# Patient Record
Sex: Male | Born: 1990 | Race: Black or African American | Hispanic: No | Marital: Single | Smoking: Current every day smoker
Health system: Southern US, Community
[De-identification: ages and names within clinical notes are randomized; demographics above are authoritative.]

## PROBLEM LIST (undated history)

## (undated) DIAGNOSIS — I480 Paroxysmal atrial fibrillation: Secondary | ICD-10-CM

## (undated) DIAGNOSIS — G4733 Obstructive sleep apnea (adult) (pediatric): Secondary | ICD-10-CM

## (undated) DIAGNOSIS — F1490 Cocaine use, unspecified, uncomplicated: Secondary | ICD-10-CM

## (undated) DIAGNOSIS — I739 Peripheral vascular disease, unspecified: Secondary | ICD-10-CM

## (undated) DIAGNOSIS — N1832 Chronic kidney disease, stage 3b: Secondary | ICD-10-CM

## (undated) DIAGNOSIS — R600 Localized edema: Secondary | ICD-10-CM

## (undated) DIAGNOSIS — I5032 Chronic diastolic (congestive) heart failure: Secondary | ICD-10-CM

## (undated) DIAGNOSIS — L0390 Cellulitis, unspecified: Secondary | ICD-10-CM

## (undated) DIAGNOSIS — I10 Essential (primary) hypertension: Secondary | ICD-10-CM

## (undated) DIAGNOSIS — E669 Obesity, unspecified: Secondary | ICD-10-CM

## (undated) DIAGNOSIS — I890 Lymphedema, not elsewhere classified: Secondary | ICD-10-CM

## (undated) HISTORY — DX: Lymphedema, not elsewhere classified: I89.0

## (undated) HISTORY — DX: Chronic kidney disease, stage 3b: N18.32

## (undated) HISTORY — DX: Obstructive sleep apnea (adult) (pediatric): G47.33

## (undated) HISTORY — DX: Cellulitis, unspecified: L03.90

## (undated) HISTORY — DX: Chronic diastolic (congestive) heart failure: I50.32

## (undated) HISTORY — DX: Localized edema: R60.0

## (undated) HISTORY — DX: Paroxysmal atrial fibrillation: I48.0

## (undated) HISTORY — DX: Cocaine use, unspecified, uncomplicated: F14.90

---
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
## 2008-10-14 ENCOUNTER — Emergency Department (HOSPITAL_COMMUNITY): Admission: EM | Admit: 2008-10-14 | Discharge: 2008-10-14 | Payer: Self-pay | Admitting: Emergency Medicine

## 2008-10-15 ENCOUNTER — Emergency Department (HOSPITAL_COMMUNITY): Admission: EM | Admit: 2008-10-15 | Discharge: 2008-10-15 | Payer: Self-pay | Admitting: Emergency Medicine

## 2009-02-20 ENCOUNTER — Emergency Department (HOSPITAL_COMMUNITY): Admission: EM | Admit: 2009-02-20 | Discharge: 2009-02-20 | Payer: Self-pay | Admitting: Emergency Medicine

## 2010-05-18 LAB — URINALYSIS, ROUTINE W REFLEX MICROSCOPIC
Glucose, UA: NEGATIVE mg/dL
Protein, ur: NEGATIVE mg/dL
pH: 6 (ref 5.0–8.0)

## 2010-05-18 LAB — URINE MICROSCOPIC-ADD ON

## 2012-07-03 ENCOUNTER — Emergency Department (HOSPITAL_COMMUNITY): Payer: Self-pay

## 2012-07-03 ENCOUNTER — Encounter (HOSPITAL_COMMUNITY): Payer: Self-pay | Admitting: Emergency Medicine

## 2012-07-03 ENCOUNTER — Emergency Department (HOSPITAL_COMMUNITY)
Admission: EM | Admit: 2012-07-03 | Discharge: 2012-07-03 | Disposition: A | Discharge status: Home / Self Care | Payer: No Typology Code available for payment source | Attending: Emergency Medicine | Admitting: Emergency Medicine

## 2012-07-03 DIAGNOSIS — S99929A Unspecified injury of unspecified foot, initial encounter: Secondary | ICD-10-CM | POA: Insufficient documentation

## 2012-07-03 DIAGNOSIS — S6990XA Unspecified injury of unspecified wrist, hand and finger(s), initial encounter: Secondary | ICD-10-CM | POA: Insufficient documentation

## 2012-07-03 DIAGNOSIS — S0990XA Unspecified injury of head, initial encounter: Secondary | ICD-10-CM

## 2012-07-03 DIAGNOSIS — S0591XA Unspecified injury of right eye and orbit, initial encounter: Secondary | ICD-10-CM

## 2012-07-03 DIAGNOSIS — S59909A Unspecified injury of unspecified elbow, initial encounter: Secondary | ICD-10-CM | POA: Insufficient documentation

## 2012-07-03 DIAGNOSIS — Y9389 Activity, other specified: Secondary | ICD-10-CM | POA: Insufficient documentation

## 2012-07-03 DIAGNOSIS — S058X9A Other injuries of unspecified eye and orbit, initial encounter: Secondary | ICD-10-CM | POA: Insufficient documentation

## 2012-07-03 DIAGNOSIS — S8990XA Unspecified injury of unspecified lower leg, initial encounter: Secondary | ICD-10-CM | POA: Insufficient documentation

## 2012-07-03 DIAGNOSIS — S0531XA Ocular laceration without prolapse or loss of intraocular tissue, right eye, initial encounter: Secondary | ICD-10-CM

## 2012-07-03 DIAGNOSIS — S0510XA Contusion of eyeball and orbital tissues, unspecified eye, initial encounter: Secondary | ICD-10-CM | POA: Insufficient documentation

## 2012-07-03 MED ORDER — ACETAMINOPHEN 325 MG PO TABS: 650.0000 mg | ORAL_TABLET | Freq: Once | ORAL | Status: DC

## 2012-07-03 MED ORDER — HYDROCODONE-ACETAMINOPHEN 5-325 MG PO TABS: 1.0000 | ORAL_TABLET | Freq: Four times a day (QID) | ORAL | Status: DC | PRN

## 2012-07-03 MED ORDER — HYDROMORPHONE HCL PF 1 MG/ML IJ SOLN
1.0000 mg | Freq: Once | INTRAMUSCULAR | Status: AC
  Administered 2012-07-03: 1 mg via INTRAMUSCULAR
  Filled 2012-07-03: qty 1

## 2012-07-03 MED ORDER — TETANUS-DIPHTH-ACELL PERTUSSIS 5-2.5-18.5 LF-MCG/0.5 IM SUSP
0.5000 mL | Freq: Once | INTRAMUSCULAR | Status: DC
  Filled 2012-07-03: qty 0.5
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
## 2012-07-03 NOTE — ED Notes (Signed)
Patient again refused Tylenol

## 2012-07-03 NOTE — ED Notes (Signed)
Patient transported to X-ray 
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
## 2012-07-04 NOTE — ED Provider Notes (Signed)
I saw and evaluated the patient, reviewed the resident's note and I agree with the findings and plan.  see my attached note  Vida Roller, MD 07/04/12 [phone]

## 2012-07-12 ENCOUNTER — Encounter (HOSPITAL_COMMUNITY): Payer: Self-pay | Admitting: Nurse Practitioner

## 2012-07-12 ENCOUNTER — Emergency Department (HOSPITAL_COMMUNITY): Payer: Self-pay

## 2012-07-12 ENCOUNTER — Emergency Department (HOSPITAL_COMMUNITY)
Admission: EM | Admit: 2012-07-12 | Discharge: 2012-07-12 | Disposition: A | Discharge status: Home / Self Care | Payer: Self-pay | Attending: Emergency Medicine | Admitting: Emergency Medicine

## 2012-07-12 DIAGNOSIS — F17200 Nicotine dependence, unspecified, uncomplicated: Secondary | ICD-10-CM | POA: Insufficient documentation

## 2012-07-12 DIAGNOSIS — Z79899 Other long term (current) drug therapy: Secondary | ICD-10-CM | POA: Insufficient documentation

## 2012-07-12 DIAGNOSIS — R0602 Shortness of breath: Secondary | ICD-10-CM | POA: Insufficient documentation

## 2012-07-12 DIAGNOSIS — R252 Cramp and spasm: Secondary | ICD-10-CM | POA: Insufficient documentation

## 2012-07-12 LAB — CBC
HCT: 42.8 % (ref 39.0–52.0)
MCV: 83.6 fL (ref 78.0–100.0)
RDW: 13.7 % (ref 11.5–15.5)
WBC: 4.1 10*3/uL (ref 4.0–10.5)

## 2012-07-12 LAB — BASIC METABOLIC PANEL
BUN: 12 mg/dL (ref 6–23)
CO2: 25 mEq/L (ref 19–32)
Chloride: 103 mEq/L (ref 96–112)
Creatinine, Ser: 0.9 mg/dL (ref 0.50–1.35)
GFR calc Af Amer: >90 mL/min (ref >90)
Glucose, Bld: 90 mg/dL (ref 70–99)

## 2012-07-12 MED ORDER — IBUPROFEN 800 MG PO TABS: 800.0000 mg | ORAL_TABLET | Freq: Three times a day (TID) | ORAL | Status: DC

## 2012-07-12 MED ORDER — MORPHINE SULFATE 4 MG/ML IJ SOLN
4.0000 mg | Freq: Once | INTRAMUSCULAR | Status: AC
  Administered 2012-07-12: 4 mg via INTRAVENOUS
  Filled 2012-07-12: qty 1

## 2012-07-12 MED ORDER — METHOCARBAMOL 500 MG PO TABS: 500.0000 mg | ORAL_TABLET | Freq: Two times a day (BID) | ORAL | Status: DC

## 2012-07-12 MED ORDER — HYDROCODONE-ACETAMINOPHEN 5-325 MG PO TABS: 2.0000 | ORAL_TABLET | Freq: Four times a day (QID) | ORAL | Status: DC | PRN
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
## 2012-07-12 NOTE — ED Provider Notes (Signed)
Medical screening examination/treatment/procedure(s) were performed by non-physician practitioner and as supervising physician I was immediately available for consultation/collaboration.  Lunna Vogelgesang M Perian Tedder, MD 07/12/12 1624 

## 2013-08-18 ENCOUNTER — Encounter (HOSPITAL_COMMUNITY): Payer: Self-pay | Admitting: Emergency Medicine

## 2013-08-18 ENCOUNTER — Emergency Department (HOSPITAL_COMMUNITY)
Admission: EM | Admit: 2013-08-18 | Discharge: 2013-08-18 | Disposition: A | Discharge status: Home / Self Care | Payer: Self-pay | Attending: Emergency Medicine | Admitting: Emergency Medicine

## 2013-08-18 DIAGNOSIS — IMO0002 Reserved for concepts with insufficient information to code with codable children: Secondary | ICD-10-CM | POA: Insufficient documentation

## 2013-08-18 DIAGNOSIS — M546 Pain in thoracic spine: Secondary | ICD-10-CM

## 2013-08-18 DIAGNOSIS — Y929 Unspecified place or not applicable: Secondary | ICD-10-CM | POA: Insufficient documentation

## 2013-08-18 DIAGNOSIS — X500XXA Overexertion from strenuous movement or load, initial encounter: Secondary | ICD-10-CM | POA: Insufficient documentation

## 2013-08-18 DIAGNOSIS — Y939 Activity, unspecified: Secondary | ICD-10-CM | POA: Insufficient documentation

## 2013-08-18 DIAGNOSIS — F17200 Nicotine dependence, unspecified, uncomplicated: Secondary | ICD-10-CM | POA: Insufficient documentation

## 2013-08-18 DIAGNOSIS — Y990 Civilian activity done for income or pay: Secondary | ICD-10-CM | POA: Insufficient documentation

## 2013-08-18 MED ORDER — CYCLOBENZAPRINE HCL 10 MG PO TABS: 10.0000 mg | ORAL_TABLET | Freq: Two times a day (BID) | ORAL | Status: DC | PRN

## 2013-08-18 MED ORDER — CYCLOBENZAPRINE HCL 10 MG PO TABS
10.0000 mg | ORAL_TABLET | Freq: Once | ORAL | Status: AC
  Administered 2013-08-18: 10 mg via ORAL
  Filled 2013-08-18: qty 1

## 2013-08-18 NOTE — Discharge Instructions (Signed)
Back Pain, Adult °Low back pain is very common. About 1 in 5 people have back pain. The cause of low back pain is rarely dangerous. The pain often gets better over time. About half of people with a sudden onset of back pain feel better in just 2 weeks. About 8 in 10 people feel better by 6 weeks.  °CAUSES °Some common causes of back pain include: °· Strain of the muscles or ligaments supporting the spine. °· Wear and tear (degeneration) of the spinal discs. °· Arthritis. °· Direct injury to the back. °DIAGNOSIS °Most of the time, the direct cause of low back pain is not known. However, back pain can be treated effectively even when the exact cause of the pain is unknown. Answering your caregiver's questions about your overall health and symptoms is one of the most accurate ways to make sure the cause of your pain is not dangerous. If your caregiver needs more information, he or she may order lab work or imaging tests (X-rays or MRIs). However, even if imaging tests show changes in your back, this usually does not require surgery. °HOME CARE INSTRUCTIONS °For many people, back pain returns. Since low back pain is rarely dangerous, it is often a condition that people can learn to manage on their own.  °· Remain active. It is stressful on the back to sit or stand in one place. Do not sit, drive, or stand in one place for more than 30 minutes at a time. Take short walks on level surfaces as soon as pain allows. Try to increase the length of time you walk each day. °· Do not stay in bed. Resting more than 1 or 2 days can delay your recovery. °· Do not avoid exercise or work. Your body is made to move. It is not dangerous to be active, even though your back may hurt. Your back will likely heal faster if you return to being active before your pain is gone. °· Pay attention to your body when you  bend and lift. Many people have less discomfort when lifting if they bend their knees, keep the load close to their bodies, and  avoid twisting. Often, the most comfortable positions are those that put less stress on your recovering back. °· Find a comfortable position to sleep. Use a firm mattress and lie on your side with your knees slightly bent. If you lie on your back, put a pillow under your knees. °· Only take over-the-counter or prescription medicines as directed by your caregiver. Over-the-counter medicines to reduce pain and inflammation are often the most helpful. Your caregiver may prescribe muscle relaxant drugs. These medicines help dull your pain so you can more quickly return to your normal activities and healthy exercise. °· Put ice on the injured area. °¨ Put ice in a plastic bag. °¨ Place a towel between your skin and the bag. °¨ Leave the ice on for 15-20 minutes, 03-04 times a day for the first 2 to 3 days. After that, ice and heat may be alternated to reduce pain and spasms. °· Ask your caregiver about trying back exercises and gentle massage. This may be of some benefit. °· Avoid feeling anxious or stressed. Stress increases muscle tension and can worsen back pain. It is important to recognize when you are anxious or stressed and learn ways to manage it. Exercise is a great option. °SEEK MEDICAL CARE IF: °· You have pain that is not relieved with rest or medicine. °· You have pain that does not improve in 1 week. °· You have new symptoms. °· You are generally not feeling well. °SEEK   IMMEDIATE MEDICAL CARE IF:   You have pain that radiates from your back into your legs.  You develop new bowel or bladder control problems.  You have unusual weakness or numbness in your arms or legs.  You develop nausea or vomiting.  You develop abdominal pain.  You feel faint. Document Released: 01/28/2005 Document Revised: 07/30/2011 Document Reviewed: 06/18/2010 [facility] Patient Information 2015 [place], [place]. This information is not intended to replace advice given to you by your health care provider. Make sure you  discuss any questions you have with your health care provider.  Back Exercises These exercises may help you when beginning to rehabilitate your injury. Your symptoms may resolve with or without further involvement from your physician, physical therapist or athletic trainer. While completing these exercises, remember:   Restoring tissue flexibility helps normal motion to return to the joints. This allows healthier, less painful movement and activity.  An effective stretch should be held for at least 30 seconds.  A stretch should never be painful. You should only feel a gentle lengthening or release in the stretched tissue. STRETCH - Extension, Prone on Elbows   Lie on your stomach on the floor, a bed will be too soft. Place your palms about shoulder width apart and at the height of your head.  Place your elbows under your shoulders. If this is too painful, stack pillows under your chest.  Allow your body to relax so that your hips drop lower and make contact more completely with the floor.  Hold this position for __________ seconds.  Slowly return to lying flat on the floor. Repeat __________ times. Complete this exercise __________ times per day.  RANGE OF MOTION - Extension, Prone Press Ups   Lie on your stomach on the floor, a bed will be too soft. Place your palms about shoulder width apart and at the height of your head.  Keeping your back as relaxed as possible, slowly straighten your elbows while keeping your hips on the floor. You may adjust the placement of your hands to maximize your comfort. As you gain motion, your hands will come more underneath your shoulders.  Hold this position __________ seconds.  Slowly return to lying flat on the floor. Repeat __________ times. Complete this exercise __________ times per day.  RANGE OF MOTION- Quadruped, Neutral Spine   Assume a hands and knees position on a firm surface. Keep your hands under your shoulders and your knees under  your hips. You may place padding under your knees for comfort.  Drop your head and point your tail bone toward the ground below you. This will round out your low back like an angry cat. Hold this position for __________ seconds.  Slowly lift your head and release your tail bone so that your back sags into a large arch, like an old horse.  Hold this position for __________ seconds.  Repeat this until you feel limber in your low back.  Now, find your "sweet spot." This will be the most comfortable position somewhere between the two previous positions. This is your neutral spine. Once you have found this position, tense your stomach muscles to support your low back.  Hold this position for __________ seconds. Repeat __________ times. Complete this exercise __________ times per day.  STRETCH - Flexion, Single Knee to Chest   Lie on a firm bed or floor with both legs extended in front of you.  Keeping one leg in contact with the floor, bring your opposite knee to your  chest. Hold your leg in place by either grabbing behind your thigh or at your knee. °· Pull until you feel a gentle stretch in your low back. Hold __________ seconds. °· Slowly release your grasp and repeat the exercise with the opposite side. °Repeat __________ times. Complete this exercise __________ times per day.  °STRETCH - Hamstrings, Standing °· Stand or sit and extend your right / left leg, placing your foot on a chair or foot stool °· Keeping a slight arch in your low back and your hips straight forward. °· Lead with your chest and lean forward at the waist until you feel a gentle stretch in the back of your right / left knee or thigh. (When done correctly, this exercise requires leaning only a small distance.) °· Hold this position for __________ seconds. °Repeat __________ times. Complete this stretch __________ times per day. °STRENGTHENING - Deep Abdominals, Pelvic Tilt  °· Lie on a firm bed or floor. Keeping your legs in  front of you, bend your knees so they are both pointed toward the ceiling and your feet are flat on the floor. °· Tense your lower abdominal muscles to press your low back into the floor. This motion will rotate your pelvis so that your tail bone is scooping upwards rather than pointing at your feet or into the floor. °· With a gentle tension and even breathing, hold this position for __________ seconds. °Repeat __________ times. Complete this exercise __________ times per day.  °STRENGTHENING - Abdominals, Crunches  °· Lie on a firm bed or floor. Keeping your legs in front of you, bend your knees so they are both pointed toward the ceiling and your feet are flat on the floor. Cross your arms over your chest. °· Slightly tip your chin down without bending your neck. °· Tense your abdominals and slowly lift your trunk high enough to just clear your shoulder blades. Lifting higher can put excessive stress on the low back and does not further strengthen your abdominal muscles. °· Control your return to the starting position. °Repeat __________ times. Complete this exercise __________ times per day.  °STRENGTHENING - Quadruped, Opposite UE/LE Lift  °· Assume a hands and knees position on a firm surface. Keep your hands under your shoulders and your knees under your hips. You may place padding under your knees for comfort. °· Find your neutral spine and gently tense your abdominal muscles so that you can maintain this position. Your shoulders and hips should form a rectangle that is parallel with the floor and is not twisted. °· Keeping your trunk steady, lift your right hand no higher than your shoulder and then your left leg no higher than your hip. Make sure you are not holding your breath. Hold this position __________ seconds. °· Continuing to keep your abdominal muscles tense and your back steady, slowly return to your starting position. Repeat with the opposite arm and leg. °Repeat __________ times. Complete this  exercise __________ times per day. °Document Released: 02/15/2005 Document Revised: 04/22/2011 Document Reviewed: 05/12/2008 °Exit[employer]® Patient Information ©2015 [employer]. This information is not intended to replace advice given to you by your health care provider. Make sure you discuss any questions you have with your health care provider. ° °

## 2013-08-18 NOTE — ED Provider Notes (Signed)
CSN: [id number]     Arrival date & time 08/18/13  1610 History  This chart was scribed for Junious SilkHannah Hooper Petteway, PA-C working with Nelia Shiobert L Beaton, MD by Evon Slackerrance Branch, ED Scribe. This patient was seen in room TR10C/TR10C and the patient's care was started at 5:11 PM.      Chief Complaint  Patient presents with  . Back Pain   Patient is a [age] y.o. male presenting with back pain. The history is provided by the patient. No language interpreter was used.  Back Pain Associated symptoms: no bladder incontinence, no bowel incontinence and no dysuria    HPI Comments: Jared Mccoy is a 23 y.o. male who presents to the Emergency Department complaining of back pain onset 11 AM today prior to arrival. He states the he may have pulled a muscle while on the ladder today at work reaching above his head. He states the pain is worse with movement. He states that he has tried taking aleve and tylenol with no relief. He denies nausea, vomiting, urine/ bowel incontinence, dysuria, frequency or other related symptoms.  He states that he is currently a 1ppd cigarette smoker   History reviewed. No pertinent past medical history. No past surgical history on file. No family history on file. History  Substance Use Topics  . Smoking status: Current Every Day Smoker -- 1.00 packs/day    Types: Cigarettes  . Smokeless tobacco: Not on file  . Alcohol Use: Yes     Comment: rare    Review of Systems  Gastrointestinal: Negative for nausea, vomiting and bowel incontinence.  Genitourinary: Negative for bladder incontinence, dysuria and frequency.  Musculoskeletal: Positive for back pain.  All other systems reviewed and are negative.   Allergies  Review of patient's allergies indicates no known allergies.  Home Medications   Prior to Admission medications   Medication Sig Start Date End Date Taking? Authorizing Provider  naproxen sodium (ANAPROX) 220 MG tablet Take 660 mg by mouth daily as needed (for pain).    Yes Historical Provider, MD   Triage Vitals: BP 164/89  Pulse 82  Temp(Src) 98.1 F (36.7 C) (Oral)  Resp 18  Wt 340 lb (154.223 kg)  SpO2 97%  Physical Exam  Nursing note and vitals reviewed. Constitutional: He is oriented to person, place, and time. He appears well-developed and well-nourished. No distress.  HENT:  Head: Normocephalic and atraumatic.  Right Ear: External ear normal.  Left Ear: External ear normal.  Nose: Nose normal.  Eyes: Conjunctivae are normal.  Neck: Normal range of motion. No tracheal deviation present.  Cardiovascular: Normal rate, regular rhythm and normal heart sounds.   Pulses:      Posterior tibial pulses are 2+ on the right side, and 2+ on the left side.  Pulmonary/Chest: Effort normal and breath sounds normal. No stridor.  Abdominal: Soft. He exhibits no distension. There is no tenderness.  Musculoskeletal: Normal range of motion.  Tenderness to palpation over left mid back, no bony tenderness, deformity or step off. No erythema or rashes. Strength 5/5 in all extremities.  Neurological: He is alert and oriented to person, place, and time.  Skin: Skin is warm and dry. He is not diaphoretic.  Psychiatric: He has a normal mood and affect. His behavior is normal.    ED Course  Procedures (including critical care time) DIAGNOSTIC STUDIES: Oxygen Saturation is 97% on RA, normal by my interpretation.    COORDINATION OF CARE:    Labs Review Labs Reviewed - No  data to display  Imaging Review No results found.   EKG Interpretation None      MDM   Final diagnoses:  Left-sided thoracic back pain   Patient with back pain.  No neurological deficits and normal neuro exam.  Patient can walk but states is painful.  No loss of bowel or bladder control.  No concern for cauda equina.  No fever, night sweats, weight loss, h/o cancer, IVDU.  RICE protocol and pain medicine indicated and discussed with patient.    I personally performed the  services described in this documentation, which was scribed in my presence. The recorded information has been reviewed and is accurate.      Mora BellmanHannah S Jassmin Kemmerer, PA-C 08/20/13 [phone]

## 2013-08-18 NOTE — ED Notes (Signed)
Pt reports was on ladder reaching around 1030 this am. Sudden pain to middle-upper back.

## 2013-08-21 NOTE — ED Provider Notes (Signed)
Medical screening examination/treatment/procedure(s) were performed by non-physician practitioner and as supervising physician I was immediately available for consultation/collaboration.   Nelia Shiobert L Dyanna Seiter, MD 08/21/13 [id number]

## 2014-05-20 ENCOUNTER — Encounter (HOSPITAL_COMMUNITY): Payer: Self-pay | Admitting: Emergency Medicine

## 2014-05-20 ENCOUNTER — Emergency Department (HOSPITAL_COMMUNITY)
Admission: EM | Admit: 2014-05-20 | Discharge: 2014-05-20 | Disposition: A | Discharge status: Home / Self Care | Payer: Self-pay | Attending: Emergency Medicine | Admitting: Emergency Medicine

## 2014-05-20 DIAGNOSIS — J029 Acute pharyngitis, unspecified: Secondary | ICD-10-CM | POA: Insufficient documentation

## 2014-05-20 DIAGNOSIS — Z720 Tobacco use: Secondary | ICD-10-CM | POA: Insufficient documentation

## 2014-05-20 LAB — RAPID STREP SCREEN (MED CTR MEBANE ONLY): STREPTOCOCCUS, GROUP A SCREEN (DIRECT): NEGATIVE

## 2014-05-20 NOTE — ED Notes (Signed)
C/o sore throat since yesterday. Denies fever, chills, or cough.

## 2014-05-20 NOTE — ED Provider Notes (Signed)
CSN: [id number]     Arrival date & time 05/20/14  1322 History  This chart was scribed for non-physician practitioner, Jared LowerVrinda Pearl Bents, NP working with Jared JesterKathleen McManus, DO by Jared Mccoy, ED scribe. This patient was seen in room TR07C/TR07C and the patient's care was started at 1:35 PM   Chief Complaint  Patient presents with  . Sore Throat   The history is provided by the patient. No language interpreter was used.    HPI Comments: Jared Mccoy is a 24 y.o. male who presents to the Emergency Department complaining of constant, moderate sore throat that started last night. He states pain becomes worse with swallowing. Pt denies sick contact. He also denies fever, rhinorrhea, cough and congestion as associated symptoms.   No past medical history on file. No past surgical history on file. No family history on file. History  Substance Use Topics  . Smoking status: Current Every Day Smoker -- 1.00 packs/day    Types: Cigarettes  . Smokeless tobacco: Not on file  . Alcohol Use: Yes     Comment: rare    Review of Systems  Constitutional: Negative for fever.  HENT: Positive for sore throat. Negative for congestion and rhinorrhea.   Respiratory: Negative for cough.   All other systems reviewed and are negative.   Allergies  Review of patient's allergies indicates no known allergies.  Home Medications   Prior to Admission medications   Medication Sig Start Date End Date Taking? Authorizing Provider  cyclobenzaprine (FLEXERIL) 10 MG tablet Take 1 tablet (10 mg total) by mouth 2 (two) times daily as needed for muscle spasms. 08/18/13   Junious SilkHannah Merrell, PA-C  naproxen sodium (ANAPROX) 220 MG tablet Take 660 mg by mouth daily as needed (for pain).    Historical Provider, MD   BP 147/84 mmHg  Pulse 90  Temp(Src) 97.9 F (36.6 C) (Oral)  Resp 16  SpO2 97% Physical Exam  Constitutional: He appears well-developed and well-nourished. No distress.  HENT:  Head: Normocephalic and  atraumatic.  Right Ear: External ear normal.  Left Ear: External ear normal.  Mouth/Throat: Posterior oropharyngeal erythema present.  Eyes: Conjunctivae and EOM are normal.  Neck: Neck supple. No tracheal deviation present.  Cardiovascular: Normal rate.   Pulmonary/Chest: Effort normal. No respiratory distress.  Skin: Skin is warm and dry.  Psychiatric: He has a normal mood and affect. His behavior is normal.  Nursing note and vitals reviewed.   ED Course  Procedures   DIAGNOSTIC STUDIES: Oxygen Saturation is 97% on RA, normal by my interpretation.    COORDINATION OF CARE: 1:36 PM Discussed treatment plan with pt which includes a strep test. Pt agreed to plan.   Labs Review Labs Reviewed - No data to display  Imaging Review No results found.   EKG Interpretation None      MDM   Final diagnoses:  Pharyngitis    Strep negative. Likely viral in nature. Pt is okay to follow up with pcp  I personally performed the services described in this documentation, which was scribed in my presence. The recorded information has been reviewed and is accurate.    Jared LowerVrinda Darica Goren, NP 05/20/14 1430  Jared JesterKathleen McManus, DO 05/21/14 1610

## 2014-05-20 NOTE — Discharge Instructions (Signed)

## 2014-05-23 LAB — CULTURE, GROUP A STREP

## 2014-08-27 ENCOUNTER — Emergency Department (HOSPITAL_COMMUNITY)
Admission: EM | Admit: 2014-08-27 | Discharge: 2014-08-27 | Disposition: A | Discharge status: Home / Self Care | Payer: Self-pay | Attending: Emergency Medicine | Admitting: Emergency Medicine

## 2014-08-27 ENCOUNTER — Encounter (HOSPITAL_COMMUNITY): Payer: Self-pay | Admitting: *Deleted

## 2014-08-27 DIAGNOSIS — L97929 Non-pressure chronic ulcer of unspecified part of left lower leg with unspecified severity: Secondary | ICD-10-CM | POA: Insufficient documentation

## 2014-08-27 DIAGNOSIS — L97919 Non-pressure chronic ulcer of unspecified part of right lower leg with unspecified severity: Secondary | ICD-10-CM | POA: Insufficient documentation

## 2014-08-27 DIAGNOSIS — M79605 Pain in left leg: Secondary | ICD-10-CM | POA: Insufficient documentation

## 2014-08-27 DIAGNOSIS — I83029 Varicose veins of left lower extremity with ulcer of unspecified site: Secondary | ICD-10-CM | POA: Insufficient documentation

## 2014-08-27 DIAGNOSIS — Z720 Tobacco use: Secondary | ICD-10-CM | POA: Insufficient documentation

## 2014-08-27 DIAGNOSIS — R600 Localized edema: Secondary | ICD-10-CM | POA: Insufficient documentation

## 2014-08-27 DIAGNOSIS — I872 Venous insufficiency (chronic) (peripheral): Secondary | ICD-10-CM

## 2014-08-27 DIAGNOSIS — I83019 Varicose veins of right lower extremity with ulcer of unspecified site: Secondary | ICD-10-CM | POA: Insufficient documentation

## 2014-08-27 LAB — BASIC METABOLIC PANEL
ANION GAP: 7 (ref 5–15)
BUN: 13 mg/dL (ref 6–20)
CALCIUM: 8.8 mg/dL — AB (ref 8.9–10.3)
CHLORIDE: 107 mmol/L (ref 101–111)
CO2: 25 mmol/L (ref 22–32)
Creatinine, Ser: 1.05 mg/dL (ref 0.61–1.24)
GLUCOSE: 131 mg/dL — AB (ref 65–99)
POTASSIUM: 3.5 mmol/L (ref 3.5–5.1)
Sodium: 139 mmol/L (ref 135–145)

## 2014-08-27 LAB — CBC WITH DIFFERENTIAL/PLATELET
BASOS PCT: 0 % (ref 0–1)
Basophils Absolute: 0 10*3/uL (ref 0.0–0.1)
EOS ABS: 0.2 10*3/uL (ref 0.0–0.7)
Eosinophils Relative: 2 % (ref 0–5)
HCT: 38.8 % — ABNORMAL LOW (ref 39.0–52.0)
Hemoglobin: 12.7 g/dL — ABNORMAL LOW (ref 13.0–17.0)
Lymphocytes Relative: 43 % (ref 12–46)
Lymphs Abs: 3.1 10*3/uL (ref 0.7–4.0)
MCH: 27 pg (ref 26.0–34.0)
MCHC: 32.7 g/dL (ref 30.0–36.0)
MCV: 82.6 fL (ref 78.0–100.0)
Monocytes Absolute: 0.5 10*3/uL (ref 0.1–1.0)
Monocytes Relative: 6 % (ref 3–12)
NEUTROS ABS: 3.5 10*3/uL (ref 1.7–7.7)
NEUTROS PCT: 49 % (ref 43–77)
PLATELETS: 182 10*3/uL (ref 150–400)
RBC: 4.7 MIL/uL (ref 4.22–5.81)
RDW: 15.5 % (ref 11.5–15.5)
WBC: 7.2 10*3/uL (ref 4.0–10.5)

## 2014-08-27 MED ORDER — FUROSEMIDE 10 MG/ML IJ SOLN
40.0000 mg | Freq: Once | INTRAMUSCULAR | Status: AC
  Administered 2014-08-27: 40 mg via INTRAMUSCULAR
  Filled 2014-08-27: qty 4

## 2014-08-27 MED ORDER — FUROSEMIDE 20 MG PO TABS: 20.0000 mg | ORAL_TABLET | Freq: Every day | ORAL | Status: DC

## 2014-08-27 NOTE — ED Notes (Signed)
Pt left with all belongings and refused wheelchair. 

## 2014-08-27 NOTE — Discharge Instructions (Signed)
Edema °Edema is an abnormal buildup of fluids in your body tissues. Edema is somewhat dependent on gravity to pull the fluid to the lowest place in your body. That makes the condition more common in the legs and thighs (lower extremities). Painless swelling of the feet and ankles is common and becomes more likely as you get older. It is also common in looser tissues, like around your eyes.  °When the affected area is squeezed, the fluid may move out of that spot and leave a dent for a few moments. This dent is called pitting.  °CAUSES  °There are many possible causes of edema. Eating too much salt and being on your feet or sitting for a long time can cause edema in your legs and ankles. Hot weather may make edema worse. Common medical causes of edema include: °· Heart failure. °· Liver disease. °· Kidney disease. °· Weak blood vessels in your legs. °· Cancer. °· An injury. °· Pregnancy. °· Some medications. °· Obesity.  °SYMPTOMS  °Edema is usually painless. Your skin may look swollen or shiny.  °DIAGNOSIS  °Your health care provider may be able to diagnose edema by asking about your medical history and doing a physical exam. You may need to have tests such as X-rays, an electrocardiogram, or blood tests to check for medical conditions that may cause edema.  °TREATMENT  °Edema treatment depends on the cause. If you have heart, liver, or kidney disease, you need the treatment appropriate for these conditions. General treatment may include: °· Elevation of the affected body part above the level of your heart. °· Compression of the affected body part. Pressure from elastic bandages or support stockings squeezes the tissues and forces fluid back into the blood vessels. This keeps fluid from entering the tissues. °· Restriction of fluid and salt intake. °· Use of a water pill (diuretic). These medications are appropriate only for some types of edema. They pull fluid out of your body and make you urinate more often. This  gets rid of fluid and reduces swelling, but diuretics can have side effects. Only use diuretics as directed by your health care provider. °HOME CARE INSTRUCTIONS  °· Keep the affected body part above the level of your heart when you are lying down.   °· Do not sit still or stand for prolonged periods.   °· Do not put anything directly under your knees when lying down. °· Do not wear constricting clothing or garters on your upper legs.   °· Exercise your legs to work the fluid back into your blood vessels. This may help the swelling go down.   °· Wear elastic bandages or support stockings to reduce ankle swelling as directed by your health care provider.   °· Eat a low-salt diet to reduce fluid if your health care provider recommends it.   °· Only take medicines as directed by your health care provider.  °SEEK MEDICAL CARE IF:  °· Your edema is not responding to treatment. °· You have heart, liver, or kidney disease and notice symptoms of edema. °· You have edema in your legs that does not improve after elevating them.   °· You have sudden and unexplained weight gain. °SEEK IMMEDIATE MEDICAL CARE IF:  °· You develop shortness of breath or chest pain.   °· You cannot breathe when you lie down. °· You develop pain, redness, or warmth in the swollen areas.   °· You have heart, liver, or kidney disease and suddenly get edema. °· You have a fever and your symptoms suddenly get worse. °MAKE SURE YOU:  °·   Understand these instructions.  Will watch your condition.  Will get help right away if you are not doing well or get worse. Document Released: 01/28/2005 Document Revised: 06/14/2013 Document Reviewed: 11/20/2012 [facility] Patient Information 2015 [place], [place]. This information is not intended to replace advice given to you by your health care provider. Make sure you discuss any questions you have with your health care provider.  Compression Stockings Compression stockings are elastic stockings that "compress"  your legs. This helps to increase blood flow, decrease swelling, and reduces the chance of getting blood clots in your lower legs. Compression stockings are used:  After surgery.  If you have a history of poor circulation.  If you are prone to blood clots.  If you have varicose veins.  If you sit or are bedridden for long periods of time. WEARING COMPRESSION STOCKINGS  Your compression stockings should be worn as instructed by your caregiver.  Wearing the correct stocking size is important. Your caregiver can help measure and fit you to the correct size.  When wearing your stockings, do not allow the stockings to bunch up. This is especially important around your toes or behind your knees. Keep the stockings as smooth as possible.  Do not roll the stockings downward and leave them rolled down. This can form a restrictive band around your legs and can decrease blood flow.  The stockings should be removed once a day for 1 hour or as instructed by your caregiver. When the stockings are taken off, inspect your legs and feet. Look for:  Open sores.  Red spots.  Puffy areas (swelling).  Anything that does not seem normal. IMPORTANT INFORMATION ABOUT COMPRESSION STOCKINGS  The compression stockings should be clean, dry, and in good condition before you put them on.  Do not put lotion on your legs or feet. This makes it harder to put the stockings on.  Change your stockings immediately if they become wet or soiled.  Do not wear stockings that are ripped or torn.  You may hand-wash or put your stockings in the washing machine. Use cold or warm water with mild detergent. Do not bleach your stockings. They may be air-dried or dried in the dryer on low heat.  If you have pain or have a feeling of "pins and needles" in your feet or legs, you may be wearing stockings that are too tight. Call your caregiver right away. SEEK IMMEDIATE MEDICAL CARE IF:   You have numbness or tingling in  your lower legs that does not get better quickly after the stockings are removed.  Your toes or feet become cold and blue.  You develop open sores or have red spots on your legs that do not go away. MAKE SURE YOU:   Understand these instructions.  Will watch your condition.  Will get help right away if you are not doing well or get worse. Document Released: 11/25/2008 Document Revised: 04/22/2011 Document Reviewed: 11/25/2008 [facility] Patient Information 2015 [place], [place]. This information is not intended to replace advice given to you by your health care provider. Make sure you discuss any questions you have with your health care provider.  Stasis Dermatitis Stasis dermatitis occurs when veins lose the ability to pump blood back to the heart (poor venous circulation). It causes a reddish-purple to brownish scaly, itchy rash on the legs. The rash comes from pooling of blood (stasis). CAUSES  This occurs because the veins do not work very well anymore or because pressure may be increased in  the veins due to other conditions. With blood pooling, the increased pressure in the tiny blood vessels (capillaries) causes fluid to leak out of the capillaries into the tissue. The extra fluid makes it harder for the blood to feed the cells and get rid of waste products. SYMPTOMS  Stasis dermatitis appears as red, scaly, itchy patches on the legs. A yellowish or light brown discoloration is also present. Due to scratching or other injury, these patches can become an ulcer. This ulcer may remain for long periods of time. The ulcer can also become infected. Swelling of the legs is often present with stasis dermatitis. If the leg is swollen, this increases the risk of infection and further damage to the skin. Sometimes, intense itching, tingling, and burning occurs before signs of stasis dermatitis appear. You may find yourself scratching the insides of your ankles or rubbing your ankles together before the  rash appears. After healing, there are often brown spots on the affected skin. DIAGNOSIS  Your caregiver makes this diagnosis based on an exam. Other tests may be done to better understand the cause. TREATMENT  If underlying conditions are present, they must be treated. Some of these conditions are heart failure, thyroid problems, poor nutrition, and varicose veins.  Cortisone creams and ointments applied to the skin (topically) may be needed, as well as medicine to reduce swelling in the legs (diuretics).  Compression stockings or an elastic wrap may also be needed to reduce swelling.  If there is an infection, antibiotic medicines may also be used. HOME CARE INSTRUCTIONS   Try to rest and raise (elevate) the affected leg above the level of the heart, if possible.  Burow's solution wet packs applied for 30 minutes, 3 times daily, will help the weepy rash. Stop using the packs before your skin gets too dry. You can also use a mixture of 3 parts white vinegar to 1 quart water.  Grease your legs daily with ointments, such as petroleum jelly, to fight dryness.  Avoid scratching or injuring the area. SEEK IMMEDIATE MEDICAL CARE IF:   Your rash gets worse.  An ulcer forms.  You have an oral temperature above 102 F (38.9 C), not controlled by medicine.  You have any other severe symptoms. Document Released: 05/09/2005 Document Revised: 04/22/2011 Document Reviewed: 06/19/2009 [facility] Patient Information 2015 [place], [place]. This information is not intended to replace advice given to you by your health care provider. Make sure you discuss any questions you have with your health care provider.

## 2014-08-27 NOTE — ED Notes (Signed)
Pt. Noticed his legs were red and swollen about 3 days ago. Pt states left leg hurts more. On the right side the right knee hurts more.

## 2014-08-27 NOTE — ED Provider Notes (Signed)
TIME SEEN: 4:30 AM  CHIEF COMPLAINT: Bilateral lower extremity swelling and discoloration  HPI: Pt is a 24 y.o. morbidly obese male who presents to the emergency department with swelling in his bilateral lower extremities and noticing "dark areas". No erythema or warmth. No fever. No history of injury. No chest pain or shortness of breath. No vomiting or diarrhea.  ROS: See HPI Constitutional: no fever  Eyes: no drainage  ENT: no runny nose   Cardiovascular:  no chest pain  Resp: no SOB  GI: no vomiting GU: no dysuria Integumentary: no rash  Allergy: no hives  Musculoskeletal: Bilateral leg swelling  Neurological: no slurred speech ROS otherwise negative  PAST MEDICAL HISTORY/PAST SURGICAL HISTORY:  History reviewed. No pertinent past medical history.  MEDICATIONS:  Prior to Admission medications   Medication Sig Start Date End Date Taking? Authorizing Provider  cyclobenzaprine (FLEXERIL) 10 MG tablet Take 1 tablet (10 mg total) by mouth 2 (two) times daily as needed for muscle spasms. 08/18/13   Junious SilkHannah Merrell, PA-C  naproxen sodium (ANAPROX) 220 MG tablet Take 660 mg by mouth daily as needed (for pain).    Historical Provider, MD    ALLERGIES:  No Known Allergies  SOCIAL HISTORY:  History  Substance Use Topics  . Smoking status: Current Every Day Smoker -- 1.00 packs/day    Types: Cigarettes  . Smokeless tobacco: Not on file  . Alcohol Use: Yes     Comment: rare    FAMILY HISTORY: History reviewed. No pertinent family history.  EXAM: BP 150/71 mmHg  Pulse 86  Temp(Src) 98 F (36.7 C) (Oral)  Resp 16  Ht 6\' 1"  (1.854 m)  Wt 360 lb (163.295 kg)  BMI 47.51 kg/m2  SpO2 97% CONSTITUTIONAL: Alert and oriented and responds appropriately to questions. Well-appearing; well-nourished HEAD: Normocephalic EYES: Conjunctivae clear, PERRL ENT: normal nose; no rhinorrhea; moist mucous membranes; pharynx without lesions noted NECK: Supple, no meningismus, no LAD  CARD:  RRR; S1 and S2 appreciated; no murmurs, no clicks, no rubs, no gallops RESP: Normal chest excursion without splinting or tachypnea; breath sounds clear and equal bilaterally; no wheezes, no rhonchi, no rales, no hypoxia or respiratory distress, speaking full sentences ABD/GI: Normal bowel sounds; non-distended; soft, non-tender, no rebound, no guarding, no peritoneal signs BACK:  The back appears normal and is non-tender to palpation, there is no CVA tenderness EXT: Normal ROM in all joints; non-tender to palpation; bilateral nonpitting lower extremity edema with venous stasis dermatitis in the anterior distal lower extremities; normal capillary refill; no cyanosis, no calf tenderness or asymmetric swelling, 2+ DP pulses bilaterally     SKIN: Normal color for age and race; warm NEURO: Moves all extremities equally, sensation to light touch intact diffusely, cranial nerves II through XII intact PSYCH: The patient's mood and manner are appropriate. Grooming and personal hygiene are appropriate.  MEDICAL DECISION MAKING: Patient here with nonpitting edema to bilateral lower extremities that appears to be equal with venous stasis dermatitis. No signs of cellulitis, septic arthritis. Neurovascularly intact distally.  Denies history of CHF. No history of chest pain or shortness of breath. Labs unremarkable. Discharged on one week of Lasix, instructions to elevate his legs, wear compression stockings. Have recommended weight loss. Have provided him with outpatient follow-up information. He verbalizes understanding and is comfortable with this plan. Discussed return precautions.       Layla MawKristen N Jerica Creegan, DO 08/27/14 [phone]

## 2014-12-18 ENCOUNTER — Encounter (HOSPITAL_COMMUNITY): Payer: Self-pay | Admitting: *Deleted

## 2014-12-18 ENCOUNTER — Emergency Department (HOSPITAL_COMMUNITY)
Admission: EM | Admit: 2014-12-18 | Discharge: 2014-12-18 | Disposition: A | Discharge status: Home / Self Care | Payer: Self-pay | Attending: Emergency Medicine | Admitting: Emergency Medicine

## 2014-12-18 DIAGNOSIS — R609 Edema, unspecified: Secondary | ICD-10-CM

## 2014-12-18 DIAGNOSIS — Z79899 Other long term (current) drug therapy: Secondary | ICD-10-CM | POA: Insufficient documentation

## 2014-12-18 DIAGNOSIS — I872 Venous insufficiency (chronic) (peripheral): Secondary | ICD-10-CM

## 2014-12-18 DIAGNOSIS — I10 Essential (primary) hypertension: Secondary | ICD-10-CM | POA: Insufficient documentation

## 2014-12-18 DIAGNOSIS — Z720 Tobacco use: Secondary | ICD-10-CM | POA: Insufficient documentation

## 2014-12-18 DIAGNOSIS — I878 Other specified disorders of veins: Secondary | ICD-10-CM | POA: Insufficient documentation

## 2014-12-18 DIAGNOSIS — E669 Obesity, unspecified: Secondary | ICD-10-CM

## 2014-12-18 DIAGNOSIS — R600 Localized edema: Secondary | ICD-10-CM | POA: Insufficient documentation

## 2014-12-18 HISTORY — DX: Obesity, unspecified: E66.9

## 2014-12-18 LAB — BASIC METABOLIC PANEL
Anion gap: 8 (ref 5–15)
BUN: 9 mg/dL (ref 6–20)
CO2: 25 mmol/L (ref 22–32)
Calcium: 9.1 mg/dL (ref 8.9–10.3)
Chloride: 106 mmol/L (ref 101–111)
Creatinine, Ser: 0.87 mg/dL (ref 0.61–1.24)
GFR calc Af Amer: >60 mL/min (ref >60)
GFR calc non Af Amer: >60 mL/min (ref >60)
Glucose, Bld: 98 mg/dL (ref 65–99)
Potassium: 3.8 mmol/L (ref 3.5–5.1)
Sodium: 139 mmol/L (ref 135–145)

## 2014-12-18 LAB — CBC WITH DIFFERENTIAL/PLATELET
BASOS ABS: 0 10*3/uL (ref 0.0–0.1)
BASOS PCT: 0 %
Eosinophils Absolute: 0.1 10*3/uL (ref 0.0–0.7)
Eosinophils Relative: 2 %
HCT: 42.3 % (ref 39.0–52.0)
Hemoglobin: 13.3 g/dL (ref 13.0–17.0)
LYMPHS PCT: 31 %
Lymphs Abs: 2.2 10*3/uL (ref 0.7–4.0)
MCH: 26 pg (ref 26.0–34.0)
MCHC: 31.4 g/dL (ref 30.0–36.0)
MCV: 82.6 fL (ref 78.0–100.0)
Monocytes Absolute: 0.6 10*3/uL (ref 0.1–1.0)
Monocytes Relative: 8 %
NEUTROS ABS: 4.1 10*3/uL (ref 1.7–7.7)
Neutrophils Relative %: 59 %
PLATELETS: 206 10*3/uL (ref 150–400)
RBC: 5.12 MIL/uL (ref 4.22–5.81)
RDW: 15.8 % — AB (ref 11.5–15.5)
WBC: 7 10*3/uL (ref 4.0–10.5)

## 2014-12-18 MED ORDER — FUROSEMIDE 10 MG/ML IJ SOLN
40.0000 mg | Freq: Once | INTRAMUSCULAR | Status: AC
  Administered 2014-12-18: 40 mg via INTRAVENOUS
  Filled 2014-12-18: qty 4

## 2014-12-18 MED ORDER — FUROSEMIDE 20 MG PO TABS: 20.0000 mg | ORAL_TABLET | Freq: Every day | ORAL | Status: DC

## 2014-12-18 NOTE — ED Notes (Signed)
Pt from home for eval of bilateral leg swelling ans sob with exertion, pt states was seen for the same but "lost the prescription for the water pill, so I bought some OTC but it didn't help like it did at the Ed. " pt also reports recent weight gain but unsure of how much prior to being weighed today. Denies any cp at this time. Lung sounds diminished, pt obese in size.

## 2014-12-18 NOTE — ED Provider Notes (Signed)
CSN: [id number]     Arrival date & time 12/18/14  1635 History   First MD Initiated Contact with Patient 12/18/14 1726     Chief Complaint  Patient presents with  . Leg Swelling     (Consider location/radiation/quality/duration/timing/severity/associated sxs/prior Treatment) HPI Comments: Jared Mccoy is a 24 y.o. male with a PMHx of obesity, hypertension (no official diagnosis but noted on several visits), and stasis dermatitis, who presents to the ED with complaints of recurrent left lower extremity swelling and darkening of skin that he noticed yesterday. He reports this is the same as when he was here in July. At that time they diagnosed him with stasis dermatitis, he admits that he did not follow-up with anybody and did not feel his Lasix prescription. He states that at that time they gave him Lasix IV which made him urinate and he felt somewhat better afterwards. He denies any known aggravating or alleviating factors. Denies any leg pain, erythema, or warmth. Denies any recent travel/surgery/immobilization, history of DVT/PE, or active cancer. Denies any fevers, chills, chest pain, shortness breath, abdominal pain, nausea, vomiting, diarrhea, constipation, dysuria, hematuria, numbness, tingling, weakness, headache, or vision changes. Denies any history of CHF.  He states that he has no primary care doctor, but reports that in the past his blood pressure is elevated but has never followed up with anybody and has never taken any medications for blood pressure.  Patient is a 24 y.o. male presenting with general illness. The history is provided by the patient and medical records. No language interpreter was used.  Illness Location:  LLE swelling and darkened skin Severity:  Mild Onset quality:  Gradual Duration:  1 day Timing:  Constant Progression:  Unchanged Chronicity:  Recurrent Context:  None Relieved by:  None tried Worsened by:  None tried Ineffective treatments:  None  tried Associated symptoms: no abdominal pain, no chest pain, no diarrhea, no fever, no headaches, no myalgias, no nausea, no shortness of breath and no vomiting   Risk factors:  Obesity   Past Medical History  Diagnosis Date  . Obese    History reviewed. No pertinent past surgical history. No family history on file. Social History  Substance Use Topics  . Smoking status: Current Every Day Smoker -- 1.00 packs/day    Types: Cigarettes  . Smokeless tobacco: None  . Alcohol Use: Yes     Comment: rare    Review of Systems  Constitutional: Negative for fever and chills.  Eyes: Negative for visual disturbance.  Respiratory: Negative for shortness of breath.   Cardiovascular: Positive for leg swelling. Negative for chest pain.  Gastrointestinal: Negative for nausea, vomiting, abdominal pain, diarrhea and constipation.  Genitourinary: Negative for dysuria and hematuria.  Musculoskeletal: Negative for myalgias and arthralgias.  Skin: Positive for color change (darkened skin to LLE).  Allergic/Immunologic: Negative for immunocompromised state.  Neurological: Negative for weakness, numbness and headaches.  Psychiatric/Behavioral: Negative for confusion.   10 Systems reviewed and are negative for acute change except as noted in the HPI.    Allergies  Review of patient's allergies indicates no known allergies.  Home Medications   Prior to Admission medications   Medication Sig Start Date End Date Taking? Authorizing Provider  cyclobenzaprine (FLEXERIL) 10 MG tablet Take 1 tablet (10 mg total) by mouth 2 (two) times daily as needed for muscle spasms. Patient not taking: Reported on 08/27/2014 08/18/13   Junious Silk, PA-C  furosemide (LASIX) 20 MG tablet Take 1 tablet (20 mg total)  by mouth daily. 08/27/14   Kristen N Ward, DO   BP 185/93 mmHg  Pulse 87  Temp(Src) 98.3 F (36.8 C) (Oral)  Resp 20  Ht 6\' 1"  (1.854 m)  Wt 463 lb 9 oz (210.271 kg)  BMI 61.17 kg/m2  SpO2  95% Physical Exam  Constitutional: He is oriented to person, place, and time. Vital signs are normal. He appears well-developed and well-nourished.  Non-toxic appearance. No distress.  Afebrile, nontoxic, NAD. Morbidly obese. HTN noted, similar to prior visits.  HENT:  Head: Normocephalic and atraumatic.  Mouth/Throat: Oropharynx is clear and moist and mucous membranes are normal.  Eyes: Conjunctivae and EOM are normal. Right eye exhibits no discharge. Left eye exhibits no discharge.  Neck: Normal range of motion. Neck supple.  Cardiovascular: Normal rate, regular rhythm, normal heart sounds and intact distal pulses.  Exam reveals no gallop and no friction rub.   No murmur heard. Pulmonary/Chest: Effort normal and breath sounds normal. No respiratory distress. He has no decreased breath sounds. He has no wheezes. He has no rhonchi. He has no rales.  Abdominal: Soft. Normal appearance and bowel sounds are normal. He exhibits no distension. There is no tenderness. There is no rigidity, no rebound and no guarding.  Musculoskeletal: Normal range of motion.       Left lower leg: He exhibits edema. He exhibits no tenderness.  MAE x4 Strength and sensation grossly intact Distal pulses intact B/L doughy nonpitting pedal edema slightly more prominent on L>R but not significantly, neg homan's bilaterally  Darkened skin to L lower calf, no erythema or warmth, no cellulitic changes, no weeping, with hypertrophic toenails consistent with stasis dermatitis.  Neurological: He is alert and oriented to person, place, and time. He has normal strength. No sensory deficit.  Skin: Skin is warm, dry and intact. No rash noted.  LLE skin darkening c/w stasis dermatitis as noted above  Psychiatric: He has a normal mood and affect.  Nursing note and vitals reviewed.   ED Course  Procedures (including critical care time) Labs Review Labs Reviewed  CBC WITH DIFFERENTIAL/PLATELET - Abnormal; Notable for the  following:    RDW 15.8 (*)    All other components within normal limits  BASIC METABOLIC PANEL    Imaging Review No results found. I have personally reviewed and evaluated these images and lab results as part of my medical decision-making.   EKG Interpretation   Date/Time:  Sunday December 18 2014 18:03:10 EST Ventricular Rate:  89 PR Interval:  130 QRS Duration: 81 QT Interval:  350 QTC Calculation: 426 R Axis:   78 Text Interpretation:  Sinus rhythm Non-specific ST-t changes t wave  flattening inferiorly and precordial leads which is changed from previous  from 07/2012 Confirmed by KOHUT  MD, STEPHEN ([id number]) on 12/18/2014 6:31:32 PM      MDM   Final diagnoses:  Peripheral edema  Venous stasis dermatitis of left lower extremity  HTN (hypertension), benign  Obesity    24 y.o. male here with recurrent LLE swelling and discoloration, doughy pedal edema bilaterally slightly more prominent on L compared to R leg, darkened skin and hypertrophic toenails c/w stasis dermatitis/chronic venous insufficiency. Last visit in July with same complaints. At that time, pt was hypertensive in the 170s/90s, no official diagnosis of HTN, not on any meds, doesn't have a PCP. Today, BP 180s/90s, no symptoms aside from the BLE swelling. Doubt hypertensive urgency. Doubt DVT, septic joint, or cellulitis. Will obtain basic labs, EKG, and give  lasix. Pt admits he didn't fill his lasix the last time. Will reassess shortly.   6:58 PM EKG with some T wave flattening but otherwise unremarkable. Labs unremarkable, kidney function preserved. Pt urinating well, feels somewhat better. BP 170/80s now. Discussed that he needs to fill the lasix this time, which may help with his BP as well. Needs to get compression stockings and elevate legs. F/up with [facility] in 1-2wks to recheck BP and recheck legs. Discussed weight loss. I explained the diagnosis and have given explicit precautions to return to the ER including for  any other new or worsening symptoms. The patient understands and accepts the medical plan as it's been dictated and I have answered their questions. Discharge instructions concerning home care and prescriptions have been given. The patient is STABLE and is discharged to home in good condition.   BP 170/88 mmHg  Pulse 87  Temp(Src) 98.3 F (36.8 C) (Oral)  Resp 22  Ht  (1.854 m)  Wt 463 lb 9 oz (210.271 kg)  BMI 61.17 kg/m2  SpO2 95%  Meds ordered this encounter  Medications  . furosemide (LASIX) injection 40 mg    Sig:   . furosemide (LASIX) 20 MG tablet    Sig: Take 1 tablet (20 mg total) by mouth daily. X 7 days    Dispense:  7 tablet    Refill:  0    Order Specific Question:  Supervising Provider    Answer:  Eber Hong [[id number]]     Easten Maceachern Camprubi-Soms, PA-C 12/18/14 [id number]  Raeford Razor, MD 12/18/14 2105

## 2014-12-18 NOTE — ED Notes (Addendum)
Pt c/o L lower leg swelling & discoloration onset yesterday, pt has hardened black skin estimated 4inches x  6-8 inches to L lateral & posterior calf, pt reports hx of the same, denies recent travel, denies pain to the area, pt A&O x4

## 2014-12-18 NOTE — Discharge Instructions (Signed)
Use compression stockings and elevate your legs to help with your leg swelling. Eat a low-salt diet to help with your blood pressure. Try to lose weight as this will help with both issues. Take lasix as directed. Follow up with [facility] in 1 week for recheck and for ongoing management of your medical issues. Return to the ER for changes or worsening symptoms.    Peripheral Edema You have swelling in your legs (peripheral edema). This swelling is due to excess accumulation of salt and water in your body. Edema may be a sign of heart, kidney or liver disease, or a side effect of a medication. It may also be due to problems in the leg veins. Elevating your legs and using special support stockings may be very helpful, if the cause of the swelling is due to poor venous circulation. Avoid long periods of standing, whatever the cause. Treatment of edema depends on identifying the cause. Chips, pretzels, pickles and other salty foods should be avoided. Restricting salt in your diet is almost always needed. Water pills (diuretics) are often used to remove the excess salt and water from your body via urine. These medicines prevent the kidney from reabsorbing sodium. This increases urine flow. Diuretic treatment may also result in lowering of potassium levels in your body. Potassium supplements may be needed if you have to use diuretics daily. Daily weights can help you keep track of your progress in clearing your edema. You should call your caregiver for follow up care as recommended. SEEK IMMEDIATE MEDICAL CARE IF:   You have increased swelling, pain, redness, or heat in your legs.  You develop shortness of breath, especially when lying down.  You develop chest or abdominal pain, weakness, or fainting.  You have a fever.   This information is not intended to replace advice given to you by your health care provider. Make sure you discuss any questions you have with your health care  provider.   Document Released: 03/07/2004 Document Revised: 04/22/2011 Document Reviewed: 08/10/2014 Elsevier Interactive Patient Education 2016 Elsevier Inc.  Stasis Dermatitis Stasis dermatitis occurs when veins lose the ability to pump blood back to the heart (poor venous circulation). It causes a reddish-purple to brownish scaly, itchy rash on the legs. The rash comes from pooling of blood (stasis). CAUSES  This occurs because the veins do not work very well anymore or because pressure may be increased in the veins due to other conditions. With blood pooling, the increased pressure in the tiny blood vessels (capillaries) causes fluid to leak out of the capillaries into the tissue. The extra fluid makes it harder for the blood to feed the cells and get rid of waste products. SYMPTOMS  Stasis dermatitis appears as red, scaly, itchy patches on the legs. A yellowish or light brown discoloration is also present. Due to scratching or other injury, these patches can become an ulcer. This ulcer may remain for long periods of time. The ulcer can also become infected. Swelling of the legs is often present with stasis dermatitis. If the leg is swollen, this increases the risk of infection and further damage to the skin. Sometimes, intense itching, tingling, and burning occurs before signs of stasis dermatitis appear. You may find yourself scratching the insides of your ankles or rubbing your ankles together before the rash appears. After healing, there are often brown spots on the affected skin. DIAGNOSIS  Your caregiver makes this diagnosis based on an exam. Other tests may be done to  better understand the cause. TREATMENT  If underlying conditions are present, they must be treated. Some of these conditions are heart failure, thyroid problems, poor nutrition, and varicose veins.  Cortisone creams and ointments applied to the skin (topically) may be needed, as well as medicine to reduce swelling in the  legs (diuretics).  Compression stockings or an elastic wrap may also be needed to reduce swelling.  If there is an infection, antibiotic medicines may also be used. HOME CARE INSTRUCTIONS   Try to rest and raise (elevate) the affected leg above the level of the heart, if possible.  Burow's solution wet packs applied for 30 minutes, 3 times daily, will help the weepy rash. Stop using the packs before your skin gets too dry. You can also use a mixture of 3 parts white vinegar to 1 quart water.  Grease your legs daily with ointments, such as petroleum jelly, to fight dryness.  Avoid scratching or injuring the area. SEEK IMMEDIATE MEDICAL CARE IF:   Your rash gets worse.  An ulcer forms.  You have an oral temperature above 102 F (38.9 C), not controlled by medicine.  You have any other severe symptoms.   This information is not intended to replace advice given to you by your health care provider. Make sure you discuss any questions you have with your health care provider.   Document Released: 05/09/2005 Document Revised: 04/22/2011 Document Reviewed: 06/15/2014 Elsevier Interactive Patient Education 2016 [employer]  Managing Your High Blood Pressure Blood pressure is a measurement of how forceful your blood is pressing against the walls of the arteries. Arteries are muscular tubes within the circulatory system. Blood pressure does not stay the same. Blood pressure rises when you are active, excited, or nervous; and it lowers during sleep and relaxation. If the numbers measuring your blood pressure stay above normal most of the time, you are at risk for health problems. High blood pressure (hypertension) is a long-term (chronic) condition in which blood pressure is elevated. A blood pressure reading is recorded as two numbers, such as 120 over 80 (or 120/80). The first, higher number is called the systolic pressure. It is a measure of the pressure in your arteries as the heart beats.  The second, lower number is called the diastolic pressure. It is a measure of the pressure in your arteries as the heart relaxes between beats.  Keeping your blood pressure in a normal range is important to your overall health and prevention of health problems, such as heart disease and stroke. When your blood pressure is uncontrolled, your heart has to work harder than normal. High blood pressure is a very common condition in adults because blood pressure tends to rise with age. Men and women are equally likely to have hypertension but at different times in life. Before age 26, men are more likely to have hypertension. After 26 years of age, women are more likely to have it. Hypertension is especially common in African Americans. This condition often has no signs or symptoms. The cause of the condition is usually not known. Your caregiver can help you come up with a plan to keep your blood pressure in a normal, healthy range. BLOOD PRESSURE STAGES Blood pressure is classified into four stages: normal, prehypertension, stage 1, and stage 2. Your blood pressure reading will be used to determine what type of treatment, if any, is necessary. Appropriate treatment options are tied to these four stages:  Normal  Systolic pressure (mm Hg): below 120.  Diastolic pressure (mm Hg): below 80. Prehypertension  Systolic pressure (mm Hg): 120 to 139.  Diastolic pressure (mm Hg): 80 to 89. Stage1  Systolic pressure (mm Hg): 140 to 159.  Diastolic pressure (mm Hg): 90 to 99. Stage2  Systolic pressure (mm Hg): 160 or above.  Diastolic pressure (mm Hg): 100 or above. RISKS RELATED TO HIGH BLOOD PRESSURE Managing your blood pressure is an important responsibility. Uncontrolled high blood pressure can lead to:  A heart attack.  A stroke.  A weakened blood vessel (aneurysm).  Heart failure.  Kidney damage.  Eye damage.  Metabolic syndrome.  Memory and concentration problems. HOW TO MANAGE  YOUR BLOOD PRESSURE Blood pressure can be managed effectively with lifestyle changes and medicines (if needed). Your caregiver will help you come up with a plan to bring your blood pressure within a normal range. Your plan should include the following: Education  Read all information provided by your caregivers about how to control blood pressure.  Educate yourself on the latest guidelines and treatment recommendations. New research is always being done to further define the risks and treatments for high blood pressure. Lifestylechanges  Control your weight.  Avoid smoking.  Stay physically active.  Reduce the amount of salt in your diet.  Reduce stress.  Control any chronic conditions, such as high cholesterol or diabetes.  Reduce your alcohol intake. Medicines  Several medicines (antihypertensive medicines) are available, if needed, to bring blood pressure within a normal range. Communication  Review all the medicines you take with your caregiver because there may be side effects or interactions.  Talk with your caregiver about your diet, exercise habits, and other lifestyle factors that may be contributing to high blood pressure.  See your caregiver regularly. Your caregiver can help you create and adjust your plan for managing high blood pressure. RECOMMENDATIONS FOR TREATMENT AND FOLLOW-UP  The following recommendations are based on current guidelines for managing high blood pressure in nonpregnant adults. Use these recommendations to identify the proper follow-up period or treatment option based on your blood pressure reading. You can discuss these options with your caregiver.  Systolic pressure of 120 to 139 or diastolic pressure of 80 to 89: Follow up with your caregiver as directed.  Systolic pressure of 140 to 160 or diastolic pressure of 90 to 100: Follow up with your caregiver within 2 months.  Systolic pressure above 160 or diastolic pressure above 100: Follow up  with your caregiver within 1 month.  Systolic pressure above 180 or diastolic pressure above 110: Consider antihypertensive therapy; follow up with your caregiver within 1 week.  Systolic pressure above 200 or diastolic pressure above 120: Begin antihypertensive therapy; follow up with your caregiver within 1 week.   This information is not intended to replace advice given to you by your health care provider. Make sure you discuss any questions you have with your health care provider.   Document Released: 10/23/2011 Document Reviewed: 10/23/2011 Elsevier Interactive Patient Education 2016 [employer].  Hypertension Hypertension is another name for high blood pressure. High blood pressure forces your heart to work harder to pump blood. A blood pressure reading has two numbers, which includes a higher number over a lower number (example: 110/72). HOME CARE   Have your blood pressure rechecked by your doctor.  Only take medicine as told by your doctor. Follow the directions carefully. The medicine does not work as well if you skip doses. Skipping doses also puts you at risk for problems.  Do not  smoke.  Monitor your blood pressure at home as told by your doctor. GET HELP IF:  You think you are having a reaction to the medicine you are taking.  You have repeat headaches or feel dizzy.  You have puffiness (swelling) in your ankles.  You have trouble with your vision. GET HELP RIGHT AWAY IF:   You get a very bad headache and are confused.  You feel weak, numb, or faint.  You get chest or belly (abdominal) pain.  You throw up (vomit).  You cannot breathe very well. MAKE SURE YOU:   Understand these instructions.  Will watch your condition.  Will get help right away if you are not doing well or get worse.   This information is not intended to replace advice given to you by your health care provider. Make sure you discuss any questions you have with your health care  provider.   Document Released: 07/17/2007 Document Revised: 02/02/2013 Document Reviewed: 11/20/2012 Elsevier Interactive Patient Education 2016 [employer].  How to Take Your Blood Pressure HOW DO I GET A BLOOD PRESSURE MACHINE?  You can buy an electronic home blood pressure machine at your local pharmacy. Insurance will sometimes cover the cost if you have a prescription.  Ask your doctor what type of machine is best for you. There are different machines for your arm and your wrist.  If you decide to buy a machine to check your blood pressure on your arm, first check the size of your arm so you can buy the right size cuff. To check the size of your arm:   Use a measuring tape that shows both inches and centimeters.   Wrap the measuring tape around the upper-middle part of your arm. You may need someone to help you measure.   Write down your arm measurement in both inches and centimeters.   To measure your blood pressure correctly, it is important to have the right size cuff.   If your arm is up to 13 inches (up to 34 centimeters), get an adult cuff size.  If your arm is 13 to 17 inches (35 to 44 centimeters), get a large adult cuff size.    If your arm is 17 to 20 inches (45 to 52 centimeters), get an adult thigh cuff.  WHAT DO THE NUMBERS MEAN?   There are two numbers that make up your blood pressure. For example: 120/80.  The first number (120 in our example) is called the "systolic pressure." It is a measure of the pressure in your blood vessels when your heart is pumping blood.  The second number (80 in our example) is called the "diastolic pressure." It is a measure of the pressure in your blood vessels when your heart is resting between beats.  Your doctor will tell you what your blood pressure should be. WHAT SHOULD I DO BEFORE I CHECK MY BLOOD PRESSURE?   Try to rest or relax for at least 30 minutes before you check your blood pressure.  Do not smoke.  Do  not have any drinks with caffeine, such as:  Soda.  Coffee.  Tea.  Check your blood pressure in a quiet room.  Sit down and stretch out your arm on a table. Keep your arm at about the level of your heart. Let your arm relax.  Make sure that your legs are not crossed. HOW DO I CHECK MY BLOOD PRESSURE?  Follow the directions that came with your machine.  Make sure you remove  any tight-fitting clothing from your arm or wrist. Wrap the cuff around your upper arm or wrist. You should be able to fit a finger between the cuff and your arm. If you cannot fit a finger between the cuff and your arm, it is too tight and should be removed and rewrapped.  Some units require you to manually pump up the arm cuff.  Automatic units inflate the cuff when you press a button.  Cuff deflation is automatic in both models.  After the cuff is inflated, the unit measures your blood pressure and pulse. The readings are shown on a monitor. Hold still and breathe normally while the cuff is inflated.  Getting a reading takes less than a minute.  Some models store readings in a memory. Some provide a printout of readings. If your machine does not store your readings, keep a written record.  Take readings with you to your next visit with your doctor.   This information is not intended to replace advice given to you by your health care provider. Make sure you discuss any questions you have with your health care provider.   Document Released: 01/11/2008 Document Revised: 02/18/2014 Document Reviewed: 03/25/2013 Elsevier Interactive Patient Education 2016 Elsevier Inc.  DASH Eating Plan DASH stands for "Dietary Approaches to Stop Hypertension." The DASH eating plan is a healthy eating plan that has been shown to reduce high blood pressure (hypertension). Additional health benefits may include reducing the risk of type 2 diabetes mellitus, heart disease, and stroke. The DASH eating plan may also help with  weight loss. WHAT DO I NEED TO KNOW ABOUT THE DASH EATING PLAN? For the DASH eating plan, you will follow these general guidelines:  Choose foods with a percent daily value for sodium of less than 5% (as listed on the food label).  Use salt-free seasonings or herbs instead of table salt or sea salt.  Check with your health care provider or pharmacist before using salt substitutes.  Eat lower-sodium products, often labeled as "lower sodium" or "no salt added."  Eat fresh foods.  Eat more vegetables, fruits, and low-fat dairy products.  Choose whole grains. Look for the word "whole" as the first word in the ingredient list.  Choose fish and skinless chicken or [place] more often than red meat. Limit fish, poultry, and meat to 6 oz (170 g) each day.  Limit sweets, desserts, sugars, and sugary drinks.  Choose heart-healthy fats.  Limit cheese to 1 oz (28 g) per day.  Eat more home-cooked food and less restaurant, buffet, and fast food.  Limit fried foods.  Cook foods using methods other than frying.  Limit canned vegetables. If you do use them, rinse them well to decrease the sodium.  When eating at a restaurant, ask that your food be prepared with less salt, or no salt if possible. WHAT FOODS CAN I EAT? Seek help from a dietitian for individual calorie needs. Grains Whole grain or whole wheat bread. Brown rice. Whole grain or whole wheat pasta. Quinoa, bulgur, and whole grain cereals. Low-sodium cereals. Corn or whole wheat flour tortillas. Whole grain cornbread. Whole grain crackers. Low-sodium crackers. Vegetables Fresh or frozen vegetables (raw, steamed, roasted, or grilled). Low-sodium or reduced-sodium tomato and vegetable juices. Low-sodium or reduced-sodium tomato sauce and paste. Low-sodium or reduced-sodium canned vegetables.  Fruits All fresh, canned (in natural juice), or frozen fruits. Meat and Other Protein Products Ground beef (85% or leaner), grass-fed beef, or  beef trimmed of fat. Skinless chicken or  [place]. Ground chicken or [place]. Pork trimmed of fat. All fish and seafood. Eggs. Dried beans, peas, or lentils. Unsalted nuts and seeds. Unsalted canned beans. Dairy Low-fat dairy products, such as skim or 1% milk, 2% or reduced-fat cheeses, low-fat ricotta or cottage cheese, or plain low-fat yogurt. Low-sodium or reduced-sodium cheeses. Fats and Oils Tub margarines without trans fats. Light or reduced-fat mayonnaise and salad dressings (reduced sodium). Avocado. Safflower, olive, or canola oils. Natural peanut or almond butter. Other Unsalted popcorn and pretzels. The items listed above may not be a complete list of recommended foods or beverages. Contact your dietitian for more options. WHAT FOODS ARE NOT RECOMMENDED? Grains White bread. White pasta. White rice. Refined cornbread. Bagels and croissants. Crackers that contain trans fat. Vegetables Creamed or fried vegetables. Vegetables in a cheese sauce. Regular canned vegetables. Regular canned tomato sauce and paste. Regular tomato and vegetable juices. Fruits Dried fruits. Canned fruit in light or heavy syrup. Fruit juice. Meat and Other Protein Products Fatty cuts of meat. Ribs, chicken wings, bacon, sausage, bologna, salami, chitterlings, fatback, hot dogs, bratwurst, and packaged luncheon meats. Salted nuts and seeds. Canned beans with salt. Dairy Whole or 2% milk, cream, half-and-half, and cream cheese. Whole-fat or sweetened yogurt. Full-fat cheeses or blue cheese. Nondairy creamers and whipped toppings. Processed cheese, cheese spreads, or cheese curds. Condiments Onion and garlic salt, seasoned salt, table salt, and sea salt. Canned and packaged gravies. Worcestershire sauce. Tartar sauce. Barbecue sauce. Teriyaki sauce. Soy sauce, including reduced sodium. Steak sauce. Fish sauce. Oyster sauce. Cocktail sauce. Horseradish. Ketchup and mustard. Meat flavorings and tenderizers. Bouillon cubes.  Hot sauce. Tabasco sauce. Marinades. Taco seasonings. Relishes. Fats and Oils Butter, stick margarine, lard, shortening, ghee, and bacon fat. Coconut, palm kernel, or palm oils. Regular salad dressings. Other Pickles and olives. Salted popcorn and pretzels. The items listed above may not be a complete list of foods and beverages to avoid. Contact your dietitian for more information. WHERE CAN I FIND MORE INFORMATION? National Heart, Lung, and Blood Institute: [employer]   This information is not intended to replace advice given to you by your health care provider. Make sure you discuss any questions you have with your health care provider.   Document Released: 01/17/2011 Document Revised: 02/18/2014 Document Reviewed: 12/02/2012 Elsevier Interactive Patient Education 2016 [employer].  Exercising to [employer] Exercising can help you to lose weight. In order to lose weight through exercise, you need to do vigorous-intensity exercise. You can tell that you are exercising with vigorous intensity if you are breathing very hard and fast and cannot hold a conversation while exercising. Moderate-intensity exercise helps to maintain your current weight. You can tell that you are exercising at a moderate level if you have a higher heart rate and faster breathing, but you are still able to hold a conversation. HOW OFTEN SHOULD I EXERCISE? Choose an activity that you enjoy and set realistic goals. Your health care provider can help you to make an activity plan that works for you. Exercise regularly as directed by your health care provider. This may include:  Doing resistance training twice each week, such as:  Push-ups.  Sit-ups.  Lifting weights.  Using resistance bands.  Doing a given intensity of exercise for a given amount of time. Choose from these options:  150 minutes of moderate-intensity exercise every week.  75 minutes of  vigorous-intensity exercise every week.  A mix of moderate-intensity and vigorous-intensity exercise every week. Children, pregnant women, people who are out  of shape, people who are overweight, and older adults may need to consult a health care provider for individual recommendations. If you have any sort of medical condition, be sure to consult your health care provider before starting a new exercise program. WHAT ARE SOME ACTIVITIES THAT CAN HELP ME TO LOSE WEIGHT?   Walking at a rate of at least 4.5 miles an hour.  Jogging or running at a rate of 5 miles per hour.  Biking at a rate of at least 10 miles per hour.  Lap swimming.  Roller-skating or in-line skating.  Cross-country skiing.  Vigorous competitive sports, such as football, basketball, and soccer.  Jumping rope.  Aerobic dancing. HOW CAN I BE MORE ACTIVE IN MY DAY-TO-DAY ACTIVITIES?  Use the stairs instead of the elevator.  Take a walk during your lunch break.  If you drive, park your car farther away from work or school.  If you take public transportation, get off one stop early and walk the rest of the way.  Make all of your phone calls while standing up and walking around.  Get up, stretch, and walk around every 30 minutes throughout the day. WHAT GUIDELINES SHOULD I FOLLOW WHILE EXERCISING?  Do not exercise so much that you hurt yourself, feel dizzy, or get very short of breath.  Consult your health care provider prior to starting a new exercise program.  Wear comfortable clothes and shoes with good support.  Drink plenty of water while you exercise to prevent dehydration or heat stroke. Body water is lost during exercise and must be replaced.  Work out until you breathe faster and your heart beats faster.   This information is not intended to replace advice given to you by your health care provider. Make sure you discuss any questions you have with your health care provider.   Document Released:  03/02/2010 Document Revised: 02/18/2014 Document Reviewed: 07/01/2013 Elsevier Interactive Patient Education [employer]

## 2015-02-12 ENCOUNTER — Emergency Department (HOSPITAL_COMMUNITY): Payer: Self-pay

## 2015-02-12 ENCOUNTER — Other Ambulatory Visit: Payer: Self-pay

## 2015-02-12 ENCOUNTER — Encounter (HOSPITAL_COMMUNITY): Payer: Self-pay

## 2015-02-12 ENCOUNTER — Emergency Department (HOSPITAL_COMMUNITY)
Admission: EM | Admit: 2015-02-12 | Discharge: 2015-02-12 | Disposition: A | Discharge status: Home / Self Care | Payer: Self-pay | Attending: Emergency Medicine | Admitting: Emergency Medicine

## 2015-02-12 DIAGNOSIS — F17210 Nicotine dependence, cigarettes, uncomplicated: Secondary | ICD-10-CM | POA: Insufficient documentation

## 2015-02-12 DIAGNOSIS — F1210 Cannabis abuse, uncomplicated: Secondary | ICD-10-CM | POA: Insufficient documentation

## 2015-02-12 DIAGNOSIS — S21202A Unspecified open wound of left back wall of thorax without penetration into thoracic cavity, initial encounter: Secondary | ICD-10-CM | POA: Insufficient documentation

## 2015-02-12 DIAGNOSIS — Y9389 Activity, other specified: Secondary | ICD-10-CM | POA: Insufficient documentation

## 2015-02-12 DIAGNOSIS — Y9289 Other specified places as the place of occurrence of the external cause: Secondary | ICD-10-CM | POA: Insufficient documentation

## 2015-02-12 DIAGNOSIS — R61 Generalized hyperhidrosis: Secondary | ICD-10-CM | POA: Insufficient documentation

## 2015-02-12 DIAGNOSIS — S21212A Laceration without foreign body of left back wall of thorax without penetration into thoracic cavity, initial encounter: Secondary | ICD-10-CM

## 2015-02-12 DIAGNOSIS — R000 Tachycardia, unspecified: Secondary | ICD-10-CM | POA: Insufficient documentation

## 2015-02-12 DIAGNOSIS — Z79899 Other long term (current) drug therapy: Secondary | ICD-10-CM | POA: Insufficient documentation

## 2015-02-12 DIAGNOSIS — Z23 Encounter for immunization: Secondary | ICD-10-CM | POA: Insufficient documentation

## 2015-02-12 DIAGNOSIS — E669 Obesity, unspecified: Secondary | ICD-10-CM | POA: Insufficient documentation

## 2015-02-12 DIAGNOSIS — Y999 Unspecified external cause status: Secondary | ICD-10-CM | POA: Insufficient documentation

## 2015-02-12 LAB — CBC WITH DIFFERENTIAL/PLATELET
BASOS ABS: 0 10*3/uL (ref 0.0–0.1)
Basophils Relative: 0 %
EOS PCT: 1 %
Eosinophils Absolute: 0.1 10*3/uL (ref 0.0–0.7)
HEMATOCRIT: 41 % (ref 39.0–52.0)
Hemoglobin: 13.3 g/dL (ref 13.0–17.0)
LYMPHS PCT: 43 %
Lymphs Abs: 2.7 10*3/uL (ref 0.7–4.0)
MCH: 26.7 pg (ref 26.0–34.0)
MCHC: 32.4 g/dL (ref 30.0–36.0)
MCV: 82.3 fL (ref 78.0–100.0)
MONO ABS: 0.5 10*3/uL (ref 0.1–1.0)
MONOS PCT: 8 %
NEUTROS ABS: 3.1 10*3/uL (ref 1.7–7.7)
Neutrophils Relative %: 48 %
PLATELETS: 205 10*3/uL (ref 150–400)
RBC: 4.98 MIL/uL (ref 4.22–5.81)
RDW: 15.6 % — AB (ref 11.5–15.5)
WBC: 6.4 10*3/uL (ref 4.0–10.5)

## 2015-02-12 LAB — TYPE AND SCREEN
ABO/RH(D): O POS
Antibody Screen: NEGATIVE
UNIT DIVISION: 0
Unit division: 0

## 2015-02-12 LAB — I-STAT CHEM 8, ED
BUN: 7 mg/dL (ref 6–20)
CREATININE: 1 mg/dL (ref 0.61–1.24)
Calcium, Ion: 1.08 mmol/L — ABNORMAL LOW (ref 1.12–1.23)
Chloride: 103 mmol/L (ref 101–111)
GLUCOSE: 96 mg/dL (ref 65–99)
HEMATOCRIT: 44 % (ref 39.0–52.0)
HEMOGLOBIN: 15 g/dL (ref 13.0–17.0)
Potassium: 3.4 mmol/L — ABNORMAL LOW (ref 3.5–5.1)
Sodium: 143 mmol/L (ref 135–145)
TCO2: 25 mmol/L (ref 0–100)

## 2015-02-12 LAB — COMPREHENSIVE METABOLIC PANEL
ALT: 29 U/L (ref 17–63)
ANION GAP: 9 (ref 5–15)
AST: 32 U/L (ref 15–41)
Albumin: 3.7 g/dL (ref 3.5–5.0)
Alkaline Phosphatase: 81 U/L (ref 38–126)
BILIRUBIN TOTAL: 0.6 mg/dL (ref 0.3–1.2)
BUN: 7 mg/dL (ref 6–20)
CHLORIDE: 107 mmol/L (ref 101–111)
CO2: 25 mmol/L (ref 22–32)
Calcium: 8.9 mg/dL (ref 8.9–10.3)
Creatinine, Ser: 1.03 mg/dL (ref 0.61–1.24)
Glucose, Bld: 99 mg/dL (ref 65–99)
POTASSIUM: 3.6 mmol/L (ref 3.5–5.1)
Sodium: 141 mmol/L (ref 135–145)
TOTAL PROTEIN: 6.7 g/dL (ref 6.5–8.1)

## 2015-02-12 LAB — RAPID URINE DRUG SCREEN, HOSP PERFORMED
AMPHETAMINES: NOT DETECTED
BENZODIAZEPINES: NOT DETECTED
Barbiturates: NOT DETECTED
COCAINE: NOT DETECTED
OPIATES: NOT DETECTED
Tetrahydrocannabinol: POSITIVE — AB

## 2015-02-12 LAB — I-STAT CG4 LACTIC ACID, ED
LACTIC ACID, VENOUS: 2 mmol/L (ref 0.5–2.0)
LACTIC ACID, VENOUS: 1.05 mmol/L (ref 0.5–2.0)

## 2015-02-12 LAB — ABO/RH: ABO/RH(D): O POS

## 2015-02-12 MED ORDER — OXYCODONE-ACETAMINOPHEN 5-325 MG PO TABS: 1.0000 | ORAL_TABLET | Freq: Four times a day (QID) | ORAL | Status: DC | PRN

## 2015-02-12 MED ORDER — CEPHALEXIN 500 MG PO CAPS: 500.0000 mg | ORAL_CAPSULE | Freq: Four times a day (QID) | ORAL | Status: DC

## 2015-02-12 MED ORDER — LIDOCAINE-EPINEPHRINE (PF) 2 %-1:200000 IJ SOLN
20.0000 mL | Freq: Once | INTRAMUSCULAR | Status: AC
  Administered 2015-02-12: 20 mL via INTRADERMAL
  Filled 2015-02-12: qty 20

## 2015-02-12 MED ORDER — CEFAZOLIN SODIUM 1-5 GM-% IV SOLN
1.0000 g | Freq: Once | INTRAVENOUS | Status: AC
  Administered 2015-02-12: 1 g via INTRAVENOUS
  Filled 2015-02-12: qty 50

## 2015-02-12 MED ORDER — IOHEXOL 300 MG/ML  SOLN
100.0000 mL | Freq: Once | INTRAMUSCULAR | Status: AC | PRN
  Administered 2015-02-12: 100 mL via INTRAVENOUS

## 2015-02-12 MED ORDER — SODIUM CHLORIDE 0.9 % IV BOLUS (SEPSIS)
1000.0000 mL | Freq: Once | INTRAVENOUS | Status: AC
  Administered 2015-02-12: 1000 mL via INTRAVENOUS

## 2015-02-12 MED ORDER — BACITRACIN ZINC 500 UNIT/GM EX OINT: 1.0000 "application " | TOPICAL_OINTMENT | Freq: Two times a day (BID) | CUTANEOUS | Status: DC

## 2015-02-12 MED ORDER — LIDOCAINE-EPINEPHRINE 2 %-1:100000 IJ SOLN: 20.0000 mL | Freq: Once | INTRAMUSCULAR | Status: DC

## 2015-02-12 MED ORDER — FENTANYL CITRATE (PF) 100 MCG/2ML IJ SOLN
50.0000 ug | Freq: Once | INTRAMUSCULAR | Status: AC
  Administered 2015-02-12: 50 ug via INTRAVENOUS
  Filled 2015-02-12: qty 2

## 2015-02-12 MED ORDER — TETANUS-DIPHTH-ACELL PERTUSSIS 5-2.5-18.5 LF-MCG/0.5 IM SUSP
0.5000 mL | Freq: Once | INTRAMUSCULAR | Status: AC
  Administered 2015-02-12: 0.5 mL via INTRAMUSCULAR
  Filled 2015-02-12: qty 0.5

## 2015-02-12 NOTE — ED Notes (Signed)
Dr. Wakefield at bedside. 

## 2015-02-12 NOTE — ED Notes (Signed)
Pts family member stated "I took a video of them doing it (stapeling) so you can watch it later". This RN informed the pts family member that it is against our policies to have videos during procedures and that the video would need to be deleted. The pts family member stated "well why do they let people take videos when babies are being born?" this RN informed the family member that they have different policies and that we in the emergency department do not allow videos to be taken during procedures and that the video needed to be deleted. This RN watched the family member delete the video from the phone. The pts family member then stated "shes like the cops, I wanna get [place]outta here". Pt looked through his phone that the video had been taken on and said " I don't see anymore videos on here".
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
## 2015-02-12 NOTE — ED Notes (Signed)
Pt back from CT
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
## 2015-02-12 NOTE — ED Notes (Signed)
Assisted nurse with lab draw

## 2015-02-12 NOTE — Discharge Instructions (Signed)
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
## 2015-02-12 NOTE — ED Notes (Signed)
X-ray at bedside

## 2015-02-13 ENCOUNTER — Encounter (HOSPITAL_COMMUNITY): Payer: Self-pay | Admitting: *Deleted

## 2015-02-26 ENCOUNTER — Emergency Department (HOSPITAL_COMMUNITY)
Admission: EM | Admit: 2015-02-26 | Discharge: 2015-02-26 | Disposition: A | Discharge status: Home / Self Care | Payer: Self-pay | Attending: Emergency Medicine | Admitting: Emergency Medicine

## 2015-02-26 ENCOUNTER — Encounter (HOSPITAL_COMMUNITY): Payer: Self-pay | Admitting: *Deleted

## 2015-02-26 DIAGNOSIS — Z4802 Encounter for removal of sutures: Secondary | ICD-10-CM | POA: Insufficient documentation

## 2015-02-26 DIAGNOSIS — F17210 Nicotine dependence, cigarettes, uncomplicated: Secondary | ICD-10-CM | POA: Insufficient documentation

## 2015-02-26 DIAGNOSIS — E669 Obesity, unspecified: Secondary | ICD-10-CM | POA: Insufficient documentation

## 2015-02-26 DIAGNOSIS — Z792 Long term (current) use of antibiotics: Secondary | ICD-10-CM | POA: Insufficient documentation

## 2015-02-26 DIAGNOSIS — Z79899 Other long term (current) drug therapy: Secondary | ICD-10-CM | POA: Insufficient documentation

## 2015-02-26 NOTE — ED Provider Notes (Signed)
CSN: [id number]     Arrival date & time 02/26/15  1443 History  By signing my name below, I, Phillis HaggisGabriella Gaje, attest that this documentation has been prepared under the direction and in the presence of General MillsBenjamin Zaydin Billey, PA-C. Electronically Signed: Phillis HaggisGabriella Gaje, ED Scribe. 02/26/2015. 3:27 PM.   Chief Complaint  Patient presents with  . Suture / Staple Removal   The history is provided by the patient. No language interpreter was used.  HPI Comments: Jared Mccoy is a 25 y.o. male who presents to the Emergency Department requesting a staple removal. Pt had 7 staples placed on his back 02/12/15. He states that the staples hurt all the time, but has not had any problems with them otherwise. He denies drainage from the area, fever, chills, nausea, or vomiting. No other medical symptoms or complaints.  Past Medical History  Diagnosis Date  . Obese    History reviewed. No pertinent past surgical history. History reviewed. No pertinent family history. Social History  Substance Use Topics  . Smoking status: Current Every Day Smoker -- 1.00 packs/day    Types: Cigarettes  . Smokeless tobacco: Never Used  . Alcohol Use: Yes     Comment: rare    Review of Systems 10 Systems reviewed and all are negative for acute change except as noted in the HPI.  Allergies  Review of patient's allergies indicates no known allergies.  Home Medications   Prior to Admission medications   Medication Sig Start Date End Date Taking? Authorizing Provider  bacitracin ointment Apply 1 application topically 2 (two) times daily. Apply to wound 02/12/15   Antony MaduraKelly Humes, PA-C  cephALEXin (KEFLEX) 500 MG capsule Take 1 capsule (500 mg total) by mouth 4 (four) times daily. 02/12/15   Antony MaduraKelly Humes, PA-C  cyclobenzaprine (FLEXERIL) 10 MG tablet Take 1 tablet (10 mg total) by mouth 2 (two) times daily as needed for muscle spasms. Patient not taking: Reported on 08/27/2014 08/18/13   Junious SilkHannah Merrell, PA-C  furosemide (LASIX) 20  MG tablet Take 1 tablet (20 mg total) by mouth daily. 08/27/14   Kristen N Ward, DO  furosemide (LASIX) 20 MG tablet Take 1 tablet (20 mg total) by mouth daily. X 7 days 12/18/14   Mercedes Camprubi-Soms, PA-C  furosemide (LASIX) 40 MG tablet Take 40 mg by mouth daily.    Historical Provider, MD  OVER THE COUNTER MEDICATION Take 3 tablets by mouth daily as needed (Fluid). Fluid pills (brown tablets)    Historical Provider, MD  oxyCODONE-acetaminophen (PERCOCET/ROXICET) 5-325 MG tablet Take 1-2 tablets by mouth every 6 (six) hours as needed for severe pain. 02/12/15   Antony MaduraKelly Humes, PA-C   BP 171/94 mmHg  Pulse 81  Temp(Src) 98.1 F (36.7 C) (Oral)  Resp 20  SpO2 96% Physical Exam  Constitutional: He is oriented to person, place, and time. He appears well-developed and well-nourished.  HENT:  Head: Normocephalic and atraumatic.  Eyes: EOM are normal.  Neck: Normal range of motion. Neck supple.  Cardiovascular: Normal rate and regular rhythm.   Pulmonary/Chest: Effort normal and breath sounds normal.  Musculoskeletal: Normal range of motion.  Wound to left thoracic back is healing well with no evidence of infection  Neurological: He is alert and oriented to person, place, and time.  Skin: Skin is warm and dry.  Psychiatric: He has a normal mood and affect. His behavior is normal.  Nursing note and vitals reviewed.   ED Course  Procedures (including critical care time) DIAGNOSTIC STUDIES: Oxygen  Saturation is 96% on RA, normal by my interpretation.    COORDINATION OF CARE: 3:25 PM-Discussed treatment plan which includes staple removal with pt at bedside and pt agreed to plan.   STAPLE REMOVAL Performed by: Joycie Peek, PA-C Authorized by: Bethann Berkshire, MD Consent: Verbal consent obtained. Consent given by: patient Required items: required blood products, implants, devices, and special equipment available  Time out: Immediately prior to procedure a "time out" was called to  verify the correct patient, procedure, equipment, support staff and site/side marked as required. Location: back Wound Appearance: mild crusting with no signs of infection Staples Removed: 7 Post-removal: sterile dressing applied Patient tolerance: Patient tolerated the procedure well with no immediate complications.   Labs Review Labs Reviewed - No data to display  Imaging Review No results found. I have personally reviewed and evaluated these images and lab results as part of my medical decision-making.   EKG Interpretation None     Filed Vitals:   02/26/15 1504  BP: 171/94  Pulse: 81  Temp: 98.1 F (36.7 C)  Resp: 20   Meds given in ED:  Medications - No data to display  New Prescriptions   No medications on file    MDM  Staple removal   Pt to ER for staple/suture removal and wound check as above. Procedure tolerated well. Vitals normal, no signs of infection. Scar minimization & return precautions given at dc.   Final diagnoses:  Encounter for staple removal    I personally performed the services described in this documentation, which was scribed in my presence. The recorded information has been reviewed and is accurate.    Joycie Peek, PA-C 02/26/15 1532  Bethann Berkshire, MD 02/28/15 [phone]

## 2015-02-26 NOTE — ED Notes (Signed)
Declined W/C at D/C and was escorted to lobby by RN. 

## 2015-02-26 NOTE — ED Notes (Signed)
Pt presents today for staple removal . Staples placed on 02-12-15 on back .

## 2015-02-26 NOTE — Discharge Instructions (Signed)
Follow-up with your doctor as needed for reevaluation. Return to ED for any new or worsening symptoms.  Incision Care  An incision (cut) is when a surgeon cuts into your body. After surgery, the cut needs to be well cared for to keep it from getting infected.  HOW TO CARE FOR YOUR CUT  Take medicines only as told by your doctor.  There are many different ways to close and cover a cut, including stitches, skin glue, and adhesive strips. Follow your doctor's instructions on:  Care of the cut.  Bandage (dressing) changes and removal.  Cut closure removal.  Do not take baths, swim, or use a hot tub until your doctor says it is okay. You may shower as told by your doctor.  Return to your normal diet and activities as allowed by your doctor.  Use medicine that helps lessen itching on your cut as told by your doctor. Do not pick or scratch at your cut.  Drink enough fluids to keep your pee (urine) clear or pale yellow. GET HELP IF:  You have redness, puffiness (swelling), or pain at the site of your cut.  You have fluid, blood, or pus coming from your cut.  Your muscles ache.  You have chills or you feel sick.  You have a bad smell coming from the cut or bandage.  Your cut opens up after stitches, staples, or adhesive strips have been removed.  You keep feeling sick to your stomach (nauseous) or keep throwing up (vomiting).  You have a fever.  You are dizzy. GET HELP RIGHT AWAY IF:  You have a rash.  You pass out (faint).  You have trouble breathing. MAKE SURE YOU:   Understand these instructions.  Will watch your condition.  Will get help right away if you are not doing well or get worse.   This information is not intended to replace advice given to you by your health care provider. Make sure you discuss any questions you have with your health care provider.   Document Released: 04/22/2011 Document Revised: 02/18/2014 Document Reviewed: 03/24/2013 Elsevier  Interactive Patient Education [employer]2016 [employer]

## 2016-01-20 ENCOUNTER — Emergency Department (HOSPITAL_COMMUNITY): Payer: Self-pay

## 2016-01-20 ENCOUNTER — Emergency Department (HOSPITAL_COMMUNITY)
Admission: EM | Admit: 2016-01-20 | Discharge: 2016-01-20 | Disposition: A | Discharge status: Home / Self Care | Payer: Self-pay | Attending: Emergency Medicine | Admitting: Emergency Medicine

## 2016-01-20 ENCOUNTER — Encounter (HOSPITAL_COMMUNITY): Payer: Self-pay

## 2016-01-20 DIAGNOSIS — R609 Edema, unspecified: Secondary | ICD-10-CM

## 2016-01-20 DIAGNOSIS — R0689 Other abnormalities of breathing: Secondary | ICD-10-CM | POA: Insufficient documentation

## 2016-01-20 DIAGNOSIS — R600 Localized edema: Secondary | ICD-10-CM | POA: Insufficient documentation

## 2016-01-20 DIAGNOSIS — R079 Chest pain, unspecified: Secondary | ICD-10-CM | POA: Insufficient documentation

## 2016-01-20 DIAGNOSIS — F17210 Nicotine dependence, cigarettes, uncomplicated: Secondary | ICD-10-CM | POA: Insufficient documentation

## 2016-01-20 DIAGNOSIS — I10 Essential (primary) hypertension: Secondary | ICD-10-CM | POA: Insufficient documentation

## 2016-01-20 HISTORY — DX: Essential (primary) hypertension: I10

## 2016-01-20 LAB — BASIC METABOLIC PANEL
Anion gap: 7 (ref 5–15)
BUN: 8 mg/dL (ref 6–20)
CALCIUM: 8.9 mg/dL (ref 8.9–10.3)
CHLORIDE: 106 mmol/L (ref 101–111)
CO2: 27 mmol/L (ref 22–32)
Creatinine, Ser: 0.83 mg/dL (ref 0.61–1.24)
GFR calc Af Amer: >60 mL/min (ref >60)
Glucose, Bld: 91 mg/dL (ref 65–99)
Potassium: 4.5 mmol/L (ref 3.5–5.1)
Sodium: 140 mmol/L (ref 135–145)

## 2016-01-20 LAB — CBC
HEMATOCRIT: 40.2 % (ref 39.0–52.0)
HEMOGLOBIN: 12.9 g/dL — AB (ref 13.0–17.0)
MCH: 26.2 pg (ref 26.0–34.0)
MCHC: 32.1 g/dL (ref 30.0–36.0)
MCV: 81.7 fL (ref 78.0–100.0)
Platelets: 205 10*3/uL (ref 150–400)
RBC: 4.92 MIL/uL (ref 4.22–5.81)
RDW: 16.6 % — ABNORMAL HIGH (ref 11.5–15.5)
WBC: 6 10*3/uL (ref 4.0–10.5)

## 2016-01-20 LAB — I-STAT TROPONIN, ED: TROPONIN I, POC: 0 ng/mL (ref 0.00–0.08)

## 2016-01-20 LAB — BRAIN NATRIURETIC PEPTIDE: B NATRIURETIC PEPTIDE 5: 13.3 pg/mL (ref 0.0–100.0)

## 2016-01-20 MED ORDER — HYDROCHLOROTHIAZIDE 12.5 MG PO TABS: 12.5000 mg | ORAL_TABLET | Freq: Every day | ORAL | 0 refills | Status: DC

## 2016-01-20 NOTE — Discharge Instructions (Signed)
Work-up today looked good.  No acute findings. Take the prescribed medication as directed.  This will help with your blood pressure as well as your peripheral edema. Follow-up with [facility]--- call to make appt. Return to the ED for new or worsening symptoms.

## 2016-01-20 NOTE — ED Provider Notes (Signed)
[facility] DEPT Provider Note   CSN: [id number] Arrival date & time: 01/20/16  1050     History   Chief Complaint Chief Complaint  Patient presents with  . Chest Pain  . Leg Swelling    HPI Jared Mccoy is a 25 y.o. male.  The history is provided by the patient and the spouse.  Chest Pain      25 year old male with history of hypertension, presenting to the ED for chest pain. Patient was released from prison 3 months ago after a long period of incarceration for unknown reasons. Patient states while incarcerated he was on several medications including Cozaar and Lasix. States he ran out these medications about 3 weeks ago. Reports this morning he had a brief episode of left-sided chest pain which she described as sharp. There is no associated shortness of breath, diaphoresis, nausea, or vomiting. States pain resolved spontaneously after a few minutes without intervention. States currently he feels fine. He does report some was seems to be chronic lower extremity edema. States it is typical for this to be asymmetric and one leg is larger than the other, currently left leg seems worse. He has not had any calf pain, redness, prolonged travel, or prolonged immobilization. He does not have any history of DVT or PE. He is a daily smoker. Father just had an MI last year at age 49. Patient is not currently established with a cardiologist.  He does not take any type of anticoagulation.  Past Medical History:  Diagnosis Date  . Hypertension   . Obese     There are no active problems to display for this patient.   History reviewed. No pertinent surgical history.  OB History    Gravida Para Term Preterm AB Living   0 0 0 0 0     SAB TAB Ectopic Multiple Live Births   0 0 0           Home Medications    Prior to Admission medications   Medication Sig Start Date End Date Taking? Authorizing Provider  bacitracin ointment Apply 1 application topically 2 (two) times daily. Apply  to wound 02/12/15   Antony Madura, PA-C  cephALEXin (KEFLEX) 500 MG capsule Take 1 capsule (500 mg total) by mouth 4 (four) times daily. 02/12/15   Antony Madura, PA-C  cyclobenzaprine (FLEXERIL) 10 MG tablet Take 1 tablet (10 mg total) by mouth 2 (two) times daily as needed for muscle spasms. Patient not taking: Reported on 08/27/2014 08/18/13   Junious Silk, PA-C  furosemide (LASIX) 20 MG tablet Take 1 tablet (20 mg total) by mouth daily. 08/27/14   Kristen N Ward, DO  furosemide (LASIX) 20 MG tablet Take 1 tablet (20 mg total) by mouth daily. X 7 days 12/18/14   Mercedes Camprubi-Soms, PA-C  furosemide (LASIX) 40 MG tablet Take 40 mg by mouth daily.    Historical Provider, MD  OVER THE COUNTER MEDICATION Take 3 tablets by mouth daily as needed (Fluid). Fluid pills (brown tablets)    Historical Provider, MD  oxyCODONE-acetaminophen (PERCOCET/ROXICET) 5-325 MG tablet Take 1-2 tablets by mouth every 6 (six) hours as needed for severe pain. 02/12/15   Antony Madura, PA-C    Family History No family history on file.  Social History Social History  Substance Use Topics  . Smoking status: Current Every Day Smoker    Packs/day: 1.00    Types: Cigarettes  . Smokeless tobacco: Never Used  . Alcohol use Yes  Comment: rare     Allergies   Patient has no known allergies.   Review of Systems Review of Systems  Cardiovascular: Positive for chest pain and leg swelling (edema).  All other systems reviewed and are negative.    Physical Exam Updated Vital Signs BP 171/94 (BP Location: Left Arm)   Pulse 68   Temp 98 F (36.7 C) (Oral)   Resp 16   Ht 6' (1.829 m)   Wt (!) 199.6 kg   SpO2 97%   BMI 59.67 kg/m   Physical Exam  Constitutional: He is oriented to person, place, and time. He appears well-developed and well-nourished.  Morbidly obese  HENT:  Head: Normocephalic and atraumatic.  Mouth/Throat: Oropharynx is clear and moist.  Eyes: Conjunctivae and EOM are normal. Pupils are equal,  round, and reactive to light.  Neck: Normal range of motion.  Cardiovascular: Normal rate, regular rhythm and normal heart sounds.   Pulmonary/Chest: Effort normal and breath sounds normal. He has no wheezes. He has no rhonchi.  Abdominal: Soft. Bowel sounds are normal.  Musculoskeletal: Normal range of motion.  Bilateral 2+ peripheral edema noted, left leg appears to have some lymphedema as well   Neurological: He is alert and oriented to person, place, and time.  Skin: Skin is warm and dry.  Psychiatric: He has a normal mood and affect.  Nursing note and vitals reviewed.    ED Treatments / Results  Labs (all labs ordered are listed, but only abnormal results are displayed) Labs Reviewed  CBC - Abnormal; Notable for the following:       Result Value   Hemoglobin 12.9 (*)    RDW 16.6 (*)    All other components within normal limits  BASIC METABOLIC PANEL  BRAIN NATRIURETIC PEPTIDE  I-STAT TROPOININ, ED    EKG  EKG Interpretation  Date/Time:  Saturday January 20 2016 11:12:00 EST Ventricular Rate:  65 PR Interval:  142 QRS Duration: 82 QT Interval:  408 QTC Calculation: 424 R Axis:   74 Text Interpretation:  Normal sinus rhythm Nonspecific ST and T wave abnormality Abnormal ECG Since last tracing, twave flattening is improved/resolved Otherwise no significant change Confirmed by Erma HeritageISAACS MD, Sheria LangAMERON [phone]([id number]) on 01/20/2016 1:52:53 PM       Radiology Dg Chest 2 View  Result Date: 01/20/2016 CLINICAL DATA:  BILATERAL lower extremity swelling for 1 year, centrally located chest discomfort this morning which has resolved, history hypertension, smoking EXAM: CHEST  2 VIEW COMPARISON:  07/12/2012 FINDINGS: Upper normal heart size. Mediastinal contours and pulmonary vascularity normal. Lungs clear. No pleural effusion or pneumothorax. Bones unremarkable. IMPRESSION: No acute abnormalities. Electronically Signed   By: Ulyses SouthwardMark  Boles M.D.   On: 01/20/2016 12:40     Procedures Procedures (including critical care time)  Medications Ordered in ED Medications - No data to display   Initial Impression / Assessment and Plan / ED Course  I have reviewed the triage vital signs and the nursing notes.  Pertinent labs & imaging results that were available during my care of the patient were reviewed by me and considered in my medical decision making (see chart for details).  Clinical Course    25 year old male here with episode of chest pain this morning and lower extremity edema. Chest pain resolved spontaneously, no chest pain or shortness of breath currently. EKG is nonischemic. Patient does have peripheral edema on exam, 2+ bilaterally. Left leg does appear to have some lymphedema. There is no weeping or drainage. Screening lab  work is reassuring, troponin is negative. BNP within normal limits. Chest x-ray is clear. Patient does have hypertension, this is mild in the ED today. There are no signs of end organ damage. Low suspicion for ACS.  Low risk heart score of 3 (RF-- smoking, family hx, HTN and non-specific ST-T changes on EKG).  Patient is PERC negative.  Will start on HCTZ for blood pressure control and to help with peripheral edema. Patient was given follow-up with wellness clinic as he does not currently have a PCP.  Discussed plan with patient and wife, they acknowledged understanding and agreed with plan of care.  Return precautions given for new or worsening symptoms.  Final Clinical Impressions(s) / ED Diagnoses   Final diagnoses:  Chest pain, unspecified type  Peripheral edema  Essential hypertension    New Prescriptions New Prescriptions   HYDROCHLOROTHIAZIDE (HYDRODIURIL) 12.5 MG TABLET    Take 1 tablet (12.5 mg total) by mouth daily.     Garlon HatchetLisa M Sanders, PA-C 01/20/16 1511    Shaune Pollackameron Isaacs, MD 01/21/16 [phone]

## 2016-01-20 NOTE — ED Notes (Signed)
Spoke with lab they will add BNP to blood specimens they have

## 2016-01-20 NOTE — ED Triage Notes (Signed)
Patient complains of bilateral lower leg swelling for 1 year. Concerned because he had some chest discomfort this am that had resolved pta. Alert and oriented, NAD. Has not been taking his BP meds as prescribed

## 2016-02-03 ENCOUNTER — Encounter (HOSPITAL_COMMUNITY): Payer: Self-pay | Admitting: Emergency Medicine

## 2016-02-03 ENCOUNTER — Emergency Department (HOSPITAL_COMMUNITY)
Admission: EM | Admit: 2016-02-03 | Discharge: 2016-02-03 | Disposition: A | Discharge status: Home / Self Care | Payer: Self-pay | Attending: Emergency Medicine | Admitting: Emergency Medicine

## 2016-02-03 DIAGNOSIS — I10 Essential (primary) hypertension: Secondary | ICD-10-CM | POA: Insufficient documentation

## 2016-02-03 DIAGNOSIS — J029 Acute pharyngitis, unspecified: Secondary | ICD-10-CM | POA: Insufficient documentation

## 2016-02-03 DIAGNOSIS — F17210 Nicotine dependence, cigarettes, uncomplicated: Secondary | ICD-10-CM | POA: Insufficient documentation

## 2016-02-03 LAB — RAPID STREP SCREEN (MED CTR MEBANE ONLY): Streptococcus, Group A Screen (Direct): NEGATIVE

## 2016-02-03 MED ORDER — PENICILLIN G BENZATHINE 1200000 UNIT/2ML IM SUSP
1.2000 10*6.[IU] | Freq: Once | INTRAMUSCULAR | Status: AC
  Administered 2016-02-03: 1.2 10*6.[IU] via INTRAMUSCULAR
  Filled 2016-02-03: qty 2

## 2016-02-03 NOTE — ED Triage Notes (Signed)
Pt states "my throat is swollen and its painful to swallow". Pt is able to swallow but its very painful.

## 2016-02-03 NOTE — ED Provider Notes (Signed)
[facility] Provider Note   CSN: [id number] Arrival date & time: 02/03/16  1719   By signing my name below, I, Nelwyn SalisburyJoshua Fowler, attest that this documentation has been prepared under the direction and in the presence of non-physician practitioner, Roxy Horsemanobert Oliverio Cho, PA-C. Electronically Signed: Nelwyn SalisburyJoshua Fowler, Scribe. 02/03/2016. 6:48 PM.   History   Chief Complaint Chief Complaint  Patient presents with  . Sore Throat   The history is provided by the patient. No language interpreter was used.    Jared Mccoy is an 25 y.o. male with pmhx of HTN who presents to the Emergency Department with a chief complaint of sudden-onset constant cough beginning 3 days ago. No modifying factors indicated. Pt has tried OTC cough drops, throat spray, and robitussin with no relief. He denies any fever or cough.    Past Medical History:  Diagnosis Date  . Hypertension   . Obese     There are no active problems to display for this patient.   History reviewed. No pertinent surgical history.  OB History    Gravida Para Term Preterm AB Living   0 0 0 0 0     SAB TAB Ectopic Multiple Live Births   0 0 0           Home Medications    Prior to Admission medications   Medication Sig Start Date End Date Taking? Authorizing Provider  bacitracin ointment Apply 1 application topically 2 (two) times daily. Apply to wound 02/12/15   Antony MaduraKelly Humes, PA-C  cephALEXin (KEFLEX) 500 MG capsule Take 1 capsule (500 mg total) by mouth 4 (four) times daily. 02/12/15   Antony MaduraKelly Humes, PA-C  cyclobenzaprine (FLEXERIL) 10 MG tablet Take 1 tablet (10 mg total) by mouth 2 (two) times daily as needed for muscle spasms. Patient not taking: Reported on 08/27/2014 08/18/13   Junious SilkHannah Merrell, PA-C  furosemide (LASIX) 20 MG tablet Take 1 tablet (20 mg total) by mouth daily. 08/27/14   Kristen N Ward, DO  furosemide (LASIX) 20 MG tablet Take 1 tablet (20 mg total) by mouth daily. X 7 days 12/18/14   Mercedes Camprubi-Soms, PA-C    furosemide (LASIX) 40 MG tablet Take 40 mg by mouth daily.    Historical Provider, MD  hydrochlorothiazide (HYDRODIURIL) 12.5 MG tablet Take 1 tablet (12.5 mg total) by mouth daily. 01/20/16   Garlon HatchetLisa M Sanders, PA-C  OVER THE COUNTER MEDICATION Take 3 tablets by mouth daily as needed (Fluid). Fluid pills (brown tablets)    Historical Provider, MD  oxyCODONE-acetaminophen (PERCOCET/ROXICET) 5-325 MG tablet Take 1-2 tablets by mouth every 6 (six) hours as needed for severe pain. 02/12/15   Antony MaduraKelly Humes, PA-C    Family History No family history on file.  Social History Social History  Substance Use Topics  . Smoking status: Current Every Day Smoker    Packs/day: 1.00    Types: Cigarettes  . Smokeless tobacco: Never Used  . Alcohol use Yes     Comment: rare     Allergies   Patient has no known allergies.   Review of Systems Review of Systems  Constitutional: Negative for fever.  HENT: Positive for sore throat.   Respiratory: Negative for cough.      Physical Exam Updated Vital Signs BP 146/74   Pulse 98   Temp 99.1 F (37.3 C) (Oral)   Resp 16   SpO2 98%   Physical Exam Physical Exam  Constitutional: Pt  appears well-developed and well-nourished. No distress.  HENT:  Head: Normocephalic and atraumatic.  Right Ear: Tympanic membrane, external ear and ear canal normal.  Left Ear: Tympanic membrane, external ear and ear canal normal.  Nose: Nose normal. No mucosal edema or rhinorrhea.  Mouth/Throat: Uvula is midline and mucous membranes are normal. Mucous membranes are not dry. No trismus in the jaw. No uvula swelling. Oropharyngeal exudate, posterior oropharyngeal edema and posterior oropharyngeal erythema present. No tonsillar/peritonsillar abscesses.   Eyes: Conjunctivae are normal.  Neck: Normal range of motion, full passive range of motion without pain and phonation normal. No tracheal tenderness, no spinous process tenderness and no muscular tenderness present. No  rigidity. No erythema and normal range of motion present. No Brudzinski's sign and no Kernig's sign noted.  Normal phonation No stridor Handling secretions without difficulty Pulmonary/Chest: Effort normal and breath sounds normal. No stridor. No respiratory distress. No decreased breath sounds. No wheezes.  Musculoskeletal: Normal range of motion.  Neurological: Pt is alert.  Alert and oriented Moves all extremities without ataxia  Skin: Skin is warm and dry. Pt is not diaphoretic.  Psychiatric: Normal mood and affect.  Nursing note and vitals reviewed.  ED Treatments / Results  DIAGNOSTIC STUDIES:  Oxygen Saturation is 98% on RA, normal by my interpretation.    COORDINATION OF CARE:  6:50 PM Discussed treatment plan with pt at bedside which includes penicillin IM and pt agreed to plan.  Labs (all labs ordered are listed, but only abnormal results are displayed) Labs Reviewed  RAPID STREP SCREEN (NOT [facility])  CULTURE, GROUP A STREP [facility])    EKG  EKG Interpretation None       Radiology No results found.  Procedures Procedures (including critical care time)  Medications Ordered in ED Medications - No data to display   Initial Impression / Assessment and Plan / ED Course  I have reviewed the triage vital signs and the nursing notes.  Pertinent labs & imaging results that were available during my care of the patient were reviewed by me and considered in my medical decision making (see chart for details).  Clinical Course     No evidence of abscess or deep space infection.  Well-appearing.  Final Clinical Impressions(s) / ED Diagnoses   Final diagnoses:  None    New Prescriptions New Prescriptions   No medications on file   I personally performed the services described in this documentation, which was scribed in my presence. The recorded information has been reviewed and is accurate.       Roxy HorsemanRobert Marceil Welp, PA-C 02/03/16 1913    Nira ConnPedro Eduardo  Cardama, MD 02/04/16 [id number]

## 2016-02-03 NOTE — ED Notes (Signed)
Declined W/C at D/C and was escorted to lobby by RN. 

## 2016-02-05 LAB — CULTURE, GROUP A STREP (THRC)

## 2016-11-09 ENCOUNTER — Inpatient Hospital Stay (HOSPITAL_COMMUNITY)
Admission: EM | Admit: 2016-11-09 | Discharge: 2016-11-12 | DRG: 463 | Disposition: A | Discharge status: Home / Self Care | Payer: No Typology Code available for payment source | Attending: General Surgery | Admitting: General Surgery

## 2016-11-09 ENCOUNTER — Emergency Department (HOSPITAL_COMMUNITY): Payer: No Typology Code available for payment source

## 2016-11-09 ENCOUNTER — Encounter (HOSPITAL_COMMUNITY): Payer: Self-pay | Admitting: Emergency Medicine

## 2016-11-09 DIAGNOSIS — F17210 Nicotine dependence, cigarettes, uncomplicated: Secondary | ICD-10-CM | POA: Diagnosis present

## 2016-11-09 DIAGNOSIS — Z79891 Long term (current) use of opiate analgesic: Secondary | ICD-10-CM

## 2016-11-09 DIAGNOSIS — S02609A Fracture of mandible, unspecified, initial encounter for closed fracture: Secondary | ICD-10-CM

## 2016-11-09 DIAGNOSIS — Y92410 Unspecified street and highway as the place of occurrence of the external cause: Secondary | ICD-10-CM

## 2016-11-09 DIAGNOSIS — S01311A Laceration without foreign body of right ear, initial encounter: Secondary | ICD-10-CM

## 2016-11-09 DIAGNOSIS — I10 Essential (primary) hypertension: Secondary | ICD-10-CM | POA: Diagnosis present

## 2016-11-09 DIAGNOSIS — T07XXXA Unspecified multiple injuries, initial encounter: Secondary | ICD-10-CM | POA: Diagnosis present

## 2016-11-09 DIAGNOSIS — S62102B Fracture of unspecified carpal bone, left wrist, initial encounter for open fracture: Secondary | ICD-10-CM

## 2016-11-09 DIAGNOSIS — S52552B Other extraarticular fracture of lower end of left radius, initial encounter for open fracture type I or II: Principal | ICD-10-CM | POA: Diagnosis present

## 2016-11-09 DIAGNOSIS — S01312A Laceration without foreign body of left ear, initial encounter: Secondary | ICD-10-CM | POA: Diagnosis present

## 2016-11-09 DIAGNOSIS — Z09 Encounter for follow-up examination after completed treatment for conditions other than malignant neoplasm: Secondary | ICD-10-CM

## 2016-11-09 DIAGNOSIS — S81812A Laceration without foreign body, left lower leg, initial encounter: Secondary | ICD-10-CM | POA: Diagnosis present

## 2016-11-09 DIAGNOSIS — E876 Hypokalemia: Secondary | ICD-10-CM

## 2016-11-09 DIAGNOSIS — J9601 Acute respiratory failure with hypoxia: Secondary | ICD-10-CM | POA: Diagnosis not present

## 2016-11-09 DIAGNOSIS — G8918 Other acute postprocedural pain: Secondary | ICD-10-CM

## 2016-11-09 DIAGNOSIS — Z6841 Body Mass Index (BMI) 40.0 and over, adult: Secondary | ICD-10-CM

## 2016-11-09 DIAGNOSIS — S0266XA Fracture of symphysis of mandible, initial encounter for closed fracture: Secondary | ICD-10-CM | POA: Diagnosis present

## 2016-11-09 MED ORDER — TETANUS-DIPHTH-ACELL PERTUSSIS 5-2.5-18.5 LF-MCG/0.5 IM SUSP
0.5000 mL | Freq: Once | INTRAMUSCULAR | Status: AC
  Administered 2016-11-10: 0.5 mL via INTRAMUSCULAR
  Filled 2016-11-09: qty 0.5

## 2016-11-09 MED ORDER — SODIUM CHLORIDE 0.9 % IV BOLUS (SEPSIS)
1000.0000 mL | Freq: Once | INTRAVENOUS | Status: AC
  Administered 2016-11-10: 1000 mL via INTRAVENOUS

## 2016-11-09 MED ORDER — IOPAMIDOL (ISOVUE-300) INJECTION 61%
INTRAVENOUS | Status: AC
  Administered 2016-11-10: 100 mL
  Filled 2016-11-09: qty 100

## 2016-11-09 MED ORDER — SODIUM CHLORIDE 0.9 % IV SOLN
INTRAVENOUS | Status: DC
  Administered 2016-11-10 – 2016-11-12 (×2): via INTRAVENOUS

## 2016-11-09 MED ORDER — HYDROMORPHONE HCL 1 MG/ML IJ SOLN
1.0000 mg | Freq: Once | INTRAMUSCULAR | Status: AC
  Administered 2016-11-09: 1 mg via INTRAVENOUS
  Filled 2016-11-09: qty 1

## 2016-11-09 MED ORDER — ONDANSETRON HCL 4 MG/2ML IJ SOLN
4.0000 mg | Freq: Once | INTRAMUSCULAR | Status: AC
  Administered 2016-11-09: 4 mg via INTRAVENOUS
  Filled 2016-11-09: qty 2
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
## 2016-11-10 ENCOUNTER — Other Ambulatory Visit (HOSPITAL_COMMUNITY): Payer: Self-pay

## 2016-11-10 ENCOUNTER — Inpatient Hospital Stay (HOSPITAL_COMMUNITY): Payer: No Typology Code available for payment source | Admitting: Anesthesiology

## 2016-11-10 ENCOUNTER — Emergency Department (HOSPITAL_COMMUNITY): Payer: No Typology Code available for payment source

## 2016-11-10 ENCOUNTER — Encounter (HOSPITAL_COMMUNITY): Payer: Self-pay | Admitting: Anesthesiology

## 2016-11-10 ENCOUNTER — Encounter (HOSPITAL_COMMUNITY): Admission: EM | Disposition: A | Discharge status: Home / Self Care | Payer: Self-pay | Source: Home / Self Care

## 2016-11-10 ENCOUNTER — Inpatient Hospital Stay (HOSPITAL_COMMUNITY): Payer: No Typology Code available for payment source

## 2016-11-10 DIAGNOSIS — E876 Hypokalemia: Secondary | ICD-10-CM | POA: Diagnosis present

## 2016-11-10 DIAGNOSIS — S01311A Laceration without foreign body of right ear, initial encounter: Secondary | ICD-10-CM | POA: Diagnosis present

## 2016-11-10 DIAGNOSIS — F17210 Nicotine dependence, cigarettes, uncomplicated: Secondary | ICD-10-CM | POA: Diagnosis present

## 2016-11-10 DIAGNOSIS — S01312A Laceration without foreign body of left ear, initial encounter: Secondary | ICD-10-CM | POA: Diagnosis present

## 2016-11-10 DIAGNOSIS — I10 Essential (primary) hypertension: Secondary | ICD-10-CM | POA: Diagnosis present

## 2016-11-10 DIAGNOSIS — S0266XA Fracture of symphysis of mandible, initial encounter for closed fracture: Secondary | ICD-10-CM | POA: Diagnosis present

## 2016-11-10 DIAGNOSIS — S02609A Fracture of mandible, unspecified, initial encounter for closed fracture: Secondary | ICD-10-CM | POA: Diagnosis present

## 2016-11-10 DIAGNOSIS — Z79891 Long term (current) use of opiate analgesic: Secondary | ICD-10-CM | POA: Diagnosis not present

## 2016-11-10 DIAGNOSIS — S81812A Laceration without foreign body, left lower leg, initial encounter: Secondary | ICD-10-CM | POA: Diagnosis present

## 2016-11-10 DIAGNOSIS — Z6841 Body Mass Index (BMI) 40.0 and over, adult: Secondary | ICD-10-CM | POA: Diagnosis not present

## 2016-11-10 DIAGNOSIS — T07XXXA Unspecified multiple injuries, initial encounter: Secondary | ICD-10-CM | POA: Diagnosis present

## 2016-11-10 DIAGNOSIS — J9601 Acute respiratory failure with hypoxia: Secondary | ICD-10-CM | POA: Diagnosis not present

## 2016-11-10 DIAGNOSIS — S52552B Other extraarticular fracture of lower end of left radius, initial encounter for open fracture type I or II: Secondary | ICD-10-CM | POA: Diagnosis present

## 2016-11-10 DIAGNOSIS — Y92410 Unspecified street and highway as the place of occurrence of the external cause: Secondary | ICD-10-CM | POA: Diagnosis not present

## 2016-11-10 HISTORY — PX: ORIF MANDIBULAR FRACTURE: SHX2127

## 2016-11-10 HISTORY — PX: OPEN REDUCTION INTERNAL FIXATION (ORIF) DISTAL RADIAL FRACTURE: SHX5989

## 2016-11-10 LAB — MRSA PCR SCREENING: MRSA BY PCR: NEGATIVE

## 2016-11-10 LAB — I-STAT CHEM 8, ED
BUN: 7 mg/dL (ref 6–20)
CALCIUM ION: 0.99 mmol/L — AB (ref 1.15–1.40)
CHLORIDE: 99 mmol/L — AB (ref 101–111)
Creatinine, Ser: 1 mg/dL (ref 0.61–1.24)
Glucose, Bld: 124 mg/dL — ABNORMAL HIGH (ref 65–99)
HEMATOCRIT: 41 % (ref 39.0–52.0)
Hemoglobin: 13.9 g/dL (ref 13.0–17.0)
POTASSIUM: 2.6 mmol/L — AB (ref 3.5–5.1)
SODIUM: 142 mmol/L (ref 135–145)
TCO2: 29 mmol/L (ref 22–32)

## 2016-11-10 LAB — CBC
HCT: 38.7 % — ABNORMAL LOW (ref 39.0–52.0)
HEMATOCRIT: 40.3 % (ref 39.0–52.0)
HEMOGLOBIN: 12.8 g/dL — AB (ref 13.0–17.0)
HEMOGLOBIN: 12.3 g/dL — AB (ref 13.0–17.0)
MCH: 26.5 pg (ref 26.0–34.0)
MCH: 26.6 pg (ref 26.0–34.0)
MCHC: 31.8 g/dL (ref 30.0–36.0)
MCHC: 31.8 g/dL (ref 30.0–36.0)
MCV: 83.4 fL (ref 78.0–100.0)
MCV: 83.6 fL (ref 78.0–100.0)
PLATELETS: 204 10*3/uL (ref 150–400)
Platelets: 202 10*3/uL (ref 150–400)
RBC: 4.83 MIL/uL (ref 4.22–5.81)
RBC: 4.63 MIL/uL (ref 4.22–5.81)
RDW: 16.6 % — AB (ref 11.5–15.5)
RDW: 16.9 % — AB (ref 11.5–15.5)
WBC: 10.2 10*3/uL (ref 4.0–10.5)
WBC: 11.4 10*3/uL — ABNORMAL HIGH (ref 4.0–10.5)

## 2016-11-10 LAB — COMPREHENSIVE METABOLIC PANEL
ALK PHOS: 65 U/L (ref 38–126)
ALT: 38 U/L (ref 17–63)
ANION GAP: 11 (ref 5–15)
AST: 54 U/L — ABNORMAL HIGH (ref 15–41)
Albumin: 3.7 g/dL (ref 3.5–5.0)
BUN: 6 mg/dL (ref 6–20)
CALCIUM: 7.8 mg/dL — AB (ref 8.9–10.3)
CO2: 24 mmol/L (ref 22–32)
Chloride: 98 mmol/L — ABNORMAL LOW (ref 101–111)
Creatinine, Ser: 1.09 mg/dL (ref 0.61–1.24)
GFR calc Af Amer: >60 mL/min (ref >60)
GFR calc non Af Amer: >60 mL/min (ref >60)
GLUCOSE: 128 mg/dL — AB (ref 65–99)
Potassium: 2.5 mmol/L — CL (ref 3.5–5.1)
SODIUM: 133 mmol/L — AB (ref 135–145)
TOTAL PROTEIN: 6.7 g/dL (ref 6.5–8.1)
Total Bilirubin: 0.6 mg/dL (ref 0.3–1.2)

## 2016-11-10 LAB — URINALYSIS, ROUTINE W REFLEX MICROSCOPIC
Bacteria, UA: NONE SEEN
Bilirubin Urine: NEGATIVE
GLUCOSE, UA: NEGATIVE mg/dL
Hgb urine dipstick: NEGATIVE
KETONES UR: NEGATIVE mg/dL
Leukocytes, UA: NEGATIVE
Nitrite: NEGATIVE
PROTEIN: 30 mg/dL — AB
Specific Gravity, Urine: 1.012 (ref 1.005–1.030)
pH: 6 (ref 5.0–8.0)

## 2016-11-10 LAB — BASIC METABOLIC PANEL
Anion gap: 9 (ref 5–15)
BUN: 6 mg/dL (ref 6–20)
CALCIUM: 7.6 mg/dL — AB (ref 8.9–10.3)
CHLORIDE: 102 mmol/L (ref 101–111)
CO2: 27 mmol/L (ref 22–32)
CREATININE: 1.04 mg/dL (ref 0.61–1.24)
GFR calc Af Amer: >60 mL/min (ref >60)
Glucose, Bld: 126 mg/dL — ABNORMAL HIGH (ref 65–99)
Potassium: 4.6 mmol/L (ref 3.5–5.1)
SODIUM: 138 mmol/L (ref 135–145)

## 2016-11-10 LAB — I-STAT CG4 LACTIC ACID, ED: LACTIC ACID, VENOUS: 1.53 mmol/L (ref 0.5–1.9)

## 2016-11-10 LAB — ETHANOL: Alcohol, Ethyl (B): <10 mg/dL (ref <10)

## 2016-11-10 LAB — PROTIME-INR
INR: 0.98
PROTHROMBIN TIME: 12.9 s (ref 11.4–15.2)

## 2016-11-10 LAB — TROPONIN I

## 2016-11-10 SURGERY — OPEN REDUCTION INTERNAL FIXATION (ORIF) DISTAL RADIUS FRACTURE
Anesthesia: General | Laterality: Right

## 2016-11-10 SURGERY — OPEN REDUCTION INTERNAL FIXATION (ORIF) MANDIBULAR FRACTURE
Anesthesia: General

## 2016-11-10 MED ORDER — LIDOCAINE HCL (PF) 1 % IJ SOLN
INTRAMUSCULAR | Status: DC | PRN
Start: 2016-11-10 — End: 2016-11-10
  Administered 2016-11-10: 8 mL

## 2016-11-10 MED ORDER — LIDOCAINE HCL (CARDIAC) 20 MG/ML IV SOLN
INTRAVENOUS | Status: DC | PRN
  Administered 2016-11-10: 60 mg via INTRAVENOUS

## 2016-11-10 MED ORDER — PROMETHAZINE HCL 25 MG/ML IJ SOLN: 6.2500 mg | INTRAMUSCULAR | Status: DC | PRN

## 2016-11-10 MED ORDER — ROCURONIUM BROMIDE 100 MG/10ML IV SOLN
INTRAVENOUS | Status: DC | PRN
  Administered 2016-11-10: 40 mg via INTRAVENOUS
  Administered 2016-11-10: 20 mg via INTRAVENOUS

## 2016-11-10 MED ORDER — CLINDAMYCIN PHOSPHATE 300 MG/50ML IV SOLN
300.0000 mg | Freq: Three times a day (TID) | INTRAVENOUS | Status: DC
  Administered 2016-11-10 – 2016-11-12 (×6): 300 mg via INTRAVENOUS
  Filled 2016-11-10 (×7): qty 50

## 2016-11-10 MED ORDER — FENTANYL CITRATE (PF) 100 MCG/2ML IJ SOLN
INTRAMUSCULAR | Status: DC | PRN
  Administered 2016-11-10 (×3): 50 ug via INTRAVENOUS
  Administered 2016-11-10: 100 ug via INTRAVENOUS

## 2016-11-10 MED ORDER — MORPHINE SULFATE (PF) 4 MG/ML IV SOLN
1.0000 mg | INTRAVENOUS | Status: DC | PRN
  Filled 2016-11-10: qty 1

## 2016-11-10 MED ORDER — OXYMETAZOLINE HCL 0.05 % NA SOLN
NASAL | Status: AC
  Filled 2016-11-10: qty 15

## 2016-11-10 MED ORDER — PROPOFOL 10 MG/ML IV BOLUS
INTRAVENOUS | Status: AC
  Filled 2016-11-10: qty 20

## 2016-11-10 MED ORDER — ALBUTEROL SULFATE HFA 108 (90 BASE) MCG/ACT IN AERS
INHALATION_SPRAY | RESPIRATORY_TRACT | Status: DC | PRN
  Administered 2016-11-10: 4 via RESPIRATORY_TRACT

## 2016-11-10 MED ORDER — PROPOFOL 10 MG/ML IV BOLUS
INTRAVENOUS | Status: DC | PRN
Start: 2016-11-10 — End: 2016-11-10
  Administered 2016-11-10: 200 mg via INTRAVENOUS
  Administered 2016-11-10: 20 mg via INTRAVENOUS

## 2016-11-10 MED ORDER — 0.9 % SODIUM CHLORIDE (POUR BTL) OPTIME
TOPICAL | Status: DC | PRN
  Administered 2016-11-10: 1000 mL

## 2016-11-10 MED ORDER — ORAL CARE MOUTH RINSE
15.0000 mL | Freq: Two times a day (BID) | OROMUCOSAL | Status: DC
  Administered 2016-11-10 (×2): 15 mL via OROMUCOSAL

## 2016-11-10 MED ORDER — LIDOCAINE-EPINEPHRINE 1 %-1:100000 IJ SOLN
INTRAMUSCULAR | Status: AC
  Filled 2016-11-10: qty 1

## 2016-11-10 MED ORDER — ALBUTEROL SULFATE (2.5 MG/3ML) 0.083% IN NEBU: 2.5000 mg | INHALATION_SOLUTION | Freq: Four times a day (QID) | RESPIRATORY_TRACT | Status: DC | PRN

## 2016-11-10 MED ORDER — ONDANSETRON HCL 4 MG/2ML IJ SOLN
INTRAMUSCULAR | Status: DC | PRN
  Administered 2016-11-10: 4 mg via INTRAVENOUS

## 2016-11-10 MED ORDER — MIDAZOLAM HCL 5 MG/5ML IJ SOLN
INTRAMUSCULAR | Status: DC | PRN
  Administered 2016-11-10 (×2): 1 mg via INTRAVENOUS

## 2016-11-10 MED ORDER — CEFAZOLIN SODIUM-DEXTROSE 2-3 GM-% IV SOLR
INTRAVENOUS | Status: DC | PRN
  Administered 2016-11-10: 2 g via INTRAVENOUS

## 2016-11-10 MED ORDER — FUROSEMIDE 10 MG/ML IJ SOLN
INTRAMUSCULAR | Status: AC
  Filled 2016-11-10: qty 4

## 2016-11-10 MED ORDER — SUGAMMADEX SODIUM 500 MG/5ML IV SOLN
INTRAVENOUS | Status: DC | PRN
  Administered 2016-11-10: 400 mg via INTRAVENOUS

## 2016-11-10 MED ORDER — HYDROMORPHONE HCL 1 MG/ML IJ SOLN: 1.0000 mg | INTRAMUSCULAR | Status: DC | PRN

## 2016-11-10 MED ORDER — HYDROMORPHONE HCL 1 MG/ML IJ SOLN
1.0000 mg | Freq: Once | INTRAMUSCULAR | Status: AC
  Administered 2016-11-10: 1 mg via INTRAVENOUS
  Filled 2016-11-10: qty 1

## 2016-11-10 MED ORDER — LACTATED RINGERS IV SOLN
INTRAVENOUS | Status: DC | PRN
  Administered 2016-11-10 (×3): via INTRAVENOUS

## 2016-11-10 MED ORDER — ONDANSETRON HCL 4 MG/2ML IJ SOLN
INTRAMUSCULAR | Status: AC
  Filled 2016-11-10: qty 2

## 2016-11-10 MED ORDER — SUGAMMADEX SODIUM 500 MG/5ML IV SOLN
INTRAVENOUS | Status: AC
  Filled 2016-11-10: qty 5

## 2016-11-10 MED ORDER — FENTANYL CITRATE (PF) 250 MCG/5ML IJ SOLN
INTRAMUSCULAR | Status: AC
  Filled 2016-11-10: qty 5

## 2016-11-10 MED ORDER — MIDAZOLAM HCL 2 MG/2ML IJ SOLN
INTRAMUSCULAR | Status: AC
  Filled 2016-11-10: qty 2

## 2016-11-10 MED ORDER — HYDRALAZINE HCL 20 MG/ML IJ SOLN
10.0000 mg | Freq: Four times a day (QID) | INTRAMUSCULAR | Status: DC | PRN
  Administered 2016-11-10 – 2016-11-11 (×2): 10 mg via INTRAVENOUS
  Filled 2016-11-10 (×2): qty 1

## 2016-11-10 MED ORDER — MORPHINE SULFATE (PF) 4 MG/ML IV SOLN
2.0000 mg | INTRAVENOUS | Status: DC | PRN
  Administered 2016-11-10 – 2016-11-11 (×3): 2 mg via INTRAVENOUS
  Filled 2016-11-10 (×2): qty 1

## 2016-11-10 MED ORDER — DEXMEDETOMIDINE HCL 200 MCG/2ML IV SOLN
INTRAVENOUS | Status: DC | PRN
  Administered 2016-11-10: 20 ug via INTRAVENOUS

## 2016-11-10 MED ORDER — ALBUTEROL SULFATE (2.5 MG/3ML) 0.083% IN NEBU
INHALATION_SOLUTION | RESPIRATORY_TRACT | Status: AC
  Administered 2016-11-10: 2.5 mg
  Filled 2016-11-10: qty 6

## 2016-11-10 MED ORDER — SUCCINYLCHOLINE CHLORIDE 20 MG/ML IJ SOLN
INTRAMUSCULAR | Status: DC | PRN
  Administered 2016-11-10: 200 mg via INTRAVENOUS

## 2016-11-10 MED ORDER — FENTANYL CITRATE (PF) 100 MCG/2ML IJ SOLN: 25.0000 ug | INTRAMUSCULAR | Status: DC | PRN

## 2016-11-10 MED ORDER — DEXAMETHASONE SODIUM PHOSPHATE 10 MG/ML IJ SOLN
INTRAMUSCULAR | Status: AC
  Filled 2016-11-10: qty 1

## 2016-11-10 MED ORDER — ENOXAPARIN SODIUM 40 MG/0.4ML ~~LOC~~ SOLN
40.0000 mg | SUBCUTANEOUS | Status: DC
  Administered 2016-11-10 – 2016-11-11 (×2): 40 mg via SUBCUTANEOUS
  Filled 2016-11-10 (×2): qty 0.4

## 2016-11-10 MED ORDER — CHLORHEXIDINE GLUCONATE 0.12 % MT SOLN
15.0000 mL | Freq: Two times a day (BID) | OROMUCOSAL | Status: DC
  Administered 2016-11-10 (×2): 15 mL via OROMUCOSAL
  Filled 2016-11-10: qty 15

## 2016-11-10 MED ORDER — POTASSIUM CHLORIDE IN NACL 20-0.9 MEQ/L-% IV SOLN
INTRAVENOUS | Status: DC
  Administered 2016-11-10 – 2016-11-11 (×3): via INTRAVENOUS
  Filled 2016-11-10 (×3): qty 1000

## 2016-11-10 MED ORDER — PHENYLEPHRINE HCL 10 MG/ML IJ SOLN
INTRAMUSCULAR | Status: DC | PRN
  Administered 2016-11-10: 40 ug via INTRAVENOUS
  Administered 2016-11-10: 60 ug via INTRAVENOUS

## 2016-11-10 MED ORDER — MORPHINE SULFATE (PF) 4 MG/ML IV SOLN
4.0000 mg | INTRAVENOUS | Status: DC | PRN
  Administered 2016-11-11 (×2): 4 mg via INTRAVENOUS
  Filled 2016-11-10 (×3): qty 1

## 2016-11-10 MED ORDER — POTASSIUM CHLORIDE 10 MEQ/100ML IV SOLN
10.0000 meq | INTRAVENOUS | Status: AC
  Administered 2016-11-10: 10 meq via INTRAVENOUS
  Filled 2016-11-10 (×2): qty 100

## 2016-11-10 MED ORDER — OXYMETAZOLINE HCL 0.05 % NA SOLN
NASAL | Status: DC | PRN
  Administered 2016-11-10: 2 via NASAL

## 2016-11-10 MED ORDER — CEFAZOLIN SODIUM 1 G IJ SOLR
INTRAMUSCULAR | Status: AC
  Filled 2016-11-10: qty 20

## 2016-11-10 MED ORDER — ONDANSETRON HCL 4 MG/2ML IJ SOLN: 4.0000 mg | Freq: Four times a day (QID) | INTRAMUSCULAR | Status: DC | PRN

## 2016-11-10 MED ORDER — DEXAMETHASONE SODIUM PHOSPHATE 10 MG/ML IJ SOLN
INTRAMUSCULAR | Status: DC | PRN
  Administered 2016-11-10: 10 mg via INTRAVENOUS

## 2016-11-10 MED ORDER — ONDANSETRON 4 MG PO TBDP: 4.0000 mg | ORAL_TABLET | Freq: Four times a day (QID) | ORAL | Status: DC | PRN

## 2016-11-10 MED ORDER — CHLORHEXIDINE GLUCONATE 0.12 % MT SOLN
OROMUCOSAL | Status: AC
  Filled 2016-11-10: qty 15

## 2016-11-10 MED ORDER — FUROSEMIDE 10 MG/ML IJ SOLN
INTRAMUSCULAR | Status: DC | PRN
  Administered 2016-11-10: 10 mg via INTRAMUSCULAR

## 2016-11-10 MED ORDER — DEXTROSE 5 % IV SOLN
INTRAVENOUS | Status: DC | PRN
  Administered 2016-11-10: 25 ug/min via INTRAVENOUS

## 2016-11-10 MED ORDER — CEFAZOLIN SODIUM-DEXTROSE 1-4 GM/50ML-% IV SOLN
1.0000 g | Freq: Once | INTRAVENOUS | Status: AC
  Administered 2016-11-10: 1 g via INTRAVENOUS
  Filled 2016-11-10: qty 50

## 2016-11-10 SURGICAL SUPPLY — 71 items
BANDAGE COBAN STERILE 2 (GAUZE/BANDAGES/DRESSINGS) IMPLANT
BIT DRILL 2.2 SS TIBIAL (BIT) ×2 IMPLANT
BLADE SURG 15 STRL LF DISP TIS (BLADE) ×2 IMPLANT
BLADE SURG 15 STRL SS (BLADE) ×4
BNDG CMPR 9X4 STRL LF SNTH (GAUZE/BANDAGES/DRESSINGS) ×2
BNDG COHESIVE 4X5 TAN STRL (GAUZE/BANDAGES/DRESSINGS) ×4 IMPLANT
BNDG ESMARK 4X9 LF (GAUZE/BANDAGES/DRESSINGS) ×4 IMPLANT
BNDG GAUZE ELAST 4 BULKY (GAUZE/BANDAGES/DRESSINGS) ×8 IMPLANT
BRUSH SCRUB EZ PLAIN DRY (MISCELLANEOUS) ×3 IMPLANT
CANISTER SUCT 3000ML PPV (MISCELLANEOUS) ×4 IMPLANT
CHLORAPREP W/TINT 26ML (MISCELLANEOUS) ×4 IMPLANT
CORDS BIPOLAR (ELECTRODE) ×4 IMPLANT
COVER BACK TABLE 60X90IN (DRAPES) ×4 IMPLANT
COVER SURGICAL LIGHT HANDLE (MISCELLANEOUS) ×4 IMPLANT
CUFF TOURNIQUET SINGLE 24IN (TOURNIQUET CUFF) ×2 IMPLANT
DRAPE C-ARM 42X72 X-RAY (DRAPES) ×4 IMPLANT
DRAPE SURG 17X23 STRL (DRAPES) ×4 IMPLANT
DRSG ADAPTIC 3X8 NADH LF (GAUZE/BANDAGES/DRESSINGS) ×4 IMPLANT
DRSG EMULSION OIL 3X3 NADH (GAUZE/BANDAGES/DRESSINGS) IMPLANT
GAUZE SPONGE 4X4 12PLY STRL (GAUZE/BANDAGES/DRESSINGS) ×8 IMPLANT
GAUZE SPONGE 4X4 12PLY STRL LF (GAUZE/BANDAGES/DRESSINGS) ×2 IMPLANT
GLOVE BIO SURGEON STRL SZ7.5 (GLOVE) ×4 IMPLANT
GLOVE BIOGEL PI IND STRL 7.0 (GLOVE) ×2 IMPLANT
GLOVE BIOGEL PI IND STRL 8 (GLOVE) ×2 IMPLANT
GLOVE BIOGEL PI INDICATOR 7.0 (GLOVE) ×4
GLOVE BIOGEL PI INDICATOR 8 (GLOVE) ×2
GLOVE SURG SS PI 7.0 STRL IVOR (GLOVE) ×6 IMPLANT
GOWN STRL REUS W/ TWL XL LVL3 (GOWN DISPOSABLE) ×4 IMPLANT
GOWN STRL REUS W/TWL XL LVL3 (GOWN DISPOSABLE) ×12
K-WIRE 1.6 (WIRE) ×4
K-WIRE FX5X1.6XNS BN SS (WIRE) ×2
KIT BASIN OR (CUSTOM PROCEDURE TRAY) ×4 IMPLANT
KWIRE FX5X1.6XNS BN SS (WIRE) ×1 IMPLANT
NDL HYPO 25X1 1.5 SAFETY (NEEDLE) IMPLANT
NEEDLE HYPO 25X1 1.5 SAFETY (NEEDLE) ×4 IMPLANT
NS IRRIG 1000ML POUR BTL (IV SOLUTION) ×4 IMPLANT
PACK ORTHO EXTREMITY (CUSTOM PROCEDURE TRAY) ×4 IMPLANT
PAD CAST 4YDX4 CTTN HI CHSV (CAST SUPPLIES) ×2 IMPLANT
PADDING CAST ABS 4INX4YD NS (CAST SUPPLIES)
PADDING CAST ABS COTTON 4X4 ST (CAST SUPPLIES) IMPLANT
PADDING CAST COTTON 4X4 STRL (CAST SUPPLIES) ×4
PEG LOCKING SMOOTH 2.2X22 (Screw) ×3 IMPLANT
PEG LOCKING SMOOTH 2.2X24 (Peg) ×4 IMPLANT
PEG LOCKING SMOOTH 2.2X26 (Peg) ×6 IMPLANT
PENCIL BUTTON HOLSTER BLD 10FT (ELECTRODE) ×3 IMPLANT
PLATE MNDBLE STRAIGHT 4 H LONG (Plate) ×3 IMPLANT
PLATE STD DVR LEFT (Plate) ×4 IMPLANT
PLATE STD DVR LT 24X55 (Plate) ×1 IMPLANT
RUBBERBAND STERILE (MISCELLANEOUS) IMPLANT
SCREW BONE CROSS PIN 2.0X10MM (Screw) ×8 IMPLANT
SCREW LOCK 16X2.7X 3 LD TPR (Screw) IMPLANT
SCREW LOCK 18X2.7X 3 LD TPR (Screw) IMPLANT
SCREW LOCK 20X2.7X 3 LD TPR (Screw) IMPLANT
SCREW LOCK 26X2.7X 3 LD TPR (Screw) ×1 IMPLANT
SCREW LOCKING 2.7X16 (Screw) ×12 IMPLANT
SCREW LOCKING 2.7X18 (Screw) ×4 IMPLANT
SCREW LOCKING 2.7X20MM (Screw) ×4 IMPLANT
SCREW LOCKING 2.7X26MM (Screw) ×4 IMPLANT
SCREW UPPER FACE 2.0X8MM (Screw) ×12 IMPLANT
SPLINT FIBERGLASS 3X35 (CAST SUPPLIES) ×2 IMPLANT
SUT MNCRL AB 4-0 PS2 18 (SUTURE) ×2 IMPLANT
SUT PROLENE 4 0 PS 2 18 (SUTURE) ×2 IMPLANT
SUT PROLENE 5 0 PS 2 (SUTURE) ×4 IMPLANT
SUT VIC AB 2-0 CT3 27 (SUTURE) ×4 IMPLANT
SUT VIC AB 4-0 PS2 18 (SUTURE) ×3 IMPLANT
SUT VICRYL 4-0 PS2 18IN ABS (SUTURE) ×3 IMPLANT
SYR 10ML LL (SYRINGE) IMPLANT
TOWEL OR 17X24 6PK STRL BLUE (TOWEL DISPOSABLE) ×4 IMPLANT
TUBE CONNECTING 12'X1/4 (SUCTIONS) ×1
TUBE CONNECTING 12X1/4 (SUCTIONS) ×3 IMPLANT
UNDERPAD 30X30 (UNDERPADS AND DIAPERS) ×4 IMPLANT

## 2016-11-10 NOTE — Progress Notes (Signed)
Asked by nurse to eval bleeding coming from ear that was reported from PACU RN. After removing all guaze area appears hemostatic at this time. Con't ot reinforce wound.

## 2016-11-10 NOTE — Progress Notes (Signed)
Pt resting comfortably on nasal cannula with no respiratory distress noted. BIPAP not needed at this time. RT will monitor as needed.
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
## 2016-11-10 NOTE — Anesthesia Procedure Notes (Signed)
Procedure Name: Intubation Date/Time: 11/10/2016 3:45 AM Performed by: Edmonia Caprio Pre-anesthesia Checklist: Patient identified, Emergency Drugs available, Suction available, Patient being monitored and Timeout performed Patient Re-evaluated:Patient Re-evaluated prior to induction Oxygen Delivery Method: Circle system utilized Preoxygenation: Pre-oxygenation with 100% oxygen Induction Type: IV induction and Rapid sequence Laryngoscope Size: Glidescope and 3 Grade View: Grade I Tube type: Oral Nasal Tubes: Nasal Rae, Nasal prep performed and Right Tube size: 7.5 mm Number of attempts: 2 Airway Equipment and Method: Video-laryngoscopy Placement Confirmation: ETT inserted through vocal cords under direct vision,  positive ETCO2 and breath sounds checked- equal and bilateral Tube secured with: Tape Dental Injury: Teeth and Oropharynx as per pre-operative assessment and Bloody posterior oropharynx  Comments: DL x1 with Mil2 only epiglottis seen.  Immediately followed by DL with glidescope, ETT passed easily through cords. Old  blood in posterior pharynx on DL.
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
## 2016-11-10 NOTE — Progress Notes (Signed)
Pt weaned off Bipap to nasal cannula 4 L with humidity, tolerating well at this time.  Pt and family instructed on the use of incentive spirometer.  Pt able to reach 625 mL using incentive.

## 2016-11-10 NOTE — ED Notes (Signed)
Report given to [facility], CRNA
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
## 2016-11-10 NOTE — ED Notes (Signed)
Pt enroute to OR no addl blood draw.
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
## 2016-11-10 NOTE — Transfer of Care (Signed)
Immediate Anesthesia Transfer of Care Note  Patient: Jared Mccoy  Procedure(s) Performed: Procedure(s): OPEN REDUCTION INTERNAL FIXATION (ORIF) DISTAL RADIAL FRACTURE (Left) OPEN REDUCTION INTERNAL FIXATION (ORIF) MANDIBULAR FRACTURE, Ear Laceration Repair (Right)  Patient Location: PACU  Anesthesia Type:General  Level of Consciousness: awake and oriented  Airway & Oxygen Therapy: Patient Spontanous Breathing and Patient connected to face mask  Post-op Assessment: Report given to RN and Post -op Vital signs reviewed and stable  Post vital signs: Reviewed and stable  Last Vitals:  Vitals:   11/10/16 0700 11/10/16 0702  BP:  (!) 148/79  Pulse:  (!) 116  Resp:  (!) 31  Temp: 36.7 C   SpO2:  (!) 82%    Last Pain:  Vitals:   11/09/16 2315  TempSrc:   PainSc: 10-Worst pain ever         Complications: No apparent anesthesia complications
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
## 2016-11-11 ENCOUNTER — Encounter (HOSPITAL_COMMUNITY): Payer: Self-pay | Admitting: Orthopedic Surgery

## 2016-11-11 LAB — HIV ANTIBODY (ROUTINE TESTING W REFLEX): HIV Screen 4th Generation wRfx: NONREACTIVE

## 2016-11-11 LAB — SAMPLE TO BLOOD BANK

## 2016-11-11 MED ORDER — ORAL CARE MOUTH RINSE
15.0000 mL | Freq: Two times a day (BID) | OROMUCOSAL | Status: DC
  Administered 2016-11-11 (×2): 15 mL via OROMUCOSAL

## 2016-11-11 MED ORDER — HYDROCODONE-ACETAMINOPHEN 7.5-325 MG/15ML PO SOLN
15.0000 mL | ORAL | Status: DC | PRN
  Administered 2016-11-11 – 2016-11-12 (×3): 15 mL via ORAL
  Filled 2016-11-11 (×3): qty 15

## 2016-11-11 NOTE — Anesthesia Postprocedure Evaluation (Signed)
Anesthesia Post Note  Patient: DASAN HARDMAN  Procedure(s) Performed: OPEN REDUCTION INTERNAL FIXATION (ORIF) DISTAL RADIAL FRACTURE (Left ) OPEN REDUCTION INTERNAL FIXATION (ORIF) MANDIBULAR FRACTURE, Ear Laceration Repair (Right )     Patient location during evaluation: PACU Anesthesia Type: General Level of consciousness: awake and alert Pain management: pain level controlled Vital Signs Assessment: post-procedure vital signs reviewed and stable Respiratory status: spontaneous breathing, nonlabored ventilation and respiratory function stable Cardiovascular status: blood pressure returned to baseline and stable Postop Assessment: no apparent nausea or vomiting Anesthetic complications: no    Last Vitals:  Vitals:   11/11/16 0600 11/11/16 0700  BP: (!) 164/74 (!) 142/91  Pulse: 98 93  Resp: (!) 21 18  Temp:    SpO2: 95% 95%    Last Pain:  Vitals:   11/11/16 0400  TempSrc: Axillary  PainSc: Asleep                 Cecile Hearing

## 2016-11-11 NOTE — Progress Notes (Signed)
Subjective: No issues overnight.  Objective: Vital signs in last 24 hours: Temp:  [98.9 F (37.2 C)-100.9 F (38.3 C)] 99 F (37.2 C) (10/01 0800) Pulse Rate:  [86-113] 98 (10/01 0900) Resp:  [13-25] 13 (10/01 0900) BP: (142-183)/(67-102) 149/81 (10/01 0900) SpO2:  [92 %-99 %] 96 % (10/01 0900) FiO2 (%):  [50 %] 50 % (09/30 1100)  Right ear lacerations - repair intact. C/d/i. MMF screws and wires in place. Good occlusion.   Recent Labs  11/09/16 2328 11/10/16 0006 11/10/16 1047  WBC 10.2  --  11.4*  HGB 12.8* 13.9 12.3*  HCT 40.3 41.0 38.7*  PLT 202  --  204    Recent Labs  11/09/16 2328 11/10/16 0006 11/10/16 1047  NA 133* 142 138  K 2.5* 2.6* 4.6  CL 98* 99* 102  CO2 24  --  27  GLUCOSE 128* 124* 126*  BUN CREATININE 1.09 1.00 1.04  CALCIUM 7.8*  --  7.6*    Medications:  I have reviewed the patient's current medications. Scheduled: . enoxaparin (LOVENOX) injection  40 mg Subcutaneous Q24H  . mouth rinse  15 mL Mouth Rinse BID   Continuous: . sodium chloride 50 mL/hr at 11/11/16 0952  . clindamycin (CLEOCIN) IV Stopped (11/11/16 0606)   RUE:[id number], HYDROcodone-acetaminophen, HYDROmorphone (DILAUDID) injection, morphine injection, morphine injection, morphine injection, ondansetron **OR** ondansetron (ZOFRAN) IV  Assessment/Plan: POD #1 s/p ORIF and MMF of mandible fractures and repair of right ear lacerations.  - Peridex 15 ml swish and spit for 6 more days  - clindamycin  QID for 6 more days.  - Pt may follow up in my office in 1-2 weeks. - Will remove MMF screws and wires in 6 weeks.  - Liquid diet for 6 weeks.   LOS: 1 day   Shaquill Iseman W Lonn Im 11/11/2016, 10:15 AM
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
## 2016-11-12 MED ORDER — CLINDAMYCIN HCL 300 MG PO CAPS: 300.0000 mg | ORAL_CAPSULE | Freq: Three times a day (TID) | ORAL | 0 refills | Status: AC

## 2016-11-12 MED ORDER — HYDROCODONE-ACETAMINOPHEN 7.5-325 MG/15ML PO SOLN: 15.0000 mL | ORAL | 0 refills | Status: DC | PRN

## 2016-11-12 MED ORDER — CHLORHEXIDINE GLUCONATE 0.12 % MT SOLN: 15.0000 mL | Freq: Two times a day (BID) | OROMUCOSAL | 2 refills | Status: DC
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
## 2016-11-13 ENCOUNTER — Emergency Department (HOSPITAL_COMMUNITY)
Admission: EM | Admit: 2016-11-13 | Discharge: 2016-11-14 | Disposition: A | Discharge status: Home / Self Care | Payer: No Typology Code available for payment source | Attending: Emergency Medicine | Admitting: Emergency Medicine

## 2016-11-13 ENCOUNTER — Encounter (HOSPITAL_COMMUNITY): Payer: Self-pay | Admitting: *Deleted

## 2016-11-13 ENCOUNTER — Emergency Department (HOSPITAL_COMMUNITY): Payer: No Typology Code available for payment source

## 2016-11-13 DIAGNOSIS — Z79899 Other long term (current) drug therapy: Secondary | ICD-10-CM | POA: Insufficient documentation

## 2016-11-13 DIAGNOSIS — M79605 Pain in left leg: Secondary | ICD-10-CM | POA: Diagnosis present

## 2016-11-13 DIAGNOSIS — I10 Essential (primary) hypertension: Secondary | ICD-10-CM | POA: Insufficient documentation

## 2016-11-13 DIAGNOSIS — F17210 Nicotine dependence, cigarettes, uncomplicated: Secondary | ICD-10-CM | POA: Insufficient documentation

## 2016-11-13 DIAGNOSIS — R9389 Abnormal findings on diagnostic imaging of other specified body structures: Secondary | ICD-10-CM | POA: Insufficient documentation

## 2016-11-13 MED ORDER — HYDROCODONE-ACETAMINOPHEN 7.5-325 MG/15ML PO SOLN
15.0000 mL | Freq: Once | ORAL | Status: AC
  Administered 2016-11-13: 15 mL via ORAL
  Filled 2016-11-13: qty 15

## 2016-11-13 MED ORDER — KETOROLAC TROMETHAMINE 30 MG/ML IJ SOLN
60.0000 mg | Freq: Once | INTRAMUSCULAR | Status: AC
  Administered 2016-11-13: 60 mg via INTRAMUSCULAR
  Filled 2016-11-13: qty 2
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
## 2016-11-13 NOTE — ED Notes (Signed)
Hydrocolloid dressings applied to L calf wound and L posterior thigh wound.
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
## 2016-11-14 MED ORDER — AZITHROMYCIN 250 MG PO TABS: 250.0000 mg | ORAL_TABLET | Freq: Every day | ORAL | 0 refills | Status: DC

## 2016-11-14 MED ORDER — AZITHROMYCIN 200 MG/5ML PO SUSR: 250.0000 mg | Freq: Every day | ORAL | 0 refills | Status: AC

## 2016-11-14 MED ORDER — AZITHROMYCIN 250 MG PO TABS: 500.0000 mg | ORAL_TABLET | Freq: Once | ORAL | Status: DC

## 2016-11-14 MED ORDER — CLINDAMYCIN PALMITATE HCL 75 MG/5ML PO SOLR: 300.0000 mg | Freq: Three times a day (TID) | ORAL | 0 refills | Status: AC

## 2016-11-14 MED ORDER — AZITHROMYCIN 200 MG/5ML PO SUSR
500.0000 mg | Freq: Once | ORAL | Status: AC
  Administered 2016-11-14: 500 mg via ORAL
  Filled 2016-11-14: qty 12.5

## 2016-11-14 NOTE — Discharge Instructions (Signed)
Please follow up with your doctor for evaluation. Please consider taking ibuprofen in addition to your pain medication.  You may take up to  of ibuprofen every 8 hours.    Today you received medications that may make you sleepy or impair your ability to make decisions.  For the next 24 hours please do not drive, operate heavy machinery, care for a small child with out another adult present, or perform any activities that may cause harm to you or someone else if you were to fall asleep or be impaired.   You are being prescribed a medication which may make you sleepy. Please follow up of listed precautions for at least 24 hours after taking one dose.  Per your request you have been given clindamycin in liquid form.  Please do not take the pills in addition to the liquid.

## 2016-11-14 NOTE — ED Provider Notes (Signed)
ED ECG REPORT   Date: 11/14/2016 1308  Rate: 89  Rhythm: normal sinus rhythm  QRS Axis: normal  Intervals: normal  ST/T Wave abnormalities: nonspecific ST changes  Conduction Disutrbances:none    I have personally reviewed the EKG tracing and agree with the computerized printout as noted.    Zadie Rhine, MD 11/14/16 [phone]
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
## 2016-11-17 ENCOUNTER — Emergency Department (HOSPITAL_COMMUNITY)
Admission: EM | Admit: 2016-11-17 | Discharge: 2016-11-17 | Disposition: A | Discharge status: Home / Self Care | Payer: No Typology Code available for payment source | Attending: Emergency Medicine | Admitting: Emergency Medicine

## 2016-11-17 ENCOUNTER — Emergency Department (HOSPITAL_COMMUNITY): Payer: No Typology Code available for payment source

## 2016-11-17 ENCOUNTER — Encounter (HOSPITAL_COMMUNITY): Payer: Self-pay | Admitting: *Deleted

## 2016-11-17 DIAGNOSIS — I10 Essential (primary) hypertension: Secondary | ICD-10-CM | POA: Diagnosis not present

## 2016-11-17 DIAGNOSIS — Y92410 Unspecified street and highway as the place of occurrence of the external cause: Secondary | ICD-10-CM | POA: Insufficient documentation

## 2016-11-17 DIAGNOSIS — Y939 Activity, unspecified: Secondary | ICD-10-CM | POA: Diagnosis not present

## 2016-11-17 DIAGNOSIS — S82832A Other fracture of upper and lower end of left fibula, initial encounter for closed fracture: Secondary | ICD-10-CM | POA: Diagnosis not present

## 2016-11-17 DIAGNOSIS — F17210 Nicotine dependence, cigarettes, uncomplicated: Secondary | ICD-10-CM | POA: Insufficient documentation

## 2016-11-17 DIAGNOSIS — Y999 Unspecified external cause status: Secondary | ICD-10-CM | POA: Diagnosis not present

## 2016-11-17 DIAGNOSIS — M79662 Pain in left lower leg: Secondary | ICD-10-CM | POA: Diagnosis present

## 2016-11-17 DIAGNOSIS — Z79899 Other long term (current) drug therapy: Secondary | ICD-10-CM | POA: Insufficient documentation

## 2016-11-17 MED ORDER — HYDROCODONE-ACETAMINOPHEN 7.5-325 MG/15ML PO SOLN
15.0000 mL | Freq: Once | ORAL | Status: AC
  Administered 2016-11-17: 15 mL via ORAL
  Filled 2016-11-17: qty 15

## 2016-11-17 NOTE — ED Notes (Signed)
Ortho tech returned page.  States she is only International aid/development worker between [employer] and [facility] this morning and will be on campus as soon as possible.  30-45 minutes.
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
## 2016-11-17 NOTE — ED Notes (Signed)
Page returned from ortho tech

## 2016-11-17 NOTE — Progress Notes (Signed)
Orthopedic Tech Progress Note Patient Details:  Jared Mccoy March 08, 1990 [id number]  Ortho Devices Type of Ortho Device: Knee Immobilizer Ortho Device/Splint Interventions: Application   Saul Fordyce 11/17/2016, 11:12 AM

## 2016-11-17 NOTE — ED Notes (Signed)
Ortho tech returned page.  Tech en route to apply immobilizer.

## 2016-11-17 NOTE — ED Notes (Signed)
Ortho tech paged for knee immobilizer 

## 2016-11-17 NOTE — ED Notes (Signed)
Ortho tech repaged for knee immobilizer
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
## 2016-11-27 ENCOUNTER — Other Ambulatory Visit: Payer: Self-pay | Admitting: Otolaryngology

## 2016-12-02 ENCOUNTER — Ambulatory Visit: Payer: Self-pay | Attending: Orthopedic Surgery | Admitting: Occupational Therapy

## 2016-12-02 DIAGNOSIS — M6281 Muscle weakness (generalized): Secondary | ICD-10-CM | POA: Insufficient documentation

## 2016-12-02 DIAGNOSIS — M25532 Pain in left wrist: Secondary | ICD-10-CM | POA: Insufficient documentation

## 2016-12-02 DIAGNOSIS — M25632 Stiffness of left wrist, not elsewhere classified: Secondary | ICD-10-CM | POA: Insufficient documentation

## 2016-12-04 ENCOUNTER — Ambulatory Visit: Payer: Self-pay | Admitting: Occupational Therapy

## 2016-12-04 DIAGNOSIS — M6281 Muscle weakness (generalized): Secondary | ICD-10-CM

## 2016-12-04 DIAGNOSIS — M25532 Pain in left wrist: Secondary | ICD-10-CM

## 2016-12-04 DIAGNOSIS — M25632 Stiffness of left wrist, not elsewhere classified: Secondary | ICD-10-CM
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
## 2016-12-04 NOTE — Patient Instructions (Signed)
Flexor Tendon Gliding (Active Hook Fist)   With fingers and knuckles straight, bend middle and tip joints. Do not bend large knuckles. Repeat _10-15___ times. Do _4-6___ sessions per day.  MP Flexion (Active)   With back of hand on table, bend large knuckles as far as they will go, keeping small joints straight. Repeat _10-15___ times. Do __4-6__ sessions per day. Activity: Reach into a narrow container.*      Finger Flexion / Extension   With palm up, bend fingers of left hand toward palm, making a  fist. Straighten fingers, opening fist. Repeat sequence _10-15___ times per session. Do _4-6__ sessions per day. Hand Variation: Palm down   Copyright  VHI. All rights reserved.  AROM: Wrist Extension   .  With _left___ palm down, bend wrist up. Repeat __15__ times per set.  Do __4-6__ sessions per day.         AROM: Forearm Pronation / Supination   With ___left_ arm in handshake position, slowly rotate palm down until stretch is felt. Relax. Then rotate palm up until stretch is felt. Repeat _15___ times per set. Do _4-6___ sessions per day.  Copyright  VHI. All rights reserved.    Wash arm daily with soap and water, dry completely before putting splint back on Call your doctor if any signs or symptoms of infection

## 2016-12-05 ENCOUNTER — Ambulatory Visit: Payer: Self-pay | Admitting: Occupational Therapy

## 2016-12-05 DIAGNOSIS — M25632 Stiffness of left wrist, not elsewhere classified: Secondary | ICD-10-CM

## 2016-12-05 DIAGNOSIS — M6281 Muscle weakness (generalized): Secondary | ICD-10-CM

## 2016-12-05 DIAGNOSIS — M25532 Pain in left wrist: Secondary | ICD-10-CM
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
## 2016-12-05 NOTE — Patient Instructions (Addendum)
Flexor Tendon Gliding (Active Hook Fist)   With fingers and knuckles straight, bend middle and tip joints. Do not bend large knuckles. Repeat _10-15___ times. Do _4-6___ sessions per day.  MP Flexion (Active)   With back of hand on table, bend large knuckles as far as they will go, keeping small joints straight. Repeat _10-15___ times. Do __4-6__ sessions per day. Activity: Reach into a narrow container.*      Finger Flexion / Extension   With palm up, bend fingers of left hand toward palm, making a  fist. Straighten fingers, opening fist. Repeat sequence _10-15___ times per session. Do _4-6__ sessions per day. Hand Variation: Palm down   Copyright  VHI. All rights reserved.  AROM: Wrist Extension   .  With _left___ palm down, bend wrist up. Repeat __15__ times per set.  Do __4-6__ sessions per day.         AROM: Forearm Pronation / Supination   With ___left_ arm in handshake position, slowly rotate palm down until stretch is felt. Relax. Then rotate palm up until stretch is felt. Repeat _15___ times per set. Do _4-6___ sessions per day.      Wash arm daily with soap and water, dry completely before putting splint back on Call your doctor if any signs or symptoms of infection      **After performing each exercise without help 10-15 times.  Then repeat each exercise using 2 fingers of left hand to gently guide right wrist/finger movement 10 times.  Do not push hard with your left hand.

## 2016-12-10 ENCOUNTER — Encounter: Payer: Self-pay | Admitting: Occupational Therapy

## 2016-12-11 ENCOUNTER — Ambulatory Visit: Payer: Self-pay | Admitting: Occupational Therapy

## 2016-12-16 ENCOUNTER — Encounter (HOSPITAL_BASED_OUTPATIENT_CLINIC_OR_DEPARTMENT_OTHER): Payer: Self-pay | Admitting: *Deleted

## 2016-12-18 ENCOUNTER — Ambulatory Visit: Payer: Self-pay | Attending: Orthopedic Surgery | Admitting: Occupational Therapy

## 2016-12-18 ENCOUNTER — Encounter: Payer: Self-pay | Admitting: Occupational Therapy

## 2016-12-18 DIAGNOSIS — M25632 Stiffness of left wrist, not elsewhere classified: Secondary | ICD-10-CM | POA: Insufficient documentation

## 2016-12-18 DIAGNOSIS — M25532 Pain in left wrist: Secondary | ICD-10-CM | POA: Insufficient documentation

## 2016-12-18 DIAGNOSIS — M6281 Muscle weakness (generalized): Secondary | ICD-10-CM | POA: Insufficient documentation
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
## 2016-12-18 NOTE — Patient Instructions (Signed)
PROM: Wrist Flexion / Extension ° ° °Grasp  hand and slowly bend wrist until stretch is felt. Relax. Then stretch as far as possible in opposite direction. Be sure to keep elbow bent.  Hold __10__ sec. each way °Repeat _5___ times per set.    Do _4-6___ sessions per day. ° °Pronation (Passive) ° ° °Keep elbow bent at right angle and held firmly to side. Use other hand to turn forearm until palm faces downward. Hold _10___ seconds. °Repeat __5__ times. Do _4-6___ sessions per day. ° °Supination (Passive) ° ° °Keep elbow bent at right angle and held firmly at side. Use other hand to turn forearm until palm faces upward. °Hold __10__ seconds. °Repeat __5__ times. Do _4-6___ sessions per day. ° °Copyright © [employer]. All rights reserved.   °

## 2016-12-23 ENCOUNTER — Encounter: Payer: Self-pay | Admitting: Occupational Therapy

## 2016-12-25 ENCOUNTER — Encounter: Payer: Self-pay | Admitting: Occupational Therapy

## 2016-12-26 ENCOUNTER — Encounter (HOSPITAL_COMMUNITY): Payer: Self-pay | Admitting: *Deleted

## 2016-12-26 ENCOUNTER — Other Ambulatory Visit: Payer: Self-pay

## 2016-12-26 ENCOUNTER — Other Ambulatory Visit: Payer: Self-pay | Admitting: Otolaryngology

## 2016-12-27 ENCOUNTER — Ambulatory Visit (HOSPITAL_COMMUNITY)
Admission: RE | Admit: 2016-12-27 | Discharge: 2016-12-27 | Disposition: A | Discharge status: Home / Self Care | Payer: Self-pay | Source: Ambulatory Visit | Attending: Otolaryngology | Admitting: Otolaryngology

## 2016-12-27 ENCOUNTER — Ambulatory Visit (HOSPITAL_COMMUNITY): Payer: Self-pay | Admitting: Certified Registered"

## 2016-12-27 ENCOUNTER — Encounter (HOSPITAL_COMMUNITY)
Admission: RE | Disposition: A | Discharge status: Home / Self Care | Payer: Self-pay | Source: Ambulatory Visit | Attending: Otolaryngology

## 2016-12-27 ENCOUNTER — Encounter (HOSPITAL_COMMUNITY): Payer: Self-pay | Admitting: *Deleted

## 2016-12-27 ENCOUNTER — Ambulatory Visit: Payer: Self-pay | Admitting: Occupational Therapy

## 2016-12-27 DIAGNOSIS — F17210 Nicotine dependence, cigarettes, uncomplicated: Secondary | ICD-10-CM | POA: Insufficient documentation

## 2016-12-27 DIAGNOSIS — I739 Peripheral vascular disease, unspecified: Secondary | ICD-10-CM | POA: Insufficient documentation

## 2016-12-27 DIAGNOSIS — I10 Essential (primary) hypertension: Secondary | ICD-10-CM | POA: Insufficient documentation

## 2016-12-27 DIAGNOSIS — Z6841 Body Mass Index (BMI) 40.0 and over, adult: Secondary | ICD-10-CM | POA: Insufficient documentation

## 2016-12-27 DIAGNOSIS — E669 Obesity, unspecified: Secondary | ICD-10-CM | POA: Insufficient documentation

## 2016-12-27 DIAGNOSIS — Z4589 Encounter for adjustment and management of other implanted devices: Secondary | ICD-10-CM | POA: Insufficient documentation

## 2016-12-27 DIAGNOSIS — Z79899 Other long term (current) drug therapy: Secondary | ICD-10-CM | POA: Insufficient documentation

## 2016-12-27 HISTORY — PX: MANDIBULAR HARDWARE REMOVAL: SHX5205

## 2016-12-27 HISTORY — DX: Peripheral vascular disease, unspecified: I73.9

## 2016-12-27 LAB — BASIC METABOLIC PANEL
Anion gap: 7 (ref 5–15)
BUN: 6 mg/dL (ref 6–20)
CALCIUM: 8.8 mg/dL — AB (ref 8.9–10.3)
CO2: 26 mmol/L (ref 22–32)
CREATININE: 0.87 mg/dL (ref 0.61–1.24)
Chloride: 106 mmol/L (ref 101–111)
GFR calc Af Amer: >60 mL/min (ref >60)
GLUCOSE: 88 mg/dL (ref 65–99)
POTASSIUM: 3.5 mmol/L (ref 3.5–5.1)
SODIUM: 139 mmol/L (ref 135–145)

## 2016-12-27 LAB — CBC
HEMATOCRIT: 39.5 % (ref 39.0–52.0)
Hemoglobin: 12.5 g/dL — ABNORMAL LOW (ref 13.0–17.0)
MCH: 25.6 pg — ABNORMAL LOW (ref 26.0–34.0)
MCHC: 31.6 g/dL (ref 30.0–36.0)
MCV: 80.8 fL (ref 78.0–100.0)
PLATELETS: 247 10*3/uL (ref 150–400)
RBC: 4.89 MIL/uL (ref 4.22–5.81)
RDW: 16.1 % — AB (ref 11.5–15.5)
WBC: 5.1 10*3/uL (ref 4.0–10.5)

## 2016-12-27 SURGERY — REMOVAL, HARDWARE, MANDIBLE
Anesthesia: Monitor Anesthesia Care

## 2016-12-27 MED ORDER — ONDANSETRON HCL 4 MG/2ML IJ SOLN
INTRAMUSCULAR | Status: AC
  Filled 2016-12-27: qty 2

## 2016-12-27 MED ORDER — FENTANYL CITRATE (PF) 100 MCG/2ML IJ SOLN: 25.0000 ug | INTRAMUSCULAR | Status: DC | PRN

## 2016-12-27 MED ORDER — FENTANYL CITRATE (PF) 250 MCG/5ML IJ SOLN
INTRAMUSCULAR | Status: AC
Start: 2016-12-27 — End: 2016-12-27
  Filled 2016-12-27: qty 5

## 2016-12-27 MED ORDER — LIDOCAINE 2% (20 MG/ML) 5 ML SYRINGE
INTRAMUSCULAR | Status: AC
  Filled 2016-12-27: qty 5

## 2016-12-27 MED ORDER — DEXAMETHASONE SODIUM PHOSPHATE 10 MG/ML IJ SOLN
INTRAMUSCULAR | Status: AC
  Filled 2016-12-27: qty 1

## 2016-12-27 MED ORDER — SUCCINYLCHOLINE CHLORIDE 200 MG/10ML IV SOSY
PREFILLED_SYRINGE | INTRAVENOUS | Status: AC
  Filled 2016-12-27: qty 10

## 2016-12-27 MED ORDER — DEXMEDETOMIDINE HCL 200 MCG/2ML IV SOLN
INTRAVENOUS | Status: DC | PRN
  Administered 2016-12-27 (×2): 4 ug via INTRAVENOUS
  Administered 2016-12-27: 8 ug via INTRAVENOUS

## 2016-12-27 MED ORDER — 0.9 % SODIUM CHLORIDE (POUR BTL) OPTIME
TOPICAL | Status: DC | PRN
  Administered 2016-12-27: 1000 mL

## 2016-12-27 MED ORDER — PROPOFOL 10 MG/ML IV BOLUS
INTRAVENOUS | Status: AC
  Filled 2016-12-27: qty 20

## 2016-12-27 MED ORDER — MIDAZOLAM HCL 5 MG/5ML IJ SOLN
INTRAMUSCULAR | Status: DC | PRN
  Administered 2016-12-27: 2 mg via INTRAVENOUS

## 2016-12-27 MED ORDER — MIDAZOLAM HCL 2 MG/2ML IJ SOLN
INTRAMUSCULAR | Status: AC
  Filled 2016-12-27: qty 2

## 2016-12-27 MED ORDER — FENTANYL CITRATE (PF) 100 MCG/2ML IJ SOLN
INTRAMUSCULAR | Status: DC | PRN
  Administered 2016-12-27 (×2): 50 ug via INTRAVENOUS

## 2016-12-27 MED ORDER — LACTATED RINGERS IV SOLN
INTRAVENOUS | Status: DC
Start: 2016-12-27 — End: 2016-12-27
  Administered 2016-12-27: 10:00:00 via INTRAVENOUS

## 2016-12-27 MED ORDER — LIDOCAINE-EPINEPHRINE 1 %-1:100000 IJ SOLN
INTRAMUSCULAR | Status: AC
  Filled 2016-12-27: qty 1

## 2016-12-27 MED ORDER — LIDOCAINE-EPINEPHRINE 1 %-1:100000 IJ SOLN
INTRAMUSCULAR | Status: DC | PRN
  Administered 2016-12-27: 5 mL

## 2016-12-27 SURGICAL SUPPLY — 29 items
BLADE 10 SAFETY STRL DISP (BLADE) ×1 IMPLANT
BLADE 15 SAFETY STRL DISP (BLADE) ×3 IMPLANT
BLADE SURG 15 STRL LF DISP TIS (BLADE) IMPLANT
BLADE SURG 15 STRL SS (BLADE)
CANISTER SUCT 3000ML PPV (MISCELLANEOUS) ×3 IMPLANT
COVER SURGICAL LIGHT HANDLE (MISCELLANEOUS) ×3 IMPLANT
DECANTER SPIKE VIAL GLASS SM (MISCELLANEOUS) ×3 IMPLANT
DRAPE HALF SHEET 40X57 (DRAPES) ×3 IMPLANT
GAUZE SPONGE 4X4 16PLY XRAY LF (GAUZE/BANDAGES/DRESSINGS) IMPLANT
GLOVE ECLIPSE 7.5 STRL STRAW (GLOVE) ×3 IMPLANT
GOWN STRL REUS W/ TWL LRG LVL3 (GOWN DISPOSABLE) ×1 IMPLANT
GOWN STRL REUS W/TWL LRG LVL3 (GOWN DISPOSABLE) ×3
KIT BASIN OR (CUSTOM PROCEDURE TRAY) ×3 IMPLANT
KIT ROOM TURNOVER OR (KITS) ×3 IMPLANT
NDL HYPO 25GX1X1/2 BEV (NEEDLE) IMPLANT
NEEDLE HYPO 25GX1X1/2 BEV (NEEDLE) IMPLANT
NEEDLE PRECISIONGLIDE 27X1.5 (NEEDLE) ×3 IMPLANT
NS IRRIG 1000ML POUR BTL (IV SOLUTION) ×3 IMPLANT
PAD ARMBOARD 7.5X6 YLW CONV (MISCELLANEOUS) ×6 IMPLANT
SUCTION FRAZIER HANDLE 10FR (MISCELLANEOUS)
SUCTION TUBE FRAZIER 10FR DISP (MISCELLANEOUS) IMPLANT
SUT CHROMIC 3 0 PS 2 (SUTURE) IMPLANT
SUT CHROMIC 4 0 PS 2 18 (SUTURE) IMPLANT
SYR CONTROL 10ML LL (SYRINGE) ×3 IMPLANT
TRAY ENT MC OR (CUSTOM PROCEDURE TRAY) ×3 IMPLANT
TUBE CONNECTING 12'X1/4 (SUCTIONS) ×1
TUBE CONNECTING 12X1/4 (SUCTIONS) ×2 IMPLANT
WATER STERILE IRR 1000ML POUR (IV SOLUTION) ×3 IMPLANT
YANKAUER SUCT BULB TIP NO VENT (SUCTIONS) ×3 IMPLANT
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
## 2016-12-27 NOTE — Anesthesia Preprocedure Evaluation (Signed)
Anesthesia Evaluation  Patient identified by MRN, date of birth, ID band  Airway Mallampati: II  TM Distance: >3 FB     Dental   Pulmonary Current Smoker,    breath sounds clear to auscultation       Cardiovascular hypertension, + Peripheral Vascular Disease   Rhythm:Regular Rate:Normal     Neuro/Psych    GI/Hepatic negative GI ROS, Neg liver ROS,   Endo/Other  negative endocrine ROS  Renal/GU negative Renal ROS     Musculoskeletal   Abdominal   Peds  Hematology   Anesthesia Other Findings   Reproductive/Obstetrics                             Anesthesia Physical Anesthesia Plan  ASA: III  Anesthesia Plan: MAC   Post-op Pain Management:    Induction: Intravenous  PONV Risk Score and Plan: 1 and Treatment may vary due to age or medical condition, Ondansetron, Dexamethasone and Propofol infusion  Airway Management Planned:   Additional Equipment:   Intra-op Plan:   Post-operative Plan:   Informed Consent: I have reviewed the patients History and Physical, chart, labs and discussed the procedure including the risks, benefits and alternatives for the proposed anesthesia with the patient or authorized representative who has indicated his/her understanding and acceptance.   Dental advisory given  Plan Discussed with: CRNA and Anesthesiologist  Anesthesia Plan Comments:         Anesthesia Quick Evaluation

## 2016-12-27 NOTE — Transfer of Care (Signed)
Immediate Anesthesia Transfer of Care Note  Patient: Jared Mccoy  Procedure(s) Performed: MANDIBULAR HARDWARE REMOVAL (N/A )  Patient Location: PACU  Anesthesia Type:MAC  Level of Consciousness: awake, alert  and patient cooperative  Airway & Oxygen Therapy: Patient Spontanous Breathing  Post-op Assessment: Report given to RN and Post -op Vital signs reviewed and stable  Post vital signs: Reviewed and stable  Last Vitals:  Vitals:   12/27/16 0933 12/27/16 1230  BP: (!) 175/93   Pulse: 74 61  Resp: 18 (!) 21  Temp: 37.3 C   SpO2: 95% 100%    Last Pain:  Vitals:   12/27/16 0937  TempSrc:   PainSc: 6       Patients Stated Pain Goal: 6 (33/61/22 4497)  Complications: No apparent anesthesia complications

## 2016-12-27 NOTE — Discharge Instructions (Addendum)
The patient may resume all his previous activities. He may advance his diet to regular. He may follow up as needed.

## 2016-12-27 NOTE — Anesthesia Procedure Notes (Signed)
Procedure Name: MAC Date/Time: 12/27/2016 12:06 PM Performed by: Lance Coon, CRNA Pre-anesthesia Checklist: Patient identified, Emergency Drugs available, Suction available, Patient being monitored and Timeout performed Patient Re-evaluated:Patient Re-evaluated prior to induction Oxygen Delivery Method: Nasal cannula

## 2016-12-27 NOTE — Anesthesia Postprocedure Evaluation (Signed)
Anesthesia Post Note  Patient: Jared Mccoy  Procedure(s) Performed: MANDIBULAR HARDWARE REMOVAL (N/A )     Patient location during evaluation: PACU Anesthesia Type: MAC Level of consciousness: awake Pain management: pain level controlled Vital Signs Assessment: post-procedure vital signs reviewed and stable Respiratory status: spontaneous breathing Cardiovascular status: stable Anesthetic complications: no    Last Vitals:  Vitals:   12/27/16 1247 12/27/16 1250  BP: (!) 149/89 (!) 153/100  Pulse: 61 79  Resp: 13 19  Temp: 36.7 C 36.7 C  SpO2: 98% 99%    Last Pain:  Vitals:   12/27/16 1247  TempSrc:   PainSc: 0-No pain                 Toia Micale

## 2016-12-30 ENCOUNTER — Encounter (HOSPITAL_COMMUNITY): Payer: Self-pay | Admitting: Otolaryngology

## 2016-12-30 ENCOUNTER — Encounter: Payer: Self-pay | Admitting: Occupational Therapy

## 2016-12-31 ENCOUNTER — Encounter: Payer: Self-pay | Admitting: Occupational Therapy

## 2017-01-06 ENCOUNTER — Encounter: Payer: Self-pay | Admitting: Occupational Therapy

## 2017-01-09 ENCOUNTER — Encounter: Payer: Self-pay | Admitting: Occupational Therapy

## 2017-01-10 ENCOUNTER — Ambulatory Visit: Payer: Self-pay | Admitting: Occupational Therapy

## 2017-01-13 ENCOUNTER — Encounter: Payer: Self-pay | Admitting: Occupational Therapy

## 2017-01-16 ENCOUNTER — Encounter: Payer: Self-pay | Admitting: Occupational Therapy

## 2017-01-17 ENCOUNTER — Ambulatory Visit: Payer: Self-pay | Attending: Orthopedic Surgery | Admitting: Occupational Therapy

## 2017-01-20 ENCOUNTER — Encounter: Payer: Self-pay | Admitting: Occupational Therapy

## 2017-01-23 ENCOUNTER — Encounter: Payer: Self-pay | Admitting: Occupational Therapy

## 2017-01-24 ENCOUNTER — Ambulatory Visit: Payer: Self-pay | Admitting: Occupational Therapy

## 2017-01-27 ENCOUNTER — Encounter: Payer: Self-pay | Admitting: Occupational Therapy

## 2017-01-30 ENCOUNTER — Encounter: Payer: Self-pay | Admitting: Occupational Therapy

## 2017-03-06 ENCOUNTER — Other Ambulatory Visit: Payer: Self-pay

## 2017-03-06 ENCOUNTER — Encounter (HOSPITAL_COMMUNITY): Payer: Self-pay | Admitting: Emergency Medicine

## 2017-03-06 ENCOUNTER — Emergency Department (HOSPITAL_COMMUNITY)
Admission: EM | Admit: 2017-03-06 | Discharge: 2017-03-06 | Disposition: A | Discharge status: Home / Self Care | Payer: Self-pay | Attending: Emergency Medicine | Admitting: Emergency Medicine

## 2017-03-06 DIAGNOSIS — Z202 Contact with and (suspected) exposure to infections with a predominantly sexual mode of transmission: Secondary | ICD-10-CM | POA: Insufficient documentation

## 2017-03-06 DIAGNOSIS — Z5321 Procedure and treatment not carried out due to patient leaving prior to being seen by health care provider: Secondary | ICD-10-CM | POA: Insufficient documentation

## 2017-03-06 LAB — URINALYSIS, ROUTINE W REFLEX MICROSCOPIC
BILIRUBIN URINE: NEGATIVE
Glucose, UA: NEGATIVE mg/dL
Hgb urine dipstick: NEGATIVE
Ketones, ur: NEGATIVE mg/dL
LEUKOCYTES UA: NEGATIVE
Nitrite: NEGATIVE
PH: 6 (ref 5.0–8.0)
Protein, ur: NEGATIVE mg/dL
SPECIFIC GRAVITY, URINE: 1.023 (ref 1.005–1.030)

## 2017-03-06 NOTE — ED Notes (Signed)
Unable to locate x 3

## 2017-03-06 NOTE — ED Triage Notes (Signed)
Pt requesting STD screen. Denies symptoms.

## 2019-08-12 ENCOUNTER — Emergency Department: Payer: Self-pay

## 2019-08-12 ENCOUNTER — Other Ambulatory Visit: Payer: Self-pay

## 2019-08-12 ENCOUNTER — Emergency Department
Admission: EM | Admit: 2019-08-12 | Discharge: 2019-08-13 | Disposition: A | Discharge status: Home / Self Care | Payer: Self-pay | Attending: Emergency Medicine | Admitting: Emergency Medicine

## 2019-08-12 DIAGNOSIS — Y929 Unspecified place or not applicable: Secondary | ICD-10-CM | POA: Insufficient documentation

## 2019-08-12 DIAGNOSIS — I159 Secondary hypertension, unspecified: Secondary | ICD-10-CM | POA: Insufficient documentation

## 2019-08-12 DIAGNOSIS — W260XXA Contact with knife, initial encounter: Secondary | ICD-10-CM | POA: Insufficient documentation

## 2019-08-12 DIAGNOSIS — S61432A Puncture wound without foreign body of left hand, initial encounter: Secondary | ICD-10-CM | POA: Insufficient documentation

## 2019-08-12 DIAGNOSIS — F17210 Nicotine dependence, cigarettes, uncomplicated: Secondary | ICD-10-CM | POA: Insufficient documentation

## 2019-08-12 DIAGNOSIS — Y9389 Activity, other specified: Secondary | ICD-10-CM | POA: Insufficient documentation

## 2019-08-12 DIAGNOSIS — Y999 Unspecified external cause status: Secondary | ICD-10-CM | POA: Insufficient documentation

## 2019-08-12 DIAGNOSIS — Z23 Encounter for immunization: Secondary | ICD-10-CM | POA: Insufficient documentation

## 2019-08-12 DIAGNOSIS — S6990XA Unspecified injury of unspecified wrist, hand and finger(s), initial encounter: Secondary | ICD-10-CM

## 2019-08-12 LAB — BASIC METABOLIC PANEL
Anion gap: 11 (ref 5–15)
BUN: 14 mg/dL (ref 6–20)
CO2: 27 mmol/L (ref 22–32)
Calcium: 8.4 mg/dL — ABNORMAL LOW (ref 8.9–10.3)
Chloride: 103 mmol/L (ref 98–111)
Creatinine, Ser: 1.2 mg/dL (ref 0.61–1.24)
GFR calc Af Amer: >60 mL/min (ref >60)
GFR calc non Af Amer: >60 mL/min (ref >60)
Glucose, Bld: 96 mg/dL (ref 70–99)
Potassium: 3 mmol/L — ABNORMAL LOW (ref 3.5–5.1)
Sodium: 141 mmol/L (ref 135–145)

## 2019-08-12 LAB — CBC WITH DIFFERENTIAL/PLATELET
Abs Immature Granulocytes: 0.02 10*3/uL (ref 0.00–0.07)
Basophils Absolute: 0 10*3/uL (ref 0.0–0.1)
Basophils Relative: 0 %
Eosinophils Absolute: 0.1 10*3/uL (ref 0.0–0.5)
Eosinophils Relative: 2 %
HCT: 38.8 % — ABNORMAL LOW (ref 39.0–52.0)
Hemoglobin: 12.5 g/dL — ABNORMAL LOW (ref 13.0–17.0)
Immature Granulocytes: 0 %
Lymphocytes Relative: 33 %
Lymphs Abs: 2.8 10*3/uL (ref 0.7–4.0)
MCH: 25.6 pg — ABNORMAL LOW (ref 26.0–34.0)
MCHC: 32.2 g/dL (ref 30.0–36.0)
MCV: 79.5 fL — ABNORMAL LOW (ref 80.0–100.0)
Monocytes Absolute: 0.6 10*3/uL (ref 0.1–1.0)
Monocytes Relative: 8 %
Neutro Abs: 4.8 10*3/uL (ref 1.7–7.7)
Neutrophils Relative %: 57 %
Platelets: 228 10*3/uL (ref 150–400)
RBC: 4.88 MIL/uL (ref 4.22–5.81)
RDW: 16.4 % — ABNORMAL HIGH (ref 11.5–15.5)
WBC: 8.4 10*3/uL (ref 4.0–10.5)
nRBC: 0 % (ref 0.0–0.2)

## 2019-08-12 LAB — TROPONIN I (HIGH SENSITIVITY): Troponin I (High Sensitivity): 16 ng/L (ref <18)

## 2019-08-12 MED ORDER — HYDROCHLOROTHIAZIDE 25 MG PO TABS
25.0000 mg | ORAL_TABLET | Freq: Once | ORAL | Status: AC
  Administered 2019-08-12: 25 mg via ORAL
  Filled 2019-08-12: qty 1

## 2019-08-12 MED ORDER — IBUPROFEN 800 MG PO TABS
800.0000 mg | ORAL_TABLET | Freq: Once | ORAL | Status: AC
  Administered 2019-08-12: 800 mg via ORAL
  Filled 2019-08-12: qty 1

## 2019-08-12 MED ORDER — TETANUS-DIPHTH-ACELL PERTUSSIS 5-2.5-18.5 LF-MCG/0.5 IM SUSP
0.5000 mL | Freq: Once | INTRAMUSCULAR | Status: AC
  Administered 2019-08-12: 0.5 mL via INTRAMUSCULAR
  Filled 2019-08-12: qty 0.5

## 2019-08-12 MED ORDER — CEFAZOLIN SODIUM-DEXTROSE 2-4 GM/100ML-% IV SOLN
2.0000 g | Freq: Once | INTRAVENOUS | Status: AC
  Administered 2019-08-12: 2 g via INTRAVENOUS
  Filled 2019-08-12: qty 100
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
## 2019-08-12 NOTE — Discharge Instructions (Addendum)
Call Dr. Bryson Ha office in the morning tomorrow let them know you were in the emergency room.  They will see you in the office probably by Tuesday and discuss whether or not we should repair this nerve.  Please return for any signs of infection increased pain redness or swelling.  Take the Keflex antibiotic 1 pill 4 times a day.  Be sure to follow-up with your primary care doctor.  If you do not have one you can follow-up with the [place] clinic or the [facility] or the [facility] clinic or [employer] care.  You can also try to see the residents clinic at [facility] where the doctors in training are supervised by the fully trained doctors.  The charge less for the visits.  Also [facility] has a charity care program where state residents can go to [facility] and get care.  I would contact 1 of these as soon as possible to get your blood pressure under better control.  If you have not been able to see a doctor by Monday or Tuesday please return here for recheck of your blood pressure.
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
## 2019-08-13 LAB — BRAIN NATRIURETIC PEPTIDE: B Natriuretic Peptide: 36.7 pg/mL (ref 0.0–100.0)

## 2019-08-13 LAB — TROPONIN I (HIGH SENSITIVITY): Troponin I (High Sensitivity): 15 ng/L (ref <18)

## 2019-08-13 MED ORDER — CEPHALEXIN 500 MG PO CAPS: 500.0000 mg | ORAL_CAPSULE | Freq: Four times a day (QID) | ORAL | 0 refills | Status: AC

## 2019-08-13 MED ORDER — HYDROCHLOROTHIAZIDE 25 MG PO TABS: 25.0000 mg | ORAL_TABLET | Freq: Every day | ORAL | 3 refills | Status: DC
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
## 2019-09-19 ENCOUNTER — Emergency Department
Admission: EM | Admit: 2019-09-19 | Discharge: 2019-09-19 | Disposition: A | Discharge status: Home / Self Care | Payer: Self-pay | Attending: Emergency Medicine | Admitting: Emergency Medicine

## 2019-09-19 ENCOUNTER — Other Ambulatory Visit: Payer: Self-pay

## 2019-09-19 DIAGNOSIS — S81801A Unspecified open wound, right lower leg, initial encounter: Secondary | ICD-10-CM | POA: Insufficient documentation

## 2019-09-19 DIAGNOSIS — Z5321 Procedure and treatment not carried out due to patient leaving prior to being seen by health care provider: Secondary | ICD-10-CM | POA: Insufficient documentation

## 2019-09-19 DIAGNOSIS — Y939 Activity, unspecified: Secondary | ICD-10-CM | POA: Insufficient documentation

## 2019-09-19 DIAGNOSIS — Y999 Unspecified external cause status: Secondary | ICD-10-CM | POA: Insufficient documentation

## 2019-09-19 DIAGNOSIS — S81802A Unspecified open wound, left lower leg, initial encounter: Secondary | ICD-10-CM | POA: Insufficient documentation

## 2019-09-19 DIAGNOSIS — Y929 Unspecified place or not applicable: Secondary | ICD-10-CM | POA: Insufficient documentation

## 2019-09-19 NOTE — ED Notes (Signed)
No answer when called x 2

## 2019-09-19 NOTE — ED Notes (Signed)
Pt does not answer when called 
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
## 2019-11-16 ENCOUNTER — Ambulatory Visit: Payer: Self-pay

## 2020-01-30 ENCOUNTER — Encounter: Payer: Self-pay | Admitting: Intensive Care

## 2020-01-30 ENCOUNTER — Emergency Department
Admission: EM | Admit: 2020-01-30 | Discharge: 2020-01-30 | Disposition: A | Discharge status: Home / Self Care | Payer: Self-pay | Attending: Emergency Medicine | Admitting: Emergency Medicine

## 2020-01-30 ENCOUNTER — Emergency Department: Payer: Self-pay

## 2020-01-30 ENCOUNTER — Other Ambulatory Visit: Payer: Self-pay

## 2020-01-30 DIAGNOSIS — Z79899 Other long term (current) drug therapy: Secondary | ICD-10-CM | POA: Insufficient documentation

## 2020-01-30 DIAGNOSIS — I739 Peripheral vascular disease, unspecified: Secondary | ICD-10-CM | POA: Insufficient documentation

## 2020-01-30 DIAGNOSIS — F17210 Nicotine dependence, cigarettes, uncomplicated: Secondary | ICD-10-CM | POA: Insufficient documentation

## 2020-01-30 DIAGNOSIS — R609 Edema, unspecified: Secondary | ICD-10-CM | POA: Insufficient documentation

## 2020-01-30 DIAGNOSIS — I10 Essential (primary) hypertension: Secondary | ICD-10-CM | POA: Insufficient documentation

## 2020-01-30 LAB — COMPREHENSIVE METABOLIC PANEL
ALT: 21 U/L (ref 0–44)
AST: 21 U/L (ref 15–41)
Albumin: 3.7 g/dL (ref 3.5–5.0)
Alkaline Phosphatase: 73 U/L (ref 38–126)
Anion gap: 8 (ref 5–15)
BUN: 12 mg/dL (ref 6–20)
CO2: 29 mmol/L (ref 22–32)
Calcium: 8.5 mg/dL — ABNORMAL LOW (ref 8.9–10.3)
Chloride: 101 mmol/L (ref 98–111)
Creatinine, Ser: 0.99 mg/dL (ref 0.61–1.24)
GFR, Estimated: >60 mL/min (ref >60)
Glucose, Bld: 94 mg/dL (ref 70–99)
Potassium: 3.2 mmol/L — ABNORMAL LOW (ref 3.5–5.1)
Sodium: 138 mmol/L (ref 135–145)
Total Bilirubin: 0.5 mg/dL (ref 0.3–1.2)
Total Protein: 7.3 g/dL (ref 6.5–8.1)

## 2020-01-30 LAB — DIFFERENTIAL
Abs Immature Granulocytes: 0.02 10*3/uL (ref 0.00–0.07)
Basophils Absolute: 0 10*3/uL (ref 0.0–0.1)
Basophils Relative: 0 %
Eosinophils Absolute: 0.2 10*3/uL (ref 0.0–0.5)
Eosinophils Relative: 3 %
Immature Granulocytes: 0 %
Lymphocytes Relative: 36 %
Lymphs Abs: 2.5 10*3/uL (ref 0.7–4.0)
Monocytes Absolute: 0.6 10*3/uL (ref 0.1–1.0)
Monocytes Relative: 8 %
Neutro Abs: 3.6 10*3/uL (ref 1.7–7.7)
Neutrophils Relative %: 53 %

## 2020-01-30 LAB — CBC
HCT: 40.5 % (ref 39.0–52.0)
Hemoglobin: 12.7 g/dL — ABNORMAL LOW (ref 13.0–17.0)
MCH: 25.3 pg — ABNORMAL LOW (ref 26.0–34.0)
MCHC: 31.4 g/dL (ref 30.0–36.0)
MCV: 80.7 fL (ref 80.0–100.0)
Platelets: 218 10*3/uL (ref 150–400)
RBC: 5.02 MIL/uL (ref 4.22–5.81)
RDW: 16.8 % — ABNORMAL HIGH (ref 11.5–15.5)
WBC: 6.8 10*3/uL (ref 4.0–10.5)
nRBC: 0 % (ref 0.0–0.2)

## 2020-01-30 LAB — APTT: aPTT: 30 seconds (ref 24–36)

## 2020-01-30 LAB — PROTIME-INR
INR: 1 (ref 0.8–1.2)
Prothrombin Time: 12.4 seconds (ref 11.4–15.2)

## 2020-01-30 MED ORDER — AMLODIPINE BESYLATE 5 MG PO TABS
10.0000 mg | ORAL_TABLET | Freq: Once | ORAL | Status: AC
  Administered 2020-01-30: 17:00:00 10 mg via ORAL
  Filled 2020-01-30: qty 2

## 2020-01-30 MED ORDER — AMLODIPINE BESYLATE 10 MG PO TABS: 10.0000 mg | ORAL_TABLET | Freq: Every day | ORAL | 3 refills | Status: DC

## 2020-01-30 MED ORDER — FUROSEMIDE 20 MG PO TABS: 20.0000 mg | ORAL_TABLET | Freq: Every day | ORAL | 3 refills | Status: DC

## 2020-01-30 MED ORDER — FUROSEMIDE 40 MG PO TABS
40.0000 mg | ORAL_TABLET | Freq: Once | ORAL | Status: AC
  Administered 2020-01-30: 17:00:00 40 mg via ORAL
  Filled 2020-01-30: qty 1
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
## 2020-01-30 NOTE — ED Triage Notes (Addendum)
Patient c/o bilateral leg and feet swelling. Also reports he has some open wounds on back of legs. Reports he comes to ER for lasix X3 years. Does not have PCP. HX HTN but only takes medication if he gets prescribed while at ER. C/o headaches intermittently and blurry vision when first waking up that subsides after a few minutes.

## 2020-01-30 NOTE — ED Notes (Addendum)
Pt to ED c/o bilateral leg pain and swelling that became worse about 1 week ago. +1 pitting edema noted both legs up to knees. Pt has been taking furosemide for past 3 years but frequently runs out of prescription, and states usually goes to EDs and obtains prescriptions for furosemide when his legs swell again. Pt has not taken Lasix for about past 1 month. No SOB noted. 98% SPO2 on RA.

## 2020-02-01 ENCOUNTER — Telehealth: Payer: Self-pay | Admitting: Family

## 2020-02-01 NOTE — Telephone Encounter (Signed)
Unable to reach patient at this time in attempt to schedule a new patient CHF Clinic appointment after receiving a referral from ED.   Jared Mccoy, NT

## 2020-02-09 NOTE — Progress Notes (Signed)
Patient ID: Jared Mccoy, adult    DOB: 1990/12/13, 29 y.o.   MRN: [id number]  HPI  Mr Kropf is a 29 y/o male with a history of HTN, obesity, PVD, current tobacco use and possible HF.  No echo to review.   Was in the ED 01/30/20 due to leg swelling and HTN. Diuretic was started and he was released.   He presents today for his initial visit with a chief complaint of minimal fatigue with moderate exertion. He's unclear of duration of symptoms. He has associated cough, shortness of breath, pedal edema, leg pain and open wounds on the backs of his legs. He denies any difficulty sleeping, dizziness, abdominal distention, palpitations, chest pain or head congestion.   Does not have scales at home.  Currently has no insurance and says that he goes to the ED to get his medications refilled and tends to only go when his legs hurt. Says that it's difficult to elevate his legs.   Past Medical History:  Diagnosis Date   Hypertension    not on medication   Obese    Peripheral vascular disease (HCC)    edema in legs    Past Surgical History:  Procedure Laterality Date   MANDIBULAR HARDWARE REMOVAL N/A 12/27/2016   Procedure: MANDIBULAR HARDWARE REMOVAL;  Surgeon: Newman Pies, MD;  Location: [facility] OR;  Service: ENT;  Laterality: N/A;   OPEN REDUCTION INTERNAL FIXATION (ORIF) DISTAL RADIAL FRACTURE Left 11/10/2016   Procedure: OPEN REDUCTION INTERNAL FIXATION (ORIF) DISTAL RADIAL FRACTURE;  Surgeon: Mack Hook, MD;  Location: [facility] OR;  Service: Orthopedics;  Laterality: Left;   ORIF MANDIBULAR FRACTURE Right 11/10/2016   Procedure: OPEN REDUCTION INTERNAL FIXATION (ORIF) MANDIBULAR FRACTURE, Ear Laceration Repair;  Surgeon: Newman Pies, MD;  Location: [facility] OR;  Service: ENT;  Laterality: Right;   History reviewed. No pertinent family history. Social History   Tobacco Use   Smoking status: Current Every Day Smoker    Packs/day: 0.50    Years: 20.00    Pack years: 10.00    Types: Cigarettes    Smokeless tobacco: Never Used  Substance Use Topics   Alcohol use: Yes    Comment: 12/26/16- 1 fifth a week   No Known Allergies Prior to Admission medications   Medication Sig Start Date End Date Taking? Authorizing Provider  amLODipine (NORVASC) 10 MG tablet Take 1 tablet (10 mg total) by mouth daily. 01/30/20 05/29/20 Yes Minna Antis, MD  ibuprofen (ADVIL,MOTRIN) 200 MG tablet Take 400 mg every 8 (eight) hours as needed by mouth for moderate pain (mouth/jaw pain).    Yes [provider]  furosemide (LASIX) 20 MG tablet Take 1 tablet (20 mg total) by mouth daily. 01/30/20  Yes Delma Freeze, FNP    Review of Systems  Constitutional: Positive for fatigue. Negative for appetite change.  HENT: Negative for congestion, postnasal drip and sore throat.   Eyes: Negative.   Respiratory: Positive for cough and shortness of breath. Negative for chest tightness.   Cardiovascular: Positive for leg swelling. Negative for chest pain and palpitations.  Gastrointestinal: Negative for abdominal distention and abdominal pain.  Endocrine: Negative.   Genitourinary: Negative.   Musculoskeletal: Positive for arthralgias (legs hurt). Negative for back pain and neck pain.  Skin: Positive for wound (open wounds back of both legs).  Allergic/Immunologic: Negative.   Neurological: Negative for dizziness and light-headedness.  Hematological: Negative for adenopathy. Does not bruise/bleed easily.  Psychiatric/Behavioral: Negative for dysphoric mood and sleep disturbance (sleeping  on 1 pillow). The patient is not nervous/anxious.    Vitals:   02/10/20 0921  BP: (!) 185/98  Pulse: 80  Resp: 20  SpO2: 95%  Weight: (!) 501 lb 2 oz (227.3 kg)  Height: 6' (1.829 m)   Wt Readings from Last 3 Encounters:  02/10/20 (!) 501 lb 2 oz (227.3 kg)  01/30/20 (!) 500 lb (226.8 kg)  09/19/19 (!) 522 lb (236.8 kg)   Lab Results  Component Value Date   CREATININE 0.99 01/30/2020   CREATININE  1.20 08/12/2019   CREATININE 0.87 12/27/2016   Physical Exam Vitals and nursing note reviewed.  Constitutional:      Appearance: Normal appearance.  HENT:     Head: Normocephalic and atraumatic.  Cardiovascular:     Rate and Rhythm: Normal rate and regular rhythm.  Pulmonary:     Effort: No respiratory distress.     Breath sounds: Wheezing (LUL/ LLL) and rhonchi (RLL/ cleared with cough) present. No rales.     Comments: Diminished due to chest wall size Abdominal:     General: There is no distension.     Palpations: Abdomen is soft.  Musculoskeletal:     Cervical back: Normal range of motion and neck supple.     Right lower leg: Edema (lymphedema/ woody skin) present.     Left lower leg: Edema (lymphedema/ woody skin) present.  Skin:    General: Skin is warm and dry.  Neurological:     General: No focal deficit present.     Mental Status: He is alert and oriented to person, place, and time.  Psychiatric:        Mood and Affect: Mood normal.        Behavior: Behavior normal.    Assessment & Plan:  1: Heart failure with unknown ejection fraction- - NYHA class II - euvolemic but difficult to accurately assess due to obesity - does not have scales so a set that goes up to 550 pounds was given to him today; instructed him to weigh daily, write the weight down and call for an overnight weight gain of > 2 pounds or a weekly weight gain of >5 pounds - not adding salt; written dietary information was given to him regarding a 2000mg  sodium diet - will schedule echo to evaluate EF & this was scheduled for 03/01/20 - increase furosemide to 40mg  daily along with adding potassium 03/03/20 daily - BNP 08/12/19 was 36.7 - would benefit from sleep study to rule out sleep apnea  2: HTN- - BP elevated - add losartan 25mg  daily - has not had a PCP and goes to the ED for medications due to not having insurance; emphasized that he needed to get established with [facility] for long-term care  so he would have better continuity of care - [facility] information given to him - BMP 01/30/20 reviewed and showed sodium 138, potassium 3.2, creatinine 0.99 and GFR >60 - repeat BMP today; may need to also repeat at next visit  3: Tobacco- - smokes 7-8 cigarettes daily - denies daily alcohol; only occasional use - complete cessation discussed  4: Lymphedema- - stage II/III - does not elevate his legs much due to the pain - unable to wear compression socks due to size of legs/ nonhealing wounds - unable to get much exercise due to leg pain - may need to go to the wound clinic vs lymphedema clinic due to wounds - consider lymphapress compression boots if able to  be fitted   Need to give cone assistance paperwork to patient since he's uninsured. Will have ready for him at his next visit.   Return in 3 weeks or sooner for any questions/problems before then.

## 2020-02-10 ENCOUNTER — Ambulatory Visit: Payer: Self-pay | Attending: Family | Admitting: Family

## 2020-02-10 ENCOUNTER — Other Ambulatory Visit
Admission: RE | Admit: 2020-02-10 | Discharge: 2020-02-10 | Disposition: A | Discharge status: Home / Self Care | Payer: Self-pay | Source: Ambulatory Visit | Attending: Family | Admitting: Family

## 2020-02-10 ENCOUNTER — Encounter: Payer: Self-pay | Admitting: Family

## 2020-02-10 ENCOUNTER — Other Ambulatory Visit: Payer: Self-pay

## 2020-02-10 ENCOUNTER — Telehealth: Payer: Self-pay | Admitting: Family

## 2020-02-10 VITALS — BP 185/98 | HR 80 | Resp 20 | Ht 72.0 in | Wt >= 6400 oz

## 2020-02-10 DIAGNOSIS — Z79899 Other long term (current) drug therapy: Secondary | ICD-10-CM | POA: Insufficient documentation

## 2020-02-10 DIAGNOSIS — E669 Obesity, unspecified: Secondary | ICD-10-CM | POA: Insufficient documentation

## 2020-02-10 DIAGNOSIS — Z720 Tobacco use: Secondary | ICD-10-CM

## 2020-02-10 DIAGNOSIS — Z7289 Other problems related to lifestyle: Secondary | ICD-10-CM | POA: Insufficient documentation

## 2020-02-10 DIAGNOSIS — F17210 Nicotine dependence, cigarettes, uncomplicated: Secondary | ICD-10-CM | POA: Insufficient documentation

## 2020-02-10 DIAGNOSIS — I890 Lymphedema, not elsewhere classified: Secondary | ICD-10-CM | POA: Insufficient documentation

## 2020-02-10 DIAGNOSIS — I10 Essential (primary) hypertension: Secondary | ICD-10-CM

## 2020-02-10 DIAGNOSIS — I110 Hypertensive heart disease with heart failure: Secondary | ICD-10-CM | POA: Insufficient documentation

## 2020-02-10 DIAGNOSIS — Z6841 Body Mass Index (BMI) 40.0 and over, adult: Secondary | ICD-10-CM | POA: Insufficient documentation

## 2020-02-10 DIAGNOSIS — I509 Heart failure, unspecified: Secondary | ICD-10-CM | POA: Insufficient documentation

## 2020-02-10 DIAGNOSIS — I739 Peripheral vascular disease, unspecified: Secondary | ICD-10-CM | POA: Insufficient documentation

## 2020-02-10 LAB — BASIC METABOLIC PANEL
Anion gap: 8 (ref 5–15)
BUN: 13 mg/dL (ref 6–20)
CO2: 31 mmol/L (ref 22–32)
Calcium: 8.5 mg/dL — ABNORMAL LOW (ref 8.9–10.3)
Chloride: 102 mmol/L (ref 98–111)
Creatinine, Ser: 0.87 mg/dL (ref 0.61–1.24)
GFR, Estimated: >60 mL/min (ref >60)
Glucose, Bld: 100 mg/dL — ABNORMAL HIGH (ref 70–99)
Potassium: 3.1 mmol/L — ABNORMAL LOW (ref 3.5–5.1)
Sodium: 141 mmol/L (ref 135–145)

## 2020-02-10 MED ORDER — POTASSIUM CHLORIDE CRYS ER 20 MEQ PO TBCR: 20.0000 meq | EXTENDED_RELEASE_TABLET | Freq: Every day | ORAL | 3 refills | Status: DC

## 2020-02-10 MED ORDER — FUROSEMIDE 40 MG PO TABS: 40.0000 mg | ORAL_TABLET | Freq: Every day | ORAL | 3 refills | Status: DC

## 2020-02-10 MED ORDER — LOSARTAN POTASSIUM 25 MG PO TABS: 25.0000 mg | ORAL_TABLET | Freq: Every day | ORAL | 3 refills | Status: DC

## 2020-02-10 MED ORDER — POTASSIUM CHLORIDE CRYS ER 20 MEQ PO TBCR: 40.0000 meq | EXTENDED_RELEASE_TABLET | Freq: Every day | ORAL | 3 refills | Status: DC

## 2020-02-10 NOTE — Patient Instructions (Addendum)
Begin weighing daily and call for an overnight weight gain of > 2 pounds or a weekly weight gain of >5 pounds.   Increase lasix to 40mg  daily Start potassium daily (1 tablet) Being losartan 25mg  daily   [facility]   [phone]  [address] [place] [address] [place]  Hours: Tuesday: 4:15 pm - 7:30 pm  Wednesday: 9 am - 1 pm  Thursday: 1 pm - 7:30 pm  Friday - Monday: CLOSED

## 2020-02-10 NOTE — Telephone Encounter (Signed)
Spoke with patient regarding BMP results obtained earlier today. Renal function looks good but potassium remains low at 3.1.  Earlier today I had started losartan and potassium ( daily) and patient says that he hasn't picked up the medications yet.  Explained that I would send in a new prescription for his potassium because he will need to take 2 tablets daily instead of 1. Will plan on rechecking labs at his next visit  Patient verbalized understanding

## 2020-02-29 NOTE — Progress Notes (Deleted)
Patient ID: Jared Mccoy, adult    DOB: 24-Mar-1990, 30 y.o.   MRN: [id number]  HPI  Jared Mccoy is a 30 y/o male with a history of HTN, obesity, PVD, current tobacco use and possible HF.  No echo to review.   Was in the ED 01/30/20 due to leg swelling and HTN. Diuretic was started and he was released.   He presents today for a follow-up visit with a chief complaint of   Past Medical History:  Diagnosis Date  . Hypertension    not on medication  . Obese   . Peripheral vascular disease (HCC)    edema in legs    Past Surgical History:  Procedure Laterality Date  . MANDIBULAR HARDWARE REMOVAL N/A 12/27/2016   Procedure: MANDIBULAR HARDWARE REMOVAL;  Surgeon: Newman Pies, MD;  Location: [facility] OR;  Service: ENT;  Laterality: N/A;  . OPEN REDUCTION INTERNAL FIXATION (ORIF) DISTAL RADIAL FRACTURE Left 11/10/2016   Procedure: OPEN REDUCTION INTERNAL FIXATION (ORIF) DISTAL RADIAL FRACTURE;  Surgeon: Mack Hook, MD;  Location: [facility] OR;  Service: Orthopedics;  Laterality: Left;  . ORIF MANDIBULAR FRACTURE Right 11/10/2016   Procedure: OPEN REDUCTION INTERNAL FIXATION (ORIF) MANDIBULAR FRACTURE, Ear Laceration Repair;  Surgeon: Newman Pies, MD;  Location: [facility] OR;  Service: ENT;  Laterality: Right;   No family history on file. Social History   Tobacco Use  . Smoking status: Current Every Day Smoker    Packs/day: 0.50    Years: 20.00    Pack years: 10.00    Types: Cigarettes  . Smokeless tobacco: Never Used  Substance Use Topics  . Alcohol use: Yes    Comment: 12/26/16- 1 fifth a week   No Known Allergies   Review of Systems  Constitutional: Positive for fatigue. Negative for appetite change.  HENT: Negative for congestion, postnasal drip and sore throat.   Eyes: Negative.   Respiratory: Positive for cough and shortness of breath. Negative for chest tightness.   Cardiovascular: Positive for leg swelling. Negative for chest pain and palpitations.  Gastrointestinal: Negative for abdominal  distention and abdominal pain.  Endocrine: Negative.   Genitourinary: Negative.   Musculoskeletal: Positive for arthralgias (legs hurt). Negative for back pain and neck pain.  Skin: Positive for wound (open wounds back of both legs).  Allergic/Immunologic: Negative.   Neurological: Negative for dizziness and light-headedness.  Hematological: Negative for adenopathy. Does not bruise/bleed easily.  Psychiatric/Behavioral: Negative for dysphoric mood and sleep disturbance (sleeping on 1 pillow). The patient is not nervous/anxious.    Physical Exam Vitals and nursing note reviewed.  Constitutional:      Appearance: Normal appearance.  HENT:     Head: Normocephalic and atraumatic.  Cardiovascular:     Rate and Rhythm: Normal rate and regular rhythm.  Pulmonary:     Effort: No respiratory distress.     Breath sounds: Wheezing (LUL/ LLL) and rhonchi (RLL/ cleared with cough) present. No rales.     Comments: Diminished due to chest wall size Abdominal:     General: There is no distension.     Palpations: Abdomen is soft.  Musculoskeletal:     Cervical back: Normal range of motion and neck supple.     Right lower leg: Edema (lymphedema/ woody skin) present.     Left lower leg: Edema (lymphedema/ woody skin) present.  Skin:    General: Skin is warm and dry.  Neurological:     General: No focal deficit present.     Mental Status: He  is alert and oriented to person, place, and time.  Psychiatric:        Mood and Affect: Mood normal.        Behavior: Behavior normal.    Assessment & Plan:  1: Heart failure with unknown ejection fraction- - NYHA class II - euvolemic but difficult to accurately assess due to obesity - weighing daily; reminded to call for an overnight weight gain of > 2 pounds or a weekly weight gain of >5 pounds - weight 501.2 from lat visit here 3 weeks ago - not adding salt - echo completed earlier today - furosemide increased and potassium added at last visit -  check BMP today - BNP 08/12/19 was 36.7 - would benefit from sleep study to rule out sleep apnea  2: HTN- - BP  - losartan added at last visit - [facility] information given to him - BMP 02/10/20 reviewed and showed sodium 141, potassium 3.1, creatinine 0.87 and GFR >60  3: Tobacco- - smokes 7-8 cigarettes daily - denies daily alcohol; only occasional use - complete cessation discussed  4: Lymphedema- - stage II/III - does not elevate his legs much due to the pain - unable to wear compression socks due to size of legs/ nonhealing wounds - unable to get much exercise due to leg pain - may need to go to the wound clinic vs lymphedema clinic due to wounds - consider lymphapress compression boots if able to be fitted

## 2020-03-01 ENCOUNTER — Telehealth: Payer: Self-pay | Admitting: Family

## 2020-03-01 ENCOUNTER — Ambulatory Visit: Payer: Self-pay | Admitting: Family

## 2020-03-01 ENCOUNTER — Ambulatory Visit: Admission: RE | Admit: 2020-03-01 | Payer: Self-pay | Source: Ambulatory Visit

## 2020-03-01 NOTE — Telephone Encounter (Signed)
Patient did not show for his [facility] appointment on 03/01/20. Will attempt to reschedule.

## 2020-03-20 ENCOUNTER — Telehealth: Payer: Self-pay | Admitting: Family

## 2020-03-20 NOTE — Telephone Encounter (Signed)
Unable to reach patient in attempt to reschedule a [facility] follow up as well as an echo that he missed.   Jaleeyah Munce, NT

## 2020-08-02 ENCOUNTER — Emergency Department: Payer: Self-pay

## 2020-08-02 ENCOUNTER — Other Ambulatory Visit: Payer: Self-pay

## 2020-08-02 ENCOUNTER — Encounter: Payer: Self-pay | Admitting: Emergency Medicine

## 2020-08-02 ENCOUNTER — Emergency Department
Admission: EM | Admit: 2020-08-02 | Discharge: 2020-08-02 | Disposition: A | Discharge status: Home / Self Care | Payer: Self-pay | Attending: Emergency Medicine | Admitting: Emergency Medicine

## 2020-08-02 DIAGNOSIS — I10 Essential (primary) hypertension: Secondary | ICD-10-CM | POA: Insufficient documentation

## 2020-08-02 DIAGNOSIS — E876 Hypokalemia: Secondary | ICD-10-CM | POA: Insufficient documentation

## 2020-08-02 DIAGNOSIS — F17210 Nicotine dependence, cigarettes, uncomplicated: Secondary | ICD-10-CM | POA: Insufficient documentation

## 2020-08-02 DIAGNOSIS — M7989 Other specified soft tissue disorders: Secondary | ICD-10-CM | POA: Insufficient documentation

## 2020-08-02 LAB — BRAIN NATRIURETIC PEPTIDE: B Natriuretic Peptide: 17.4 pg/mL (ref 0.0–100.0)

## 2020-08-02 LAB — CBC WITH DIFFERENTIAL/PLATELET
Abs Immature Granulocytes: 0.02 10*3/uL (ref 0.00–0.07)
Basophils Absolute: 0 10*3/uL (ref 0.0–0.1)
Basophils Relative: 0 %
Eosinophils Absolute: 0.2 10*3/uL (ref 0.0–0.5)
Eosinophils Relative: 3 %
HCT: 36.2 % — ABNORMAL LOW (ref 39.0–52.0)
Hemoglobin: 11.3 g/dL — ABNORMAL LOW (ref 13.0–17.0)
Immature Granulocytes: 0 %
Lymphocytes Relative: 37 %
Lymphs Abs: 2.6 10*3/uL (ref 0.7–4.0)
MCH: 24.8 pg — ABNORMAL LOW (ref 26.0–34.0)
MCHC: 31.2 g/dL (ref 30.0–36.0)
MCV: 79.4 fL — ABNORMAL LOW (ref 80.0–100.0)
Monocytes Absolute: 0.6 10*3/uL (ref 0.1–1.0)
Monocytes Relative: 9 %
Neutro Abs: 3.7 10*3/uL (ref 1.7–7.7)
Neutrophils Relative %: 51 %
Platelets: 228 10*3/uL (ref 150–400)
RBC: 4.56 MIL/uL (ref 4.22–5.81)
RDW: 17.3 % — ABNORMAL HIGH (ref 11.5–15.5)
WBC: 7.1 10*3/uL (ref 4.0–10.5)
nRBC: 0 % (ref 0.0–0.2)

## 2020-08-02 LAB — COMPREHENSIVE METABOLIC PANEL
ALT: 20 U/L (ref 0–44)
AST: 23 U/L (ref 15–41)
Albumin: 3.6 g/dL (ref 3.5–5.0)
Alkaline Phosphatase: 69 U/L (ref 38–126)
Anion gap: 10 (ref 5–15)
BUN: 12 mg/dL (ref 6–20)
CO2: 29 mmol/L (ref 22–32)
Calcium: 8.4 mg/dL — ABNORMAL LOW (ref 8.9–10.3)
Chloride: 102 mmol/L (ref 98–111)
Creatinine, Ser: 1.04 mg/dL (ref 0.61–1.24)
GFR, Estimated: >60 mL/min (ref >60)
Glucose, Bld: 88 mg/dL (ref 70–99)
Potassium: 2.9 mmol/L — ABNORMAL LOW (ref 3.5–5.1)
Sodium: 141 mmol/L (ref 135–145)
Total Bilirubin: 0.6 mg/dL (ref 0.3–1.2)
Total Protein: 7.2 g/dL (ref 6.5–8.1)

## 2020-08-02 MED ORDER — POTASSIUM CHLORIDE CRYS ER 20 MEQ PO TBCR: 40.0000 meq | EXTENDED_RELEASE_TABLET | Freq: Every day | ORAL | 0 refills | Status: DC

## 2020-08-02 MED ORDER — POTASSIUM CHLORIDE CRYS ER 20 MEQ PO TBCR
40.0000 meq | EXTENDED_RELEASE_TABLET | Freq: Once | ORAL | Status: AC
  Administered 2020-08-02: 21:00:00 40 meq via ORAL
  Filled 2020-08-02: qty 2

## 2020-08-02 NOTE — ED Provider Notes (Signed)
[facility]-EMERGENCY DEPARTMENT  ____________________________________________  Time seen: Approximately 8:29 PM  I have reviewed the triage vital signs and the nursing notes.   HISTORY  Chief Complaint Leg Pain   Historian Patient     HPI Jared Mccoy is a 30 y.o. adult with a history of hypertension, CHF obesity, peripheral insufficiency, peripheral edema and venous stasis dermatitis, presents to the emergency department with worsening swelling of the bilateral lower extremities.  Patient states that he has had weeping of serosanguineous exudate.  He denies fever and chills.  He reports that he has dull calf pain bilaterally that seems worse than his baseline.  He denies chest pain, chest tightness or shortness of breath.  He has been able to ambulate but reports that he has not been as active as usual.  No prior history of DVT or PE.   Past Medical History:  Diagnosis Date   Hypertension    not on medication   Obese    Peripheral vascular disease (HCC)    edema in legs      Immunizations up to date:  Yes.     Past Medical History:  Diagnosis Date   Hypertension    not on medication   Obese    Peripheral vascular disease (HCC)    edema in legs     Patient Active Problem List   Diagnosis Date Noted   Multiple trauma 11/10/2016    Past Surgical History:  Procedure Laterality Date   MANDIBULAR HARDWARE REMOVAL N/A 12/27/2016   Procedure: MANDIBULAR HARDWARE REMOVAL;  Surgeon: Newman Pies, MD;  Location: [facility] OR;  Service: ENT;  Laterality: N/A;   OPEN REDUCTION INTERNAL FIXATION (ORIF) DISTAL RADIAL FRACTURE Left 11/10/2016   Procedure: OPEN REDUCTION INTERNAL FIXATION (ORIF) DISTAL RADIAL FRACTURE;  Surgeon: Mack Hook, MD;  Location: [facility] OR;  Service: Orthopedics;  Laterality: Left;   ORIF MANDIBULAR FRACTURE Right 11/10/2016   Procedure: OPEN REDUCTION INTERNAL FIXATION (ORIF) MANDIBULAR FRACTURE, Ear Laceration Repair;  Surgeon: Newman Pies, MD;  Location: [facility] OR;   Service: ENT;  Laterality: Right;    Prior to Admission medications   Medication Sig Start Date End Date Taking? Authorizing Provider  potassium chloride SA (KLOR-CON) 20 MEQ tablet Take 2 tablets (40 mEq total) by mouth daily for 5 days. 08/02/20 08/07/20 Yes Pia Mau M, PA-C  amLODipine (NORVASC) 10 MG tablet Take 1 tablet (10 mg total) by mouth daily. 01/30/20 05/29/20  Minna Antis, MD  furosemide (LASIX) 40 MG tablet Take 1 tablet (40 mg total) by mouth daily. 02/10/20 06/09/20  Delma Freeze, FNP  ibuprofen (ADVIL,MOTRIN) 200 MG tablet Take 400 mg every 8 (eight) hours as needed by mouth for moderate pain (mouth/jaw pain).     [provider]  losartan (COZAAR) 25 MG tablet Take 1 tablet (25 mg total) by mouth daily. 02/10/20 05/10/20  Delma Freeze, FNP    Allergies Patient has no known allergies.  History reviewed. No pertinent family history.  Social History Social History   Tobacco Use   Smoking status: Every Day    Packs/day: 0.50    Years: 20.00    Pack years: 10.00    Types: Cigarettes   Smokeless tobacco: Never  Vaping Use   Vaping Use: Never used  Substance Use Topics   Alcohol use: Yes    Comment: 12/26/16- 1 fifth a week   Drug use: No     Review of Systems  Constitutional: No fever/chills Eyes:  No discharge ENT: No upper  respiratory complaints. Respiratory: no cough. No SOB/ use of accessory muscles to breath Gastrointestinal:   No nausea, no vomiting.  No diarrhea.  No constipation. Musculoskeletal: Negative for musculoskeletal pain. Skin: Patient has lower extremity swelling.  Patient has venous stasis dermatitis.    ____________________________________________   PHYSICAL EXAM:  VITAL SIGNS: ED Triage Vitals  Enc Vitals Group     BP 08/02/20 1913 (!) 185/112     Pulse Rate 08/02/20 1913 90     Resp 08/02/20 1913 20     Temp 08/02/20 1913 98.5 F (36.9 C)     Temp Source 08/02/20 1913 Oral     SpO2 08/02/20 1913 98 %      Weight 08/02/20 1912 (!) 501 lb (227.3 kg)     Height 08/02/20 1912 6' (1.829 m)     Head Circumference --      Peak Flow --      Pain Score 08/02/20 1918 8     Pain Loc --      Pain Edu? --      Excl. in GC? --      Constitutional: Alert and oriented. Well appearing and in no acute distress. Eyes: Conjunctivae are normal. PERRL. EOMI. Head: Atraumatic. ENT:      Nose: No congestion/rhinnorhea.      Mouth/Throat: Mucous membranes are moist.  Neck: No stridor.  No cervical spine tenderness to palpation. Cardiovascular: Normal rate, regular rhythm. Normal S1 and S2.  Good peripheral circulation. Respiratory: Normal respiratory effort without tachypnea or retractions. Lungs CTAB. Good air entry to the bases with no decreased or absent breath sounds Gastrointestinal: Bowel sounds x 4 quadrants. Soft and nontender to palpation. No guarding or rigidity. No distention. Musculoskeletal: Full range of motion to all extremities. No obvious deformities noted Neurologic:  Normal for age. No gross focal neurologic deficits are appreciated.  Skin: Patient has 3+ pitting edema bilaterally.  Patient has venous stasis dermatitis of the bilateral lower extremities. Psychiatric: Mood and affect are normal for age. Speech and behavior are normal.   ____________________________________________   LABS (all labs ordered are listed, but only abnormal results are displayed)  Labs Reviewed  CBC WITH DIFFERENTIAL/PLATELET - Abnormal; Notable for the following components:      Result Value   Hemoglobin 11.3 (*)    HCT 36.2 (*)    MCV 79.4 (*)    MCH 24.8 (*)    RDW 17.3 (*)    All other components within normal limits  COMPREHENSIVE METABOLIC PANEL - Abnormal; Notable for the following components:   Potassium 2.9 (*)    Calcium 8.4 (*)    All other components within normal limits  BRAIN NATRIURETIC PEPTIDE    ____________________________________________  EKG   ____________________________________________  RADIOLOGY   Geraldo Pitter, personally viewed and evaluated these images (plain radiographs) as part of my medical decision making, as well as reviewing the written report by the radiologist.    US Venous Img Lower Bilateral (DVT)  Result Date: 08/02/2020 CLINICAL DATA:  Bilateral leg pain EXAM: BILATERAL LOWER EXTREMITY VENOUS DOPPLER ULTRASOUND TECHNIQUE: Gray-scale sonography with compression, as well as color and duplex ultrasound, were performed to evaluate the deep venous system(s) from the level of the common femoral vein through the popliteal and proximal calf veins. COMPARISON:  None. FINDINGS: VENOUS Normal compressibility of the common femoral, superficial femoral, and popliteal veins, as well as the visualized calf veins. Visualized portions of profunda femoral vein and great saphenous vein unremarkable. No  filling defects to suggest DVT on grayscale or color Doppler imaging. Doppler waveforms show normal direction of venous flow, normal respiratory plasticity and response to augmentation. OTHER None. Limitations: none IMPRESSION: Negative. Electronically Signed   By: Helyn Numbers MD   On: 08/02/2020 21:28    ____________________________________________    PROCEDURES  Procedure(s) performed:     Procedures     Medications  potassium chloride SA (KLOR-CON) CR tablet 40 mEq (40 mEq Oral Given 08/02/20 2117)     ____________________________________________   INITIAL IMPRESSION / ASSESSMENT AND PLAN / ED COURSE  Pertinent labs & imaging results that were available during my care of the patient were reviewed by me and considered in my medical decision making (see chart for details).      Assessment and Plan:  Hypokalemia Leg swelling  30 year old male presents to the emergency department with bilateral leg swelling that has occurred chronically but seems  worse over the past 2 to 3 days.  Patient was hypertensive at triage vital signs were otherwise reassuring.  On exam, patient had peripheral edema and signs of peripheral insufficiency.  There is no overlying erythema.  Patient did have fissures in the skin along the lower extremities with production of serosanguineous exudate and signs of venous stasis dermatitis.  CBC was reassuring.  CMP indicated mild hypokalemia.  40 mg of K-Dur was given in the emergency department.  Venous ultrasound bilaterally showed no evidence of DVT.  Patient was given a referral to wound care and compression Ace wraps were applied.  He was discharged with a 5-day course of K-Dur for hypokalemia.      ____________________________________________  FINAL CLINICAL IMPRESSION(S) / ED DIAGNOSES  Final diagnoses:  Leg swelling  Hypokalemia      NEW MEDICATIONS STARTED DURING THIS VISIT:  ED Discharge Orders          Ordered    potassium chloride SA (KLOR-CON) 20 MEQ tablet  Daily        08/02/20 2226                This chart was dictated using voice recognition software/Dragon. Despite best efforts to proofread, errors can occur which can change the meaning. Any change was purely unintentional.     Orvil Feil, PA-C 08/02/20 2331    Willy Eddy, MD 08/05/20 [phone]

## 2020-08-02 NOTE — ED Triage Notes (Signed)
Pt to ED from home c/o bilateral leg pain x3 years, states wounds on posterior legs bothering patient more tonight, states comes in "all the time".  No notable drainage from wounds at this time, pt ambulatory with steady gait, in NAD at this time.

## 2020-08-02 NOTE — Discharge Instructions (Addendum)
You can wear Ace wrap for compression. Please take potassium once daily for the next 5 days. Please make appointment with wound care.

## 2020-10-09 ENCOUNTER — Emergency Department (HOSPITAL_COMMUNITY)
Admission: EM | Admit: 2020-10-09 | Discharge: 2020-10-09 | Disposition: A | Discharge status: Home / Self Care | Payer: BC Managed Care – PPO | Attending: Emergency Medicine | Admitting: Emergency Medicine

## 2020-10-09 ENCOUNTER — Other Ambulatory Visit: Payer: Self-pay

## 2020-10-09 ENCOUNTER — Other Ambulatory Visit (HOSPITAL_COMMUNITY): Payer: Self-pay

## 2020-10-09 ENCOUNTER — Emergency Department (HOSPITAL_BASED_OUTPATIENT_CLINIC_OR_DEPARTMENT_OTHER): Payer: BC Managed Care – PPO

## 2020-10-09 ENCOUNTER — Encounter (HOSPITAL_COMMUNITY): Payer: Self-pay | Admitting: Emergency Medicine

## 2020-10-09 DIAGNOSIS — I10 Essential (primary) hypertension: Secondary | ICD-10-CM | POA: Diagnosis not present

## 2020-10-09 DIAGNOSIS — M7989 Other specified soft tissue disorders: Secondary | ICD-10-CM | POA: Diagnosis not present

## 2020-10-09 DIAGNOSIS — U071 COVID-19: Secondary | ICD-10-CM | POA: Diagnosis not present

## 2020-10-09 DIAGNOSIS — L03116 Cellulitis of left lower limb: Secondary | ICD-10-CM | POA: Diagnosis not present

## 2020-10-09 DIAGNOSIS — Z79899 Other long term (current) drug therapy: Secondary | ICD-10-CM | POA: Diagnosis not present

## 2020-10-09 DIAGNOSIS — R600 Localized edema: Secondary | ICD-10-CM | POA: Insufficient documentation

## 2020-10-09 DIAGNOSIS — F17210 Nicotine dependence, cigarettes, uncomplicated: Secondary | ICD-10-CM | POA: Insufficient documentation

## 2020-10-09 DIAGNOSIS — E876 Hypokalemia: Secondary | ICD-10-CM | POA: Diagnosis not present

## 2020-10-09 DIAGNOSIS — R519 Headache, unspecified: Secondary | ICD-10-CM | POA: Diagnosis present

## 2020-10-09 LAB — BASIC METABOLIC PANEL
Anion gap: 7 (ref 5–15)
BUN: 13 mg/dL (ref 6–20)
CO2: 28 mmol/L (ref 22–32)
Calcium: 8.6 mg/dL — ABNORMAL LOW (ref 8.9–10.3)
Chloride: 100 mmol/L (ref 98–111)
Creatinine, Ser: 1.28 mg/dL — ABNORMAL HIGH (ref 0.61–1.24)
GFR, Estimated: >60 mL/min (ref >60)
Glucose, Bld: 106 mg/dL — ABNORMAL HIGH (ref 70–99)
Potassium: 2.9 mmol/L — ABNORMAL LOW (ref 3.5–5.1)
Sodium: 135 mmol/L (ref 135–145)

## 2020-10-09 LAB — CBC
HCT: 36.4 % — ABNORMAL LOW (ref 39.0–52.0)
Hemoglobin: 11.5 g/dL — ABNORMAL LOW (ref 13.0–17.0)
MCH: 25.2 pg — ABNORMAL LOW (ref 26.0–34.0)
MCHC: 31.6 g/dL (ref 30.0–36.0)
MCV: 79.6 fL — ABNORMAL LOW (ref 80.0–100.0)
Platelets: 143 10*3/uL — ABNORMAL LOW (ref 150–400)
RBC: 4.57 MIL/uL (ref 4.22–5.81)
RDW: 17.1 % — ABNORMAL HIGH (ref 11.5–15.5)
WBC: 10.7 10*3/uL — ABNORMAL HIGH (ref 4.0–10.5)
nRBC: 0 % (ref 0.0–0.2)

## 2020-10-09 MED ORDER — ONDANSETRON 4 MG PO TBDP
4.0000 mg | ORAL_TABLET | Freq: Three times a day (TID) | ORAL | 0 refills | Status: DC | PRN
  Filled 2020-10-09: qty 20, 7d supply, fill #0

## 2020-10-09 MED ORDER — NIRMATRELVIR/RITONAVIR (PAXLOVID)TABLET
3.0000 | ORAL_TABLET | Freq: Two times a day (BID) | ORAL | 0 refills | Status: AC
  Filled 2020-10-09: qty 30, 5d supply, fill #0

## 2020-10-09 MED ORDER — CEPHALEXIN 500 MG PO CAPS
500.0000 mg | ORAL_CAPSULE | Freq: Four times a day (QID) | ORAL | 0 refills | Status: DC
  Filled 2020-10-09: qty 28, 7d supply, fill #0

## 2020-10-09 MED ORDER — POTASSIUM CHLORIDE CRYS ER 20 MEQ PO TBCR
40.0000 meq | EXTENDED_RELEASE_TABLET | Freq: Once | ORAL | Status: AC
  Administered 2020-10-09: 40 meq via ORAL
  Filled 2020-10-09: qty 2

## 2020-10-09 MED ORDER — LOSARTAN POTASSIUM 25 MG PO TABS
25.0000 mg | ORAL_TABLET | Freq: Every day | ORAL | Status: DC
  Administered 2020-10-09: 25 mg via ORAL
  Filled 2020-10-09: qty 1

## 2020-10-09 MED ORDER — AMLODIPINE BESYLATE 5 MG PO TABS
10.0000 mg | ORAL_TABLET | Freq: Every day | ORAL | Status: DC
  Administered 2020-10-09: 10 mg via ORAL
  Filled 2020-10-09: qty 2

## 2020-10-09 NOTE — Discharge Instructions (Addendum)
Take the medications as prescribed for your COVID infection as well as the cellulitis of your leg.  The Zofran is something to take for nausea.  Follow-up with your doctor to have your leg rechecked.  Make sure to take your blood pressure medications regularly

## 2020-10-09 NOTE — ED Triage Notes (Signed)
Pt BIBA coming from home with c/o covid symptoms. Pt reports a headache, cough, SOB with exertion and diarrhea. Pt took home test yesterday and it was positive. Hx of HTN and has not taken BP meds in the last two days.    CBG 103 HR-90 BP-170/100

## 2020-10-09 NOTE — Progress Notes (Signed)
Left lower extremity venous duplex has been completed. Preliminary results can be found in CV Proc through chart review.  Results were given to Dr. Lynelle Doctor.  10/09/20 11:59 AM Olen Cordial RVT

## 2020-10-09 NOTE — ED Provider Notes (Signed)
[facility]-EMERGENCY DEPT Provider Note   CSN: [id number] Arrival date & time: 10/09/20  0710     History Chief Complaint  Patient presents with   Covid Positive    ELL TISO is a 30 y.o. adult.  HPI  Patient presents to the ED for evaluation of covid symptoms.  Patient states he has had symptoms for the last 4 days.  He has had headache cough some diarrhea in shortness of breath with activity.  He took a home COVID test yesterday and it was positive.  Patient came to the ED today because he is feeling very poorly.  He is tired.  He supposed to go back to work [employer].  He denies any fevers.  No vomiting.  Patient also has a history of hypertension and has not taken any of his medications in the last couple days.  He has also not tried any over-the-counter medications for his covid.  He has also noticed left leg pain and discomfort.  He attributed to the covid.  Patient does have history of chronic peripheral edema and it is usually worse on the left side but he has noticed some increasing redness and tenderness in that left leg.  Past Medical History:  Diagnosis Date   Hypertension    not on medication   Obese    Peripheral vascular disease (HCC)    edema in legs     Patient Active Problem List   Diagnosis Date Noted   Multiple trauma 11/10/2016    Past Surgical History:  Procedure Laterality Date   MANDIBULAR HARDWARE REMOVAL N/A 12/27/2016   Procedure: MANDIBULAR HARDWARE REMOVAL;  Surgeon: Newman Pies, MD;  Location: [facility] OR;  Service: ENT;  Laterality: N/A;   OPEN REDUCTION INTERNAL FIXATION (ORIF) DISTAL RADIAL FRACTURE Left 11/10/2016   Procedure: OPEN REDUCTION INTERNAL FIXATION (ORIF) DISTAL RADIAL FRACTURE;  Surgeon: Mack Hook, MD;  Location: [facility] OR;  Service: Orthopedics;  Laterality: Left;   ORIF MANDIBULAR FRACTURE Right 11/10/2016   Procedure: OPEN REDUCTION INTERNAL FIXATION (ORIF) MANDIBULAR FRACTURE, Ear Laceration Repair;   Surgeon: Newman Pies, MD;  Location: [facility] OR;  Service: ENT;  Laterality: Right;     OB History     Gravida  0   Para  0   Term  0   Preterm  0   AB  0   Living         SAB  0   IAB  0   Ectopic  0   Multiple      Live Births              No family history on file.  Social History   Tobacco Use   Smoking status: Every Day    Packs/day: 0.50    Years: 20.00    Pack years: 10.00    Types: Cigarettes   Smokeless tobacco: Never  Vaping Use   Vaping Use: Never used  Substance Use Topics   Alcohol use: Yes    Comment: 12/26/16- 1 fifth a week   Drug use: No    Home Medications Prior to Admission medications   Medication Sig Start Date End Date Taking? Authorizing Provider  cephALEXin (KEFLEX) 500 MG capsule Take 1 capsule by mouth 4  times daily. 10/09/20  Yes Linwood Dibbles, MD  ibuprofen (ADVIL,MOTRIN) 200 MG tablet Take 400 mg every 8 (eight) hours as needed by mouth for moderate pain (mouth/jaw pain).    Yes [provider]  nirmatrelvir/ritonavir EUA (  PAXLOVID) 20 x 150 MG & 10 x 100MG  TABS Take 3 tablets by mouth 2 times daily for 5 days. 10/09/20 10/14/20 Yes Linwood DibblesKnapp, Eyoel Throgmorton, MD  ondansetron (ZOFRAN ODT) 4 MG disintegrating tablet Allow 1 tablet to dissolve by mouth every 8  hours as needed for nausea or vomiting. 10/09/20  Yes Linwood DibblesKnapp, Dejanique Ruehl, MD  amLODipine (NORVASC) 10 MG tablet Take 1 tablet (10 mg total) by mouth daily. 01/30/20 05/29/20  Minna AntisPaduchowski, Kevin, MD  furosemide (LASIX) 40 MG tablet Take 1 tablet (40 mg total) by mouth daily. 02/10/20 06/09/20  Delma FreezeHackney, Tina A, FNP  losartan (COZAAR) 25 MG tablet Take 1 tablet (25 mg total) by mouth daily. 02/10/20 05/10/20  Delma FreezeHackney, Tina A, FNP  potassium chloride SA (KLOR-CON) 20 MEQ tablet Take 2 tablets (40 mEq total) by mouth daily for 5 days. 08/02/20 08/07/20  Orvil FeilWoods, Jaclyn M, PA-C    Allergies    Patient has no known allergies.  Review of Systems   Review of Systems  All other systems reviewed and are  negative.  Physical Exam Updated Vital Signs BP (!) 184/102   Pulse 74   Temp 98.8 F (37.1 C) (Oral)   Resp 14   Ht 1.829 m (6')   Wt (!) 227.3 kg   SpO2 99%   BMI 67.96 kg/m   Physical Exam Vitals and nursing note reviewed.  Constitutional:      General: He is not in acute distress.    Appearance: He is well-developed. He is obese.  HENT:     Head: Normocephalic and atraumatic.     Right Ear: External ear normal.     Left Ear: External ear normal.  Eyes:     General: No scleral icterus.       Right eye: No discharge.        Left eye: No discharge.     Conjunctiva/sclera: Conjunctivae normal.  Neck:     Trachea: No tracheal deviation.  Cardiovascular:     Rate and Rhythm: Normal rate and regular rhythm.  Pulmonary:     Effort: Pulmonary effort is normal. No respiratory distress.     Breath sounds: Normal breath sounds. No stridor. No wheezing or rales.  Abdominal:     General: Bowel sounds are normal. There is no distension.     Palpations: Abdomen is soft.     Tenderness: There is no abdominal tenderness. There is no guarding or rebound.  Musculoskeletal:        General: Swelling and tenderness present. No deformity.     Cervical back: Neck supple.     Left lower leg: Edema present.     Comments: Erythema left lower extremity below the knee,  small superficial wound posterior calf  Skin:    General: Skin is warm and dry.     Findings: No rash.  Neurological:     General: No focal deficit present.     Mental Status: He is alert.     Cranial Nerves: No cranial nerve deficit (no facial droop, extraocular movements intact, no slurred speech).     Sensory: No sensory deficit.     Motor: No abnormal muscle tone or seizure activity.     Coordination: Coordination normal.  Psychiatric:        Mood and Affect: Mood normal.    ED Results / Procedures / Treatments   Labs (all labs ordered are listed, but only abnormal results are displayed) Labs Reviewed  CBC -  Abnormal; Notable for the following  components:      Result Value   WBC 10.7 (*)    Hemoglobin 11.5 (*)    HCT 36.4 (*)    MCV 79.6 (*)    MCH 25.2 (*)    RDW 17.1 (*)    Platelets 143 (*)    All other components within normal limits  BASIC METABOLIC PANEL - Abnormal; Notable for the following components:   Potassium 2.9 (*)    Glucose, Bld 106 (*)    Creatinine, Ser 1.28 (*)    Calcium 8.6 (*)    All other components within normal limits    EKG None  Radiology VAS [place] LOWER EXTREMITY VENOUS (DVT) (ONLY [facility] & [facility])  Result Date: 10/09/2020  Lower Venous DVT Study Patient Name:  AMRIT CRESS  Date of Exam:   10/09/2020 Medical Rec #: [id number]        Accession #:    [id number] Date of Birth: September 06, 1990         Patient Gender: M Patient Age:   57 years Exam Location:  [facility] Procedure:      VAS [place] LOWER EXTREMITY VENOUS (DVT) Referring Phys: Cletis Athens Nylan Nakatani --------------------------------------------------------------------------------  Indications: Swelling.  Risk Factors: COVID 19 positive. Limitations: Body habitus, poor ultrasound/tissue interface and Patient pain tolerance. Comparison Study: No prior studies. Performing Technologist: Chanda Busing RVT  Examination Guidelines: A complete evaluation includes B-mode imaging, spectral Doppler, color Doppler, and power Doppler as needed of all accessible portions of each vessel. Bilateral testing is considered an integral part of a complete examination. Limited examinations for reoccurring indications may be performed as noted. The reflux portion of the exam is performed with the patient in reverse Trendelenburg.  +-----+---------------+---------+-----------+----------+--------------+ RIGHTCompressibilityPhasicitySpontaneityPropertiesThrombus Aging +-----+---------------+---------+-----------+----------+--------------+ CFV  Full           Yes      Yes                                  +-----+---------------+---------+-----------+----------+--------------+   +---------+---------------+---------+-----------+----------+-------------------+ LEFT     CompressibilityPhasicitySpontaneityPropertiesThrombus Aging      +---------+---------------+---------+-----------+----------+-------------------+ CFV      Full           Yes      Yes                                      +---------+---------------+---------+-----------+----------+-------------------+ SFJ      Full                                                             +---------+---------------+---------+-----------+----------+-------------------+ FV Prox  Full                                                             +---------+---------------+---------+-----------+----------+-------------------+ FV Mid   Full                                                             +---------+---------------+---------+-----------+----------+-------------------+  FV Distal               Yes      Yes                                      +---------+---------------+---------+-----------+----------+-------------------+ PFV      Full                                                             +---------+---------------+---------+-----------+----------+-------------------+ POP      Full           Yes      Yes                                      +---------+---------------+---------+-----------+----------+-------------------+ PTV                                                   Not well visualized +---------+---------------+---------+-----------+----------+-------------------+ PERO                                                  Not well visualized +---------+---------------+---------+-----------+----------+-------------------+     Summary: RIGHT: - No evidence of common femoral vein obstruction.  LEFT: - There is no evidence of deep vein thrombosis in the lower extremity. However, portions of this  examination were limited- see technologist comments above.  - No cystic structure found in the popliteal fossa.  *See table(s) above for measurements and observations. Electronically signed by Lemar Livings MD on 10/09/2020 at 5:32:00 PM.    Final     Procedures Procedures   Medications Ordered in ED Medications  potassium chloride SA (KLOR-CON) CR tablet 40 mEq (40 mEq Oral Given 10/09/20 1011)    ED Course  I have reviewed the triage vital signs and the nursing notes.  Pertinent labs & imaging results that were available during my care of the patient were reviewed by me and considered in my medical decision making (see chart for details).  Clinical Course as of 10/10/20 1440  Mon Oct 09, 2020  0913 Doppler study negative for DVT [JK]  0913 Vital signs reviewed.  Erroneous entry for blood pressure 201/175 [JK]    Clinical Course User Index [JK] Linwood Dibbles, MD   MDM Rules/Calculators/A&P                          Labs notable for hypokalemia.  Pt given oral replacement.   Tolerating po.    DVT study negative.  Exam suggestive of cellulitis.  Non toxic.  Will start on oral abx.  Increased risk covid complications.  Will rx paxlovid.  No indication for admission.  Stable for discharge.  Final Clinical Impression(s) / ED Diagnoses Final diagnoses:  Cellulitis of left lower extremity  COVID-19 virus infection  Hypokalemia    Rx / DC  Orders ED Discharge Orders          Ordered    cephALEXin (KEFLEX) 500 MG capsule  4 times daily        10/09/20 1011    ondansetron (ZOFRAN ODT) 4 MG disintegrating tablet  Every 8 hours PRN        10/09/20 1011    nirmatrelvir/ritonavir EUA (PAXLOVID) 20 x 150 MG & 10 x 100MG  TABS  2 times daily       Note to Pharmacy: gfr is 60   10/09/20 1011             10/11/20, MD 10/10/20 1440

## 2020-10-20 ENCOUNTER — Emergency Department (HOSPITAL_COMMUNITY): Payer: BC Managed Care – PPO

## 2020-10-20 ENCOUNTER — Emergency Department (HOSPITAL_COMMUNITY)
Admission: EM | Admit: 2020-10-20 | Discharge: 2020-10-20 | Disposition: A | Discharge status: Home / Self Care | Payer: BC Managed Care – PPO | Attending: Emergency Medicine | Admitting: Emergency Medicine

## 2020-10-20 ENCOUNTER — Encounter (HOSPITAL_COMMUNITY): Payer: Self-pay

## 2020-10-20 ENCOUNTER — Other Ambulatory Visit: Payer: Self-pay

## 2020-10-20 DIAGNOSIS — I10 Essential (primary) hypertension: Secondary | ICD-10-CM | POA: Insufficient documentation

## 2020-10-20 DIAGNOSIS — R600 Localized edema: Secondary | ICD-10-CM | POA: Diagnosis not present

## 2020-10-20 DIAGNOSIS — Z79899 Other long term (current) drug therapy: Secondary | ICD-10-CM | POA: Insufficient documentation

## 2020-10-20 DIAGNOSIS — M7989 Other specified soft tissue disorders: Secondary | ICD-10-CM | POA: Diagnosis present

## 2020-10-20 DIAGNOSIS — F17210 Nicotine dependence, cigarettes, uncomplicated: Secondary | ICD-10-CM | POA: Diagnosis not present

## 2020-10-20 DIAGNOSIS — L03116 Cellulitis of left lower limb: Secondary | ICD-10-CM

## 2020-10-20 LAB — CBC
HCT: 37.5 % — ABNORMAL LOW (ref 39.0–52.0)
Hemoglobin: 11.4 g/dL — ABNORMAL LOW (ref 13.0–17.0)
MCH: 24.8 pg — ABNORMAL LOW (ref 26.0–34.0)
MCHC: 30.4 g/dL (ref 30.0–36.0)
MCV: 81.7 fL (ref 80.0–100.0)
Platelets: 335 10*3/uL (ref 150–400)
RBC: 4.59 MIL/uL (ref 4.22–5.81)
RDW: 16.7 % — ABNORMAL HIGH (ref 11.5–15.5)
WBC: 9.7 10*3/uL (ref 4.0–10.5)
nRBC: 0 % (ref 0.0–0.2)

## 2020-10-20 LAB — BASIC METABOLIC PANEL
Anion gap: 9 (ref 5–15)
BUN: 13 mg/dL (ref 6–20)
CO2: 31 mmol/L (ref 22–32)
Calcium: 8.6 mg/dL — ABNORMAL LOW (ref 8.9–10.3)
Chloride: 100 mmol/L (ref 98–111)
Creatinine, Ser: 1.22 mg/dL (ref 0.61–1.24)
GFR, Estimated: >60 mL/min (ref >60)
Glucose, Bld: 111 mg/dL — ABNORMAL HIGH (ref 70–99)
Potassium: 3.1 mmol/L — ABNORMAL LOW (ref 3.5–5.1)
Sodium: 140 mmol/L (ref 135–145)

## 2020-10-20 LAB — BRAIN NATRIURETIC PEPTIDE: B Natriuretic Peptide: 11.9 pg/mL (ref 0.0–100.0)

## 2020-10-20 MED ORDER — FUROSEMIDE 10 MG/ML IJ SOLN
40.0000 mg | Freq: Once | INTRAMUSCULAR | Status: AC
  Administered 2020-10-20: 40 mg via INTRAVENOUS
  Filled 2020-10-20: qty 4

## 2020-10-20 MED ORDER — AMLODIPINE BESYLATE 5 MG PO TABS
10.0000 mg | ORAL_TABLET | Freq: Once | ORAL | Status: AC
  Administered 2020-10-20: 10 mg via ORAL
  Filled 2020-10-20: qty 2

## 2020-10-20 MED ORDER — CEPHALEXIN 500 MG PO CAPS: 500.0000 mg | ORAL_CAPSULE | Freq: Four times a day (QID) | ORAL | 0 refills | Status: AC

## 2020-10-20 MED ORDER — AMLODIPINE BESYLATE 10 MG PO TABS: 10.0000 mg | ORAL_TABLET | Freq: Every day | ORAL | 3 refills | Status: DC

## 2020-10-20 MED ORDER — IBUPROFEN 800 MG PO TABS: 800.0000 mg | ORAL_TABLET | Freq: Three times a day (TID) | ORAL | 0 refills | Status: DC

## 2020-10-20 MED ORDER — FUROSEMIDE 40 MG PO TABS: 40.0000 mg | ORAL_TABLET | Freq: Every day | ORAL | 3 refills | Status: DC

## 2020-10-20 NOTE — ED Notes (Signed)
This nurse entered the pt's room to start an IV and obtain blood work as well as attach the pt to the cardiac monitor. The pt appeared to be sleeping. Pt was arousable to voice however would fall back asleep when asked his name and date of birth. This nurse stated to the pt that she would need to obtain VS and blood work and to please help her sit himself up in the stretcher. The pt hen aggressively stated to the nurse, "I'm not retarded." This nurse stated to the pt that she did not say that nor did she imply it, the pt then stated, "you're talking to loudly." This nurse went to interject and and the pt "shushed" her. This nurse then exited the room. Dr. Wallace Cullens, ED provider notified and aware.

## 2020-10-20 NOTE — ED Notes (Signed)
Provider back at the bedside to re-assess.

## 2020-10-20 NOTE — ED Notes (Signed)
Provider declines VBG at this time.

## 2020-10-20 NOTE — Discharge Instructions (Addendum)
You have a soft tissue skin infection of the left leg called Cellulitis. This can be painful and worsen your baseline swelling. Please take all of the antibiotic Keflex prescribed and return to ED in the next 3 days if pain/swelling worsens and does not begin to resolve.   Take your lasix as prescribed by pcp to help prevent leg swelling and prevent infections from developing in legs due to swelling and skin breaks.

## 2020-10-20 NOTE — ED Notes (Addendum)
This nurse entered the pt's room and stated to the pt that she would need to start an IV, obtain blood work, and give him his medications. The pt stated to the nurse, while using his personal cell phone, "you still got 'a' attitude." This nurse stated to the pt that she does not have an attitude and asked if he had any questions about the treatment plan. The pt, while still using his personal cell phone stated, "quit talking to me man just do your job." This nurse then stated to the pt that she would be starting an IV, obtaining blood work, giving him PO Norvasc for his blood pressure, and IV lasix to help with fluid retention., as well as attaching him to the cardiac monitor to monitor his VS. This nurse then asked the pt if he had any questions about the treatment plan. The pt then stated to this nurse, "you still got an attitude man stop talking to me and do your job." This nurse then started a 20g peripheral IV and obtained blood work. The pt then stated during his personal phone call, "they ain't telling me nothin' man." This nurse then asked the pt if he had any questions about the treatment plan herself, and the provider had explained to him. This nurse then re-explained to the pt the treatment plan and asked if he had any questions. The pt, continuing to use his personal cellular device, stated, "No." Blood work sent to lab. Pt given urinal and educated on call light usage. ED provider Dr. Wallace Cullens notified and aware of above.

## 2020-10-20 NOTE — ED Notes (Signed)
Xray at the bedside.

## 2020-10-20 NOTE — ED Provider Notes (Signed)
[facility]EMERGENCY DEPT Provider Note   CSN: [id number] Arrival date & time: 10/20/20  5625     History Chief Complaint  Patient presents with   Leg Swelling    Jared Mccoy is a 30 y.o. adult.  30 yo male with PVD, obesity, and HTN presenting for leg swelling. Patient admits to bilateral lower leg swelling that is worsening x 3-4 weeks. States he normal takes lasix but ran out of his medications. Admits to sob. Denies fevers, chills, or coughing.   The history is provided by the patient. No language interpreter was used.  Leg Pain Location:  Leg Time since incident:  4 weeks Injury: no   Leg location:  L leg and R leg Pain details:    Quality:  Aching   Radiates to:  Does not radiate   Progression:  Worsening Associated symptoms: no back pain and no fever       Past Medical History:  Diagnosis Date   Hypertension    not on medication   Obese    Peripheral vascular disease (HCC)    edema in legs     Patient Active Problem List   Diagnosis Date Noted   Multiple trauma 11/10/2016    Past Surgical History:  Procedure Laterality Date   MANDIBULAR HARDWARE REMOVAL N/A 12/27/2016   Procedure: MANDIBULAR HARDWARE REMOVAL;  Surgeon: Newman Pies, MD;  Location: [facility] OR;  Service: ENT;  Laterality: N/A;   OPEN REDUCTION INTERNAL FIXATION (ORIF) DISTAL RADIAL FRACTURE Left 11/10/2016   Procedure: OPEN REDUCTION INTERNAL FIXATION (ORIF) DISTAL RADIAL FRACTURE;  Surgeon: Mack Hook, MD;  Location: [facility] OR;  Service: Orthopedics;  Laterality: Left;   ORIF MANDIBULAR FRACTURE Right 11/10/2016   Procedure: OPEN REDUCTION INTERNAL FIXATION (ORIF) MANDIBULAR FRACTURE, Ear Laceration Repair;  Surgeon: Newman Pies, MD;  Location: [facility] OR;  Service: ENT;  Laterality: Right;     OB History     Gravida  0   Para  0   Term  0   Preterm  0   AB  0   Living         SAB  0   IAB  0   Ectopic  0   Multiple      Live Births              No  family history on file.  Social History   Tobacco Use   Smoking status: Every Day    Packs/day: 0.50    Years: 20.00    Pack years: 10.00    Types: Cigarettes   Smokeless tobacco: Never  Vaping Use   Vaping Use: Never used  Substance Use Topics   Alcohol use: Yes    Comment: 12/26/16- 1 fifth a week   Drug use: No    Home Medications Prior to Admission medications   Medication Sig Start Date End Date Taking? Authorizing Provider  amLODipine (NORVASC) 10 MG tablet Take 1 tablet (10 mg total) by mouth daily. 01/30/20 05/29/20  Minna Antis, MD  cephALEXin (KEFLEX) 500 MG capsule Take 1 capsule by mouth 4  times daily. 10/09/20   Linwood Dibbles, MD  furosemide (LASIX) 40 MG tablet Take 1 tablet (40 mg total) by mouth daily. 02/10/20 06/09/20  Delma Freeze, FNP  ibuprofen (ADVIL,MOTRIN) 200 MG tablet Take 400 mg every 8 (eight) hours as needed by mouth for moderate pain (mouth/jaw pain).     [provider]  losartan (COZAAR) 25 MG tablet Take 1  tablet (25 mg total) by mouth daily. 02/10/20 05/10/20  Delma Freeze, FNP  ondansetron (ZOFRAN ODT) 4 MG disintegrating tablet Allow 1 tablet to dissolve by mouth every 8  hours as needed for nausea or vomiting. 10/09/20   Linwood Dibbles, MD  potassium chloride SA (KLOR-CON) 20 MEQ tablet Take 2 tablets (40 mEq total) by mouth daily for 5 days. 08/02/20 08/07/20  Orvil Feil, PA-C    Allergies    Patient has no known allergies.  Review of Systems   Review of Systems  Constitutional:  Negative for chills and fever.  HENT:  Negative for ear pain and sore throat.   Eyes:  Negative for pain and visual disturbance.  Respiratory:  Positive for shortness of breath. Negative for cough.   Cardiovascular:  Positive for leg swelling. Negative for chest pain and palpitations.  Gastrointestinal:  Negative for abdominal pain and vomiting.  Genitourinary:  Negative for dysuria and hematuria.  Musculoskeletal:  Negative for arthralgias and  back pain.  Skin:  Negative for color change and rash.  Neurological:  Negative for seizures and syncope.  All other systems reviewed and are negative.  Physical Exam Updated Vital Signs BP (!) 209/114 (BP Location: Right Arm)   Pulse 79   Temp 98.4 F (36.9 C) (Oral)   Resp (!) 21   Ht 6' (1.829 m)   Wt (!) 226.8 kg   SpO2 92%   BMI 67.81 kg/m   Physical Exam Vitals and nursing note reviewed.  Constitutional:      Appearance: He is morbidly obese.  HENT:     Head: Normocephalic and atraumatic.  Eyes:     Conjunctiva/sclera: Conjunctivae normal.  Cardiovascular:     Rate and Rhythm: Normal rate and regular rhythm.     Heart sounds: No murmur heard. Pulmonary:     Effort: Pulmonary effort is normal. No respiratory distress.     Breath sounds: Normal breath sounds.  Abdominal:     Palpations: Abdomen is soft.     Tenderness: There is no abdominal tenderness.  Musculoskeletal:     Cervical back: Neck supple.     Right lower leg: 4+ Pitting Edema present.     Left lower leg: 4+ Pitting Edema present.  Skin:    General: Skin is warm and dry.  Neurological:     Mental Status: He is alert.    ED Results / Procedures / Treatments   Labs (all labs ordered are listed, but only abnormal results are displayed) Labs Reviewed - No data to display  EKG None  Radiology No results found.  Procedures Procedures   Medications Ordered in ED Medications - No data to display  ED Course  I have reviewed the triage vital signs and the nursing notes.  Pertinent labs & imaging results that were available during my care of the patient were reviewed by me and considered in my medical decision making (see chart for details).    MDM Rules/Calculators/A&P                          7:24 AM 30 yo male with PVD, obesity, and HTN presenting for leg swelling. Pt is Aox3,no acute distress, afebrile, with stable vitals. No hypoxia. Physical exam demonstrates clear breath sounds and  bilateral lower extremity pitting edema +4. Pt off home lasix for several weeks due to running out of prescription. IV lasix 40 mg given.   Stable bnp and cxr. No  pleural effusions.  Pt also hypertensive but has been out of home Norvasc. Home dose given in ED and sent to pharmacy.  Patient in no distress and overall condition improved here in the ED. New prescription for lasix sent. Pt recommended for compression stockings and follow up with vascular. Detailed discussions were had with the patient regarding current findings, and need for close f/u with PCP or on call doctor. The patient has been instructed to return immediately if the symptoms worsen in any way for re-evaluation. Patient verbalized understanding and is in agreement with current care plan. All questions answered prior to discharge.        Final Clinical Impression(s) / ED Diagnoses Final diagnoses:  Leg swelling    Rx / DC Orders ED Discharge Orders     None        Franne Forts, DO 10/20/20 [phone]

## 2020-10-20 NOTE — ED Triage Notes (Signed)
Pt reports bilateral leg swelling x 4 days. Has not been taking prescribed antihypertensives or diuretics.

## 2020-11-29 ENCOUNTER — Ambulatory Visit: Payer: Self-pay | Admitting: Nurse Practitioner

## 2021-01-21 ENCOUNTER — Other Ambulatory Visit: Payer: Self-pay | Admitting: Family

## 2021-01-22 ENCOUNTER — Other Ambulatory Visit: Payer: Self-pay

## 2021-01-22 ENCOUNTER — Emergency Department (HOSPITAL_COMMUNITY): Payer: Self-pay

## 2021-01-22 ENCOUNTER — Encounter (HOSPITAL_COMMUNITY): Payer: Self-pay

## 2021-01-22 ENCOUNTER — Emergency Department (HOSPITAL_COMMUNITY)
Admission: EM | Admit: 2021-01-22 | Discharge: 2021-01-22 | Disposition: A | Discharge status: Home / Self Care | Payer: Self-pay | Attending: Emergency Medicine | Admitting: Emergency Medicine

## 2021-01-22 DIAGNOSIS — E876 Hypokalemia: Secondary | ICD-10-CM | POA: Insufficient documentation

## 2021-01-22 DIAGNOSIS — F17210 Nicotine dependence, cigarettes, uncomplicated: Secondary | ICD-10-CM | POA: Insufficient documentation

## 2021-01-22 DIAGNOSIS — G4489 Other headache syndrome: Secondary | ICD-10-CM

## 2021-01-22 DIAGNOSIS — Z79899 Other long term (current) drug therapy: Secondary | ICD-10-CM | POA: Insufficient documentation

## 2021-01-22 DIAGNOSIS — I10 Essential (primary) hypertension: Secondary | ICD-10-CM | POA: Insufficient documentation

## 2021-01-22 LAB — I-STAT CHEM 8, ED
BUN: 11 mg/dL (ref 6–20)
Calcium, Ion: 1.14 mmol/L — ABNORMAL LOW (ref 1.15–1.40)
Chloride: 97 mmol/L — ABNORMAL LOW (ref 98–111)
Creatinine, Ser: 1.1 mg/dL (ref 0.61–1.24)
Glucose, Bld: 140 mg/dL — ABNORMAL HIGH (ref 70–99)
HCT: 39 % (ref 39.0–52.0)
Hemoglobin: 13.3 g/dL (ref 13.0–17.0)
Potassium: 2.5 mmol/L — CL (ref 3.5–5.1)
Sodium: 138 mmol/L (ref 135–145)
TCO2: 30 mmol/L (ref 22–32)

## 2021-01-22 MED ORDER — AMLODIPINE BESYLATE 5 MG PO TABS
10.0000 mg | ORAL_TABLET | Freq: Once | ORAL | Status: AC
  Administered 2021-01-22: 10 mg via ORAL
  Filled 2021-01-22: qty 2

## 2021-01-22 MED ORDER — LOSARTAN POTASSIUM 25 MG PO TABS: 25.0000 mg | ORAL_TABLET | Freq: Every day | ORAL | 0 refills | Status: DC

## 2021-01-22 MED ORDER — KETOROLAC TROMETHAMINE 30 MG/ML IJ SOLN
60.0000 mg | Freq: Once | INTRAMUSCULAR | Status: AC
  Administered 2021-01-22: 60 mg via INTRAMUSCULAR
  Filled 2021-01-22: qty 2

## 2021-01-22 MED ORDER — POTASSIUM CHLORIDE 10 MEQ/100ML IV SOLN
10.0000 meq | INTRAVENOUS | Status: AC
Start: 2021-01-22 — End: 2021-01-22
  Administered 2021-01-22 (×2): 10 meq via INTRAVENOUS
  Filled 2021-01-22 (×2): qty 100

## 2021-01-22 MED ORDER — POTASSIUM CHLORIDE CRYS ER 20 MEQ PO TBCR
40.0000 meq | EXTENDED_RELEASE_TABLET | Freq: Once | ORAL | Status: AC
  Administered 2021-01-22: 40 meq via ORAL
  Filled 2021-01-22: qty 2

## 2021-01-22 MED ORDER — AMLODIPINE BESYLATE 5 MG PO TABS: 5.0000 mg | ORAL_TABLET | Freq: Every day | ORAL | 0 refills | Status: DC

## 2021-01-22 MED ORDER — HYDROCODONE-ACETAMINOPHEN 5-325 MG PO TABS
1.0000 | ORAL_TABLET | Freq: Once | ORAL | Status: AC
  Administered 2021-01-22: 1 via ORAL
  Filled 2021-01-22: qty 1

## 2021-01-22 MED ORDER — POTASSIUM CHLORIDE CRYS ER 20 MEQ PO TBCR: 20.0000 meq | EXTENDED_RELEASE_TABLET | Freq: Every day | ORAL | 0 refills | Status: DC

## 2021-01-22 NOTE — ED Triage Notes (Signed)
Pt reports with HTN and headache since yesterday. Pt states that he has been out of his BP medications x 2 weeks.

## 2021-01-22 NOTE — ED Provider Notes (Signed)
[facility] DEPT Provider Note   CSN: [id number] Arrival date & time: 01/22/21  [id number]     History Chief Complaint  Patient presents with   Hypertension   Headache    Jared Mccoy is a 30 y.o. adult.  The history is provided by the patient.  Hypertension This is a chronic problem. The current episode started more than 2 days ago. The problem occurs daily. Associated symptoms include headaches and shortness of breath. Pertinent negatives include no chest pain. Nothing aggravates the symptoms. Nothing relieves the symptoms.  Headache Associated symptoms: cough   Associated symptoms: no fever, no vomiting and no weakness   Patient history of hypertension, obesity presents with high blood pressure and headache.  Patient reports he has been out of his blood pressure medicines for over a week.  He reports when he checks his blood pressure at home has been significantly elevated.  He called EMS yesterday to have it checked but did not end up going to the hospital.  Tonight he woke up with a headache and continued high blood pressure.  No fevers or vomiting.  No focal weakness.  No visual changes tonight.    Past Medical History:  Diagnosis Date   Hypertension    not on medication   Obese    Peripheral vascular disease ([place])    edema in legs     Patient Active Problem List   Diagnosis Date Noted   Multiple trauma 11/10/2016    Past Surgical History:  Procedure Laterality Date   MANDIBULAR HARDWARE REMOVAL N/A 12/27/2016   Procedure: MANDIBULAR HARDWARE REMOVAL;  Surgeon: Leta Baptist, MD;  Location: [facility];  Service: ENT;  Laterality: N/A;   OPEN REDUCTION INTERNAL FIXATION (ORIF) DISTAL RADIAL FRACTURE Left 11/10/2016   Procedure: OPEN REDUCTION INTERNAL FIXATION (ORIF) DISTAL RADIAL FRACTURE;  Surgeon: Milly Jakob, MD;  Location: [facility];  Service: Orthopedics;  Laterality: Left;   ORIF MANDIBULAR FRACTURE Right 11/10/2016   Procedure: OPEN  REDUCTION INTERNAL FIXATION (ORIF) MANDIBULAR FRACTURE, Ear Laceration Repair;  Surgeon: Leta Baptist, MD;  Location: [facility];  Service: ENT;  Laterality: Right;     OB History     Gravida  0   Para  0   Term  0   Preterm  0   AB  0   Living         SAB  0   IAB  0   Ectopic  0   Multiple      Live Births              History reviewed. No pertinent family history.  Social History   Tobacco Use   Smoking status: Every Day    Packs/day: 0.50    Years: 20.00    Pack years: 10.00    Types: Cigarettes   Smokeless tobacco: Never  Vaping Use   Vaping Use: Never used  Substance Use Topics   Alcohol use: Yes    Comment: 12/26/16- 1 fifth a week   Drug use: No    Home Medications Prior to Admission medications   Medication Sig Start Date End Date Taking? Authorizing Provider  amLODipine (NORVASC) 5 MG tablet Take 1 tablet (5 mg total) by mouth daily. 01/22/21  Yes Ripley Fraise, MD  losartan (COZAAR) 25 MG tablet Take 1 tablet (25 mg total) by mouth daily. 01/22/21  Yes Ripley Fraise, MD  potassium chloride SA (KLOR-CON M) 20 MEQ tablet Take 1 tablet (20 mEq total)  by mouth daily. 01/22/21  Yes Ripley Fraise, MD  furosemide (LASIX) 40 MG tablet Take 1 tablet (40 mg total) by mouth daily. [id number]  Campbell Stall P, DO  ibuprofen (ADVIL) 800 MG tablet Take 1 tablet (800 mg total) by mouth 3 (three) times daily. [id number]   Lianne Cure, DO    Allergies    Patient has no known allergies.  Review of Systems   Review of Systems  Constitutional:  Negative for fever.  Eyes:  Negative for visual disturbance.  Respiratory:  Positive for cough and shortness of breath.   Cardiovascular:  Negative for chest pain.  Gastrointestinal:  Negative for vomiting.  Neurological:  Positive for headaches. Negative for weakness.  All other systems reviewed and are negative.  Physical Exam Updated Vital Signs BP 129/88   Pulse 86   Temp (!) 97.4 F (36.3 C) (Oral)    Resp 17   Ht 1.829 m (6')   Wt (!) 181.4 kg   SpO2 98%   BMI 54.25 kg/m   Physical Exam CONSTITUTIONAL: Well developed/well nourished HEAD: Normocephalic/atraumatic EYES: EOMI/PERRL, no nystagmus, no ptosis,  ENMT: Mucous membranes moist NECK: supple no meningeal signs SPINE/BACK:entire spine nontender CV: S1/S2 noted, no murmurs/rubs/gallops noted LUNGS: Scattered wheeze bilaterally, no acute distress ABDOMEN: soft, nontender, obese GU:no cva tenderness NEURO:Awake/alert, face symmetric, no arm or leg drift is noted Equal 5/5 strength with shoulder abduction, elbow flex/extension Equal 5/5 strength with hip flexion,knee flex/extension, foot dorsi/plantar flexion Cranial nerves 3/4/5/6/08/19/08/11/12 tested and intact Gait normal without ataxia EXTREMITIES: pulses normal, full ROM, chronic 4+ pitting edema to bilateral lower extremities, no significant erythema noted SKIN: warm, color normal PSYCH: no abnormalities of mood noted, alert and oriented to situation   ED Results / Procedures / Treatments   Labs (all labs ordered are listed, but only abnormal results are displayed) Labs Reviewed  I-STAT CHEM 8, ED - Abnormal; Notable for the following components:      Result Value   Potassium 2.5 (*)    Chloride 97 (*)    Glucose, Bld 140 (*)    Calcium, Ion 1.14 (*)    All other components within normal limits    EKG EKG Interpretation  Date/Time:  Monday January 22 2021 06:04:11 EST Ventricular Rate:  85 PR Interval:  142 QRS Duration: 101 QT Interval:  494 QTC Calculation: 588 R Axis:   75 Text Interpretation: Sinus rhythm Consider right atrial enlargement Borderline repolarization abnormality Prolonged QT interval Confirmed by Ripley Fraise [phone]) on 01/22/2021 6:13:59 AM  Radiology DG Chest 2 View  Result Date: 01/22/2021 CLINICAL DATA:  30 year old male with shortness of breath, headache and hypertension. Ran out of blood pressure medication 2 weeks ago.  EXAM: CHEST - 2 VIEW COMPARISON:  Chest radiographs 10/20/2020 and earlier. FINDINGS: PA and lateral views today. Lung volumes and mediastinal contours within normal limits. Visualized tracheal air column is within normal limits. Both lungs appear clear. No pneumothorax or pleural effusion. No osseous abnormality identified. Negative visible bowel gas. IMPRESSION: Negative.  No acute cardiopulmonary abnormality. Electronically Signed   By: Genevie Ann M.D.   On: 01/22/2021 06:29    Procedures Procedures   Medications Ordered in ED Medications  potassium chloride 10 mEq in 100 mL IVPB (has no administration in time range)  amLODipine (NORVASC) tablet 10 mg (10 mg Oral Given 01/22/21 0642)  ketorolac (TORADOL) 30 MG/ML injection 60 mg (60 mg Intramuscular Given 01/22/21 0637)  HYDROcodone-acetaminophen (NORCO/VICODIN) 5-325 MG per tablet 1  tablet (1 tablet Oral Given 01/22/21 0644)  potassium chloride SA (KLOR-CON M) CR tablet 40 mEq (40 mEq Oral Given 01/22/21 0645)    ED Course  I have reviewed the triage vital signs and the nursing notes.  Pertinent labs & imaging results that were available during my care of the patient were reviewed by me and considered in my medical decision making (see chart for details).    MDM Rules/Calculators/A&P                           Patient presents with elevated blood pressure likely due to running out of his medicines as well as a headache.  He reports the headache is been gradual, no focal weakness and denies previous history of stroke.  Suspect this is been triggered by his hypertension.  We will treat his headache, check electrolyte panel and reassess Due to his shortness of breath, will add on chest x-ray and EKG 6:57 AM Patient was found to be hypokalemic.  This is likely the cause of his changes in his EKG.  I personally reviewed his x-ray and it is negative.  Patient is now resting comfortably his blood pressure is improved BP 129/88   Pulse 86    Temp (!) 97.4 F (36.3 C) (Oral)   Resp 17   Ht 1.829 m (6')   Wt (!) 181.4 kg   SpO2 98%   BMI 54.25 kg/m  Patient reports headache is resolving.  I have low suspicion for acute stroke or other acute neurologic emergency at this time  Plan will be to give patient potassium here in the emerged department and then discharged home.  I will restart his home blood pressure medicines except for Lasix. He has been given referrals to her primary care physician.  Final Clinical Impression(s) / ED Diagnoses Final diagnoses:  Primary hypertension  Other headache syndrome  Hypokalemia    Rx / DC Orders ED Discharge Orders          Ordered    amLODipine (NORVASC) 5 MG tablet  Daily        01/22/21 0655    losartan (COZAAR) 25 MG tablet  Daily        01/22/21 0655    potassium chloride SA (KLOR-CON M) 20 MEQ tablet  Daily        01/22/21 0657             Zadie Rhine, MD 01/22/21 [phone]

## 2021-01-22 NOTE — Progress Notes (Signed)
.  Transition of Care [facility]) - Emergency Department Mini Assessment   Patient Details  Name: NAPHTALI ZYWICKI MRN: [id number] Date of Birth: May 19, 1990  Transition of Care [facility]) CM/SW Contact:    Larrie Kass, LCSW Phone Number: 01/22/2021, 9:21 AM   Clinical Narrative: CSW attached pt's follow up appointment his AVS , [facility] at 1:30pm.   ED Mini Assessment: What brought you to the Emergency Department? : hypertension  Barriers to Discharge: No Barriers Identified        Interventions which prevented an admission or readmission: Medication Review, Follow-up medical appointment    Patient Contact and Communications        ,                 Admission diagnosis:  migraine, high blood pressure Patient Active Problem List   Diagnosis Date Noted   Multiple trauma 11/10/2016   PCP:  Pcp, No Pharmacy:   [id number] - [place], [place] - [address] AT [facility] OF [address] [place] [address] Phone: [phone] Fax: [phone]  [facility] [address] ([address]),  - [address] [place]) [place] [address] Phone: [phone] Fax: [phone]  [facility] [address] [place] [address] Phone: [phone] Fax: [phone]  [id number] - [address] [place]), [place] - [address] [place]) [place] [address] Phone: [phone] Fax: [phone]

## 2021-02-06 ENCOUNTER — Encounter (HOSPITAL_COMMUNITY): Payer: Self-pay | Admitting: Emergency Medicine

## 2021-02-06 ENCOUNTER — Emergency Department (HOSPITAL_COMMUNITY)
Admission: EM | Admit: 2021-02-06 | Discharge: 2021-02-06 | Disposition: A | Discharge status: Home / Self Care | Payer: Self-pay | Attending: Emergency Medicine | Admitting: Emergency Medicine

## 2021-02-06 DIAGNOSIS — I10 Essential (primary) hypertension: Secondary | ICD-10-CM | POA: Insufficient documentation

## 2021-02-06 DIAGNOSIS — Z79899 Other long term (current) drug therapy: Secondary | ICD-10-CM | POA: Insufficient documentation

## 2021-02-06 DIAGNOSIS — F17210 Nicotine dependence, cigarettes, uncomplicated: Secondary | ICD-10-CM | POA: Insufficient documentation

## 2021-02-06 DIAGNOSIS — Z202 Contact with and (suspected) exposure to infections with a predominantly sexual mode of transmission: Secondary | ICD-10-CM | POA: Insufficient documentation

## 2021-02-06 LAB — URINALYSIS, COMPLETE (UACMP) WITH MICROSCOPIC
Bacteria, UA: NONE SEEN
Bilirubin Urine: NEGATIVE
Glucose, UA: NEGATIVE mg/dL
Hgb urine dipstick: NEGATIVE
Ketones, ur: NEGATIVE mg/dL
Nitrite: NEGATIVE
Protein, ur: 100 mg/dL — AB
Specific Gravity, Urine: 1.023 (ref 1.005–1.030)
pH: 6 (ref 5.0–8.0)

## 2021-02-06 LAB — CBC WITH DIFFERENTIAL/PLATELET
Abs Immature Granulocytes: 0.02 10*3/uL (ref 0.00–0.07)
Basophils Absolute: 0 10*3/uL (ref 0.0–0.1)
Basophils Relative: 0 %
Eosinophils Absolute: 0.2 10*3/uL (ref 0.0–0.5)
Eosinophils Relative: 3 %
HCT: 39.4 % (ref 39.0–52.0)
Hemoglobin: 12.2 g/dL — ABNORMAL LOW (ref 13.0–17.0)
Immature Granulocytes: 0 %
Lymphocytes Relative: 33 %
Lymphs Abs: 2.3 10*3/uL (ref 0.7–4.0)
MCH: 24.8 pg — ABNORMAL LOW (ref 26.0–34.0)
MCHC: 31 g/dL (ref 30.0–36.0)
MCV: 80.2 fL (ref 80.0–100.0)
Monocytes Absolute: 0.4 10*3/uL (ref 0.1–1.0)
Monocytes Relative: 6 %
Neutro Abs: 3.9 10*3/uL (ref 1.7–7.7)
Neutrophils Relative %: 58 %
Platelets: 244 10*3/uL (ref 150–400)
RBC: 4.91 MIL/uL (ref 4.22–5.81)
RDW: 17.5 % — ABNORMAL HIGH (ref 11.5–15.5)
WBC: 6.9 10*3/uL (ref 4.0–10.5)
nRBC: 0 % (ref 0.0–0.2)

## 2021-02-06 LAB — COMPREHENSIVE METABOLIC PANEL
ALT: 21 U/L (ref 0–44)
AST: 25 U/L (ref 15–41)
Albumin: 3.9 g/dL (ref 3.5–5.0)
Alkaline Phosphatase: 66 U/L (ref 38–126)
Anion gap: 5 (ref 5–15)
BUN: 15 mg/dL (ref 6–20)
CO2: 29 mmol/L (ref 22–32)
Calcium: 8.8 mg/dL — ABNORMAL LOW (ref 8.9–10.3)
Chloride: 104 mmol/L (ref 98–111)
Creatinine, Ser: 1.12 mg/dL (ref 0.61–1.24)
GFR, Estimated: >60 mL/min (ref >60)
Glucose, Bld: 122 mg/dL — ABNORMAL HIGH (ref 70–99)
Potassium: 3 mmol/L — ABNORMAL LOW (ref 3.5–5.1)
Sodium: 138 mmol/L (ref 135–145)
Total Bilirubin: 0.5 mg/dL (ref 0.3–1.2)
Total Protein: 7.5 g/dL (ref 6.5–8.1)

## 2021-02-06 LAB — RAPID HIV SCREEN (HIV 1/2 AB+AG)
HIV 1/2 Antibodies: NONREACTIVE
HIV-1 P24 Antigen - HIV24: NONREACTIVE

## 2021-02-06 MED ORDER — POTASSIUM CHLORIDE CRYS ER 20 MEQ PO TBCR
20.0000 meq | EXTENDED_RELEASE_TABLET | Freq: Once | ORAL | Status: AC
  Administered 2021-02-06: 20:00:00 20 meq via ORAL
  Filled 2021-02-06: qty 1

## 2021-02-06 MED ORDER — MAGNESIUM OXIDE -MG SUPPLEMENT 400 (240 MG) MG PO TABS
800.0000 mg | ORAL_TABLET | Freq: Once | ORAL | Status: AC
Start: 2021-02-06 — End: 2021-02-06
  Administered 2021-02-06: 20:00:00 800 mg via ORAL
  Filled 2021-02-06: qty 2

## 2021-02-06 MED ORDER — LIDOCAINE HCL (PF) 1 % IJ SOLN
2.1000 mL | Freq: Once | INTRAMUSCULAR | Status: AC
  Administered 2021-02-06: 20:00:00 2.1 mL
  Filled 2021-02-06: qty 30

## 2021-02-06 MED ORDER — METRONIDAZOLE 500 MG PO TABS: 500.0000 mg | ORAL_TABLET | Freq: Two times a day (BID) | ORAL | 0 refills | Status: DC

## 2021-02-06 MED ORDER — DOXYCYCLINE HYCLATE 100 MG PO TABS: 100.0000 mg | ORAL_TABLET | Freq: Two times a day (BID) | ORAL | 0 refills | Status: DC

## 2021-02-06 MED ORDER — AMLODIPINE BESYLATE 5 MG PO TABS
5.0000 mg | ORAL_TABLET | Freq: Once | ORAL | Status: AC
  Administered 2021-02-06: 18:00:00 5 mg via ORAL
  Filled 2021-02-06: qty 1

## 2021-02-06 MED ORDER — POTASSIUM CHLORIDE CRYS ER 20 MEQ PO TBCR: 20.0000 meq | EXTENDED_RELEASE_TABLET | Freq: Every day | ORAL | 0 refills | Status: DC

## 2021-02-06 MED ORDER — CEFTRIAXONE SODIUM 1 G IJ SOLR
1.0000 g | Freq: Once | INTRAMUSCULAR | Status: AC
  Administered 2021-02-06: 20:00:00 1 g via INTRAMUSCULAR
  Filled 2021-02-06: qty 10

## 2021-02-06 MED ORDER — POTASSIUM CHLORIDE CRYS ER 20 MEQ PO TBCR
20.0000 meq | EXTENDED_RELEASE_TABLET | Freq: Once | ORAL | Status: AC
  Administered 2021-02-06: 18:00:00 20 meq via ORAL
  Filled 2021-02-06: qty 1

## 2021-02-06 MED ORDER — LOSARTAN POTASSIUM 25 MG PO TABS
25.0000 mg | ORAL_TABLET | Freq: Once | ORAL | Status: AC
  Administered 2021-02-06: 18:00:00 25 mg via ORAL

## 2021-02-06 MED ORDER — FUROSEMIDE 40 MG PO TABS: 40.0000 mg | ORAL_TABLET | Freq: Every day | ORAL | 3 refills | Status: DC

## 2021-02-06 MED ORDER — FUROSEMIDE 40 MG PO TABS
40.0000 mg | ORAL_TABLET | Freq: Once | ORAL | Status: AC
  Administered 2021-02-06: 18:00:00 40 mg via ORAL
  Filled 2021-02-06: qty 1

## 2021-02-06 NOTE — ED Triage Notes (Addendum)
Patient here from home reporting exposure to Trich. Requesting check. States that he does not take BP meds although prescribed.

## 2021-02-06 NOTE — ED Provider Notes (Signed)
[facility] DEPT Provider Note   CSN: [id number] Arrival date & time: 02/06/21  1643     History Chief Complaint  Patient presents with   Exposure to STD    HAWKEN BARTHOLF is a 30 y.o. adult.   Exposure to STD Pertinent negatives include no chest pain, no abdominal pain, no headaches and no shortness of breath. Patient presents for exposure to STD.  He had a previous sexual partner informed him that she had been diagnosed with trichomonas.  He was advised to get tested/treated.  He reports that the most recent sexual encounter with this person was 2 weeks ago.  He denies any recent discharge, skin lesions, itchiness, or dysuria.  He has a history of hypertension.  He is on amlodipine, Cozaar, and Lasix.  He reports that he has not been taking these medications.  He has been unable to refill his Lasix prescription.  He denies any current shortness of breath, chest discomfort, headache, neurologic symptoms, or abdominal pain.     Past Medical History:  Diagnosis Date   Hypertension    not on medication   Obese    Peripheral vascular disease ([place])    edema in legs     Patient Active Problem List   Diagnosis Date Noted   Multiple trauma 11/10/2016    Past Surgical History:  Procedure Laterality Date   MANDIBULAR HARDWARE REMOVAL N/A 12/27/2016   Procedure: MANDIBULAR HARDWARE REMOVAL;  Surgeon: Leta Baptist, MD;  Location: [facility];  Service: ENT;  Laterality: N/A;   OPEN REDUCTION INTERNAL FIXATION (ORIF) DISTAL RADIAL FRACTURE Left 11/10/2016   Procedure: OPEN REDUCTION INTERNAL FIXATION (ORIF) DISTAL RADIAL FRACTURE;  Surgeon: Milly Jakob, MD;  Location: [facility];  Service: Orthopedics;  Laterality: Left;   ORIF MANDIBULAR FRACTURE Right 11/10/2016   Procedure: OPEN REDUCTION INTERNAL FIXATION (ORIF) MANDIBULAR FRACTURE, Ear Laceration Repair;  Surgeon: Leta Baptist, MD;  Location: [facility];  Service: ENT;  Laterality: Right;     OB History     Gravida   0   Para  0   Term  0   Preterm  0   AB  0   Living         SAB  0   IAB  0   Ectopic  0   Multiple      Live Births              History reviewed. No pertinent family history.  Social History   Tobacco Use   Smoking status: Every Day    Packs/day: 0.50    Years: 20.00    Pack years: 10.00    Types: Cigarettes   Smokeless tobacco: Never  Vaping Use   Vaping Use: Never used  Substance Use Topics   Alcohol use: Yes    Comment: 12/26/16- 1 fifth a week   Drug use: No    Home Medications Prior to Admission medications   Medication Sig Start Date End Date Taking? Authorizing Provider  metroNIDAZOLE (FLAGYL) 500 MG tablet Take 1 tablet (500 mg total) by mouth 2 (two) times daily. 02/06/21  Yes Godfrey Pick, MD  amLODipine (NORVASC) 5 MG tablet Take 1 tablet (5 mg total) by mouth daily. 01/22/21   Ripley Fraise, MD  doxycycline (VIBRA-TABS) 100 MG tablet Take 1 tablet (100 mg total) by mouth 2 (two) times daily. 02/06/21   Godfrey Pick, MD  furosemide (LASIX) 40 MG tablet Take 1 tablet (40 mg total) by mouth daily.  02/06/21 06/06/21  Gloris Manchester, MD  ibuprofen (ADVIL) 800 MG tablet Take 1 tablet (800 mg total) by mouth 3 (three) times daily. 10/20/20   Edwin Dada P, DO  losartan (COZAAR) 25 MG tablet Take 1 tablet (25 mg total) by mouth daily. 01/22/21   Zadie Rhine, MD  potassium chloride SA (KLOR-CON M) 20 MEQ tablet Take 1 tablet (20 mEq total) by mouth daily. 02/06/21   Gloris Manchester, MD    Allergies    Patient has no known allergies.  Review of Systems   Review of Systems  Constitutional:  Negative for activity change, appetite change, chills, fatigue and fever.  HENT:  Negative for ear pain and sore throat.   Eyes:  Negative for pain and visual disturbance.  Respiratory:  Negative for cough, chest tightness and shortness of breath.   Cardiovascular:  Negative for chest pain and palpitations.  Gastrointestinal:  Negative for abdominal pain,  diarrhea, nausea and vomiting.  Genitourinary:  Negative for dysuria, flank pain, hematuria, penile discharge, penile pain, penile swelling, scrotal swelling and testicular pain.  Musculoskeletal:  Negative for arthralgias, back pain, myalgias and neck pain.  Skin:  Negative for color change and rash.  Neurological:  Negative for dizziness, seizures, syncope, weakness, light-headedness, numbness and headaches.  Hematological:  Does not bruise/bleed easily.  Psychiatric/Behavioral:  Negative for confusion and decreased concentration.   All other systems reviewed and are negative.  Physical Exam Updated Vital Signs BP (!) 201/132    Pulse 83    Temp 99.1 F (37.3 C) (Oral)    Resp 18    SpO2 97%   Physical Exam Vitals and nursing note reviewed.  Constitutional:      General: He is not in acute distress.    Appearance: Normal appearance. He is well-developed. He is not ill-appearing, toxic-appearing or diaphoretic.  HENT:     Head: Normocephalic and atraumatic.     Right Ear: External ear normal.     Left Ear: External ear normal.  Eyes:     Conjunctiva/sclera: Conjunctivae normal.  Cardiovascular:     Rate and Rhythm: Normal rate and regular rhythm.     Heart sounds: No murmur heard. Pulmonary:     Effort: Pulmonary effort is normal. No respiratory distress.  Abdominal:     Palpations: Abdomen is soft.     Tenderness: There is no abdominal tenderness.  Musculoskeletal:        General: No swelling. Normal range of motion.     Cervical back: Normal range of motion and neck supple.  Skin:    General: Skin is warm and dry.     Capillary Refill: Capillary refill takes less than 2 seconds.  Neurological:     General: No focal deficit present.     Mental Status: He is alert and oriented to person, place, and time.     Cranial Nerves: No cranial nerve deficit.     Sensory: No sensory deficit.     Motor: No weakness.     Coordination: Coordination normal.  Psychiatric:        Mood  and Affect: Mood normal.        Behavior: Behavior normal.        Thought Content: Thought content normal.        Judgment: Judgment normal.    ED Results / Procedures / Treatments   Labs (all labs ordered are listed, but only abnormal results are displayed) Labs Reviewed  URINALYSIS, COMPLETE (UACMP) WITH MICROSCOPIC - Abnormal;  Notable for the following components:      Result Value   APPearance HAZY (*)    Protein, ur 100 (*)    Leukocytes,Ua MODERATE (*)    All other components within normal limits  CBC WITH DIFFERENTIAL/PLATELET - Abnormal; Notable for the following components:   Hemoglobin 12.2 (*)    MCH 24.8 (*)    RDW 17.5 (*)    All other components within normal limits  COMPREHENSIVE METABOLIC PANEL - Abnormal; Notable for the following components:   Potassium 3.0 (*)    Glucose, Bld 122 (*)    Calcium 8.8 (*)    All other components within normal limits  WET PREP, GENITAL  RAPID HIV SCREEN (HIV 1/2 AB+AG)  RPR  GC/CHLAMYDIA PROBE AMP ([place]) NOT AT [facility]    EKG None  Radiology No results found.  Procedures Procedures   Medications Ordered in ED Medications  losartan (COZAAR) tablet 25 mg (25 mg Oral Given 02/06/21 1748)  amLODipine (NORVASC) tablet 5 mg (5 mg Oral Given 02/06/21 1738)  potassium chloride SA (KLOR-CON M) CR tablet 20 mEq (20 mEq Oral Given 02/06/21 1751)  furosemide (LASIX) tablet 40 mg (40 mg Oral Given 02/06/21 1750)  potassium chloride SA (KLOR-CON M) CR tablet 20 mEq (20 mEq Oral Given 02/06/21 1936)  magnesium oxide (MAG-OX) tablet 800 mg (800 mg Oral Given 02/06/21 1936)  cefTRIAXone (ROCEPHIN) injection 1 g (1 g Intramuscular Given 02/06/21 2011)  lidocaine (PF) (XYLOCAINE) 1 % injection 2.1 mL (2.1 mLs Other Given 02/06/21 2011)    ED Course  I have reviewed the triage vital signs and the nursing notes.  Pertinent labs & imaging results that were available during my care of the patient were reviewed by me and considered  in my medical decision making (see chart for details).    MDM Rules/Calculators/A&P                         Patient presents for recent exposure to STI.  He denies any recent symptoms.  On arrival in the ED, patient's blood pressure is markedly elevated.  He has a history of hypertension and has not been taking his prescribed medications.  Home doses of amlodipine and Cozaar were provided.  Additionally, patient requested dose of his home Lasix which was given, in addition to potassium supplementation.  Labs were obtained which did show hypokalemia.  Additional potassium was given.  He requested complete STI work-up, including RPR and HIV testing.  Urinalysis did show pyuria without bacteria, suggestive of STI.  He was advised that further results of STI testing will not be available for 1 to 2 days.  He was advised to follow-up with his primary care doctor, not only for results of STI testing, but also for management of his chronic illnesses.  Patient will require repeat lab work, given his Lasix use.  Patient will benefit from adherence to his prescribed blood pressure medications and would benefit from PCP follow-up to ensure that he is on the right medications at the right doses.  Work-up shows no evidence of endorgan damage.  Patient did request empiric treatment for STIs.  This was provided.  Patient was discharged in stable condition.  Final Clinical Impression(s) / ED Diagnoses Final diagnoses:  STD exposure    Rx / DC Orders ED Discharge Orders          Ordered    doxycycline (VIBRA-TABS) 100 MG tablet  2 times daily,  Status:  Discontinued        02/06/21 1954    doxycycline (VIBRA-TABS) 100 MG tablet  2 times daily        02/06/21 1955    metroNIDAZOLE (FLAGYL) 500 MG tablet  2 times daily        02/06/21 1955    furosemide (LASIX) 40 MG tablet  Daily       Note to Pharmacy: D/C lasix 20mg  tablet   02/06/21 1955    potassium chloride SA (KLOR-CON M) 20 MEQ tablet  Daily         02/06/21 1955             Godfrey Pick, MD 02/07/21 0140

## 2021-02-06 NOTE — Discharge Instructions (Signed)
Take your blood pressure medications as prescribed.  There was a new prescription for your Lasix that was sent to your pharmacy.  Be aware that this can cause drops in your potassium and magnesium.  Today in the emergency department, your potassium was low and we replaced it.  This is something you should follow-up with a primary care doctor so he/she can recheck your labs.  A prescription sent to your pharmacy for treatment of sexually-transmitted infections as well.  1 of these medications (Flagyl) has an interaction with alcohol that would make you feel terrible.  Do not drink alcohol while on this medication.  Follow-up with your primary care doctor regarding results of STD testing.  These should be available within the next 1 to 2 days.  If test results are negative, you may be able to discontinue the antibiotics.  Avoid sexual contact until you are fully treated.

## 2021-02-07 LAB — RPR: RPR Ser Ql: NONREACTIVE

## 2021-02-14 ENCOUNTER — Ambulatory Visit (INDEPENDENT_AMBULATORY_CARE_PROVIDER_SITE_OTHER): Payer: Self-pay | Admitting: Primary Care

## 2021-02-14 ENCOUNTER — Encounter (INDEPENDENT_AMBULATORY_CARE_PROVIDER_SITE_OTHER): Payer: Self-pay | Admitting: Primary Care

## 2021-02-14 ENCOUNTER — Other Ambulatory Visit: Payer: Self-pay

## 2021-02-14 VITALS — BP 159/100 | HR 82 | Temp 97.5°F | Ht 72.0 in | Wt >= 6400 oz

## 2021-02-14 DIAGNOSIS — I10 Essential (primary) hypertension: Secondary | ICD-10-CM

## 2021-02-14 DIAGNOSIS — F17210 Nicotine dependence, cigarettes, uncomplicated: Secondary | ICD-10-CM

## 2021-02-14 DIAGNOSIS — Z09 Encounter for follow-up examination after completed treatment for conditions other than malignant neoplasm: Secondary | ICD-10-CM

## 2021-02-14 DIAGNOSIS — F17200 Nicotine dependence, unspecified, uncomplicated: Secondary | ICD-10-CM

## 2021-02-14 DIAGNOSIS — E662 Morbid (severe) obesity with alveolar hypoventilation: Secondary | ICD-10-CM

## 2021-02-14 DIAGNOSIS — Z6841 Body Mass Index (BMI) 40.0 and over, adult: Secondary | ICD-10-CM

## 2021-02-14 DIAGNOSIS — Z131 Encounter for screening for diabetes mellitus: Secondary | ICD-10-CM

## 2021-02-14 LAB — POCT GLYCOSYLATED HEMOGLOBIN (HGB A1C): Hemoglobin A1C: 5.5 % (ref 4.0–5.6)

## 2021-02-14 MED ORDER — LOSARTAN POTASSIUM 50 MG PO TABS: 50.0000 mg | ORAL_TABLET | Freq: Every day | ORAL | 1 refills | Status: DC

## 2021-02-14 MED ORDER — CHLORTHALIDONE 50 MG PO TABS: 50.0000 mg | ORAL_TABLET | Freq: Every day | ORAL | 1 refills | Status: DC

## 2021-02-14 MED ORDER — AMLODIPINE BESYLATE 10 MG PO TABS: 10.0000 mg | ORAL_TABLET | Freq: Every day | ORAL | 1 refills | Status: DC

## 2021-02-14 NOTE — Progress Notes (Signed)
[facility]   Subjective:   [name] is a 31 y.o. adult presents for hospital follow up and establish care. Patient presented to the ED with headaches and shortness of breath, on 02/06/21, treated and discharged on the same day.Denies chest pain. He does have SOB, and edema lower extremity  Past Medical History:  Diagnosis Date   Hypertension    not on medication   Obese    Peripheral vascular disease (HCC)    edema in legs      No Known Allergies    Current Outpatient Medications on File Prior to Visit  Medication Sig Dispense Refill   doxycycline (VIBRA-TABS) 100 MG tablet Take 1 tablet (100 mg total) by mouth 2 (two) times daily. 14 tablet 0   metroNIDAZOLE (FLAGYL) 500 MG tablet Take 1 tablet (500 mg total) by mouth 2 (two) times daily. 14 tablet 0   amLODipine (NORVASC) 5 MG tablet Take 1 tablet (5 mg total) by mouth daily. (Patient not taking: Reported on 02/14/2021) 14 tablet 0   furosemide (LASIX) 40 MG tablet Take 1 tablet (40 mg total) by mouth daily. (Patient not taking: Reported on 02/14/2021) 30 tablet 3   losartan (COZAAR) 25 MG tablet Take 1 tablet (25 mg total) by mouth daily. (Patient not taking: Reported on 02/14/2021) 14 tablet 0   No current facility-administered medications on file prior to visit.     Review of System: Comprehensive ROS pertinent positive and negative noted in HPI  Objective:  BP (!) 159/100 (BP Location: Right Arm, Patient Position: Sitting, Cuff Size: Large)    Pulse 82    Temp (!) 97.5 F (36.4 C) (Temporal)    Ht 6' (1.829 m)    Wt (!) 491 lb 9.6 oz (223 kg)    SpO2 95%    BMI 66.67 kg/m   Filed Weights   02/14/21 1422  Weight: (!) 491 lb 9.6 oz (223 kg)    Physical Exam: General Appearance: Well nourished, severe morbid obesity in no apparent distress. Eyes: PERRLA, EOMs, conjunctiva no swelling or erythema Sinuses: No Frontal/maxillary tenderness ENT/Mouth: Ext aud canals clear, TMs without erythema,  bulging. Neck: thick Supple, thyroid normal.  Respiratory: Respiratory effort normal, BS equal bilaterally without rales, rhonchi, wheezing or stridor.  Cardio: RRR with no MRGs. Brisk peripheral pulses without edema.3+ edema bilateral legs Abdomen: Soft, + BS.  Non tender, no guarding, rebound, hernias, masses. Lymphatics: Non tender without lymphadenopathy.  Musculoskeletal: Full ROM, 5/5 strength, normal gait.  Skin: Warm, dry without rashes, lesions, ecchymosis.  Neuro: Cranial nerves intact. Normal muscle tone, no cerebellar symptoms. Sensation intact.  Psych: Awake and oriented X 3, normal affect, Insight and Judgment appropriate.    Assessment:  Jared Mccoy was seen today for hospitalization follow-up.  Diagnoses and all orders for this visit:  Screening for diabetes mellitus -     HgB A1c 5.6  Hospital discharge follow-up Retrieved from hospital discharge [facility] on 02/14/2021; PCP  appointment on January 4th 1:30pm for the management of hypertension and establishment of care  Essential hypertension Blood pressure is unacceptable discussed morbid obesity comorbidities obese hypoventilation syndrome syndrome, increased wrist for heart attack and stroke with the factors of African-American male severe morbid obesity and high blood pressure . Obesity hypoventilation syndrome (HCC) Shortness of breath is hypoventilation syndrome due to severe morbid obesity BMI 66.67.  Discussed weight management, proper eating and snacking habits and will follow-up   Tobacco dependence - I  have recommended complete cessation of tobacco use. I have discussed various options available for assistance with tobacco cessation including over the counter methods (Nicotine gum, patch and lozenges). We also discussed prescription options (Chantix, Nicotine Inhaler / Nasal Spray). The patient is not interested in pursuing any prescription tobacco cessation options at this  time. - Patient declines at this time.    This note has been created with Education officer, environmental. Any transcriptional errors are unintentional.   Grayce Sessions, NP 02/14/2021, 2:47 PM

## 2021-02-14 NOTE — Patient Instructions (Addendum)
Preventing Type 2 Diabetes Mellitus °Type 2 diabetes, also called type 2 diabetes mellitus, is a long-term (chronic) disease that affects sugar (glucose) levels in your blood. Normally, a hormone called insulin allows glucose to enter cells in your body. The cells use glucose for energy. With type 2 diabetes, you will have one or both of these problems: °Your pancreas does not make enough insulin. °Cells in your body do not respond properly to insulin that your body makes (insulin resistance). °Insulin resistance or lack of insulin causes extra glucose to build up in the blood instead of going into cells. As a result, high blood glucose (hyperglycemia) develops. That can cause many complications. Being overweight or obese and having an inactive (sedentary) lifestyle can increase your risk for diabetes. Type 2 diabetes can be delayed or prevented by making certain nutrition and lifestyle changes. °How can this condition affect me? °If you do not take steps to prevent diabetes, your blood glucose levels may keep increasing over time. Too much glucose in your blood for a long time can damage your blood vessels, heart, kidneys, nerves, and eyes. °Type 2 diabetes can lead to chronic health problems and complications, such as: °Heart disease. °Stroke. °Blindness. °Kidney disease. °Depression. °Poor circulation in your feet and legs. In severe cases, a foot or leg may need to be surgically removed (amputated). °What can increase my risk? °You may be more likely to develop type 2 diabetes if you: °Have type 2 diabetes in your family. °Are overweight or obese. °Have a sedentary lifestyle. °Have insulin resistance or a history of prediabetes. °Have a history of pregnancy-related (gestational) diabetes or polycystic ovary syndrome (PCOS). °What actions can I take to prevent this? °It can be difficult to recognize signs of type 2 diabetes. Taking action to prevent the disease before you develop symptoms is the best way to avoid  possible damage to your body. Making certain nutrition and lifestyle changes may prevent or delay the disease and related health problems. °Nutrition ° °Eat healthy meals and snacks regularly. Do not skip meals. Fruit or a handful of nuts is a healthy snack between meals. °Drink water throughout the day. Avoid drinks that contain added sugar, such as soda or sweetened tea. Drink enough fluid to keep your urine pale yellow. °Follow instructions from your health care provider about eating or drinking restrictions. °Limit the amount of food you eat by: °Managing how much you eat at a time (portion size). °Checking food labels for the serving sizes of food. °Using a kitchen scale to weigh amounts of food. °Sauté or steam food instead of frying it. Cook with water or broth instead of oils or butter. °Limit saturated fat and salt (sodium) in your diet. Have no more than 1 tsp (2,400 mg) of sodium a day. If you have heart disease or high blood pressure, use less than ½?¾ tsp (1,500 mg) of sodium a day. °Lifestyle ° °Lose weight if needed and as told. Your health care provider can determine how much weight loss is best for you and can help you lose weight safely. °If you are overweight or obese, you may be told to lose at least 5?7% of your body weight. °Manage blood pressure, cholesterol, and stress. Your health care provider will help determine the best treatment for you. °Do not use any products that contain nicotine or tobacco. These products include cigarettes, chewing tobacco, and vaping devices, such as e-cigarettes. If you need help quitting, ask your health care provider. °Activity ° °Do physical   activity that makes your heart beat faster and makes you sweat (moderate intensity). Do this for at least 30 minutes on at least 5 days of the week, or as much as told by your health care provider. Ask your health care provider what activities are safe for you. A mix of activities may be best, such as walking, swimming,  cycling, and strength training. Try to add physical activity into your day. For example: Park your car farther away than usual so that you walk more. Take a walk during your lunch break. Use stairs instead of elevators or escalators. Walk or bike to work instead of driving. Alcohol use If you drink alcohol: Limit how much you have to: 0?1 drink a day for women who are not pregnant. 0?2 drinks a day for men. Know how much alcohol is in your drink. In the [place] one drink equals one 12 oz bottle of beer (355 mL), one 5 oz glass of wine (148 mL), or one 1 oz glass of hard liquor (44 mL). General information Talk with your health care provider about your risk factors and how you can reduce your risk for diabetes. Have your blood glucose tested regularly, as told by your health care provider. Get screening tests as told by your health care provider. You may have these regularly, especially if you have certain risk factors for type 2 diabetes. Make an appointment with a registered dietitian. This diet and nutrition specialist can help you make a healthy eating plan and help you understand portion sizes and food labels. Where to find support Ask your health care provider to recommend a registered dietitian, a certified diabetes care and education specialist, or a weight loss program. Look for local or online weight loss groups. Join a gym, fitness club, or outdoor activity group, such as a walking club. Where to find more information For help and guidance and to learn more about diabetes and diabetes prevention, visit: American Diabetes Association (ADA): www.diabetes.[employer] of Diabetes and Digestive and Kidney Diseases: [employer] To learn more about healthy eating, visit: U.S. Department of Agriculture Scientist, research (physical sciences)): [employer].no Office of Disease Prevention and Health Promotion (ODPHP): [place].[employer] Summary You can delay or prevent type 2 diabetes by eating healthy  foods, losing weight if needed, and increasing your physical activity. Talk with your health care provider about your risk factors for type 2 diabetes and how you can reduce your risk. It can be difficult to recognize the signs of type 2 diabetes. The best way to avoid possible damage to your body is to take action to prevent the disease before you develop symptoms. Get screening tests as told by your health care provider. This information is not intended to replace advice given to you by your health care provider. Make sure you discuss any questions you have with your health care provider. Document Revised: 04/24/2020 Document Reviewed: 04/24/2020 Elsevier Patient Education  [place]. Hypertension, Adult High blood pressure (hypertension) is when the force of blood pumping through the arteries is too strong. The arteries are the blood vessels that carry blood from the heart throughout the body. Hypertension forces the heart to work harder to pump blood and may cause arteries to become narrow or stiff. Untreated or uncontrolled hypertension can cause a heart attack, heart failure, a stroke, kidney disease, and other problems. A blood pressure reading consists of a higher number over a lower number. Ideally, your blood pressure should be below 120/80. The first ("top") number is called the systolic  pressure. It is a measure of the pressure in your arteries as your heart beats. The second ("bottom") number is called the diastolic pressure. It is a measure of the pressure in your arteries as the heart relaxes. What are the causes? The exact cause of this condition is not known. There are some conditions that result in or are related to high blood pressure. What increases the risk? Some risk factors for high blood pressure are under your control. The following factors may make you more likely to develop this condition: Smoking. Having type 2 diabetes mellitus, high cholesterol, or both. Not  getting enough exercise or physical activity. Being overweight. Having too much fat, sugar, calories, or salt (sodium) in your diet. Drinking too much alcohol. Some risk factors for high blood pressure may be difficult or impossible to change. Some of these factors include: Having chronic kidney disease. Having a family history of high blood pressure. Age. Risk increases with age. Race. You may be at higher risk if you are African American. Gender. Men are at higher risk than women before age 29. After age 79, women are at higher risk than men. Having obstructive sleep apnea. Stress. What are the signs or symptoms? High blood pressure may not cause symptoms. Very high blood pressure (hypertensive crisis) may cause: Headache. Anxiety. Shortness of breath. Nosebleed. Nausea and vomiting. Vision changes. Severe chest pain. Seizures. How is this diagnosed? This condition is diagnosed by measuring your blood pressure while you are seated, with your arm resting on a flat surface, your legs uncrossed, and your feet flat on the floor. The cuff of the blood pressure monitor will be placed directly against the skin of your upper arm at the level of your heart. It should be measured at least twice using the same arm. Certain conditions can cause a difference in blood pressure between your right and left arms. Certain factors can cause blood pressure readings to be lower or higher than normal for a short period of time: When your blood pressure is higher when you are in a health care provider's office than when you are at home, this is called white coat hypertension. Most people with this condition do not need medicines. When your blood pressure is higher at home than when you are in a health care provider's office, this is called masked hypertension. Most people with this condition may need medicines to control blood pressure. If you have a high blood pressure reading during one visit or you have  normal blood pressure with other risk factors, you may be asked to: Return on a different day to have your blood pressure checked again. Monitor your blood pressure at home for 1 week or longer. If you are diagnosed with hypertension, you may have other blood or imaging tests to help your health care provider understand your overall risk for other conditions. How is this treated? This condition is treated by making healthy lifestyle changes, such as eating healthy foods, exercising more, and reducing your alcohol intake. Your health care provider may prescribe medicine if lifestyle changes are not enough to get your blood pressure under control, and if: Your systolic blood pressure is above 130. Your diastolic blood pressure is above 80. Your personal target blood pressure may vary depending on your medical conditions, your age, and other factors. Follow these instructions at home: Eating and drinking  Eat a diet that is high in fiber and potassium, and low in sodium, added sugar, and fat. An example eating plan  is called the DASH (Dietary Approaches to Stop Hypertension) diet. To eat this way: Eat plenty of fresh fruits and vegetables. Try to fill one half of your plate at each meal with fruits and vegetables. Eat whole grains, such as whole-wheat pasta, brown rice, or whole-grain bread. Fill about one fourth of your plate with whole grains. Eat or drink low-fat dairy products, such as skim milk or low-fat yogurt. Avoid fatty cuts of meat, processed or cured meats, and poultry with skin. Fill about one fourth of your plate with lean proteins, such as fish, chicken without skin, beans, eggs, or tofu. Avoid pre-made and processed foods. These tend to be higher in sodium, added sugar, and fat. Reduce your daily sodium intake. Most people with hypertension should eat less than 1,500 mg of sodium a day. Do not drink alcohol if: Your health care provider tells you not to drink. You are pregnant, may  be pregnant, or are planning to become pregnant. If you drink alcohol: Limit how much you use to: 0-1 drink a day for women. 0-2 drinks a day for men. Be aware of how much alcohol is in your drink. In the [place] one drink equals one 12 oz bottle of beer (355 mL), one 5 oz glass of wine (148 mL), or one 1 oz glass of hard liquor (44 mL). Lifestyle  Work with your health care provider to maintain a healthy body weight or to lose weight. Ask what an ideal weight is for you. Get at least 30 minutes of exercise most days of the week. Activities may include walking, swimming, or biking. Include exercise to strengthen your muscles (resistance exercise), such as Pilates or lifting weights, as part of your weekly exercise routine. Try to do these types of exercises for 30 minutes at least 3 days a week. Do not use any products that contain nicotine or tobacco, such as cigarettes, e-cigarettes, and chewing tobacco. If you need help quitting, ask your health care provider. Monitor your blood pressure at home as told by your health care provider. Keep all follow-up visits as told by your health care provider. This is important. Medicines Take over-the-counter and prescription medicines only as told by your health care provider. Follow directions carefully. Blood pressure medicines must be taken as prescribed. Do not skip doses of blood pressure medicine. Doing this puts you at risk for problems and can make the medicine less effective. Ask your health care provider about side effects or reactions to medicines that you should watch for. Contact a health care provider if you: Think you are having a reaction to a medicine you are taking. Have headaches that keep coming back (recurring). Feel dizzy. Have swelling in your ankles. Have trouble with your vision. Get help right away if you: Develop a severe headache or confusion. Have unusual weakness or numbness. Feel faint. Have severe pain in your chest  or abdomen. Vomit repeatedly. Have trouble breathing. Summary Hypertension is when the force of blood pumping through your arteries is too strong. If this condition is not controlled, it may put you at risk for serious complications. Your personal target blood pressure may vary depending on your medical conditions, your age, and other factors. For most people, a normal blood pressure is less than 120/80. Hypertension is treated with lifestyle changes, medicines, or a combination of both. Lifestyle changes include losing weight, eating a healthy, low-sodium diet, exercising more, and limiting alcohol. This information is not intended to replace advice given to you by your  health care provider. Make sure you discuss any questions you have with your health care provider. Document Revised: 10/08/2017 Document Reviewed: 10/08/2017 Elsevier Patient Education  [place].

## 2021-03-14 ENCOUNTER — Ambulatory Visit (INDEPENDENT_AMBULATORY_CARE_PROVIDER_SITE_OTHER): Payer: Self-pay | Admitting: Primary Care

## 2021-07-22 ENCOUNTER — Emergency Department (HOSPITAL_COMMUNITY): Payer: Self-pay

## 2021-07-22 ENCOUNTER — Encounter (HOSPITAL_COMMUNITY): Payer: Self-pay

## 2021-07-22 ENCOUNTER — Emergency Department (HOSPITAL_COMMUNITY)
Admission: EM | Admit: 2021-07-22 | Discharge: 2021-07-22 | Disposition: A | Discharge status: Home / Self Care | Payer: Self-pay | Attending: Emergency Medicine | Admitting: Emergency Medicine

## 2021-07-22 ENCOUNTER — Other Ambulatory Visit: Payer: Self-pay

## 2021-07-22 DIAGNOSIS — E876 Hypokalemia: Secondary | ICD-10-CM | POA: Insufficient documentation

## 2021-07-22 DIAGNOSIS — R609 Edema, unspecified: Secondary | ICD-10-CM

## 2021-07-22 DIAGNOSIS — Z79899 Other long term (current) drug therapy: Secondary | ICD-10-CM | POA: Insufficient documentation

## 2021-07-22 DIAGNOSIS — R600 Localized edema: Secondary | ICD-10-CM | POA: Insufficient documentation

## 2021-07-22 DIAGNOSIS — I10 Essential (primary) hypertension: Secondary | ICD-10-CM | POA: Insufficient documentation

## 2021-07-22 DIAGNOSIS — R2243 Localized swelling, mass and lump, lower limb, bilateral: Secondary | ICD-10-CM | POA: Insufficient documentation

## 2021-07-22 DIAGNOSIS — Z760 Encounter for issue of repeat prescription: Secondary | ICD-10-CM | POA: Insufficient documentation

## 2021-07-22 LAB — CBC WITH DIFFERENTIAL/PLATELET
Abs Immature Granulocytes: 0.02 10*3/uL (ref 0.00–0.07)
Basophils Absolute: 0 10*3/uL (ref 0.0–0.1)
Basophils Relative: 0 %
Eosinophils Absolute: 0.3 10*3/uL (ref 0.0–0.5)
Eosinophils Relative: 4 %
HCT: 37.5 % — ABNORMAL LOW (ref 39.0–52.0)
Hemoglobin: 11.9 g/dL — ABNORMAL LOW (ref 13.0–17.0)
Immature Granulocytes: 0 %
Lymphocytes Relative: 33 %
Lymphs Abs: 2.5 10*3/uL (ref 0.7–4.0)
MCH: 26 pg (ref 26.0–34.0)
MCHC: 31.7 g/dL (ref 30.0–36.0)
MCV: 81.9 fL (ref 80.0–100.0)
Monocytes Absolute: 0.6 10*3/uL (ref 0.1–1.0)
Monocytes Relative: 8 %
Neutro Abs: 4.1 10*3/uL (ref 1.7–7.7)
Neutrophils Relative %: 55 %
Platelets: 256 10*3/uL (ref 150–400)
RBC: 4.58 MIL/uL (ref 4.22–5.81)
RDW: 17 % — ABNORMAL HIGH (ref 11.5–15.5)
WBC: 7.6 10*3/uL (ref 4.0–10.5)
nRBC: 0 % (ref 0.0–0.2)

## 2021-07-22 LAB — COMPREHENSIVE METABOLIC PANEL
ALT: 18 U/L (ref 0–44)
AST: 26 U/L (ref 15–41)
Albumin: 3.6 g/dL (ref 3.5–5.0)
Alkaline Phosphatase: 69 U/L (ref 38–126)
Anion gap: 8 (ref 5–15)
BUN: 16 mg/dL (ref 6–20)
CO2: 31 mmol/L (ref 22–32)
Calcium: 8.7 mg/dL — ABNORMAL LOW (ref 8.9–10.3)
Chloride: 102 mmol/L (ref 98–111)
Creatinine, Ser: 1.22 mg/dL (ref 0.61–1.24)
GFR, Estimated: >60 mL/min (ref >60)
Glucose, Bld: 120 mg/dL — ABNORMAL HIGH (ref 70–99)
Potassium: 3 mmol/L — ABNORMAL LOW (ref 3.5–5.1)
Sodium: 141 mmol/L (ref 135–145)
Total Bilirubin: 0.4 mg/dL (ref 0.3–1.2)
Total Protein: 7.3 g/dL (ref 6.5–8.1)

## 2021-07-22 MED ORDER — LOSARTAN POTASSIUM 50 MG PO TABS: 50.0000 mg | ORAL_TABLET | Freq: Every day | ORAL | 1 refills | Status: DC

## 2021-07-22 MED ORDER — POTASSIUM CHLORIDE CRYS ER 20 MEQ PO TBCR: 80.0000 meq | EXTENDED_RELEASE_TABLET | Freq: Two times a day (BID) | ORAL | Status: DC

## 2021-07-22 MED ORDER — CHLORTHALIDONE 50 MG PO TABS
50.0000 mg | ORAL_TABLET | Freq: Every day | ORAL | Status: DC
  Filled 2021-07-22: qty 1

## 2021-07-22 MED ORDER — AMLODIPINE BESYLATE 10 MG PO TABS: 10.0000 mg | ORAL_TABLET | Freq: Every day | ORAL | 1 refills | Status: DC

## 2021-07-22 MED ORDER — LOSARTAN POTASSIUM 25 MG PO TABS: 50.0000 mg | ORAL_TABLET | Freq: Every day | ORAL | Status: DC

## 2021-07-22 MED ORDER — CHLORTHALIDONE 50 MG PO TABS
50.0000 mg | ORAL_TABLET | Freq: Every day | ORAL | 1 refills | Status: DC
Start: 2021-07-22 — End: 2021-09-16

## 2021-07-22 MED ORDER — FUROSEMIDE 40 MG PO TABS
80.0000 mg | ORAL_TABLET | Freq: Every day | ORAL | Status: DC
  Administered 2021-07-22: 80 mg via ORAL
  Filled 2021-07-22: qty 2

## 2021-07-22 MED ORDER — POTASSIUM CHLORIDE ER 20 MEQ PO TBCR: 40.0000 meq | EXTENDED_RELEASE_TABLET | Freq: Every day | ORAL | 3 refills | Status: DC

## 2021-07-22 MED ORDER — FUROSEMIDE 40 MG PO TABS
80.0000 mg | ORAL_TABLET | Freq: Every day | ORAL | 3 refills | Status: DC
Start: 2021-07-22 — End: 2021-09-16

## 2021-07-22 MED ORDER — AMLODIPINE BESYLATE 5 MG PO TABS: 10.0000 mg | ORAL_TABLET | Freq: Every day | ORAL | Status: DC

## 2021-07-22 NOTE — ED Triage Notes (Signed)
Ambulatory to ED with c/o BLE swelling x 4 days. States this has been a recurring problem for over 4 years. States he takes water pills but it isnt helping.

## 2021-07-22 NOTE — ED Provider Notes (Signed)
[facility] Provider Note   CSN: [id number] Arrival date & time: 07/22/21  0488     History  Chief Complaint  Patient presents with   Leg Swelling    [facility] is a 31 y.o. adult.  31 yo M who presents to the ED today with BLE edema. States he has been out of his medications for at least the last week.  He usually gets them filled in the ER but did have 1 visit at [facility] back in January.  Patient states the last time an ultrasound was last year but this is not in the system unsure if he ever had an echocardiogram.  Patient states he has no fevers no symptom shortness of breath just lower extremity swelling does not get any better.  States he had to take Lasix elevated doses in the past for this.  No cough.        Home Medications Prior to Admission medications   Medication Sig Start Date End Date Taking? Authorizing Provider  amLODipine (NORVASC) 10 MG tablet Take 1 tablet (10 mg total) by mouth daily. 07/22/21   Chivon Lepage, Barbara Cower, MD  chlorthalidone (HYGROTON) 50 MG tablet Take 1 tablet (50 mg total) by mouth daily. 07/22/21   Shatoria Stooksbury, Barbara Cower, MD  furosemide (LASIX) 40 MG tablet Take 2 tablets (80 mg total) by mouth daily. 07/22/21 11/19/21  Aireanna Luellen, Barbara Cower, MD  losartan (COZAAR) 50 MG tablet Take 1 tablet (50 mg total) by mouth daily. 07/22/21   Traci Gafford, Barbara Cower, MD  Potassium Chloride ER 20 MEQ TBCR Take 40 mEq by mouth daily. 07/22/21   Tanayah Squitieri, Barbara Cower, MD      Allergies    Patient has no known allergies.    Review of Systems   Review of Systems  Physical Exam Updated Vital Signs BP (!) 186/93   Pulse 80   Temp 98.1 F (36.7 C) (Oral)   Resp 16   Ht 6' (1.829 m)   Wt (!) 224.5 kg   SpO2 94%   BMI 67.13 kg/m  Physical Exam Vitals and nursing note reviewed.  Constitutional:      Appearance: He is well-developed.  HENT:     Head: Normocephalic and atraumatic.     Mouth/Throat:     Mouth: Mucous membranes are moist.      Pharynx: Oropharynx is clear.  Eyes:     Pupils: Pupils are equal, round, and reactive to light.  Cardiovascular:     Rate and Rhythm: Normal rate.  Pulmonary:     Effort: Pulmonary effort is normal. No respiratory distress.  Abdominal:     General: Abdomen is flat. There is no distension.  Musculoskeletal:        General: Swelling (BLE to knees) and tenderness present. Normal range of motion.     Cervical back: Normal range of motion.  Skin:    General: Skin is warm and dry.     Coloration: Skin is not jaundiced or pale.  Neurological:     General: No focal deficit present.     Mental Status: He is alert.     ED Results / Procedures / Treatments   Labs (all labs ordered are listed, but only abnormal results are displayed) Labs Reviewed  CBC WITH DIFFERENTIAL/PLATELET - Abnormal; Notable for the following components:      Result Value   Hemoglobin 11.9 (*)    HCT 37.5 (*)    RDW 17.0 (*)    All other components  within normal limits  COMPREHENSIVE METABOLIC PANEL - Abnormal; Notable for the following components:   Potassium 3.0 (*)    Glucose, Bld 120 (*)    Calcium 8.7 (*)    All other components within normal limits    EKG None  Radiology DG Chest Portable 1 View  Result Date: 07/22/2021 CLINICAL DATA:  Bilateral lower extremity swelling for 4 days. EXAM: PORTABLE CHEST 1 VIEW COMPARISON:  01/22/2021 FINDINGS: Stable cardiomediastinal contours. There is no pleural effusion or edema. No airspace opacities identified. The visualized osseous structures appear intact. IMPRESSION: No active disease. Electronically Signed   By: Signa Kell M.D.   On: 07/22/2021 05:21    Procedures Procedures    Medications Ordered in ED Medications  amLODipine (NORVASC) tablet 10 mg (has no administration in time range)  chlorthalidone (HYGROTON) tablet 50 mg (has no administration in time range)  losartan (COZAAR) tablet 50 mg (has no administration in time range)   furosemide (LASIX) tablet 80 mg (has no administration in time range)  potassium chloride SA (KLOR-CON M) CR tablet 80 mEq (has no administration in time range)    ED Course/ Medical Decision Making/ A&P                           Medical Decision Making Amount and/or Complexity of Data Reviewed Labs: ordered. Radiology: ordered.  Risk Prescription drug management.   Suspect peripheral edema for likely unofficial CHF and noncompliance. Low suspicion for bilateral cellulits or DVT's.  Labs check to make sure no evidence of renal failure and these are reassuring.  Show hypokalemia.  His blood pressure medications and Lasix were given here in the ER.  We will have him take 80 mg Lasix daily for the next week and then reduce that to 40 mg daily until he follows up with his outpatient physicians.   Final Clinical Impression(s) / ED Diagnoses Final diagnoses:  Medication refill  Hypertension, unspecified type  Peripheral edema  Hypokalemia    Rx / DC Orders ED Discharge Orders          Ordered    amLODipine (NORVASC) 10 MG tablet  Daily        07/22/21 0557    chlorthalidone (HYGROTON) 50 MG tablet  Daily        07/22/21 0557    furosemide (LASIX) 40 MG tablet  Daily       Note to Pharmacy: D/C lasix 20mg  tablet   07/22/21 0557    losartan (COZAAR) 50 MG tablet  Daily        07/22/21 0557    Potassium Chloride ER 20 MEQ TBCR  Daily        07/22/21 0557              Hiroshi Krummel, 09/21/21, MD 07/22/21 0600

## 2021-07-22 NOTE — Discharge Instructions (Addendum)
Please take the 2 Lasix (80mg ) daily for the next 7 days. Then reduce to 1 Lasix (40mg ) daily until you see your primary doctor.

## 2021-09-16 ENCOUNTER — Emergency Department (HOSPITAL_COMMUNITY)
Admission: EM | Admit: 2021-09-16 | Discharge: 2021-09-16 | Disposition: A | Discharge status: Home / Self Care | Payer: Medicaid Other | Attending: Emergency Medicine | Admitting: Emergency Medicine

## 2021-09-16 ENCOUNTER — Emergency Department (HOSPITAL_COMMUNITY): Payer: Medicaid Other

## 2021-09-16 ENCOUNTER — Other Ambulatory Visit: Payer: Self-pay

## 2021-09-16 DIAGNOSIS — S81011A Laceration without foreign body, right knee, initial encounter: Secondary | ICD-10-CM | POA: Insufficient documentation

## 2021-09-16 DIAGNOSIS — Y9302 Activity, running: Secondary | ICD-10-CM | POA: Insufficient documentation

## 2021-09-16 DIAGNOSIS — I10 Essential (primary) hypertension: Secondary | ICD-10-CM | POA: Diagnosis not present

## 2021-09-16 DIAGNOSIS — W260XXA Contact with knife, initial encounter: Secondary | ICD-10-CM | POA: Insufficient documentation

## 2021-09-16 DIAGNOSIS — I160 Hypertensive urgency: Secondary | ICD-10-CM

## 2021-09-16 DIAGNOSIS — S8991XA Unspecified injury of right lower leg, initial encounter: Secondary | ICD-10-CM | POA: Diagnosis present

## 2021-09-16 MED ORDER — AMLODIPINE BESYLATE 10 MG PO TABS: 10.0000 mg | ORAL_TABLET | Freq: Every day | ORAL | 1 refills | Status: DC

## 2021-09-16 MED ORDER — BACITRACIN ZINC 500 UNIT/GM EX OINT
TOPICAL_OINTMENT | CUTANEOUS | Status: AC
  Administered 2021-09-16: 1
  Filled 2021-09-16: qty 0.9

## 2021-09-16 MED ORDER — FUROSEMIDE 40 MG PO TABS: 80.0000 mg | ORAL_TABLET | Freq: Every day | ORAL | 1 refills | Status: DC

## 2021-09-16 MED ORDER — CLONIDINE HCL 0.1 MG PO TABS
0.2000 mg | ORAL_TABLET | Freq: Once | ORAL | Status: AC
  Administered 2021-09-16: 0.2 mg via ORAL
  Filled 2021-09-16: qty 2

## 2021-09-16 MED ORDER — LIDOCAINE HCL (PF) 1 % IJ SOLN
5.0000 mL | Freq: Once | INTRAMUSCULAR | Status: AC
  Administered 2021-09-16: 5 mL via INTRADERMAL
  Filled 2021-09-16: qty 30

## 2021-09-16 MED ORDER — LOSARTAN POTASSIUM 50 MG PO TABS
50.0000 mg | ORAL_TABLET | Freq: Every day | ORAL | 1 refills | Status: DC
Start: 2021-09-16 — End: 2021-11-07

## 2021-09-16 MED ORDER — POTASSIUM CHLORIDE ER 20 MEQ PO TBCR: 40.0000 meq | EXTENDED_RELEASE_TABLET | Freq: Every day | ORAL | 1 refills | Status: DC

## 2021-09-16 MED ORDER — CHLORTHALIDONE 50 MG PO TABS: 50.0000 mg | ORAL_TABLET | Freq: Every day | ORAL | 1 refills | Status: DC

## 2021-09-16 NOTE — ED Triage Notes (Signed)
Pt via POV c/o laceration to right knee. He states that he fell and landed on a knife but is not exactly sure how it happened. He denies additional injury. Pt is very sleepy/lethargic during triage, denies ETOH/drug use. ~3in open laceration noted to right anterior knee, initially wrapped in a washcloth from home. Cloth removed for assessment and minimal active bleeding noted. Gauze applied. Pt noted to be hypertensive at 244/132. Pt reports hx HTN with med compliance, last dose yesterday. Bilateral 2+ pitting edema noted to lower legs.

## 2021-09-16 NOTE — ED Notes (Signed)
Informed Dr. Judd Lien of pt vitals and presentation.

## 2021-09-16 NOTE — ED Provider Notes (Addendum)
[facility]-EMERGENCY DEPT Provider Note   CSN: [id number] Arrival date & time: 09/16/21  0142     History  Chief Complaint  Patient presents with   Laceration    [facility] is a 31 y.o. adult.  Patient is a 31 year old male with past medical history of hypertension and morbid obesity patient presenting with a right knee laceration.  He states that he was running with a knife when he fell and landed on the left, causing a laceration just below his left knee.  Bleeding controlled with direct pressure.  Patient also found to be significantly hypertensive upon arrival, but denies chest pain, headache, or other symptoms related to this.  He tells me he missed his antihypertensive medication for the past 2 days.  He also reports being out of several of his medications.  The history is provided by the patient.  Laceration Location: right knee. Length:  3.5 cm Depth:  Through dermis Quality: avulsion   Bleeding: controlled   Laceration mechanism:  Knife      Home Medications Prior to Admission medications   Medication Sig Start Date End Date Taking? Authorizing Provider  amLODipine (NORVASC) 10 MG tablet Take 1 tablet (10 mg total) by mouth daily. 07/22/21   Mesner, Barbara Cower, MD  chlorthalidone (HYGROTON) 50 MG tablet Take 1 tablet (50 mg total) by mouth daily. 07/22/21   Mesner, Barbara Cower, MD  furosemide (LASIX) 40 MG tablet Take 2 tablets (80 mg total) by mouth daily. 07/22/21 11/19/21  Mesner, Barbara Cower, MD  losartan (COZAAR) 50 MG tablet Take 1 tablet (50 mg total) by mouth daily. 07/22/21   Mesner, Barbara Cower, MD  Potassium Chloride ER 20 MEQ TBCR Take 40 mEq by mouth daily. 07/22/21   Mesner, Barbara Cower, MD      Allergies    Patient has no known allergies.    Review of Systems   Review of Systems  All other systems reviewed and are negative.   Physical Exam Updated Vital Signs BP (!) 208/115   Pulse 87   Temp 98.5 F (36.9 C) (Oral)   Resp 19   Ht 6' (1.829 m)    Wt (!) 204.1 kg   SpO2 96%   BMI 61.03 kg/m  Physical Exam Vitals and nursing note reviewed.  Constitutional:      General: He is not in acute distress.    Appearance: Normal appearance. He is not ill-appearing.  HENT:     Head: Normocephalic and atraumatic.  Pulmonary:     Effort: Pulmonary effort is normal.  Musculoskeletal:     Comments: Just below the right knee, there is a 3.5 centimeter laceration that extends through the dermis.  This does not involve the knee joint.  Skin:    General: Skin is warm and dry.  Neurological:     Mental Status: He is alert and oriented to person, place, and time.     ED Results / Procedures / Treatments   Labs (all labs ordered are listed, but only abnormal results are displayed) Labs Reviewed - No data to display  EKG None  Radiology No results found.  Procedures Procedures    Medications Ordered in ED Medications  cloNIDine (CATAPRES) tablet 0.2 mg (has no administration in time range)  lidocaine (PF) (XYLOCAINE) 1 % injection 5 mL (5 mLs Intradermal Given 09/16/21 0217)    ED Course/ Medical Decision Making/ A&P  Patient presenting with a laceration to the right knee.  This was repaired as below.  Patient  is hypertensive upon presentation, but is having no other symptoms.  Patient given a dose of clonidine here in the ER and will be discharged with refills of his antihypertensives.  Patient to perform local wound care and stitches are to be removed in the next 10 days.  LACERATION REPAIR Performed by: Geoffery Lyons Authorized by: Geoffery Lyons Consent: Verbal consent obtained. Risks and benefits: risks, benefits and alternatives were discussed Consent given by: patient Patient identity confirmed: provided demographic data Prepped and Draped in normal sterile fashion Wound explored  Laceration Location: Right knee  Laceration Length: 3.5 cm  No Foreign Bodies seen or palpated  Anesthesia: local infiltration  Local  anesthetic: lidocaine 1% without epinephrine  Anesthetic total: 5 ml  Irrigation method: syringe Amount of cleaning: standard  Skin closure: 3-0 Prolene  Number of sutures: 8  Technique: Simple interrupted  Patient tolerance: Patient tolerated the procedure well with no immediate complications.   Final Clinical Impression(s) / ED Diagnoses Final diagnoses:  None    Rx / DC Orders ED Discharge Orders     None         Geoffery Lyons, MD 09/16/21 Marcello Fennel    Geoffery Lyons, MD 09/16/21 [phone]

## 2021-09-16 NOTE — Discharge Instructions (Signed)
Local wound care with bacitracin and dressing changes twice daily.  Sutures are to be removed in the next 10 to 14 days.  Please follow-up with your primary doctor for this.  Return to the emergency department if you develop redness around the wound, pus draining from the wound, red streaks up or down the leg, or for other new and concerning symptoms.  Refills of your blood pressure medications have been provided for you.  Please follow-up with your primary doctor later this week for a recheck of your blood pressure.

## 2021-10-03 ENCOUNTER — Encounter (INDEPENDENT_AMBULATORY_CARE_PROVIDER_SITE_OTHER): Payer: Self-pay | Admitting: Primary Care

## 2021-10-03 ENCOUNTER — Ambulatory Visit (INDEPENDENT_AMBULATORY_CARE_PROVIDER_SITE_OTHER): Payer: Medicaid Other | Admitting: Primary Care

## 2021-10-03 VITALS — BP 158/106 | HR 75 | Temp 98.0°F | Ht 72.0 in | Wt >= 6400 oz

## 2021-10-03 DIAGNOSIS — R7303 Prediabetes: Secondary | ICD-10-CM

## 2021-10-03 DIAGNOSIS — F17210 Nicotine dependence, cigarettes, uncomplicated: Secondary | ICD-10-CM | POA: Diagnosis not present

## 2021-10-03 DIAGNOSIS — I10 Essential (primary) hypertension: Secondary | ICD-10-CM

## 2021-10-03 DIAGNOSIS — E662 Morbid (severe) obesity with alveolar hypoventilation: Secondary | ICD-10-CM

## 2021-10-03 DIAGNOSIS — Z131 Encounter for screening for diabetes mellitus: Secondary | ICD-10-CM

## 2021-10-03 DIAGNOSIS — F17200 Nicotine dependence, unspecified, uncomplicated: Secondary | ICD-10-CM

## 2021-10-03 LAB — POCT GLYCOSYLATED HEMOGLOBIN (HGB A1C): Hemoglobin A1C: 5.7 % — AB (ref 4.0–5.6)

## 2021-10-03 NOTE — Progress Notes (Signed)
[facility]   [facility] is a 31 y.o. adult presents for hypertension evaluation, Denies  headaches, chest pain or lower extremity edema, sudden onset, vision changes, unilateral weakness, dizziness, paresthesias .  Patient denies adherence with medications. Pt did not pick up any of the medications prescribed. States he was in the hospital with his spouse and was unsure if he could still get them   Past Medical History:  Diagnosis Date   Hypertension    not on medication   Obese    Peripheral vascular disease (HCC)    edema in legs    Past Surgical History:  Procedure Laterality Date   MANDIBULAR HARDWARE REMOVAL N/A 12/27/2016   Procedure: MANDIBULAR HARDWARE REMOVAL;  Surgeon: Newman Pies, MD;  Location: [facility] OR;  Service: ENT;  Laterality: N/A;   OPEN REDUCTION INTERNAL FIXATION (ORIF) DISTAL RADIAL FRACTURE Left 11/10/2016   Procedure: OPEN REDUCTION INTERNAL FIXATION (ORIF) DISTAL RADIAL FRACTURE;  Surgeon: Mack Hook, MD;  Location: [facility] OR;  Service: Orthopedics;  Laterality: Left;   ORIF MANDIBULAR FRACTURE Right 11/10/2016   Procedure: OPEN REDUCTION INTERNAL FIXATION (ORIF) MANDIBULAR FRACTURE, Ear Laceration Repair;  Surgeon: Newman Pies, MD;  Location: [facility] OR;  Service: ENT;  Laterality: Right;   No Known Allergies Current Outpatient Medications on File Prior to Visit  Medication Sig Dispense Refill   amLODipine (NORVASC) 10 MG tablet Take 1 tablet (10 mg total) by mouth daily. (Patient not taking: Reported on 10/03/2021) 30 tablet 1   chlorthalidone (HYGROTON) 50 MG tablet Take 1 tablet (50 mg total) by mouth daily. (Patient not taking: Reported on 10/03/2021) 30 tablet 1   furosemide (LASIX) 40 MG tablet Take 2 tablets (80 mg total) by mouth daily. (Patient not taking: Reported on 10/03/2021) 60 tablet 1   losartan (COZAAR) 50 MG tablet Take 1 tablet (50 mg total) by mouth daily. (Patient not taking: Reported on 10/03/2021) 30 tablet 1   Potassium Chloride ER  20 MEQ TBCR Take 40 mEq by mouth daily. (Patient not taking: Reported on 10/03/2021) 60 tablet 1   No current facility-administered medications on file prior to visit.   Social History   Socioeconomic History   Marital status: Single    Spouse name: Not on file   Number of children: Not on file   Years of education: Not on file   Highest education level: Not on file  Occupational History   Not on file  Tobacco Use   Smoking status: Every Day    Packs/day: 0.50    Years: 20.00    Total pack years: 10.00    Types: Cigarettes   Smokeless tobacco: Never  Vaping Use   Vaping Use: Never used  Substance and Sexual Activity   Alcohol use: Yes    Comment: 12/26/16- 1 fifth a week   Drug use: No   Sexual activity: Yes  Other Topics Concern   Not on file  Social History Narrative   ** Merged History Encounter **       Social Determinants of Health   Financial Resource Strain: Not on file  Food Insecurity: Not on file  Transportation Needs: Not on file  Physical Activity: Not on file  Stress: Not on file  Social Connections: Not on file  Intimate Partner Violence: Not on file   No family history on file.   OBJECTIVE:  Vitals:   10/03/21 1125  BP: (!) 159/111  Pulse: 75  Temp: 98 F (36.7 C)  TempSrc: Oral  SpO2: 95%  Weight: (!) 507 lb 3.2 oz (230.1 kg)  Height: 6' (1.829 m)    Physical Exam Vitals reviewed.  Constitutional:      Comments: Severe morbid obesity  HENT:     Head: Normocephalic.     Right Ear: Tympanic membrane and external ear normal.     Left Ear: Tympanic membrane and external ear normal.     Nose: Nose normal.  Eyes:     Extraocular Movements: Extraocular movements intact.  Cardiovascular:     Rate and Rhythm: Normal rate and regular rhythm.  Pulmonary:     Effort: Pulmonary effort is normal.     Breath sounds: Normal breath sounds.  Abdominal:     General: Bowel sounds are normal. There is distension.     Palpations: Abdomen is  soft.  Musculoskeletal:        General: Normal range of motion.     Cervical back: Normal range of motion and neck supple.  Skin:    General: Skin is warm and dry.  Neurological:     Mental Status: He is alert and oriented to person, place, and time.  Psychiatric:        Mood and Affect: Mood normal.        Behavior: Behavior normal.    ROS Comprehensive ROS Pertinent positive and negative noted in HPI   Last 3 Office BP readings: BP Readings from Last 3 Encounters:  10/03/21 (!) 159/111  09/16/21 (!) 201/109  07/22/21 (!) 186/93    BMET    Component Value Date/Time   NA 141 07/22/2021 0428   K 3.0 (L) 07/22/2021 0428   CL 102 07/22/2021 0428   CO2 31 07/22/2021 0428   GLUCOSE 120 (H) 07/22/2021 0428   BUN 16 07/22/2021 0428   CREATININE 1.22 07/22/2021 0428   CALCIUM 8.7 (L) 07/22/2021 0428   GFRNONAA >60 07/22/2021 0428   GFRAA >60 08/12/2019 2123    Renal function: CrCl cannot be calculated (Patient's most recent lab result is older than the maximum 21 days allowed.).  Clinical ASCVD The ASCVD Risk score (Arnett DK, et al., 2019) failed to calculate for the following reasons:   The 2019 ASCVD risk score is only valid for ages 7 to 75  ASCVD risk factors include- [place]   ASSESSMENT & PLAN:  Olivier was seen today for hypertension and edema.  Diagnoses and all orders for this visit:  Screening for diabetes mellitus -     HgB A1c 5.7  Essential hypertension -Counseled on lifestyle modifications for blood pressure control including reduced dietary sodium, increased exercise, weight reduction and adequate sleep. Also, educated patient about the risk for cardiovascular events, stroke and heart attack .severe morbid obesity which can lead to hypoventilation which she does have intermittent shortness of breath with exertion, African-American male, high blood pressure and uses tobacco very high risk for stroke heart attack or both Also counseled patient about the  importance of medication adherence. If you participate in smoking, it is important to stop using tobacco as this will increase the risks associated with uncontrolled blood pressure.   Tobacco dependence - I have recommended complete cessation of tobacco use. I have discussed various options available for assistance with tobacco cessation including over the counter methods (Nicotine gum, patch and lozenges). We also discussed prescription options (Chantix, Nicotine Inhaler / Nasal Spray). The patient is not interested in pursuing any prescription tobacco cessation options at this time. - Patient declines at this time.  -  Less than 5 minutes spent on counseling.   Prediabetes New diagnosis per ADA guidelines 5.7-6.4 is prediabetes encourage monitoring his carbohydrates such as grits, rice, potatoes, macaroni and cheese, sweets, sodas and recommend losing weight   This note has been created with Education officer, environmental. Any transcriptional errors are unintentional.   Grayce Sessions, NP 10/03/2021, 11:37 AM

## 2021-10-03 NOTE — Progress Notes (Signed)
Last weight documented is incorrect was not weighed. Was only asked what his weight was and that was the number he provided

## 2021-10-03 NOTE — Patient Instructions (Addendum)
Hypertension, Adult Hypertension is another name for high blood pressure. High blood pressure forces your heart to work harder to pump blood. This can cause problems over time. There are two numbers in a blood pressure reading. There is a top number (systolic) over a bottom number (diastolic). It is best to have a blood pressure that is below 120/80. What are the causes? The cause of this condition is not known. Some other conditions can lead to high blood pressure. What increases the risk? Some lifestyle factors can make you more likely to develop high blood pressure: Smoking. Not getting enough exercise or physical activity. Being overweight. Having too much fat, sugar, calories, or salt (sodium) in your diet. Drinking too much alcohol. Other risk factors include: Having any of these conditions: Heart disease. Diabetes. High cholesterol. Kidney disease. Obstructive sleep apnea. Having a family history of high blood pressure and high cholesterol. Age. The risk increases with age. Stress. What are the signs or symptoms? High blood pressure may not cause symptoms. Very high blood pressure (hypertensive crisis) may cause: Headache. Fast or uneven heartbeats (palpitations). Shortness of breath. Nosebleed. Vomiting or feeling like you may vomit (nauseous). Changes in how you see. Very bad chest pain. Feeling dizzy. Seizures. How is this treated? This condition is treated by making healthy lifestyle changes, such as: Eating healthy foods. Exercising more. Drinking less alcohol. Your doctor may prescribe medicine if lifestyle changes do not help enough and if: Your top number is above 130. Your bottom number is above 80. Your personal target blood pressure may vary. Follow these instructions at home: Eating and drinking  If told, follow the DASH eating plan. To follow this plan: Fill one half of your plate at each meal with fruits and vegetables. Fill one fourth of your plate  at each meal with whole grains. Whole grains include whole-wheat pasta, brown rice, and whole-grain bread. Eat or drink low-fat dairy products, such as skim milk or low-fat yogurt. Fill one fourth of your plate at each meal with low-fat (lean) proteins. Low-fat proteins include fish, chicken without skin, eggs, beans, and tofu. Avoid fatty meat, cured and processed meat, or chicken with skin. Avoid pre-made or processed food. Limit the amount of salt in your diet to less than 1,500 mg each day. Do not drink alcohol if: Your doctor tells you not to drink. You are pregnant, may be pregnant, or are planning to become pregnant. If you drink alcohol: Limit how much you have to: 0-1 drink a day for women. 0-2 drinks a day for men. Know how much alcohol is in your drink. In the [place] one drink equals one 12 oz bottle of beer (355 mL), one 5 oz glass of wine (148 mL), or one 1 oz glass of hard liquor (44 mL). Lifestyle  Work with your doctor to stay at a healthy weight or to lose weight. Ask your doctor what the best weight is for you. Get at least 30 minutes of exercise that causes your heart to beat faster (aerobic exercise) most days of the week. This may include walking, swimming, or biking. Get at least 30 minutes of exercise that strengthens your muscles (resistance exercise) at least 3 days a week. This may include lifting weights or doing Pilates. Do not smoke or use any products that contain nicotine or tobacco. If you need help quitting, ask your doctor. Check your blood pressure at home as told by your doctor. Keep all follow-up visits. Medicines Take over-the-counter and prescription medicines  only as told by your doctor. Follow directions carefully. Do not skip doses of blood pressure medicine. The medicine does not work as well if you skip doses. Skipping doses also puts you at risk for problems. Ask your doctor about side effects or reactions to medicines that you should watch  for. Contact a doctor if: You think you are having a reaction to the medicine you are taking. You have headaches that keep coming back. You feel dizzy. You have swelling in your ankles. You have trouble with your vision. Get help right away if: You get a very bad headache. You start to feel mixed up (confused). You feel weak or numb. You feel faint. You have very bad pain in your: Chest. Belly (abdomen). You vomit more than once. You have trouble breathing. These symptoms may be an emergency. Get help right away. Call 911. Do not wait to see if the symptoms will go away. Do not drive yourself to the hospital. Summary Hypertension is another name for high blood pressure. High blood pressure forces your heart to work harder to pump blood. For most people, a normal blood pressure is less than 120/80. Making healthy choices can help lower blood pressure. If your blood pressure does not get lower with healthy choices, you may need to take medicine. This information is not intended to replace advice given to you by your health care provider. Make sure you discuss any questions you have with your health care provider. Document Revised: 11/16/2020 Document Reviewed: 11/16/2020 Elsevier Patient Education  [place]. Prediabetes Eating Plan Prediabetes is a condition that causes blood sugar (glucose) levels to be higher than normal. This increases the risk for developing type 2 diabetes (type 2 diabetes mellitus). Working with a health care provider or nutrition specialist (dietitian) to make diet and lifestyle changes can help prevent the onset of diabetes. These changes may help you: Control your blood glucose levels. Improve your cholesterol levels. Manage your blood pressure. What are tips for following this plan? Reading food labels Read food labels to check the amount of fat, salt (sodium), and sugar in prepackaged foods. Avoid foods that have: Saturated fats. Trans  fats. Added sugars. Avoid foods that have more than 300 milligrams (mg) of sodium per serving. Limit your sodium intake to less than 2,300 mg each day. Shopping Avoid buying pre-made and processed foods. Avoid buying drinks with added sugar. Cooking Cook with olive oil. Do not use butter, lard, or ghee. Bake, broil, grill, steam, or boil foods. Avoid frying. Meal planning  Work with your dietitian to create an eating plan that is right for you. This may include tracking how many calories you take in each day. Use a food diary, notebook, or mobile application to track what you eat at each meal. Consider following a Mediterranean diet. This includes: Eating several servings of fresh fruits and vegetables each day. Eating fish at least twice a week. Eating one serving each day of whole grains, beans, nuts, and seeds. Using olive oil instead of other fats. Limiting alcohol. Limiting red meat. Using nonfat or low-fat dairy products. Consider following a plant-based diet. This includes dietary choices that focus on eating mostly vegetables and fruit, grains, beans, nuts, and seeds. If you have high blood pressure, you may need to limit your sodium intake or follow a diet such as the DASH (Dietary Approaches to Stop Hypertension) eating plan. The DASH diet aims to lower high blood pressure. Lifestyle Set weight loss goals with help from  your health care team. It is recommended that most people with prediabetes lose 7% of their body weight. Exercise for at least 30 minutes 5 or more days a week. Attend a support group or seek support from a mental health counselor. Take over-the-counter and prescription medicines only as told by your health care provider. What foods are recommended? Fruits Berries. Bananas. Apples. Oranges. Grapes. Papaya. Mango. Pomegranate. Kiwi. Grapefruit. Cherries. Vegetables Lettuce. Spinach. Peas. Beets. Cauliflower. Cabbage. Broccoli. Carrots. Tomatoes. Squash.  Eggplant. Herbs. Peppers. Onions. Cucumbers. Brussels sprouts. Grains Whole grains, such as whole-wheat or whole-grain breads, crackers, cereals, and pasta. Unsweetened oatmeal. Bulgur. Barley. Quinoa. Brown rice. Corn or whole-wheat flour tortillas or taco shells. Meats and other proteins Seafood. Poultry without skin. Lean cuts of pork and beef. Tofu. Eggs. Nuts. Beans. Dairy Low-fat or fat-free dairy products, such as yogurt, cottage cheese, and cheese. Beverages Water. Tea. Coffee. Sugar-free or diet soda. Seltzer water. Low-fat or nonfat milk. Milk alternatives, such as soy or almond milk. Fats and oils Olive oil. Canola oil. Sunflower oil. Grapeseed oil. Avocado. Walnuts. Sweets and desserts Sugar-free or low-fat pudding. Sugar-free or low-fat ice cream and other frozen treats. Seasonings and condiments Herbs. Sodium-free spices. Mustard. Relish. Low-salt, low-sugar ketchup. Low-salt, low-sugar barbecue sauce. Low-fat or fat-free mayonnaise. The items listed above may not be a complete list of recommended foods and beverages. Contact a dietitian for more information. What foods are not recommended? Fruits Fruits canned with syrup. Vegetables Canned vegetables. Frozen vegetables with butter or cream sauce. Grains Refined white flour and flour products, such as bread, pasta, snack foods, and cereals. Meats and other proteins Fatty cuts of meat. Poultry with skin. Breaded or fried meat. Processed meats. Dairy Full-fat yogurt, cheese, or milk. Beverages Sweetened drinks, such as iced tea and soda. Fats and oils Butter. Lard. Ghee. Sweets and desserts Baked goods, such as cake, cupcakes, pastries, cookies, and cheesecake. Seasonings and condiments Spice mixes with added salt. Ketchup. Barbecue sauce. Mayonnaise. The items listed above may not be a complete list of foods and beverages that are not recommended. Contact a dietitian for more information. Where to find more  information American Diabetes Association: www.diabetes.org Summary You may need to make diet and lifestyle changes to help prevent the onset of diabetes. These changes can help you control blood sugar, improve cholesterol levels, and manage blood pressure. Set weight loss goals with help from your health care team. It is recommended that most people with prediabetes lose 7% of their body weight. Consider following a Mediterranean diet. This includes eating plenty of fresh fruits and vegetables, whole grains, beans, nuts, seeds, fish, and low-fat dairy, and using olive oil instead of other fats. This information is not intended to replace advice given to you by your health care provider. Make sure you discuss any questions you have with your health care provider. Document Revised: 04/29/2019 Document Reviewed: 04/29/2019 Elsevier Patient Education  [place]. Prediabetes Eating Plan Prediabetes is a condition that causes blood sugar (glucose) levels to be higher than normal. This increases the risk for developing type 2 diabetes (type 2 diabetes mellitus). Working with a health care provider or nutrition specialist (dietitian) to make diet and lifestyle changes can help prevent the onset of diabetes. These changes may help you: Control your blood glucose levels. Improve your cholesterol levels. Manage your blood pressure. What are tips for following this plan? Reading food labels Read food labels to check the amount of fat, salt (sodium), and sugar in prepackaged foods.  Avoid foods that have: Saturated fats. Trans fats. Added sugars. Avoid foods that have more than 300 milligrams (mg) of sodium per serving. Limit your sodium intake to less than 2,300 mg each day. Shopping Avoid buying pre-made and processed foods. Avoid buying drinks with added sugar. Cooking Cook with olive oil. Do not use butter, lard, or ghee. Bake, broil, grill, steam, or boil foods. Avoid frying. Meal  planning  Work with your dietitian to create an eating plan that is right for you. This may include tracking how many calories you take in each day. Use a food diary, notebook, or mobile application to track what you eat at each meal. Consider following a Mediterranean diet. This includes: Eating several servings of fresh fruits and vegetables each day. Eating fish at least twice a week. Eating one serving each day of whole grains, beans, nuts, and seeds. Using olive oil instead of other fats. Limiting alcohol. Limiting red meat. Using nonfat or low-fat dairy products. Consider following a plant-based diet. This includes dietary choices that focus on eating mostly vegetables and fruit, grains, beans, nuts, and seeds. If you have high blood pressure, you may need to limit your sodium intake or follow a diet such as the DASH (Dietary Approaches to Stop Hypertension) eating plan. The DASH diet aims to lower high blood pressure. Lifestyle Set weight loss goals with help from your health care team. It is recommended that most people with prediabetes lose 7% of their body weight. Exercise for at least 30 minutes 5 or more days a week. Attend a support group or seek support from a mental health counselor. Take over-the-counter and prescription medicines only as told by your health care provider. What foods are recommended? Fruits Berries. Bananas. Apples. Oranges. Grapes. Papaya. Mango. Pomegranate. Kiwi. Grapefruit. Cherries. Vegetables Lettuce. Spinach. Peas. Beets. Cauliflower. Cabbage. Broccoli. Carrots. Tomatoes. Squash. Eggplant. Herbs. Peppers. Onions. Cucumbers. Brussels sprouts. Grains Whole grains, such as whole-wheat or whole-grain breads, crackers, cereals, and pasta. Unsweetened oatmeal. Bulgur. Barley. Quinoa. Brown rice. Corn or whole-wheat flour tortillas or taco shells. Meats and other proteins Seafood. Poultry without skin. Lean cuts of pork and beef. Tofu. Eggs. Nuts.  Beans. Dairy Low-fat or fat-free dairy products, such as yogurt, cottage cheese, and cheese. Beverages Water. Tea. Coffee. Sugar-free or diet soda. Seltzer water. Low-fat or nonfat milk. Milk alternatives, such as soy or almond milk. Fats and oils Olive oil. Canola oil. Sunflower oil. Grapeseed oil. Avocado. Walnuts. Sweets and desserts Sugar-free or low-fat pudding. Sugar-free or low-fat ice cream and other frozen treats. Seasonings and condiments Herbs. Sodium-free spices. Mustard. Relish. Low-salt, low-sugar ketchup. Low-salt, low-sugar barbecue sauce. Low-fat or fat-free mayonnaise. The items listed above may not be a complete list of recommended foods and beverages. Contact a dietitian for more information. What foods are not recommended? Fruits Fruits canned with syrup. Vegetables Canned vegetables. Frozen vegetables with butter or cream sauce. Grains Refined white flour and flour products, such as bread, pasta, snack foods, and cereals. Meats and other proteins Fatty cuts of meat. Poultry with skin. Breaded or fried meat. Processed meats. Dairy Full-fat yogurt, cheese, or milk. Beverages Sweetened drinks, such as iced tea and soda. Fats and oils Butter. Lard. Ghee. Sweets and desserts Baked goods, such as cake, cupcakes, pastries, cookies, and cheesecake. Seasonings and condiments Spice mixes with added salt. Ketchup. Barbecue sauce. Mayonnaise. The items listed above may not be a complete list of foods and beverages that are not recommended. Contact a dietitian for more information. Where to  find more information American Diabetes Association: www.diabetes.org Summary You may need to make diet and lifestyle changes to help prevent the onset of diabetes. These changes can help you control blood sugar, improve cholesterol levels, and manage blood pressure. Set weight loss goals with help from your health care team. It is recommended that most people with prediabetes lose 7%  of their body weight. Consider following a Mediterranean diet. This includes eating plenty of fresh fruits and vegetables, whole grains, beans, nuts, seeds, fish, and low-fat dairy, and using olive oil instead of other fats. This information is not intended to replace advice given to you by your health care provider. Make sure you discuss any questions you have with your health care provider. Document Revised: 04/29/2019 Document Reviewed: 04/29/2019 Elsevier Patient Education  2023 [employer].

## 2021-11-07 ENCOUNTER — Other Ambulatory Visit: Payer: Self-pay

## 2021-11-07 ENCOUNTER — Ambulatory Visit (INDEPENDENT_AMBULATORY_CARE_PROVIDER_SITE_OTHER): Payer: Medicaid Other | Admitting: Primary Care

## 2021-11-07 ENCOUNTER — Encounter (INDEPENDENT_AMBULATORY_CARE_PROVIDER_SITE_OTHER): Payer: Self-pay | Admitting: Primary Care

## 2021-11-07 VITALS — BP 173/130 | HR 75 | Resp 16 | Wt >= 6400 oz

## 2021-11-07 DIAGNOSIS — F17210 Nicotine dependence, cigarettes, uncomplicated: Secondary | ICD-10-CM | POA: Diagnosis not present

## 2021-11-07 DIAGNOSIS — R600 Localized edema: Secondary | ICD-10-CM

## 2021-11-07 DIAGNOSIS — I10 Essential (primary) hypertension: Secondary | ICD-10-CM | POA: Diagnosis not present

## 2021-11-07 DIAGNOSIS — Z6841 Body Mass Index (BMI) 40.0 and over, adult: Secondary | ICD-10-CM

## 2021-11-07 DIAGNOSIS — Z0000 Encounter for general adult medical examination without abnormal findings: Secondary | ICD-10-CM

## 2021-11-07 DIAGNOSIS — F17200 Nicotine dependence, unspecified, uncomplicated: Secondary | ICD-10-CM

## 2021-11-07 MED ORDER — FUROSEMIDE 40 MG PO TABS
80.0000 mg | ORAL_TABLET | Freq: Every day | ORAL | 1 refills | Status: DC
  Filled 2021-11-07: qty 60, 30d supply, fill #0
  Filled 2022-04-05 – 2022-07-09 (×2): qty 60, 30d supply, fill #1

## 2021-11-07 MED ORDER — LOSARTAN POTASSIUM 50 MG PO TABS
50.0000 mg | ORAL_TABLET | Freq: Every day | ORAL | 1 refills | Status: DC
  Filled 2021-11-07: qty 30, 30d supply, fill #0
  Filled 2022-04-05 – 2022-07-09 (×2): qty 30, 30d supply, fill #1

## 2021-11-07 MED ORDER — AMLODIPINE BESYLATE 10 MG PO TABS
10.0000 mg | ORAL_TABLET | Freq: Every day | ORAL | 1 refills | Status: DC
  Filled 2021-11-07: qty 30, 30d supply, fill #0
  Filled 2022-04-05 – 2022-07-09 (×2): qty 30, 30d supply, fill #1

## 2021-11-07 MED ORDER — CHLORTHALIDONE 50 MG PO TABS
50.0000 mg | ORAL_TABLET | Freq: Every day | ORAL | 1 refills | Status: DC
  Filled 2021-11-07: qty 30, 30d supply, fill #0
  Filled 2022-04-05 – 2022-07-09 (×2): qty 30, 30d supply, fill #1

## 2021-11-07 NOTE — Progress Notes (Signed)
Jared Mccoy  Jared Mccoy, is a 31 y.o. adult  [id number]  [id number]  DOB - 03-Aug-1990  Chief Complaint  Patient presents with   Blood Pressure Check       Subjective:   [name] is a 31 y.o. adult severe morbid obese male here today for a follow up on HTN. Blood pressure management. He has not picked up medication due to affordability. Asked how much did he smoke 1-2 ppd @ $5 per pack. 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  Patient has No headache, No chest pain, No abdominal pain - No Nausea, No new weakness tingling or numbness, No Cough .- Shortness of breath with exertion.   No problems updated.  No Known Allergies  Past Medical History:  Diagnosis Date   Hypertension    not on medication   Obese    Peripheral vascular disease ([place])    edema in legs     Current Outpatient Medications on File Prior to Visit  Medication Sig Dispense Refill   Potassium Chloride ER 20 MEQ TBCR Take 40 mEq by mouth daily. (Patient not taking: Reported on 10/03/2021) 60 tablet 1   No current facility-administered medications on file prior to visit.    Objective:   Vitals:   11/07/21 1035 11/07/21 1140  BP: (!) 169/124 (!) 173/130  Pulse: 75   Resp: 16   SpO2: 95%   Weight: (!) 516 lb 12.8 oz (234.4 kg)     Exam General appearance : Awake, alert, not in any distress. Speech Clear. Not toxic looking HEENT: Atraumatic and Normocephalic, pupils equally reactive to light and accomodation Neck: Supple, no JVD. No cervical lymphadenopathy.  Chest: Good air entry bilaterally, no added sounds  CVS: S1 S2 regular, no murmurs.  Abdomen: Bowel sounds present, Non tender and not distended with no gaurding, rigidity or rebound. Extremities: B/L Lower Ext shows no edema, both legs are warm to touch Neurology: Awake alert, and oriented X 3, CN II-XII intact, Non  focal Skin: No Rash  Data Review Lab Results  Component Value Date   HGBA1C 5.7 (A) 10/03/2021   HGBA1C 5.5 02/14/2021    Assessment & Plan  [name] was seen today for blood pressure check.  Diagnoses and all orders for this visit:  Healthcare maintenance -     HCV Ab w Reflex to Quant PCR  Essential hypertension BP goal - < 130/80 Explained that having normal blood pressure is the goal and medications are helping to get to goal and maintain normal blood pressure. DIET: Limit salt intake, read nutrition labels to check salt content, limit fried and high fatty foods  Avoid using multisymptom OTC cold preparations that generally contain sudafed which can rise BP. Consult with pharmacist on best cold relief products to use for persons with HTN EXERCISE Discussed incorporating exercise such as walking - 30 minutes most days of the week and can do in 10 minute intervals    Spoke with pharmacist will try to assist with medication at [facility].   Tobacco dependence - I have recommended complete cessation of tobacco use. I have discussed various options available for assistance with tobacco cessation including over the counter methods (Nicotine gum, patch and lozenges). We also discussed prescription options (Chantix, Nicotine Inhaler / Nasal Spray). The patient is not interested in pursuing any prescription tobacco cessation options at this  time. - Patient declines at this time.  - Less than 5 minutes spent on counseling.   Bilateral edema of lower extremity  Prescribed 2 diuretic not taking   Other orders -     losartan (COZAAR) 50 MG tablet; Take 1 tablet (50 mg total) by mouth daily. -     furosemide (LASIX) 40 MG tablet; Take 2 tablets (80 mg total) by mouth daily. -     chlorthalidone (HYGROTON) 50 MG tablet; Take 1 tablet (50 mg total) by mouth daily. -     amLODipine (NORVASC) 10 MG tablet; Take 1 tablet (10 mg total) by mouth daily  Patient have been counseled extensively about  nutrition and exercise. Other issues discussed during this visit include: low cholesterol diet, weight control and daily exercise, foot care, annual eye examinations at [facility], importance of adherence with medications and regular follow-up. We also discussed long term complications of uncontrolled diabetes and hypertension.   Return in about 2 weeks (around 11/21/2021) for on medication.  The patient was given clear instructions to go to ER or return to medical center if symptoms don't improve, worsen or new problems develop. The patient verbalized understanding. The patient was told to call to get lab results if they haven't heard anything in the next week.   This note has been created with Education officer, environmental. Any transcriptional errors are unintentional.   Grayce Sessions, NP 11/07/2021, 1:41 PM

## 2021-11-09 ENCOUNTER — Other Ambulatory Visit: Payer: Self-pay

## 2021-11-09 LAB — HCV INTERPRETATION

## 2021-11-09 LAB — HCV AB W REFLEX TO QUANT PCR: HCV Ab: NONREACTIVE

## 2021-12-07 ENCOUNTER — Ambulatory Visit (INDEPENDENT_AMBULATORY_CARE_PROVIDER_SITE_OTHER): Payer: Self-pay | Admitting: Primary Care

## 2022-04-05 ENCOUNTER — Other Ambulatory Visit: Payer: Self-pay

## 2022-04-08 ENCOUNTER — Ambulatory Visit (INDEPENDENT_AMBULATORY_CARE_PROVIDER_SITE_OTHER): Payer: Self-pay | Admitting: *Deleted

## 2022-04-08 NOTE — Telephone Encounter (Signed)
FYI  Contacted pt to schedule an appt pt didn't answer lvm

## 2022-04-08 NOTE — Telephone Encounter (Signed)
  Chief Complaint: elevated BP per disability nurse per patient report Symptoms: reports elevated BP and told to go to ED by disability nurse. C/o lightheadedness upon awakening at times. No sx reported now. Frequency: Thursday 04/04/22 Pertinent Negatives: Patient denies headache no weakness of either side of body no dizziness. Unable to check BP Disposition: []$ ED /[x]$ Urgent Care (no appt availability in office) / []$ Appointment(In office/virtual)/ []$  [facility]/ []$ Home Care/ []$ Refused Recommended Disposition /[x]$ [facility]/ []$  Follow-up with PCP Additional Notes:   Recommended patient go to [facility] and if not able to get BP checked go to pharmacy. If BP remains high call back or go to ED as recommended last week by disability nurse. Please advise no appt until 3/11     Reason for Disposition  [id number] Systolic BP  >= [id number] OR Diastolic >= 123456 AND [id number] having NO cardiac or neurologic symptoms  Answer Assessment - Initial Assessment Questions 1. BLOOD PRESSURE: "What is the blood pressure?" "Did you take at least two measurements 5 minutes apart?"     Unable to check  2. ONSET: "When did you take your blood pressure?"     BP checked at area where disability completed 3. HOW: "How did you take your blood pressure?" (e.g., automatic home BP monitor, visiting nurse)     A nurse checked BP no value given . Patient forgot but told BP high and f/u in ED.  4. HISTORY: "Do you have a history of high blood pressure?"     yes 5. MEDICINES: "Are you taking any medicines for blood pressure?" "Have you missed any doses recently?"     Medications ordered 6. OTHER SYMPTOMS: "Do you have any symptoms?" (e.g., blurred vision, chest pain, difficulty breathing, headache, weakness)     Lightheaded when wakes up at times. Denies sx now  7. PREGNANCY: "Is there any chance you are pregnant?" "When was your last menstrual period?"     na  Protocols used: Blood Pressure - High-A-AH

## 2022-04-12 ENCOUNTER — Other Ambulatory Visit: Payer: Self-pay

## 2022-04-29 ENCOUNTER — Ambulatory Visit (INDEPENDENT_AMBULATORY_CARE_PROVIDER_SITE_OTHER): Payer: Self-pay | Admitting: Primary Care

## 2022-07-09 ENCOUNTER — Telehealth (INDEPENDENT_AMBULATORY_CARE_PROVIDER_SITE_OTHER): Payer: Self-pay | Admitting: Primary Care

## 2022-07-09 NOTE — Telephone Encounter (Signed)
Spoke to pt and got him booked for June

## 2022-07-11 ENCOUNTER — Other Ambulatory Visit: Payer: Self-pay

## 2022-07-18 ENCOUNTER — Ambulatory Visit (INDEPENDENT_AMBULATORY_CARE_PROVIDER_SITE_OTHER): Payer: Self-pay | Admitting: Primary Care

## 2022-07-29 ENCOUNTER — Ambulatory Visit (INDEPENDENT_AMBULATORY_CARE_PROVIDER_SITE_OTHER): Payer: Self-pay | Admitting: Primary Care

## 2022-08-09 ENCOUNTER — Emergency Department (HOSPITAL_COMMUNITY): Payer: BLUE CROSS/BLUE SHIELD

## 2022-08-09 ENCOUNTER — Other Ambulatory Visit: Payer: Self-pay

## 2022-08-09 ENCOUNTER — Encounter (HOSPITAL_COMMUNITY): Payer: Self-pay | Admitting: *Deleted

## 2022-08-09 ENCOUNTER — Encounter (HOSPITAL_COMMUNITY): Payer: Self-pay

## 2022-08-09 ENCOUNTER — Ambulatory Visit (HOSPITAL_COMMUNITY)
Admission: EM | Admit: 2022-08-09 | Discharge: 2022-08-09 | Disposition: A | Discharge status: Home / Self Care | Payer: BLUE CROSS/BLUE SHIELD | Attending: Emergency Medicine | Admitting: Emergency Medicine

## 2022-08-09 ENCOUNTER — Inpatient Hospital Stay (HOSPITAL_COMMUNITY)
Admission: EM | Admit: 2022-08-09 | Discharge: 2022-08-12 | DRG: 607 | Disposition: A | Discharge status: Home / Self Care | Payer: BLUE CROSS/BLUE SHIELD | Attending: Internal Medicine | Admitting: Internal Medicine

## 2022-08-09 DIAGNOSIS — Z6841 Body Mass Index (BMI) 40.0 and over, adult: Secondary | ICD-10-CM | POA: Diagnosis not present

## 2022-08-09 DIAGNOSIS — L089 Local infection of the skin and subcutaneous tissue, unspecified: Secondary | ICD-10-CM | POA: Diagnosis not present

## 2022-08-09 DIAGNOSIS — N179 Acute kidney failure, unspecified: Secondary | ICD-10-CM | POA: Diagnosis present

## 2022-08-09 DIAGNOSIS — D649 Anemia, unspecified: Secondary | ICD-10-CM | POA: Diagnosis present

## 2022-08-09 DIAGNOSIS — T148XXA Other injury of unspecified body region, initial encounter: Secondary | ICD-10-CM | POA: Diagnosis not present

## 2022-08-09 DIAGNOSIS — F17210 Nicotine dependence, cigarettes, uncomplicated: Secondary | ICD-10-CM | POA: Diagnosis present

## 2022-08-09 DIAGNOSIS — L98499 Non-pressure chronic ulcer of skin of other sites with unspecified severity: Secondary | ICD-10-CM | POA: Diagnosis present

## 2022-08-09 DIAGNOSIS — Z79899 Other long term (current) drug therapy: Secondary | ICD-10-CM

## 2022-08-09 DIAGNOSIS — I739 Peripheral vascular disease, unspecified: Secondary | ICD-10-CM | POA: Diagnosis present

## 2022-08-09 DIAGNOSIS — E8809 Other disorders of plasma-protein metabolism, not elsewhere classified: Secondary | ICD-10-CM | POA: Diagnosis present

## 2022-08-09 DIAGNOSIS — B879 Myiasis, unspecified: Secondary | ICD-10-CM

## 2022-08-09 DIAGNOSIS — R9431 Abnormal electrocardiogram [ECG] [EKG]: Secondary | ICD-10-CM | POA: Diagnosis present

## 2022-08-09 DIAGNOSIS — R739 Hyperglycemia, unspecified: Secondary | ICD-10-CM | POA: Diagnosis present

## 2022-08-09 DIAGNOSIS — I10 Essential (primary) hypertension: Secondary | ICD-10-CM | POA: Diagnosis present

## 2022-08-09 DIAGNOSIS — B871 Wound myiasis: Secondary | ICD-10-CM | POA: Diagnosis present

## 2022-08-09 DIAGNOSIS — L0889 Other specified local infections of the skin and subcutaneous tissue: Secondary | ICD-10-CM | POA: Diagnosis present

## 2022-08-09 DIAGNOSIS — E876 Hypokalemia: Secondary | ICD-10-CM

## 2022-08-09 LAB — CBC WITH DIFFERENTIAL/PLATELET
Abs Immature Granulocytes: 0.02 10*3/uL (ref 0.00–0.07)
Basophils Absolute: 0 10*3/uL (ref 0.0–0.1)
Basophils Relative: 1 %
Eosinophils Absolute: 0.4 10*3/uL (ref 0.0–0.5)
Eosinophils Relative: 5 %
HCT: 39.9 % (ref 39.0–52.0)
Hemoglobin: 12.3 g/dL — ABNORMAL LOW (ref 13.0–17.0)
Immature Granulocytes: 0 %
Lymphocytes Relative: 32 %
Lymphs Abs: 2.8 10*3/uL (ref 0.7–4.0)
MCH: 24.7 pg — ABNORMAL LOW (ref 26.0–34.0)
MCHC: 30.8 g/dL (ref 30.0–36.0)
MCV: 80.1 fL (ref 80.0–100.0)
Monocytes Absolute: 0.6 10*3/uL (ref 0.1–1.0)
Monocytes Relative: 7 %
Neutro Abs: 4.8 10*3/uL (ref 1.7–7.7)
Neutrophils Relative %: 55 %
Platelets: 266 10*3/uL (ref 150–400)
RBC: 4.98 MIL/uL (ref 4.22–5.81)
RDW: 16.8 % — ABNORMAL HIGH (ref 11.5–15.5)
WBC: 8.6 10*3/uL (ref 4.0–10.5)
nRBC: 0 % (ref 0.0–0.2)

## 2022-08-09 LAB — BASIC METABOLIC PANEL
Anion gap: 8 (ref 5–15)
BUN: 16 mg/dL (ref 6–20)
CO2: 33 mmol/L — ABNORMAL HIGH (ref 22–32)
Calcium: 8.6 mg/dL — ABNORMAL LOW (ref 8.9–10.3)
Chloride: 98 mmol/L (ref 98–111)
Creatinine, Ser: 1.51 mg/dL — ABNORMAL HIGH (ref 0.61–1.24)
GFR, Estimated: >60 mL/min (ref >60)
Glucose, Bld: 118 mg/dL — ABNORMAL HIGH (ref 70–99)
Potassium: 2.6 mmol/L — CL (ref 3.5–5.1)
Sodium: 139 mmol/L (ref 135–145)

## 2022-08-09 LAB — LACTIC ACID, PLASMA: Lactic Acid, Venous: 1.9 mmol/L (ref 0.5–1.9)

## 2022-08-09 MED ORDER — POLYETHYLENE GLYCOL 3350 17 G PO PACK: 17.0000 g | PACK | Freq: Every day | ORAL | Status: DC | PRN

## 2022-08-09 MED ORDER — SODIUM CHLORIDE 0.9 % IV SOLN
2.0000 g | Freq: Once | INTRAVENOUS | Status: AC
  Administered 2022-08-10: 2 g via INTRAVENOUS
  Filled 2022-08-09: qty 20

## 2022-08-09 MED ORDER — VANCOMYCIN HCL 10 G IV SOLR
2500.0000 mg | Freq: Once | INTRAVENOUS | Status: AC
  Administered 2022-08-10: 2500 mg via INTRAVENOUS
  Filled 2022-08-09: qty 2500

## 2022-08-09 MED ORDER — POTASSIUM CHLORIDE 2 MEQ/ML IV SOLN
INTRAVENOUS | Status: AC
  Filled 2022-08-09 (×3): qty 1000

## 2022-08-09 MED ORDER — VANCOMYCIN HCL IN DEXTROSE 1-5 GM/200ML-% IV SOLN: 1000.0000 mg | Freq: Once | INTRAVENOUS | Status: DC

## 2022-08-09 MED ORDER — CHLORTHALIDONE 50 MG PO TABS: 50.0000 mg | ORAL_TABLET | Freq: Every day | ORAL | Status: DC

## 2022-08-09 MED ORDER — VANCOMYCIN HCL 2000 MG/400ML IV SOLN
2000.0000 mg | Freq: Two times a day (BID) | INTRAVENOUS | Status: DC
  Administered 2022-08-10 – 2022-08-12 (×5): 2000 mg via INTRAVENOUS
  Filled 2022-08-09 (×5): qty 400

## 2022-08-09 MED ORDER — AMLODIPINE BESYLATE 10 MG PO TABS
10.0000 mg | ORAL_TABLET | Freq: Every day | ORAL | Status: DC
  Administered 2022-08-09 – 2022-08-12 (×4): 10 mg via ORAL
  Filled 2022-08-09 (×4): qty 1

## 2022-08-09 MED ORDER — PROCHLORPERAZINE EDISYLATE 10 MG/2ML IJ SOLN: 5.0000 mg | Freq: Four times a day (QID) | INTRAMUSCULAR | Status: DC | PRN

## 2022-08-09 MED ORDER — MELATONIN 5 MG PO TABS: 5.0000 mg | ORAL_TABLET | Freq: Every evening | ORAL | Status: DC | PRN

## 2022-08-09 MED ORDER — AMLODIPINE BESYLATE 5 MG PO TABS: 10.0000 mg | ORAL_TABLET | Freq: Every day | ORAL | Status: DC

## 2022-08-09 MED ORDER — SODIUM CHLORIDE 0.9 % IV SOLN
2.0000 g | INTRAVENOUS | Status: DC
  Administered 2022-08-10 – 2022-08-11 (×2): 2 g via INTRAVENOUS
  Filled 2022-08-09 (×2): qty 20

## 2022-08-09 MED ORDER — POTASSIUM CHLORIDE 10 MEQ/100ML IV SOLN
10.0000 meq | INTRAVENOUS | Status: DC
  Administered 2022-08-10: 10 meq via INTRAVENOUS
  Filled 2022-08-09: qty 100

## 2022-08-09 MED ORDER — OXYCODONE HCL 5 MG PO TABS
5.0000 mg | ORAL_TABLET | Freq: Four times a day (QID) | ORAL | Status: DC | PRN
  Administered 2022-08-10 – 2022-08-12 (×3): 5 mg via ORAL
  Filled 2022-08-09 (×3): qty 1

## 2022-08-09 MED ORDER — VANCOMYCIN HCL IN DEXTROSE 1-5 GM/200ML-% IV SOLN
1000.0000 mg | Freq: Once | INTRAVENOUS | Status: DC
  Filled 2022-08-09: qty 200

## 2022-08-09 MED ORDER — ENOXAPARIN SODIUM 40 MG/0.4ML IJ SOSY
40.0000 mg | PREFILLED_SYRINGE | Freq: Every day | INTRAMUSCULAR | Status: DC
  Administered 2022-08-09: 40 mg via SUBCUTANEOUS
  Filled 2022-08-09: qty 0.4

## 2022-08-09 MED ORDER — HYDRALAZINE HCL 20 MG/ML IJ SOLN
5.0000 mg | Freq: Four times a day (QID) | INTRAMUSCULAR | Status: DC | PRN
  Administered 2022-08-10 – 2022-08-12 (×2): 5 mg via INTRAVENOUS
  Filled 2022-08-09 (×2): qty 1

## 2022-08-09 NOTE — ED Provider Notes (Signed)
[place] AT [facility] [address] Provider Note   CSN: [id number] Arrival date & time: 08/09/22  1853     History  Chief Complaint  Patient presents with   Wound Infection    CRASH LONTZ is a 32 y.o. adult with PMHx HTN, PVD who presents to ED concerned for wound infection. Patient has had wound on posterior right calf for 4 years. Patient was picking at scab earlier today when he noticed a maggot. Patient presented to UC who referred patient to ED.  Patient denies fever, extremity paresthesias/numbness, chest pain, dyspnea, abdominal pain, nausea, vomiting.  HPI     Home Medications Prior to Admission medications   Medication Sig Start Date End Date Taking? Authorizing Provider  amLODipine (NORVASC) 10 MG tablet Take 1 tablet (10 mg total) by mouth daily. 11/07/21   Grayce Sessions, NP  chlorthalidone (HYGROTON) 50 MG tablet Take 1 tablet (50 mg total) by mouth daily. 11/07/21   Grayce Sessions, NP  furosemide (LASIX) 40 MG tablet Take 2 tablets (80 mg total) by mouth daily. 11/07/21 08/10/22  Grayce Sessions, NP  losartan (COZAAR) 50 MG tablet Take 1 tablet (50 mg total) by mouth daily. 11/07/21   Grayce Sessions, NP      Allergies    Patient has no known allergies.    Review of Systems   Review of Systems  Skin:  Positive for wound.    Physical Exam Updated Vital Signs BP (!) 151/106 (BP Location: Right Arm)   Pulse 86   Temp 97.9 F (36.6 C) (Oral)   Resp 16   SpO2 94%  Physical Exam Vitals and nursing note reviewed.  Constitutional:      General: He is not in acute distress.    Appearance: He is not ill-appearing or toxic-appearing.  HENT:     Head: Normocephalic and atraumatic.     Mouth/Throat:     Mouth: Mucous membranes are moist.  Eyes:     General: No scleral icterus.       Right eye: No discharge.        Left eye: No discharge.     Conjunctiva/sclera: Conjunctivae normal.  Cardiovascular:     Rate and  Rhythm: Normal rate and regular rhythm.     Pulses: Normal pulses.     Heart sounds: Normal heart sounds. No murmur heard. Pulmonary:     Effort: Pulmonary effort is normal. No respiratory distress.     Breath sounds: Normal breath sounds. No wheezing, rhonchi or rales.  Abdominal:     General: Abdomen is flat. Bowel sounds are normal. There is no distension.     Palpations: Abdomen is soft. There is no mass.     Tenderness: There is no abdominal tenderness.  Skin:    General: Skin is warm and dry.     Findings: No rash.     Comments: There are several small objects moving in the upper part of the wound, likely maggots. Picture included in note. Severe swelling of BL legs without pitting edema.  Neurological:     General: No focal deficit present.     Mental Status: He is alert. Mental status is at baseline.  Psychiatric:        Mood and Affect: Mood normal.     ED Results / Procedures / Treatments   Labs (all labs ordered are listed, but only abnormal results are displayed) Labs Reviewed  CBC WITH DIFFERENTIAL/PLATELET - Abnormal; Notable for  the following components:      Result Value   Hemoglobin 12.3 (*)    MCH 24.7 (*)    RDW 16.8 (*)    All other components within normal limits  CULTURE, BLOOD (ROUTINE X 2)  CULTURE, BLOOD (ROUTINE X 2)  LACTIC ACID, PLASMA  BASIC METABOLIC PANEL  LACTIC ACID, PLASMA    EKG None  Radiology DG Tibia/Fibula Right  Result Date: 08/09/2022 CLINICAL DATA:  Wound infection EXAM: RIGHT TIBIA AND FIBULA - 2 VIEW COMPARISON:  09/16/2021 FINDINGS: No fracture or malalignment. Diffuse soft tissue edema. No soft tissue emphysema. No periostitis or osseous destructive change. IMPRESSION: Diffuse soft tissue edema. No acute osseous abnormality. Electronically Signed   By: Jasmine Pang M.D.   On: 08/09/2022 21:42    Procedures Procedures    Medications Ordered in ED Medications  cefTRIAXone (ROCEPHIN) 2 g in sodium chloride 0.9 % 100 mL  IVPB (has no administration in time range)    ED Course/ Medical Decision Making/ A&P                             Medical Decision Making Amount and/or Complexity of Data Reviewed Labs: ordered. Radiology: ordered.   This patient presents to the ED for concern of wound infection, this involves an extensive number of treatment options, and is a complaint that carries with it a high risk of complications and morbidity.  The differential diagnosis includes irritant contact dermatitis, DRESS, cellulitis, abscess, NV compromise, osteomylitis   Co morbidities that complicate the patient evaluation  HTN, PVD   Lab Tests:  I Ordered, and personally interpreted labs.  The pertinent results include:   -CBC: No concern for anemia or leukocytosis -BMP: pending -LA: pending -Blood culture: pending   Imaging Studies ordered:  I ordered imaging studies including  -R leg xray: To assess for wound complications including osteomyelitis I independently visualized and interpreted imaging I agree with the radiologist interpretation   Cardiac Monitoring: / EKG:  The patient was maintained on a cardiac monitor.  I personally viewed and interpreted the cardiac monitored which showed an underlying rhythm of: Sinus rhythm without acute ST changes or arrhythmias   Consultations Obtained:  I requested consultation with Hospitalist,  and discussed lab and imaging findings as well as pertinent plan - they agree to admit patient for IV ABX - requesting Vanc and Rocephin   Problem List / ED Course / Critical interventions / Medication management  Patient presents with wound infection to posterior right calf.  Patient stating that he visualized a maggot at home.  Physical exam concerning for several small objects moving in the upper left wound - concerning for maggots.  Patient afebrile with stable vitals.  Patient denying any complaints other than wound pain.  Starting infectious workup.  CBC  without leukocytosis. Given patient's severe wound infection-consulting hospitalist for admission for IV ABX. Dr. Margo Aye admitting provider. I have reviewed the patients home medicines and have made adjustments as needed  Ddx: these are considered less likely due to history of present illness and physical exam - irritant contact dermatitis: patient without irritant exposure, patient playful on exam -DRESS: patient afebrile, stable vitals and playful on exam -atopic dermatitis: rash diffuse, not confined to flexural areas -anaphylaxis: patient playful on exam; O2 100%, no nasal flaring or troubles breathing -SJS/TEN: negative Nikolsky sign    Social Determinants of Health:  none  Final Clinical Impression(s) / ED Diagnoses Final diagnoses:  Wound infection    Rx / DC Orders ED Discharge Orders     None         Margarita Rana 08/09/22 2155    Tegeler, Canary Brim, MD 08/10/22 [phone]

## 2022-08-09 NOTE — ED Triage Notes (Addendum)
Pt states he has a wound on the back of his right leg that has been there for years. He states that he has been using a bandage on it and when he went to check it today he had maggots on it, he showered and then more maggots appeared. He states he clipped some skin away before coming but he is worried. He states he hasn't seen wound care before. He is worried the maggots are under the skin coming out.

## 2022-08-09 NOTE — ED Triage Notes (Signed)
Pt states he was sent here from urgent care for maggots in a wound that he has had on his right leg for years.  States he thinks there are still maggots in the wound, some did wash out in the shower.  Has not been seeing wound care/clinic at this time.

## 2022-08-09 NOTE — Progress Notes (Signed)
Pharmacy Antibiotic Note  Jared Mccoy is a 32 y.o. adult admitted on 08/09/2022 with  wound infection .  Pharmacy has been consulted for vancomycin dosing. Note small AKI, Scr 1.51 baseline around 1.2.   Plan: Give IV Vancomycin 2500mg  x 1 for loading dose, followed by IV Vancomycin 2000mg  every 12 hours for eAUC489. Scr: 1.51, Vd: 0.5.  Ceftriaxone per MD.  Follow culture data for de-escalation.  Monitor renal function for dose adjustments as indicated.    Height: 6' (182.9 cm) Weight: (!) 241.3 kg (531 lb 15.5 oz) IBW/kg (Calculated) : 77.6  Temp (24hrs), Avg:98.1 F (36.7 C), Min:97.9 F (36.6 C), Max:98.2 F (36.8 C)  Recent Labs  Lab 08/09/22 2106  WBC 8.6  CREATININE 1.51*  LATICACIDVEN 1.9    Estimated Creatinine Clearance (by C-G formula based on SCr of 1.51 mg/dL (H)) Male: 161.0 mL/min (A) Male: 140.8 mL/min (A)    No Known Allergies  Thank you for allowing pharmacy to be a part of this patient's care.  Estill Batten, PharmD, BCCCP  08/09/2022 11:18 PM

## 2022-08-09 NOTE — ED Notes (Signed)
Lab called to report critical lab of 2.6 Potassium for patient. PA notified.

## 2022-08-09 NOTE — ED Notes (Signed)
ED TO INPATIENT HANDOFF REPORT  ED Nurse Name and Phone #:   S Name/Age/Gender Jared Mccoy 32 y.o. adult Room/Bed: 003C/003C  Code Status   Code Status: Prior  Home/SNF/Other Home Patient oriented to: self, place, time, and situation Is this baseline? Yes   Triage Complete: Triage complete  Chief Complaint Wound infection [T14.8XXA, L08.9]  Triage Note Pt states he was sent here from urgent care for maggots in a wound that he has had on his right leg for years.  States he thinks there are still maggots in the wound, some did wash out in the shower.  Has not been seeing wound care/clinic at this time.    Allergies No Known Allergies  Level of Care/Admitting Diagnosis ED Disposition     ED Disposition  Admit   Condition  --   Comment  Hospital Area: [facility] [100100]  Level of Care: Med-Surg [16]  May admit patient to [facility] or [facility] if equivalent level of care is available:: Yes  Covid Evaluation: Asymptomatic - no recent exposure (last 10 days) testing not required  Diagnosis: Wound infection [161096]  Admitting Physician: Darlin Drop [[id number]]  Attending Physician: Darlin Drop [[id number]]  Certification:: I certify this patient will need inpatient services for at least 2 midnights  Estimated Length of Stay: 2          B Medical/Surgery History Past Medical History:  Diagnosis Date   Hypertension    not on medication   Obese    Peripheral vascular disease (HCC)    edema in legs    Past Surgical History:  Procedure Laterality Date   MANDIBULAR HARDWARE REMOVAL N/A 12/27/2016   Procedure: MANDIBULAR HARDWARE REMOVAL;  Surgeon: Newman Pies, MD;  Location: [facility] OR;  Service: ENT;  Laterality: N/A;   OPEN REDUCTION INTERNAL FIXATION (ORIF) DISTAL RADIAL FRACTURE Left 11/10/2016   Procedure: OPEN REDUCTION INTERNAL FIXATION (ORIF) DISTAL RADIAL FRACTURE;  Surgeon: Mack Hook, MD;  Location: [facility] OR;  Service: Orthopedics;   Laterality: Left;   ORIF MANDIBULAR FRACTURE Right 11/10/2016   Procedure: OPEN REDUCTION INTERNAL FIXATION (ORIF) MANDIBULAR FRACTURE, Ear Laceration Repair;  Surgeon: Newman Pies, MD;  Location: [facility] OR;  Service: ENT;  Laterality: Right;     A IV Location/Drains/Wounds Patient Lines/Drains/Airways Status     Active Line/Drains/Airways     Name Placement date Placement time Site Days   Peripheral IV 08/09/22 18 G Anterior;Distal;Right;Upper Arm 08/09/22  2113  Arm  less than 1   Wound / Incision (Open or Dehisced) 09/16/21 Laceration Knee Anterior;Right 3 inch Laceration 09/16/21  0215  Knee  327            Intake/Output Last 24 hours No intake or output data in the 24 hours ending 08/09/22 2204  Labs/Imaging Results for orders placed or performed during the hospital encounter of 08/09/22 (from the past 48 hour(s))  Basic metabolic panel     Status: Abnormal   Collection Time: 08/09/22  9:06 PM  Result Value Ref Range   Sodium 139 135 - 145 mmol/L   Potassium 2.6 (LL) 3.5 - 5.1 mmol/L    Comment: CRITICAL RESULT CALLED TO, READ BACK BY AND VERIFIED WITH Carmie End RN 08/09/22 2154 [place]   Chloride 98 98 - 111 mmol/L   CO2 33 (H) 22 - 32 mmol/L   Glucose, Bld 118 (H) 70 - 99 mg/dL    Comment: Glucose reference range applies only to samples taken after  fasting for at least 8 hours.   BUN 16 6 - 20 mg/dL   Creatinine, Ser 6.04 (H) 0.61 - 1.24 mg/dL   Calcium 8.6 (L) 8.9 - 10.3 mg/dL   GFR, Estimated >54 >09 mL/min    Comment: (NOTE) Calculated using the CKD-EPI Creatinine Equation (2021)    Anion gap 8 5 - 15    Comment: Performed at [facility], [address], [place], [place] [address]  CBC with Differential     Status: Abnormal   Collection Time: 08/09/22  9:06 PM  Result Value Ref Range   WBC 8.6 4.0 - 10.5 K/uL   RBC 4.98 4.22 - 5.81 MIL/uL   Hemoglobin 12.3 (L) 13.0 - 17.0 g/dL   HCT 47.8 29.5 - 62.1 %   MCV 80.1 80.0 - 100.0 fL   MCH 24.7 (L) 26.0 -  34.0 pg   MCHC 30.8 30.0 - 36.0 g/dL   RDW 30.8 (H) 65.7 - 84.6 %   Platelets 266 150 - 400 K/uL   nRBC 0.0 0.0 - 0.2 %   Neutrophils Relative % 55 %   Neutro Abs 4.8 1.7 - 7.7 K/uL   Lymphocytes Relative 32 %   Lymphs Abs 2.8 0.7 - 4.0 K/uL   Monocytes Relative 7 %   Monocytes Absolute 0.6 0.1 - 1.0 K/uL   Eosinophils Relative 5 %   Eosinophils Absolute 0.4 0.0 - 0.5 K/uL   Basophils Relative 1 %   Basophils Absolute 0.0 0.0 - 0.1 K/uL   Immature Granulocytes 0 %   Abs Immature Granulocytes 0.02 0.00 - 0.07 K/uL    Comment: Performed at [facility], [address] [place], [place] [address]  Lactic acid, plasma     Status: None   Collection Time: 08/09/22  9:06 PM  Result Value Ref Range   Lactic Acid, Venous 1.9 0.5 - 1.9 mmol/L    Comment: Performed at [facility], [address], [place], [place] [address]   DG Tibia/Fibula Right  Result Date: 08/09/2022 CLINICAL DATA:  Wound infection EXAM: RIGHT TIBIA AND FIBULA - 2 VIEW COMPARISON:  09/16/2021 FINDINGS: No fracture or malalignment. Diffuse soft tissue edema. No soft tissue emphysema. No periostitis or osseous destructive change. IMPRESSION: Diffuse soft tissue edema. No acute osseous abnormality. Electronically Signed   By: Jasmine Pang M.D.   On: 08/09/2022 21:42    Pending Labs Unresulted Labs (From admission, onward)     Start     Ordered   08/09/22 2050  Lactic acid, plasma  Now then every 2 hours,   R (with STAT occurrences)      08/09/22 2049   08/09/22 2049  Culture, blood (routine x 2)  BLOOD CULTURE X 2,   R (with STAT occurrences)      08/09/22 2049            Vitals/Pain Today's Vitals   08/09/22 1908 08/09/22 1909  BP:  (!) 151/106  Pulse:  86  Resp:  16  Temp:  97.9 F (36.6 C)  TempSrc:  Oral  SpO2:  94%  PainSc: 7      Isolation Precautions No active isolations  Medications Medications  cefTRIAXone (ROCEPHIN) 2 g in sodium chloride 0.9 % 100 mL IVPB (has no  administration in time range)  potassium chloride 10 mEq in 100 mL IVPB (has no administration in time range)    Mobility walks     Focused Assessments    R  Recommendations: See Admitting Provider Note  Report given to:   Additional Notes:

## 2022-08-09 NOTE — ED Provider Notes (Signed)
[facility]    CSN: [id number] Arrival date & time: 08/09/22  1743      History   Chief Complaint Chief Complaint  Patient presents with   Wound Check    HPI Jared Mccoy is a 32 y.o. adult.  Here with wound on the posterior right leg for 5 years However recently the wound opened back up. He had some pus drainage today. Then noticed some maggots in the wound. He tried to shower and rinse them out but thinks they are still there  He has never been to wound care.   Not having fever  Past Medical History:  Diagnosis Date   Hypertension    not on medication   Obese    Peripheral vascular disease (HCC)    edema in legs     Patient Active Problem List   Diagnosis Date Noted   Multiple trauma 11/10/2016    Past Surgical History:  Procedure Laterality Date   MANDIBULAR HARDWARE REMOVAL N/A 12/27/2016   Procedure: MANDIBULAR HARDWARE REMOVAL;  Surgeon: Newman Pies, MD;  Location: [facility] OR;  Service: ENT;  Laterality: N/A;   OPEN REDUCTION INTERNAL FIXATION (ORIF) DISTAL RADIAL FRACTURE Left 11/10/2016   Procedure: OPEN REDUCTION INTERNAL FIXATION (ORIF) DISTAL RADIAL FRACTURE;  Surgeon: Mack Hook, MD;  Location: [facility] OR;  Service: Orthopedics;  Laterality: Left;   ORIF MANDIBULAR FRACTURE Right 11/10/2016   Procedure: OPEN REDUCTION INTERNAL FIXATION (ORIF) MANDIBULAR FRACTURE, Ear Laceration Repair;  Surgeon: Newman Pies, MD;  Location: [facility] OR;  Service: ENT;  Laterality: Right;    OB History   No obstetric history on file.      Home Medications    Prior to Admission medications   Medication Sig Start Date End Date Taking? Authorizing Provider  amLODipine (NORVASC) 10 MG tablet Take 1 tablet (10 mg total) by mouth daily. 11/07/21  Yes Grayce Sessions, NP  chlorthalidone (HYGROTON) 50 MG tablet Take 1 tablet (50 mg total) by mouth daily. 11/07/21  Yes Grayce Sessions, NP  furosemide (LASIX) 40 MG tablet Take 2 tablets (80 mg total) by mouth daily.  11/07/21 08/10/22 Yes Grayce Sessions, NP  losartan (COZAAR) 50 MG tablet Take 1 tablet (50 mg total) by mouth daily. 11/07/21  Yes Grayce Sessions, NP    Family History History reviewed. No pertinent family history.  Social History Social History   Tobacco Use   Smoking status: Every Day    Packs/day: 0.50    Years: 20.00    Additional pack years: 0.00    Total pack years: 10.00    Types: Cigarettes   Smokeless tobacco: Never  Vaping Use   Vaping Use: Never used  Substance Use Topics   Alcohol use: Yes    Comment: 12/26/16- 1 fifth a week   Drug use: No     Allergies   Patient has no known allergies.   Review of Systems Review of Systems As per HPI  Physical Exam Triage Vital Signs ED Triage Vitals  Enc Vitals Group     BP 08/09/22 1753 (!) 156/95     Pulse Rate 08/09/22 1753 89     Resp 08/09/22 1753 18     Temp 08/09/22 1753 98.2 F (36.8 C)     Temp Source 08/09/22 1753 Oral     SpO2 08/09/22 1753 96 %     Weight --      Height --      Head Circumference --  Peak Flow --      Pain Score 08/09/22 1750 7     Pain Loc --      Pain Edu? --      Excl. in GC? --    No data found.  Updated Vital Signs BP (!) 156/95 (BP Location: Right Arm)   Pulse 89   Temp 98.2 F (36.8 C) (Oral)   Resp 18   SpO2 96%   Physical Exam Vitals and nursing note reviewed.  Constitutional:      General: He is not in acute distress.    Appearance: He is obese. He is not ill-appearing.  HENT:     Mouth/Throat:     Pharynx: Oropharynx is clear.  Cardiovascular:     Rate and Rhythm: Normal rate and regular rhythm.     Pulses: Normal pulses.     Comments: Lower extremities with severe edema. Skin is tense and tender with light palpation  Pulmonary:     Effort: Pulmonary effort is normal.  Musculoskeletal:        General: Tenderness present.     Cervical back: Normal range of motion.     Right lower leg: Edema present.     Left lower leg: Edema present.   Skin:    Capillary Refill: Capillary refill takes less than 2 seconds.     Findings: Erythema and wound present.     Comments: Large wound on the posterior RLE. Pus drainage and erythema noted. There are several small objects moving in the upper part of the wound, likely maggots. Area is tender.  Left lower extremity with similar wound size but crusted over.  Neurological:     Mental Status: He is alert and oriented to person, place, and time.    UC Treatments / Results  Labs (all labs ordered are listed, but only abnormal results are displayed) Labs Reviewed - No data to display  EKG  Radiology No results found.  Procedures Procedures   Medications Ordered in UC Medications - No data to display  Initial Impression / Assessment and Plan / UC Course  I have reviewed the triage vital signs and the nursing notes.  Pertinent labs & imaging results that were available during my care of the patient were reviewed by me and considered in my medical decision making (see chart for details).  Afebrile.  Patient is obese with chronic edema of the lower extremities, non-healing wounds bilaterally with acute worsening of right leg wound. Infected with concern for maggot infestation.  Discussed this will likely need to be debrided and/or flushed out. Urgent care does not have resources or ability to perform this. I would like him to be seen in the emergency department for advanced wound care. I have placed internal referral to [facility] but advised he be seen tonight. Patient agrees to plan and is going to the ED via POV  Final Clinical Impressions(s) / UC Diagnoses   Final diagnoses:  Infected wound  Infestation by maggots     Discharge Instructions      Sent to ED for higher level of care     ED Prescriptions   None    PDMP not reviewed this encounter.   Kathrine Haddock 08/09/22 1610

## 2022-08-09 NOTE — Discharge Instructions (Signed)
Sent to ED for higher level of care 

## 2022-08-09 NOTE — H&P (Addendum)
History and Physical  Jared Mccoy:[id number] DOB: 11/08/1990 DOA: 08/09/2022  Referring physician: Valrie Hart, PA-EDP  PCP: Nicki Reaper  Outpatient Specialists: None Patient coming from: Home through urgent care.  Chief Complaint: Right lower extremity wound infection with maggots.  HPI: Jared Mccoy is a 32 y.o. adult with medical history significant for severe morbid obesity, hypertension, chronic edema of bilateral lower extremities, chronic nonhealing wounds bilaterally with acute worsening of right leg wound, chronic right leg wound for the past 4 to 5 years, who initially presented to urgent care after seeing maggots coming out of his wound on the back of his right leg.  He washed off the wound but continued to see maggots coming out of it.  Associated with pain from his wounds.  Denies subjective fevers or chills.  He decided to come to urgent care to be evaluated.  The patient does not follow with wound care specialist or clinic.  States he has been going to urgent cares and emergency departments for his wound care.  He presented to [facility] ED today at the recommendation of urgent care provider.  In the ED, he is alert, in no acute distress.  He is afebrile with no leukocytosis.  Posterior right lower extremity wound noted with pus draining.  ED Course: Tmax 97.9.  BP of 207/77, pulse 74, respiratory 22, saturation 92% on room air.  Lab studies notable for serum potassium 2.6, glucose 119, creatinine 1.51 with GFR greater than 60, lactic acid 1.9.  Review of Systems: Review of systems as noted in the HPI. All other systems reviewed and are negative.   Past Medical History:  Diagnosis Date   Hypertension    not on medication   Obese    Peripheral vascular disease (HCC)    edema in legs    Past Surgical History:  Procedure Laterality Date   MANDIBULAR HARDWARE REMOVAL N/A 12/27/2016   Procedure: MANDIBULAR HARDWARE REMOVAL;  Surgeon:  Newman Pies, MD;  Location: [facility] OR;  Service: ENT;  Laterality: N/A;   OPEN REDUCTION INTERNAL FIXATION (ORIF) DISTAL RADIAL FRACTURE Left 11/10/2016   Procedure: OPEN REDUCTION INTERNAL FIXATION (ORIF) DISTAL RADIAL FRACTURE;  Surgeon: Mack Hook, MD;  Location: [facility] OR;  Service: Orthopedics;  Laterality: Left;   ORIF MANDIBULAR FRACTURE Right 11/10/2016   Procedure: OPEN REDUCTION INTERNAL FIXATION (ORIF) MANDIBULAR FRACTURE, Ear Laceration Repair;  Surgeon: Newman Pies, MD;  Location: [facility] OR;  Service: ENT;  Laterality: Right;    Social History:  reports that he has been smoking cigarettes. He has a 10.00 pack-year smoking history. He has never used smokeless tobacco. He reports current alcohol use. He reports that he does not use drugs.   No Known Allergies  Family history: None reported.  Prior to Admission medications   Medication Sig Start Date End Date Taking? Authorizing Provider  amLODipine (NORVASC) 10 MG tablet Take 1 tablet (10 mg total) by mouth daily. 11/07/21   Grayce Sessions, NP  chlorthalidone (HYGROTON) 50 MG tablet Take 1 tablet (50 mg total) by mouth daily. 11/07/21   Grayce Sessions, NP  furosemide (LASIX) 40 MG tablet Take 2 tablets (80 mg total) by mouth daily. 11/07/21 08/10/22  Grayce Sessions, NP  losartan (COZAAR) 50 MG tablet Take 1 tablet (50 mg total) by mouth daily. 11/07/21   Grayce Sessions, NP    Physical Exam: BP (!) 151/106 (BP Location: Right Arm)   Pulse 86   Temp 97.9 F (  36.6 C) (Oral)   Resp 16   SpO2 94%   General: 32 y.o. year-old adult well developed well nourished in no acute distress.  Alert and oriented x3. Cardiovascular: Regular rate and rhythm with no rubs or gallops.  No thyromegaly or JVD noted.  Chronic lower extremity edema, thickened skin with concern for lymphedema. Respiratory: Clear to auscultation with no wheezes or rales. Good inspiratory effort. Abdomen: Soft nontender nondistended with normal bowel sounds x4  quadrants. Muskuloskeletal: No cyanosis, clubbing or edema noted bilaterally Neuro: CN II-XII intact, strength, sensation, reflexes Skin:  Right lower extremity 08/09/2022. Psychiatry: Judgement and insight appear normal. Mood is appropriate for condition and setting          Labs on Admission:  Basic Metabolic Panel: Recent Labs  Lab 08/09/22 2106  NA 139  K 2.6*  CL 98  CO2 33*  GLUCOSE 118*  BUN 16  CREATININE 1.51*  CALCIUM 8.6*   Liver Function Tests: No results for input(s): "AST", "ALT", "ALKPHOS", "BILITOT", "PROT", "ALBUMIN" in the last 168 hours. No results for input(s): "LIPASE", "AMYLASE" in the last 168 hours. No results for input(s): "AMMONIA" in the last 168 hours. CBC: Recent Labs  Lab 08/09/22 2106  WBC 8.6  NEUTROABS 4.8  HGB 12.3*  HCT 39.9  MCV 80.1  PLT 266   Cardiac Enzymes: No results for input(s): "CKTOTAL", "CKMB", "CKMBINDEX", "TROPONINI" in the last 168 hours.  BNP (last 3 results) No results for input(s): "BNP" in the last 8760 hours.  ProBNP (last 3 results) No results for input(s): "PROBNP" in the last 8760 hours.  CBG: No results for input(s): "GLUCAP" in the last 168 hours.  Radiological Exams on Admission: DG Tibia/Fibula Right  Result Date: 08/09/2022 CLINICAL DATA:  Wound infection EXAM: RIGHT TIBIA AND FIBULA - 2 VIEW COMPARISON:  09/16/2021 FINDINGS: No fracture or malalignment. Diffuse soft tissue edema. No soft tissue emphysema. No periostitis or osseous destructive change. IMPRESSION: Diffuse soft tissue edema. No acute osseous abnormality. Electronically Signed   By: Jasmine Pang M.D.   On: 08/09/2022 21:42    EKG: I independently viewed the EKG done and my findings are as followed: Sinus rhythm rate of 80.  Nonspecific ST-T changes.  QTc 530.  Assessment/Plan Present on Admission:  Wound infection  Principal Problem:   Wound infection  Right lower extremity wound infection with maggots present, POA Wound care  specialist consulted Continue IV antibiotics initiated in the ED IV vancomycin and Rocephin Follow peripheral blood cultures and MRSA screening test Currently afebrile with no leukocytosis Gentle IV fluid hydration LR KCl 40 mill equivalent at 30 cc/h x 2 days while on IV vancomycin Pain control and bowel regimen  Essential hypertension, BP is not at goal, elevated The patient reports he did not take his blood pressure medications this morning Resume home Norvasc Hold off home diuretics, chlorthalidone, Lasix Hold off home losartan due to elevated creatinine Monitor vital signs.  Hypokalemia in the setting of diuretics use Serum potassium 2.6 Repleted intravenously with IV KCl 10 mill equivalent x 2 doses and LR KCl 40 mill equivalent at 30 cc/h x 2 days. Check magnesium level and repeat BMP in the morning  QTc prolongation Presented with QTc of 530 on twelve-lead EKG Avoid QTc prolonging agents Optimize magnesium and potassium levels. Monitor on remote telemetry in MedSurg unit.  Mild hyperglycemia Serum glucose 118 Monitor for now with repeat BMP  Severe morbid obesity Weight 234 kg Recommend weight loss outpatient with regular physical  activity and healthy dieting.    Time: 75 minutes.    DVT prophylaxis: Subcu Lovenox daily  Code Status: Full code  Family Communication: None at bedside  Disposition Plan: Admitted to MedSurg unit with remote telemetry.  Consults called: Wound care specialist  Admission status: Inpatient status.   Status is: Inpatient The patient requires at least 2 midnights for further evaluation and treatment of present condition.   Darlin Drop MD [facility] Pager [phone]  If 7PM-7AM, please contact night-coverage [email or web address] Password [facility]  08/09/2022, 10:07 PM

## 2022-08-09 NOTE — ED Notes (Signed)
Patient is being discharged from the Urgent Care and sent to the Emergency Department via POV . Per Lurena Joiner Rising, PA-C, patient is in need of higher level of care due to wound. Patient is aware and verbalizes understanding of plan of care.  Vitals:   08/09/22 1753  BP: (!) 156/95  Pulse: 89  Resp: 18  Temp: 98.2 F (36.8 C)  SpO2: 96%

## 2022-08-10 DIAGNOSIS — E876 Hypokalemia: Secondary | ICD-10-CM

## 2022-08-10 LAB — BASIC METABOLIC PANEL
Anion gap: 9 (ref 5–15)
Anion gap: 9 (ref 5–15)
BUN: 16 mg/dL (ref 6–20)
BUN: 15 mg/dL (ref 6–20)
CO2: 32 mmol/L (ref 22–32)
CO2: 30 mmol/L (ref 22–32)
Calcium: 8.3 mg/dL — ABNORMAL LOW (ref 8.9–10.3)
Calcium: 8.5 mg/dL — ABNORMAL LOW (ref 8.9–10.3)
Chloride: 97 mmol/L — ABNORMAL LOW (ref 98–111)
Chloride: 99 mmol/L (ref 98–111)
Creatinine, Ser: 1.36 mg/dL — ABNORMAL HIGH (ref 0.61–1.24)
Creatinine, Ser: 1.31 mg/dL — ABNORMAL HIGH (ref 0.61–1.24)
GFR, Estimated: >60 mL/min (ref >60)
GFR, Estimated: >60 mL/min (ref >60)
Glucose, Bld: 99 mg/dL (ref 70–99)
Glucose, Bld: 85 mg/dL (ref 70–99)
Potassium: 2.5 mmol/L — CL (ref 3.5–5.1)
Potassium: 2.9 mmol/L — ABNORMAL LOW (ref 3.5–5.1)
Sodium: 138 mmol/L (ref 135–145)
Sodium: 138 mmol/L (ref 135–145)

## 2022-08-10 LAB — CBC
HCT: 37.4 % — ABNORMAL LOW (ref 39.0–52.0)
Hemoglobin: 11.6 g/dL — ABNORMAL LOW (ref 13.0–17.0)
MCH: 25.1 pg — ABNORMAL LOW (ref 26.0–34.0)
MCHC: 31 g/dL (ref 30.0–36.0)
MCV: 80.8 fL (ref 80.0–100.0)
Platelets: 226 10*3/uL (ref 150–400)
RBC: 4.63 MIL/uL (ref 4.22–5.81)
RDW: 16.6 % — ABNORMAL HIGH (ref 11.5–15.5)
WBC: 7.6 10*3/uL (ref 4.0–10.5)
nRBC: 0 % (ref 0.0–0.2)

## 2022-08-10 LAB — MAGNESIUM: Magnesium: 2.1 mg/dL (ref 1.7–2.4)

## 2022-08-10 LAB — PHOSPHORUS: Phosphorus: 3.8 mg/dL (ref 2.5–4.6)

## 2022-08-10 LAB — HIV ANTIBODY (ROUTINE TESTING W REFLEX): HIV Screen 4th Generation wRfx: NONREACTIVE

## 2022-08-10 LAB — MRSA NEXT GEN BY PCR, NASAL: MRSA by PCR Next Gen: NOT DETECTED

## 2022-08-10 MED ORDER — POTASSIUM CHLORIDE 10 MEQ/100ML IV SOLN
10.0000 meq | Freq: Once | INTRAVENOUS | Status: AC
  Administered 2022-08-10: 10 meq via INTRAVENOUS
  Filled 2022-08-10: qty 100

## 2022-08-10 MED ORDER — ENOXAPARIN SODIUM 120 MG/0.8ML IJ SOSY
120.0000 mg | PREFILLED_SYRINGE | Freq: Every day | INTRAMUSCULAR | Status: DC
  Administered 2022-08-10 – 2022-08-11 (×2): 120 mg via SUBCUTANEOUS
  Filled 2022-08-10 (×3): qty 0.8

## 2022-08-10 MED ORDER — POTASSIUM CHLORIDE CRYS ER 10 MEQ PO TBCR
30.0000 meq | EXTENDED_RELEASE_TABLET | Freq: Two times a day (BID) | ORAL | Status: AC
  Administered 2022-08-10 (×2): 30 meq via ORAL
  Filled 2022-08-10 (×2): qty 3

## 2022-08-10 NOTE — Progress Notes (Signed)
   08/10/22 0241  Provider Notification  Provider Name/Title A Chavez,NP  Date Provider Notified 08/10/22  Time Provider Notified 0240  Method of Notification Page (secure chat)  Notification Reason Critical Result (critical lab for potassium 2.5, Anthoney Harada, NP informed)  Test performed and critical result CMP  Date Critical Result Received 08/10/22  Time Critical Result Received 0201  Provider response See new orders  Date of Provider Response 08/10/22  Time of Provider Response [phone]

## 2022-08-10 NOTE — Hospital Course (Addendum)
The patient is a 32 year old super morbidly obese African-American male with a past medical history significant for II hypertension, chronic edema of the bilateral extremities, chronic nonhealing wounds bilaterally with acute worsening of the right wound with a chronic leg wound for the last 4 to 5 years.  Patient initially presented to the urgent care after seeing maggots, resumed back of his right leg.  He is going to take a shower and washed off the wound and continues to maggots coming out of it and he continues to have some associated pain.  He denies any subjective chills or fevers.  Because of this he decided to come to urgent care and does not follow with the wound care specialist or clinic.  The patient states that he has been going to urgent care to the emergency department for his wound.  He presented to the [facility] ED at the recommendation of urgent care provider.  In the ED he was alert and in no acute distress and afebrile with no leukocytosis and the posterior extremity wound was noted to have purulent drainage.  Basic blood work was done and showed that he had a hypokalemia.  Case has been discussed with orthopedic surgery who will evaluate Monday if he is still here and case has also been discussed with ID who will evaluate the chart and make further recommendations.    Assessment and Plan:  Right lower extremity wound infection with maggots present, POA -Wound care specialist consulted -Continue IV antibiotics initiated in the ED IV vancomycin and Rocephin -Follow peripheral blood cultures and MRSA screening test -Currently afebrile with no leukocytosis -Gentle IV fluid hydration LR KCl 40 mill equivalent at 30 cc/h x 2 days while on IV vancomycin -Case was discussed with orthopedic surgery and they will evaluate Monday and Dr. Lajoyce Corners see if he is still here  -WBC and Lactic Acid Level Trend: Recent Labs  Lab 08/09/22 2106 08/10/22 0107  WBC 8.6 7.6  LATICACIDVEN 1.9  --   -Also  discussed the case with ID I will review the chart and make further recommendations -C/w Pain control with p.o. oxycodone 5 mg every 6 hours as needed for severe pain and bowel regimen with MiraLAX 17 g p.o. daily as needed   Essential Hypertension, BP is not at goal, elevated -The patient reports he did not take his blood pressure medications this morning -Resume home Norvasc -Hold off home diuretics, chlorthalidone, Lasix given AKI and hypokalemia -Continue to hold home losartan due to elevated creatinine -Continue to monitor vital signs.    QTc prolongation in the setting of Electrolyte Abnormalities  -Presented with QTc of 530 on twelve-lead EKG -Avoid QTc prolonging agents -Optimize magnesium and potassium levels. -Monitor on remote telemetry in MedSurg unit.   Hyperglycemia -Mild. Glucose Level Trend: Recent Labs  Lab 08/09/22 2106 08/10/22 0107  GLUCOSE 118* 99  -Check HbA1c in the AM -Conitor for now with repeat CMP in the AM   Hypokalemia -In the setting of Diuretic Use;  Patient's K+ Level Trend: Recent Labs  Lab 08/09/22 2106 08/10/22 0107  K 2.6* 2.5*  -Replete with IV 10 mEQ x1 and 30 mEQ po BID and is on LR + 40 mEQ Kcl at 30 mL/hr -Continue to Monitor and Replete as Necessary -Repeat CMP in the AM   AKI -Baseline Cr appears to be around 0.9-1.2 BUN/Cr Trend: Recent Labs  Lab 08/09/22 2106 08/10/22 0107  BUN 16 16  CREATININE 1.51* 1.36*  -On IVF with LR at  30 mL/hr x2 days per admitting but may need to increase -Avoid Nephrotoxic Medications, Contrast Dyes, Hypotension and Dehydration to Ensure Adequate Renal Perfusion and will need to Renally Adjust Meds -Continue to Monitor and Trend Renal Function carefully and repeat CMP in the AM   Normocytic Anemia -Hgb/Hct Trend: Recent Labs  Lab 08/09/22 2106 08/10/22 0107  HGB 12.3* 11.6*  HCT 39.9 37.4*  MCV 80.1 80.8  -Check Anemia Panel in the AM -Continue to Monitor for S/Sx of Bleeding; No  overt bleeding noted -Repeat CBC in the AM  Super Morbid Obesity -Complicates overall prognosis and care -Estimated body mass index is 72.15 kg/m as calculated from the following:   Height as of this encounter: 6' (1.829 m).   Weight as of this encounter: 241.3 kg.  -Weight Loss and Dietary Counseling given

## 2022-08-10 NOTE — Progress Notes (Signed)
Patient transfer from ED,alert and oriented,no complain of pain,patient assessment done,patient made comfortable in bed,bed in low position and call light in reach,will continue to monitor.

## 2022-08-10 NOTE — Progress Notes (Signed)
Case discussed over the phone with hospitalist team.  At this time no need for surgical intervention for right posterior calf wound.  However, would recommend close outpatient follow-up with Dr. Aldean Baker.  In the inpatient setting would have wound care nurse visit with him and instruct on daily wet-to-dry dressing changes.  If he continues to be inpatient on Monday we will have Dr. Lajoyce Corners evaluate next week.

## 2022-08-10 NOTE — Progress Notes (Signed)
PROGRESS NOTE    Jared Mccoy  UEA:[id number] DOB: 10/12/1990 DOA: 08/09/2022 PCP: Pete Pelt, PA-C   Brief Narrative:  The patient is a 32 year old super morbidly obese African-American male with a past medical history significant for II hypertension, chronic edema of the bilateral extremities, chronic nonhealing wounds bilaterally with acute worsening of the right wound with a chronic leg wound for the last 4 to 5 years.  Patient initially presented to the urgent care after seeing maggots, resumed back of his right leg.  He is going to take a shower and washed off the wound and continues to maggots coming out of it and he continues to have some associated pain.  He denies any subjective chills or fevers.  Because of this he decided to come to urgent care and does not follow with the wound care specialist or clinic.  The patient states that he has been going to urgent care to the emergency department for his wound.  He presented to the [facility], ED at the recommendation of urgent care provider.  In the ED he was alert and in no acute distress and afebrile with no leukocytosis and the posterior extremity wound was noted to have purulent drainage.  Basic blood work was done and showed that he had a hypokalemia.  Case has been discussed with orthopedic surgery who will evaluate Monday if he is still here and case has also been discussed with ID who will evaluate the chart and make further recommendations.    Assessment and Plan:  Right lower extremity wound infection with maggots present, POA -Wound care specialist consulted -Continue IV antibiotics initiated in the ED IV vancomycin and Rocephin -Follow peripheral blood cultures and MRSA screening test -Currently afebrile with no leukocytosis -Gentle IV fluid hydration LR KCl 40 mill equivalent at 30 cc/h x 2 days while on IV vancomycin -Case was discussed with orthopedic surgery and they will evaluate Monday and Dr. Lajoyce Corners see if he is  still here  -WBC and Lactic Acid Level Trend: Recent Labs  Lab 08/09/22 2106 08/10/22 0107  WBC 8.6 7.6  LATICACIDVEN 1.9  --   -Also discussed the case with ID I will review the chart and make further recommendations -C/w Pain control with p.o. oxycodone 5 mg every 6 hours as needed for severe pain and bowel regimen with MiraLAX 17 g p.o. daily as needed   Essential Hypertension, BP is not at goal, elevated -The patient reports he did not take his blood pressure medications this morning -Resume home Norvasc -Hold off home diuretics, chlorthalidone, Lasix given AKI and hypokalemia -Continue to hold home losartan due to elevated creatinine -Continue to monitor vital signs.    QTc prolongation in the setting of Electrolyte Abnormalities  -Presented with QTc of 530 on twelve-lead EKG -Avoid QTc prolonging agents -Optimize magnesium and potassium levels. -Monitor on remote telemetry in MedSurg unit.   Hyperglycemia -Mild. Glucose Level Trend: Recent Labs  Lab 08/09/22 2106 08/10/22 0107  GLUCOSE 118* 99  -Check HbA1c in the AM -Conitor for now with repeat CMP in the AM   Hypokalemia -In the setting of Diuretic Use;  Patient's K+ Level Trend: Recent Labs  Lab 08/09/22 2106 08/10/22 0107  K 2.6* 2.5*  -Replete with IV 10 mEQ x1 and 30 mEQ po BID and is on LR + 40 mEQ Kcl at 30 mL/hr -Continue to Monitor and Replete as Necessary -Repeat CMP in the AM   AKI -Baseline Cr appears to be around 0.9-1.2  BUN/Cr Trend: Recent Labs  Lab 08/09/22 2106 08/10/22 0107  BUN 16 16  CREATININE 1.51* 1.36*  -On IVF with LR at 30 mL/hr x2 days per admitting but may need to increase -Avoid Nephrotoxic Medications, Contrast Dyes, Hypotension and Dehydration to Ensure Adequate Renal Perfusion and will need to Renally Adjust Meds -Continue to Monitor and Trend Renal Function carefully and repeat CMP in the AM   Normocytic Anemia -Hgb/Hct Trend: Recent Labs  Lab 08/09/22 2106  08/10/22 0107  HGB 12.3* 11.6*  HCT 39.9 37.4*  MCV 80.1 80.8  -Check Anemia Panel in the AM -Continue to Monitor for S/Sx of Bleeding; No overt bleeding noted -Repeat CBC in the AM  Super Morbid Obesity -Complicates overall prognosis and care -Estimated body mass index is 72.15 kg/m as calculated from the following:   Height as of this encounter: 6' (1.829 m).   Weight as of this encounter: 241.3 kg.  -Weight Loss and Dietary Counseling given   DVT prophylaxis: enoxaparin (LOVENOX) injection 40 mg Start: 08/09/22 2230    Code Status: Full Code Family Communication: No family present at bedside  Disposition Plan:  Level of care: Med-Surg Status is: Inpatient Remains inpatient appropriate because: His further clinical improvement and will continue IV antibiotics for now and discussed the case with the specialist and await wound care evaluation   Consultants:  Discussed the case with orthopedic surgery Discussed the case with infectious diseases  Procedures:  As delineated as above  Antimicrobials:  Anti-infectives (From admission, onward)    Start     Dose/Rate Route Frequency Ordered Stop   08/10/22 2245  cefTRIAXone (ROCEPHIN) 2 g in sodium chloride 0.9 % 100 mL IVPB        2 g 200 mL/hr over 30 Minutes Intravenous Every 24 hours 08/09/22 2235     08/10/22 1200  vancomycin (VANCOREADY) IVPB 2000 mg/400 mL        2,000 mg 200 mL/hr over 120 Minutes Intravenous Every 12 hours 08/09/22 2318     08/10/22 0015  vancomycin (VANCOCIN) 2,500 mg in sodium chloride 0.9 % 500 mL IVPB        2,500 mg 262.5 mL/hr over 120 Minutes Intravenous  Once 08/09/22 2318 08/10/22 0414   08/09/22 2345  vancomycin (VANCOCIN) IVPB 1000 mg/200 mL premix  Status:  Discontinued        1,000 mg 200 mL/hr over 60 Minutes Intravenous  Once 08/09/22 2249 08/09/22 2318   08/09/22 2230  vancomycin (VANCOCIN) IVPB 1000 mg/200 mL premix  Status:  Discontinued        1,000 mg 200 mL/hr over 60 Minutes  Intravenous  Once 08/09/22 2217 08/10/22 0333   08/09/22 2200  cefTRIAXone (ROCEPHIN) 2 g in sodium chloride 0.9 % 100 mL IVPB        2 g 200 mL/hr over 30 Minutes Intravenous  Once 08/09/22 2153 08/10/22 0039        Subjective: Patient was seen and examined at bedside he is complaining of some pain in his leg.  Notes more maggots coming out.  Denies any chest pain or shortness of breath.  Feels okay.  No other concerns or complaints at this time.  Objective: Vitals:   08/09/22 2308 08/10/22 0226 08/10/22 0614 08/10/22 0747  BP:  (!) 171/88 (!) 145/90 (!) 145/76  Pulse:  73 79 81  Resp:  20 20 19   Temp:  98.1 F (36.7 C) 98.1 F (36.7 C) 98.2 F (36.8 C)  TempSrc:  Oral  Oral Oral  SpO2:  98% 95% 96%  Weight: (!) 241.3 kg     Height:        Intake/Output Summary (Last 24 hours) at 08/10/2022 1404 Last data filed at 08/10/2022 0500 Gross per 24 hour  Intake 360 ml  Output 600 ml  Net -240 ml   Filed Weights   08/09/22 2308  Weight: (!) 241.3 kg   Examination: Physical Exam:  Constitutional: WN/WD super morbidly obese African-American male Respiratory: Diminished to auscultation bilaterally, no wheezing, rales, rhonchi or crackles. Normal respiratory effort and patient is not tachypenic. No accessory muscle use.  Unlabored breathing Cardiovascular: RRR, no murmurs / rubs / gallops. S1 and S2 auscultated. No extremity edema.  Abdomen: Soft, non-tender, distended secondary to body habitus. Bowel sounds positive.  GU: Deferred. Musculoskeletal: No clubbing / cyanosis of digits/nails.  Has bilateral lower extremity edema Skin: Has a skin wound on the posterior right leg with maggots Neurologic: CN 2-12 grossly intact with no focal deficits. Romberg sign and cerebellar reflexes not assessed.  Psychiatric: Normal judgment and insight. Alert and oriented x 3. Normal mood and appropriate affect.   Data Reviewed: I have personally reviewed following labs and imaging  studies  CBC: Recent Labs  Lab 08/09/22 2106 08/10/22 0107  WBC 8.6 7.6  NEUTROABS 4.8  --   HGB 12.3* 11.6*  HCT 39.9 37.4*  MCV 80.1 80.8  PLT 266 226   Basic Metabolic Panel: Recent Labs  Lab 08/09/22 2106 08/10/22 0107  NA 139 138  K 2.6* 2.5*  CL 98 97*  CO2 33* 32  GLUCOSE 118* 99  BUN 16 16  CREATININE 1.51* 1.36*  CALCIUM 8.6* 8.3*  MG  --  2.1  PHOS  --  3.8   GFR: Estimated Creatinine Clearance (by C-G formula based on SCr of 1.36 mg/dL (H)) Male: 161.0 mL/min (A) Male: 156.4 mL/min (A) Liver Function Tests: No results for input(s): "AST", "ALT", "ALKPHOS", "BILITOT", "PROT", "ALBUMIN" in the last 168 hours. No results for input(s): "LIPASE", "AMYLASE" in the last 168 hours. No results for input(s): "AMMONIA" in the last 168 hours. Coagulation Profile: No results for input(s): "INR", "PROTIME" in the last 168 hours. Cardiac Enzymes: No results for input(s): "CKTOTAL", "CKMB", "CKMBINDEX", "TROPONINI" in the last 168 hours. BNP (last 3 results) No results for input(s): "PROBNP" in the last 8760 hours. HbA1C: No results for input(s): "HGBA1C" in the last 72 hours. CBG: No results for input(s): "GLUCAP" in the last 168 hours. Lipid Profile: No results for input(s): "CHOL", "HDL", "LDLCALC", "TRIG", "CHOLHDL", "LDLDIRECT" in the last 72 hours. Thyroid Function Tests: No results for input(s): "TSH", "T4TOTAL", "FREET4", "T3FREE", "THYROIDAB" in the last 72 hours. Anemia Panel: No results for input(s): "VITAMINB12", "FOLATE", "FERRITIN", "TIBC", "IRON", "RETICCTPCT" in the last 72 hours. Sepsis Labs: Recent Labs  Lab 08/09/22 2106  LATICACIDVEN 1.9    Recent Results (from the past 240 hour(s))  Culture, blood (routine x 2)     Status: None (Preliminary result)   Collection Time: 08/09/22  8:49 PM   Specimen: BLOOD  Result Value Ref Range Status   Specimen Description BLOOD LEFT ANTECUBITAL  Final   Special Requests   Final    BOTTLES DRAWN  AEROBIC AND ANAEROBIC Blood Culture adequate volume   Culture   Final    NO GROWTH < 12 HOURS Performed at [facility], [address], [place], [place] [address]    Report Status PENDING  Incomplete  Culture, blood (routine  x 2)     Status: None (Preliminary result)   Collection Time: 08/09/22  8:54 PM   Specimen: BLOOD  Result Value Ref Range Status   Specimen Description BLOOD RIGHT ANTECUBITAL  Final   Special Requests   Final    BOTTLES DRAWN AEROBIC AND ANAEROBIC Blood Culture adequate volume   Culture   Final    NO GROWTH < 12 HOURS Performed at [facility], [address] [place], [place] [address]    Report Status PENDING  Incomplete  MRSA Next Gen by PCR, Nasal     Status: None   Collection Time: 08/10/22 12:21 AM   Specimen: Nasal Mucosa; Nasal Swab  Result Value Ref Range Status   MRSA by PCR Next Gen NOT DETECTED NOT DETECTED Final    Comment: (NOTE) The GeneXpert MRSA Assay (FDA approved for NASAL specimens only), is one component of a comprehensive MRSA colonization surveillance program. It is not intended to diagnose MRSA infection nor to guide or monitor treatment for MRSA infections. Test performance is not FDA approved in patients less than 13 years old. Performed at [facility], [address] [place], [place] [address]     Radiology Studies: DG Tibia/Fibula Right  Result Date: 08/09/2022 CLINICAL DATA:  Wound infection EXAM: RIGHT TIBIA AND FIBULA - 2 VIEW COMPARISON:  09/16/2021 FINDINGS: No fracture or malalignment. Diffuse soft tissue edema. No soft tissue emphysema. No periostitis or osseous destructive change. IMPRESSION: Diffuse soft tissue edema. No acute osseous abnormality. Electronically Signed   By: Jasmine Pang M.D.   On: 08/09/2022 21:42    Scheduled Meds:  amLODipine  10 mg Oral Daily   enoxaparin (LOVENOX) injection  40 mg Subcutaneous QHS   Continuous Infusions:  cefTRIAXone (ROCEPHIN)  IV     lactated ringers  1,000 mL with potassium chloride 40 mEq infusion 30 mL/hr at 08/10/22 0208   vancomycin 2,000 mg (08/10/22 1303)    LOS: 1 day   Marguerita Merles, DO [facility] Available via Epic secure chat 7am-7pm After these hours, please refer to coverage provider listed on [email or web address] 08/10/2022, 2:04 PM

## 2022-08-11 DIAGNOSIS — E876 Hypokalemia: Secondary | ICD-10-CM

## 2022-08-11 LAB — CBC WITH DIFFERENTIAL/PLATELET
Abs Immature Granulocytes: 0.01 10*3/uL (ref 0.00–0.07)
Basophils Absolute: 0 10*3/uL (ref 0.0–0.1)
Basophils Relative: 1 %
Eosinophils Absolute: 0.4 10*3/uL (ref 0.0–0.5)
Eosinophils Relative: 6 %
HCT: 37.7 % — ABNORMAL LOW (ref 39.0–52.0)
Hemoglobin: 11.6 g/dL — ABNORMAL LOW (ref 13.0–17.0)
Immature Granulocytes: 0 %
Lymphocytes Relative: 38 %
Lymphs Abs: 2.2 10*3/uL (ref 0.7–4.0)
MCH: 25.1 pg — ABNORMAL LOW (ref 26.0–34.0)
MCHC: 30.8 g/dL (ref 30.0–36.0)
MCV: 81.6 fL (ref 80.0–100.0)
Monocytes Absolute: 0.6 10*3/uL (ref 0.1–1.0)
Monocytes Relative: 11 %
Neutro Abs: 2.7 10*3/uL (ref 1.7–7.7)
Neutrophils Relative %: 44 %
Platelets: 239 10*3/uL (ref 150–400)
RBC: 4.62 MIL/uL (ref 4.22–5.81)
RDW: 16.7 % — ABNORMAL HIGH (ref 11.5–15.5)
WBC: 6 10*3/uL (ref 4.0–10.5)
nRBC: 0 % (ref 0.0–0.2)

## 2022-08-11 LAB — COMPREHENSIVE METABOLIC PANEL
ALT: 17 U/L (ref 0–44)
AST: 19 U/L (ref 15–41)
Albumin: 2.9 g/dL — ABNORMAL LOW (ref 3.5–5.0)
Alkaline Phosphatase: 69 U/L (ref 38–126)
Anion gap: 8 (ref 5–15)
BUN: 16 mg/dL (ref 6–20)
CO2: 29 mmol/L (ref 22–32)
Calcium: 8.2 mg/dL — ABNORMAL LOW (ref 8.9–10.3)
Chloride: 99 mmol/L (ref 98–111)
Creatinine, Ser: 1.3 mg/dL — ABNORMAL HIGH (ref 0.61–1.24)
GFR, Estimated: >60 mL/min (ref >60)
Glucose, Bld: 95 mg/dL (ref 70–99)
Potassium: 2.8 mmol/L — ABNORMAL LOW (ref 3.5–5.1)
Sodium: 136 mmol/L (ref 135–145)
Total Bilirubin: 0.5 mg/dL (ref 0.3–1.2)
Total Protein: 6.6 g/dL (ref 6.5–8.1)

## 2022-08-11 LAB — PHOSPHORUS: Phosphorus: 3.7 mg/dL (ref 2.5–4.6)

## 2022-08-11 LAB — MAGNESIUM: Magnesium: 2 mg/dL (ref 1.7–2.4)

## 2022-08-11 MED ORDER — POTASSIUM CHLORIDE 10 MEQ/100ML IV SOLN
10.0000 meq | INTRAVENOUS | Status: AC
  Administered 2022-08-11 (×3): 10 meq via INTRAVENOUS
  Filled 2022-08-11 (×3): qty 100

## 2022-08-11 MED ORDER — POTASSIUM CHLORIDE CRYS ER 20 MEQ PO TBCR
40.0000 meq | EXTENDED_RELEASE_TABLET | Freq: Two times a day (BID) | ORAL | Status: AC
  Administered 2022-08-11 (×2): 40 meq via ORAL
  Filled 2022-08-11 (×2): qty 2

## 2022-08-11 MED ORDER — POTASSIUM CHLORIDE 10 MEQ/100ML IV SOLN
10.0000 meq | Freq: Once | INTRAVENOUS | Status: AC
  Administered 2022-08-11: 10 meq via INTRAVENOUS
  Filled 2022-08-11: qty 100

## 2022-08-11 NOTE — Progress Notes (Signed)
PROGRESS NOTE    Jared Mccoy  ZOX:[id number] DOB: 1990-07-07 DOA: 08/09/2022 PCP: Pete Pelt, PA-C   Brief Narrative:  The patient is a 32 year old super morbidly obese African-American male with a past medical history significant for II hypertension, chronic edema of the bilateral extremities, chronic nonhealing wounds bilaterally with acute worsening of the right wound with a chronic leg wound for the last 4 to 5 years.  Patient initially presented to the urgent care after seeing maggots, resumed back of his right leg.  He is going to take a shower and washed off the wound and continues to maggots coming out of it and he continues to have some associated pain.  He denies any subjective chills or fevers.  Because of this he decided to come to urgent care and does not follow with the wound care specialist or clinic.  The patient states that he has been going to urgent care to the emergency department for his wound.  He presented to the [facility] ED at the recommendation of urgent care provider.  In the ED he was alert and in no acute distress and afebrile with no leukocytosis and the posterior extremity wound was noted to have purulent drainage.  Basic blood work was done and showed that he had a hypokalemia.  Case has been discussed with orthopedic surgery who will evaluate Monday if he is still here and case has also been discussed with ID who will evaluate the chart and make further recommendations and they are recommending to change to po Augmentin and Doxycycline at D/C pending Ortho Evaluation and WOC nurse evaluation also pending.   Assessment and Plan:  Right lower extremity wound infection with maggots present, POA -Wound care specialist consulted and pending evaluation -Continue IV antibiotics initiated in the ED IV vancomycin and Rocephin for now -Follow peripheral blood cultures and MRSA screening test -Currently afebrile with no leukocytosis -Gentle IV fluid hydration LR  KCl 40 mill equivalent at 30 cc/h x 2 days while on IV vancomycin -Case was discussed with orthopedic surgery and they will evaluate Monday and Dr. Lajoyce Corners see if he is still here  -WBC and Lactic Acid Level Trend: Recent Labs  Lab 08/09/22 2106 08/10/22 0107 08/11/22 0050  WBC 8.6 7.6 6.0  LATICACIDVEN 1.9  --   --   -Also discussed the case with ID I will review the chart and make further recommendations and they are recommending po Augmentin and Doxy at D/C pending Ortho evaluation -C/w Pain control with p.o. oxycodone 5 mg every 6 hours as needed for severe pain and bowel regimen with MiraLAX 17 g p.o. daily as needed   Essential Hypertension, BP is not at goal, elevated -The patient reports he did not take his blood pressure medications this morning -Resume home Norvasc -Continue to Hold home diuretics, chlorthalidone, Lasix given AKI and hypokalemia -Continue to hold home losartan due to elevated creatinine -Continue to monitor vital signs and BP per Protocol -Last BP reading was 166/103    QTc prolongation in the setting of Electrolyte Abnormalities  -Presented with QTc of 530 on twelve-lead EKG -Avoid QTc prolonging agents -Optimize magnesium and potassium levels. -Continue to Monitor on remote telemetry in MedSurg unit.   Hyperglycemia -Mild. Glucose Level Trend: Recent Labs  Lab 08/09/22 2106 08/10/22 0107 08/10/22 1543 08/11/22 0050  GLUCOSE 118* 99 85 95  -Check HbA1c in the AM; Last HbA1c was 5.7 in our system -Conitor for now with repeat CMP in the AM  Hypokalemia -In the setting of Diuretic Use;  Patient's K+ Level Trend: Recent Labs  Lab 08/09/22 2106 08/10/22 0107 08/10/22 1543 08/11/22 0050  K 2.6* 2.5* 2.9* 2.8*  -Replete with po KCL 40 mEQ BID x2, IV Kcl 40 mEQ 40 mEQ x1, and IV with LR + 40 mEQ Kcl at 30 mL/hr -Continue to Monitor and Replete as Necessary -Repeat CMP in the AM   AKI -Baseline Cr appears to be around 0.9-1.2 BUN/Cr  Trend: Recent Labs  Lab 08/09/22 2106 08/10/22 0107 08/10/22 1543 08/11/22 0050  BUN 16 16 15 16   CREATININE 1.51* 1.36* 1.31* 1.30*  -On IVF with LR at 30 mL/hr x2 days per admitting but may need to increase rate  -Avoid Nephrotoxic Medications, Contrast Dyes, Hypotension and Dehydration to Ensure Adequate Renal Perfusion and will need to Renally Adjust Meds -Continue to Monitor and Trend Renal Function carefully and repeat CMP in the AM   Normocytic Anemia -Hgb/Hct Trend: Recent Labs  Lab 08/09/22 2106 08/10/22 0107 08/11/22 0050  HGB 12.3* 11.6* 11.6*  HCT 39.9 37.4* 37.7*  MCV 80.1 80.8 81.6  -Check Anemia Panel in the AM -Continue to Monitor for S/Sx of Bleeding; No overt bleeding noted -Repeat CBC in the AM  Hypoalbuminemia -Patient's Albumin Trend: Recent Labs  Lab 08/11/22 0050  ALBUMIN 2.9*  -Continue to Monitor and Trend and repeat CMP in the AM  Super Morbid Obesity -Complicates overall prognosis and care -Estimated body mass index is 72.15 kg/m as calculated from the following:   Height as of this encounter: 6' (1.829 m).   Weight as of this encounter: 241.3 kg.  -Weight Loss and Dietary Counseling given   DVT prophylaxis: Enoxaparin 120 mg sq qHS    Code Status: Full Code Family Communication: No family present at bedside  Disposition Plan:  Level of care: Med-Surg Status is: Inpatient Remains inpatient appropriate because: Electrolytes remain low and pending Ortho and WOC Nurse evaluation   Consultants:  Orthopedic Surgery Case was discussed with ID over the phone  Procedures:  None  Antimicrobials:  Anti-infectives (From admission, onward)    Start     Dose/Rate Route Frequency Ordered Stop   08/10/22 2245  cefTRIAXone (ROCEPHIN) 2 g in sodium chloride 0.9 % 100 mL IVPB        2 g 200 mL/hr over 30 Minutes Intravenous Every 24 hours 08/09/22 2235     08/10/22 1200  vancomycin (VANCOREADY) IVPB 2000 mg/400 mL        2,000 mg 200 mL/hr  over 120 Minutes Intravenous Every 12 hours 08/09/22 2318     08/10/22 0015  vancomycin (VANCOCIN) 2,500 mg in sodium chloride 0.9 % 500 mL IVPB        2,500 mg 262.5 mL/hr over 120 Minutes Intravenous  Once 08/09/22 2318 08/10/22 0414   08/09/22 2345  vancomycin (VANCOCIN) IVPB 1000 mg/200 mL premix  Status:  Discontinued        1,000 mg 200 mL/hr over 60 Minutes Intravenous  Once 08/09/22 2249 08/09/22 2318   08/09/22 2230  vancomycin (VANCOCIN) IVPB 1000 mg/200 mL premix  Status:  Discontinued        1,000 mg 200 mL/hr over 60 Minutes Intravenous  Once 08/09/22 2217 08/10/22 0333   08/09/22 2200  cefTRIAXone (ROCEPHIN) 2 g in sodium chloride 0.9 % 100 mL IVPB        2 g 200 mL/hr over 30 Minutes Intravenous  Once 08/09/22 2153 08/10/22 0039  Subjective: Seen and examined at bedside his legs still hurting.  He states that he has not noticed any more maggots now.  No chest pain or shortness breath.  Electrolytes are still very low.  Denied lightheadedness or dizziness.  No other concerns or close this time.  Objective: Vitals:   08/10/22 1636 08/10/22 2004 08/11/22 0549 08/11/22 0807  BP: (!) 157/83 (!) 158/93 (!) 169/97 (!) 166/103  Pulse: 72 84 79 71  Resp: 19 18 20 18   Temp: 97.8 F (36.6 C) 98.2 F (36.8 C) 98.6 F (37 C) 98 F (36.7 C)  TempSrc: Oral Oral Oral   SpO2: 92% 99% 96% 96%  Weight:      Height:        Intake/Output Summary (Last 24 hours) at 08/11/2022 1421 Last data filed at 08/11/2022 1610 Gross per 24 hour  Intake 1461.57 ml  Output 500 ml  Net 961.57 ml   Filed Weights   08/09/22 2308  Weight: (!) 241.3 kg   Examination: Physical Exam:  Constitutional: WN/WD super morbidly obese African-American male in no acute distress appears a little uncomfortable though Respiratory: Diminished to auscultation bilaterally, no wheezing, rales, rhonchi or crackles. Normal respiratory effort and patient is not tachypenic. No accessory muscle use.  Unlabored  breathing Cardiovascular: RRR, no murmurs / rubs / gallops. S1 and S2 auscultated.  Has bilateral lower extremity edema Abdomen: Soft, non-tender, distended secondary to body habitus. Bowel sounds positive.  GU: Deferred. Musculoskeletal: No clubbing / cyanosis of digits/nails. No joint deformity upper and lower extremities.  Skin: Has a skin wound on the posterior right leg with slight erythema and no more maggots today.  Leg is swollen Neurologic: CN 2-12 grossly intact with no focal deficits.  Romberg sign and cerebellar reflexes not assessed.  Psychiatric: Normal judgment and insight. Alert and oriented x 3. Normal mood and appropriate affect.   Data Reviewed: I have personally reviewed following labs and imaging studies  CBC: Recent Labs  Lab 08/09/22 2106 08/10/22 0107 08/11/22 0050  WBC 8.6 7.6 6.0  NEUTROABS 4.8  --  2.7  HGB 12.3* 11.6* 11.6*  HCT 39.9 37.4* 37.7*  MCV 80.1 80.8 81.6  PLT 266 226 239   Basic Metabolic Panel: Recent Labs  Lab 08/09/22 2106 08/10/22 0107 08/10/22 1543 08/11/22 0050  NA 139 138 138 136  K 2.6* 2.5* 2.9* 2.8*  CL 98 97* 99 99  CO2 33* 32 30 29  GLUCOSE 118* 99 85 95  BUN 16 16 15 16   CREATININE 1.51* 1.36* 1.31* 1.30*  CALCIUM 8.6* 8.3* 8.5* 8.2*  MG  --  2.1  --  2.0  PHOS  --  3.8  --  3.7   GFR: Estimated Creatinine Clearance (by C-G formula based on SCr of 1.3 mg/dL (H)) Male: 960.4 mL/min (A) Male: 163.6 mL/min (A) Liver Function Tests: Recent Labs  Lab 08/11/22 0050  AST 19  ALT 17  ALKPHOS 69  BILITOT 0.5  PROT 6.6  ALBUMIN 2.9*   No results for input(s): "LIPASE", "AMYLASE" in the last 168 hours. No results for input(s): "AMMONIA" in the last 168 hours. Coagulation Profile: No results for input(s): "INR", "PROTIME" in the last 168 hours. Cardiac Enzymes: No results for input(s): "CKTOTAL", "CKMB", "CKMBINDEX", "TROPONINI" in the last 168 hours. BNP (last 3 results) No results for input(s): "PROBNP" in the  last 8760 hours. HbA1C: No results for input(s): "HGBA1C" in the last 72 hours. CBG: No results for input(s): "GLUCAP" in the  last 168 hours. Lipid Profile: No results for input(s): "CHOL", "HDL", "LDLCALC", "TRIG", "CHOLHDL", "LDLDIRECT" in the last 72 hours. Thyroid Function Tests: No results for input(s): "TSH", "T4TOTAL", "FREET4", "T3FREE", "THYROIDAB" in the last 72 hours. Anemia Panel: No results for input(s): "VITAMINB12", "FOLATE", "FERRITIN", "TIBC", "IRON", "RETICCTPCT" in the last 72 hours. Sepsis Labs: Recent Labs  Lab 08/09/22 2106  LATICACIDVEN 1.9    Recent Results (from the past 240 hour(s))  Culture, blood (routine x 2)     Status: None (Preliminary result)   Collection Time: 08/09/22  8:49 PM   Specimen: BLOOD  Result Value Ref Range Status   Specimen Description BLOOD LEFT ANTECUBITAL  Final   Special Requests   Final    BOTTLES DRAWN AEROBIC AND ANAEROBIC Blood Culture adequate volume   Culture   Final    NO GROWTH 2 DAYS Performed at [facility], [address] [place], [place] [address]    Report Status PENDING  Incomplete  Culture, blood (routine x 2)     Status: None (Preliminary result)   Collection Time: 08/09/22  8:54 PM   Specimen: BLOOD  Result Value Ref Range Status   Specimen Description BLOOD RIGHT ANTECUBITAL  Final   Special Requests   Final    BOTTLES DRAWN AEROBIC AND ANAEROBIC Blood Culture adequate volume   Culture   Final    NO GROWTH 2 DAYS Performed at [facility], [address], [place], [place] [address]    Report Status PENDING  Incomplete  MRSA Next Gen by PCR, Nasal     Status: None   Collection Time: 08/10/22 12:21 AM   Specimen: Nasal Mucosa; Nasal Swab  Result Value Ref Range Status   MRSA by PCR Next Gen NOT DETECTED NOT DETECTED Final    Comment: (NOTE) The GeneXpert MRSA Assay (FDA approved for NASAL specimens only), is one component of a comprehensive MRSA colonization surveillance program. It is  not intended to diagnose MRSA infection nor to guide or monitor treatment for MRSA infections. Test performance is not FDA approved in patients less than 4 years old. Performed at [facility], [address], [place], [place] [address]     Radiology Studies: DG Tibia/Fibula Right  Result Date: 08/09/2022 CLINICAL DATA:  Wound infection EXAM: RIGHT TIBIA AND FIBULA - 2 VIEW COMPARISON:  09/16/2021 FINDINGS: No fracture or malalignment. Diffuse soft tissue edema. No soft tissue emphysema. No periostitis or osseous destructive change. IMPRESSION: Diffuse soft tissue edema. No acute osseous abnormality. Electronically Signed   By: Jasmine Pang M.D.   On: 08/09/2022 21:42    Scheduled Meds:  amLODipine  10 mg Oral Daily   enoxaparin (LOVENOX) injection  120 mg Subcutaneous QHS   potassium chloride  40 mEq Oral BID   Continuous Infusions:  cefTRIAXone (ROCEPHIN)  IV 2 g (08/10/22 2250)   lactated ringers 1,000 mL with potassium chloride 40 mEq infusion 30 mL/hr at 08/10/22 0208   vancomycin 2,000 mg (08/11/22 1157)    LOS: 2 days   Marguerita Merles, DO [facility] Available via Epic secure chat 7am-7pm After these hours, please refer to coverage provider listed on [email or web address] 08/11/2022, 2:21 PM

## 2022-08-12 ENCOUNTER — Other Ambulatory Visit: Payer: Self-pay

## 2022-08-12 DIAGNOSIS — E876 Hypokalemia: Secondary | ICD-10-CM

## 2022-08-12 DIAGNOSIS — T148XXA Other injury of unspecified body region, initial encounter: Secondary | ICD-10-CM

## 2022-08-12 DIAGNOSIS — L089 Local infection of the skin and subcutaneous tissue, unspecified: Secondary | ICD-10-CM

## 2022-08-12 LAB — CBC WITH DIFFERENTIAL/PLATELET
Abs Immature Granulocytes: 0.02 10*3/uL (ref 0.00–0.07)
Basophils Absolute: 0 10*3/uL (ref 0.0–0.1)
Basophils Relative: 0 %
Eosinophils Absolute: 0.3 10*3/uL (ref 0.0–0.5)
Eosinophils Relative: 5 %
HCT: 38.2 % — ABNORMAL LOW (ref 39.0–52.0)
Hemoglobin: 11.6 g/dL — ABNORMAL LOW (ref 13.0–17.0)
Immature Granulocytes: 0 %
Lymphocytes Relative: 47 %
Lymphs Abs: 2.7 10*3/uL (ref 0.7–4.0)
MCH: 24.5 pg — ABNORMAL LOW (ref 26.0–34.0)
MCHC: 30.4 g/dL (ref 30.0–36.0)
MCV: 80.6 fL (ref 80.0–100.0)
Monocytes Absolute: 0.5 10*3/uL (ref 0.1–1.0)
Monocytes Relative: 8 %
Neutro Abs: 2.3 10*3/uL (ref 1.7–7.7)
Neutrophils Relative %: 40 %
Platelets: 252 10*3/uL (ref 150–400)
RBC: 4.74 MIL/uL (ref 4.22–5.81)
RDW: 16.4 % — ABNORMAL HIGH (ref 11.5–15.5)
WBC: 5.8 10*3/uL (ref 4.0–10.5)
nRBC: 0 % (ref 0.0–0.2)

## 2022-08-12 LAB — RETICULOCYTES
Immature Retic Fract: 15.8 % (ref 2.3–15.9)
RBC.: 4.68 MIL/uL (ref 4.22–5.81)
Retic Count, Absolute: 76.8 10*3/uL (ref 19.0–186.0)
Retic Ct Pct: 1.6 % (ref 0.4–3.1)

## 2022-08-12 LAB — VITAMIN B12: Vitamin B-12: 326 pg/mL (ref 180–914)

## 2022-08-12 LAB — COMPREHENSIVE METABOLIC PANEL
ALT: 17 U/L (ref 0–44)
AST: 20 U/L (ref 15–41)
Albumin: 3 g/dL — ABNORMAL LOW (ref 3.5–5.0)
Alkaline Phosphatase: 65 U/L (ref 38–126)
Anion gap: 7 (ref 5–15)
BUN: 13 mg/dL (ref 6–20)
CO2: 28 mmol/L (ref 22–32)
Calcium: 8.2 mg/dL — ABNORMAL LOW (ref 8.9–10.3)
Chloride: 101 mmol/L (ref 98–111)
Creatinine, Ser: 1.16 mg/dL (ref 0.61–1.24)
GFR, Estimated: >60 mL/min (ref >60)
Glucose, Bld: 88 mg/dL (ref 70–99)
Potassium: 3 mmol/L — ABNORMAL LOW (ref 3.5–5.1)
Sodium: 136 mmol/L (ref 135–145)
Total Bilirubin: 0.4 mg/dL (ref 0.3–1.2)
Total Protein: 6.7 g/dL (ref 6.5–8.1)

## 2022-08-12 LAB — BASIC METABOLIC PANEL
Anion gap: 7 (ref 5–15)
BUN: 13 mg/dL (ref 6–20)
CO2: 28 mmol/L (ref 22–32)
Calcium: 8.4 mg/dL — ABNORMAL LOW (ref 8.9–10.3)
Chloride: 99 mmol/L (ref 98–111)
Creatinine, Ser: 1.22 mg/dL (ref 0.61–1.24)
GFR, Estimated: >60 mL/min (ref >60)
Glucose, Bld: 96 mg/dL (ref 70–99)
Potassium: 3.3 mmol/L — ABNORMAL LOW (ref 3.5–5.1)
Sodium: 134 mmol/L — ABNORMAL LOW (ref 135–145)

## 2022-08-12 LAB — FERRITIN: Ferritin: 23 ng/mL — ABNORMAL LOW (ref 24–336)

## 2022-08-12 LAB — PHOSPHORUS: Phosphorus: 3.3 mg/dL (ref 2.5–4.6)

## 2022-08-12 LAB — MAGNESIUM: Magnesium: 1.8 mg/dL (ref 1.7–2.4)

## 2022-08-12 LAB — FOLATE: Folate: 6.7 ng/mL (ref >5.9)

## 2022-08-12 LAB — IRON AND TIBC
Iron: 43 ug/dL — ABNORMAL LOW (ref 45–182)
Saturation Ratios: 11 % — ABNORMAL LOW (ref 17.9–39.5)
TIBC: 395 ug/dL (ref 250–450)
UIBC: 352 ug/dL

## 2022-08-12 MED ORDER — POLYETHYLENE GLYCOL 3350 17 G PO PACK
17.0000 g | PACK | Freq: Every day | ORAL | 0 refills | Status: DC | PRN
  Filled 2022-08-12 – 2023-06-02 (×2): qty 14, 14d supply, fill #0

## 2022-08-12 MED ORDER — DOXYCYCLINE HYCLATE 100 MG PO TABS: 100.0000 mg | ORAL_TABLET | Freq: Two times a day (BID) | ORAL | Status: DC

## 2022-08-12 MED ORDER — LOSARTAN POTASSIUM 50 MG PO TABS
50.0000 mg | ORAL_TABLET | Freq: Every day | ORAL | Status: DC
  Administered 2022-08-12: 50 mg via ORAL
  Filled 2022-08-12: qty 1

## 2022-08-12 MED ORDER — DOXYCYCLINE HYCLATE 100 MG PO TABS
100.0000 mg | ORAL_TABLET | Freq: Two times a day (BID) | ORAL | 0 refills | Status: AC
  Filled 2022-08-12: qty 8, 4d supply, fill #0

## 2022-08-12 MED ORDER — MAGNESIUM SULFATE 2 GM/50ML IV SOLN
2.0000 g | Freq: Once | INTRAVENOUS | Status: AC
  Administered 2022-08-12: 2 g via INTRAVENOUS
  Filled 2022-08-12: qty 50

## 2022-08-12 MED ORDER — POTASSIUM CHLORIDE CRYS ER 20 MEQ PO TBCR: 40.0000 meq | EXTENDED_RELEASE_TABLET | Freq: Two times a day (BID) | ORAL | Status: DC

## 2022-08-12 MED ORDER — AMOXICILLIN-POT CLAVULANATE 875-125 MG PO TABS
1.0000 | ORAL_TABLET | Freq: Two times a day (BID) | ORAL | 0 refills | Status: AC
  Filled 2022-08-12: qty 8, 4d supply, fill #0

## 2022-08-12 MED ORDER — POTASSIUM CHLORIDE CRYS ER 20 MEQ PO TBCR
40.0000 meq | EXTENDED_RELEASE_TABLET | Freq: Once | ORAL | Status: AC
  Administered 2022-08-12: 40 meq via ORAL
  Filled 2022-08-12: qty 2

## 2022-08-12 MED ORDER — POTASSIUM CHLORIDE CRYS ER 20 MEQ PO TBCR
40.0000 meq | EXTENDED_RELEASE_TABLET | Freq: Two times a day (BID) | ORAL | Status: DC
  Administered 2022-08-12: 40 meq via ORAL
  Filled 2022-08-12: qty 2

## 2022-08-12 MED ORDER — POTASSIUM CHLORIDE 10 MEQ/100ML IV SOLN
10.0000 meq | INTRAVENOUS | Status: AC
  Administered 2022-08-12 (×4): 10 meq via INTRAVENOUS
  Filled 2022-08-12 (×4): qty 100

## 2022-08-12 MED ORDER — OXYCODONE HCL 5 MG PO TABS
5.0000 mg | ORAL_TABLET | Freq: Four times a day (QID) | ORAL | 0 refills | Status: AC | PRN
  Filled 2022-08-12: qty 12, 3d supply, fill #0

## 2022-08-12 MED ORDER — AMOXICILLIN-POT CLAVULANATE 875-125 MG PO TABS: 1.0000 | ORAL_TABLET | Freq: Two times a day (BID) | ORAL | Status: DC

## 2022-08-12 NOTE — Consult Note (Addendum)
WOC Nurse Consult Note: Reason for Consult: Consult requested for right leg wound. It was noted to have maggots on admission, but currently none are visible.  Wound type:Right posterior calf with full thickness wound in patchy areas, separated by narrow bridges of intact skin; total area is 4X8X.2cm, red and moist, small amt yellow drainage.   Dressing procedure/placement/frequency: Topical treatment orders provided for bedside nurses to perform to provide antimicrobial benefits and absorb drainage and eliminate any further maggots if they eventually hatch as follows: Cleanse right leg wound Q day with peroxide, then cover with Aquacel [place] [phone]) and cover with foam dressing. Change foam dressing Q 3 days or PRN soiling. Please re-consult if further assistance is needed.  Thank-you,  Cammie Mcgee MSN, RN, CWOCN, [place], CNS [phone]

## 2022-08-12 NOTE — TOC Transition Note (Signed)
Transition of Care [facility]) - CM/SW Discharge Note   Patient Details  Name: TAIVEN FAUPEL MRN: [id number] Date of Birth: Oct 29, 1990  [facility] of [facility]) CM/SW Contact:  Ronny Bacon, RN Phone Number: 08/12/2022, 3:57 PM   Clinical Narrative:  Patient being discharged home today. Patient reports lives at home with his mom and kids. Patient confirms that his mom can assist with his wound care needs. Phone call attempt made to schedule appointment with [facility], voicemail left. Facility information placed on AVS for patient with instructions to call for follow up. Attempts made to arrange [facility] RN for wound care, [facility] declined due to insurance will not cover with their service. [facility] declined due to no openings available for [facility] RN services. Patient also encouraged to follow up with PCP to assist with wound care resources.    Final next level of care: Home/Self Care Barriers to Discharge: No Barriers Identified   Patient Goals and CMS Choice      Discharge Placement                         Discharge Plan and Services Additional resources added to the After Visit Summary for                                       Social Determinants of Health (SDOH) Interventions SDOH Screenings   Food Insecurity: Unknown (08/10/2022)  Depression (PHQ2-9): Low Risk  (10/03/2021)  Tobacco Use: High Risk (08/09/2022)     Readmission Risk Interventions     No data to display

## 2022-08-12 NOTE — Discharge Summary (Signed)
Physician Discharge Summary   Patient: Jared Mccoy MRN: [id number] DOB: 1990-03-02  Admit date:     08/09/2022  Discharge date: 08/12/2022  Discharge Physician: Marguerita Merles, DO   PCP: Pete Pelt, PA-C   Recommendations at discharge:   Follow-up with PCP within 1 to 2 weeks and repeat CBC, CMP, mag, Phos within 1 week With orthopedic surgery in outpatient setting for wound evaluation Follow-up with the wound care center  Discharge Diagnoses: Principal Problem:   Wound infection Active Problems:   Hypokalemia  Resolved Problems:   * No resolved hospital problems. [facility] Course: The patient is a 32 year old super morbidly obese African-American male with a past medical history significant for II hypertension, chronic edema of the bilateral extremities, chronic nonhealing wounds bilaterally with acute worsening of the right wound with a chronic leg wound for the last 4 to 5 years.  Patient initially presented to the urgent care after seeing maggots, resumed back of his right leg.  He is going to take a shower and washed off the wound and continues to maggots coming out of it and he continues to have some associated pain.  He denies any subjective chills or fevers.  Because of this he decided to come to urgent care and does not follow with the wound care specialist or clinic.  The patient states that he has been going to urgent care to the emergency department for his wound.  He presented to the [facility] ED at the recommendation of urgent care provider.  In the ED he was alert and in no acute distress and afebrile with no leukocytosis and the posterior extremity wound was noted to have purulent drainage.  Basic blood work was done and showed that he had a hypokalemia.    Case has been discussed with orthopedic surgery who will evaluate Monday if he is still here and case has also been discussed with ID who will evaluate the chart and make further recommendations and they are  recommending to change to po Augmentin and Doxycycline at D/C pending Ortho Evaluation and WOC nurse evaluation also pending.  Unfortunately the orthopedic surgeon [place] and a referral has been made to his clinic.  Wound nurse evaluated and provided recommendations.  Patient is improved and wound had no more maggots and he will be discharged and follow-up with PCP, orthopedic surgery wound care clinic outpatient setting.  Assessment and Plan:  Right lower extremity wound infection with maggots present, POA -Wound care specialist consulted and pending evaluation -Continue IV antibiotics initiated in the ED IV vancomycin and Rocephin for now and this was changed to p.o. Augmentin and doxycycline at the recommendations of infectious diseases and will continue for completion of his course. -Follow peripheral blood cultures and MRSA screening test -Currently afebrile with no leukocytosis -Gentle IV fluid hydration LR KCl 40 mill equivalent at 30 cc/h x 2 days while on IV vancomycin -Case was discussed with orthopedic surgery and they will evaluate Monday and Dr. Lajoyce Corners see if he is still here  -WBC and Lactic Acid Level Trend: Recent Labs  Lab 08/09/22 2106 08/10/22 0107 08/11/22 0050 08/12/22 0408  WBC 8.6 7.6 6.0 5.8  LATICACIDVEN 1.9  --   --   --   -Also discussed the case with ID I will review the chart and make further recommendations and they are recommending po Augmentin and Doxy at D/C pending Ortho evaluation -C/w Pain control with p.o. oxycodone 5 mg  every 6 hours as needed for severe pain and bowel regimen with MiraLAX 17 g p.o. daily as needed   Essential Hypertension, BP is not at goal, elevated -The patient reports he did not take his blood pressure medications this morning -Resume home Norvasc -Continue to Hold home diuretics, chlorthalidone, Lasix given AKI and hypokalemia; and defer to PCP to resume -Continue to hold home losartan due to elevated  creatinine -Continue to monitor vital signs and BP per Protocol -Last BP reading was 162/98    QTc prolongation in the setting of Electrolyte Abnormalities  -Presented with QTc of 530 on twelve-lead EKG -Avoid QTc prolonging agents -Optimize magnesium and potassium levels. -Continue to Monitor on remote telemetry in MedSurg unit.   Hyperglycemia -Mild. Glucose Level Trend: Recent Labs  Lab 08/09/22 2106 08/10/22 0107 08/10/22 1543 08/11/22 0050 08/12/22 0408 08/12/22 1406  GLUCOSE 118* 99 85 95 88 96  -Check HbA1c in the AM; Last HbA1c was 5.7 in our system -Conitor for now with repeat CMP within 1 week   Hypokalemia -In the setting of Diuretic Use and will discontinue his chlorthalidone and furosemide for now;  Patient's K+ Level Trend: Recent Labs  Lab 08/09/22 2106 08/10/22 0107 08/10/22 1543 08/11/22 0050 08/12/22 0408 08/12/22 1406  K 2.6* 2.5* 2.9* 2.8* 3.0* 3.3*  -Replete with po KCL 40 mEQ BID x2, IV Kcl 40 mEQ 40 mEQ x1, and IV with LR + 40 mEQ Kcl at 30 mL/hr: Repleted prior to discharging -Continue to Monitor and Replete as Necessary -Repeat CMP within 1 week  AKI, improved -Baseline Cr appears to be around 0.9-1.2 BUN/Cr Trend: Recent Labs  Lab 08/09/22 2106 08/10/22 0107 08/10/22 1543 08/11/22 0050 08/12/22 0408 08/12/22 1406  BUN 16 16 15 16 13 13   CREATININE 1.51* 1.36* 1.31* 1.30* 1.16 1.22  -IV fluid hydration now stopped -Avoid Nephrotoxic Medications, Contrast Dyes, Hypotension and Dehydration to Ensure Adequate Renal Perfusion and will need to Renally Adjust Meds -Continue to Monitor and Trend Renal Function carefully and repeat CMP within 1 week  Normocytic Anemia -Hgb/Hct Trend: Recent Labs  Lab 08/09/22 2106 08/10/22 0107 08/11/22 0050 08/12/22 0408  HGB 12.3* 11.6* 11.6* 11.6*  HCT 39.9 37.4* 37.7* 38.2*  MCV 80.1 80.8 81.6 80.6  -Check Anemia Panel in the AM -Continue to Monitor for S/Sx of Bleeding; No overt bleeding  noted -Repeat CBC within 1 week  Hypoalbuminemia -Patient's Albumin Trend: Recent Labs  Lab 08/11/22 0050 08/12/22 0408  ALBUMIN 2.9* 3.0*  -Continue to Monitor and Trend and repeat CMP within 1 week  Super Morbid Obesity -Complicates overall prognosis and care -Estimated body mass index is 72.15 kg/m as calculated from the following:   Height as of this encounter: 6' (1.829 m).   Weight as of this encounter: 241.3 kg.  -Weight Loss and Dietary Counseling given  Consultants: Discussed with orthopedic surgery Dr. Duwayne Heck and discussed with infectious diseases Dr. Thedore Mins Procedures performed: As delineated above Disposition: Home Diet recommendation:  Discharge Diet Orders (From admission, onward)     Start     Ordered   08/12/22 0000  Diet - low sodium heart healthy        08/12/22 1535           Cardiac diet DISCHARGE MEDICATION: Allergies as of 08/12/2022   No Known Allergies      Medication List     STOP taking these medications    chlorthalidone 50 MG tablet Commonly known as: HYGROTON   furosemide  40 MG tablet Commonly known as: Lasix       TAKE these medications    amLODipine 10 MG tablet Commonly known as: NORVASC Take 1 tablet (10 mg total) by mouth daily.   amoxicillin-clavulanate 875-125 MG tablet Commonly known as: AUGMENTIN Take 1 tablet by mouth every 12 (twelve) hours for 4 days.   doxycycline 100 MG tablet Commonly known as: VIBRA-TABS Take 1 tablet (100 mg total) by mouth every 12 (twelve) hours for 4 days.   losartan 50 MG tablet Commonly known as: Cozaar Take 1 tablet (50 mg total) by mouth daily.   oxyCODONE 5 MG immediate release tablet Commonly known as: Oxy IR/ROXICODONE Take 1 tablet (5 mg total) by mouth every 6 (six) hours as needed for up to 3 days for severe pain.   polyethylene glycol 17 g packet Commonly known as: MIRALAX / GLYCOLAX Take 17 g by mouth daily as needed for mild constipation.                Discharge Care Instructions  (From admission, onward)           Start     Ordered   08/12/22 0000  Discharge wound care:       Comments: Cleanse right leg wound Q day with peroxide, then cover with Aquacel [place] [phone]) and cover with foam dressing.  Change foam dressing Q 3 days or PRN soiling.   08/12/22 1535            Follow-up Information     Pete Pelt, PA-C. Call.   Specialty: Physician Assistant Why: Follow up within 1-2 weeks Contact information: [address] [place] [address] [phone]         Nadara Mustard, MD. Call.   Specialty: Orthopedic Surgery Why: Follow up Next week for Wound Evaluation Contact information: [address] [place] [address] [phone]         [facility]              Follow up.   Why: Call for appointment when you are discharged Contact information: [address] [place] [address] [phone]               Discharge Exam: Ceasar Mons Weights   08/09/22 2308  Weight: (!) 241.3 kg   Vitals:   08/12/22 0739 08/12/22 1452  BP: (!) 162/98 (!) 168/99  Pulse: 69 65  Resp: 17 18  Temp: 97.7 F (36.5 C) 97.8 F (36.6 C)  SpO2: 91% 99%   Examination: Physical Exam:  Constitutional: WN/WD super morbidly obese African-American male in no acute distress appears calm Respiratory: Diminished to auscultation bilaterally, no wheezing, rales, rhonchi or crackles. Normal respiratory effort and patient is not tachypenic. No accessory muscle use.  Labored breathing Cardiovascular: RRR, no murmurs / rubs / gallops. S1 and S2 auscultated.  Has some lower extremity edema which appears chronic Abdomen: Soft, non-tender, non-distended. No masses palpated. No appreciable hepatosplenomegaly. Bowel sounds positive.  GU: Deferred. Musculoskeletal: No clubbing / cyanosis of digits/nails. No joint deformity upper and lower  extremities. Skin: Right posterior wound noted has no more maggots and improving slightly Neurologic: CN 2-12 grossly intact with no focal deficits. Romberg sign and cerebellar reflexes not assessed.  Psychiatric: Normal judgment and insight. Alert and oriented x 3. Normal mood and appropriate affect.   Condition at discharge: stable  The results of significant diagnostics from this hospitalization (including imaging, microbiology, ancillary  and laboratory) are listed below for reference.   Imaging Studies: DG Tibia/Fibula Right  Result Date: 08/09/2022 CLINICAL DATA:  Wound infection EXAM: RIGHT TIBIA AND FIBULA - 2 VIEW COMPARISON:  09/16/2021 FINDINGS: No fracture or malalignment. Diffuse soft tissue edema. No soft tissue emphysema. No periostitis or osseous destructive change. IMPRESSION: Diffuse soft tissue edema. No acute osseous abnormality. Electronically Signed   By: Jasmine Pang M.D.   On: 08/09/2022 21:42    Microbiology: Results for orders placed or performed during the hospital encounter of 08/09/22  Culture, blood (routine x 2)     Status: None (Preliminary result)   Collection Time: 08/09/22  8:49 PM   Specimen: BLOOD  Result Value Ref Range Status   Specimen Description BLOOD LEFT ANTECUBITAL  Final   Special Requests   Final    BOTTLES DRAWN AEROBIC AND ANAEROBIC Blood Culture adequate volume   Culture   Final    NO GROWTH 4 DAYS Performed at [facility], [address] [place], [place] [address]    Report Status PENDING  Incomplete  Culture, blood (routine x 2)     Status: None (Preliminary result)   Collection Time: 08/09/22  8:54 PM   Specimen: BLOOD  Result Value Ref Range Status   Specimen Description BLOOD RIGHT ANTECUBITAL  Final   Special Requests   Final    BOTTLES DRAWN AEROBIC AND ANAEROBIC Blood Culture adequate volume   Culture   Final    NO GROWTH 4 DAYS Performed at [facility], [address], [place], [place] [address]    Report  Status PENDING  Incomplete  MRSA Next Gen by PCR, Nasal     Status: None   Collection Time: 08/10/22 12:21 AM   Specimen: Nasal Mucosa; Nasal Swab  Result Value Ref Range Status   MRSA by PCR Next Gen NOT DETECTED NOT DETECTED Final    Comment: (NOTE) The GeneXpert MRSA Assay (FDA approved for NASAL specimens only), is one component of a comprehensive MRSA colonization surveillance program. It is not intended to diagnose MRSA infection nor to guide or monitor treatment for MRSA infections. Test performance is not FDA approved in patients less than 64 years old. Performed at [facility], [address], [place], [place] [address]    Labs: CBC: Recent Labs  Lab 08/09/22 2106 08/10/22 0107 08/11/22 0050 08/12/22 0408  WBC 8.6 7.6 6.0 5.8  NEUTROABS 4.8  --  2.7 2.3  HGB 12.3* 11.6* 11.6* 11.6*  HCT 39.9 37.4* 37.7* 38.2*  MCV 80.1 80.8 81.6 80.6  PLT 266 226 239 252   Basic Metabolic Panel: Recent Labs  Lab 08/10/22 0107 08/10/22 1543 08/11/22 0050 08/12/22 0408 08/12/22 1406  NA 138 138 136 136 134*  K 2.5* 2.9* 2.8* 3.0* 3.3*  CL 97* 99 99 101 99  CO2 32 30 29 28 28   GLUCOSE 99 85 95 88 96  BUN 16 15 16 13 13   CREATININE 1.36* 1.31* 1.30* 1.16 1.22  CALCIUM 8.3* 8.5* 8.2* 8.2* 8.4*  MG 2.1  --  2.0 1.8  --   PHOS 3.8  --  3.7 3.3  --    Liver Function Tests: Recent Labs  Lab 08/11/22 0050 08/12/22 0408  AST 19 20  ALT 17 17  ALKPHOS 69 65  BILITOT 0.5 0.4  PROT 6.6 6.7  ALBUMIN 2.9* 3.0*   CBG: No results for input(s): "GLUCAP" in the last 168 hours.  Discharge time spent:  greater than 30 minutes.  Signed: Marguerita Merles, DO [facility] 08/13/2022

## 2022-08-12 NOTE — Progress Notes (Signed)
Patient eager to go home today. Notified MD.

## 2022-08-12 NOTE — Plan of Care (Signed)

## 2022-08-13 LAB — HEMOGLOBIN A1C
Hgb A1c MFr Bld: 5.9 % — ABNORMAL HIGH (ref 4.8–5.6)
Mean Plasma Glucose: 123 mg/dL

## 2022-08-14 LAB — CULTURE, BLOOD (ROUTINE X 2)
Culture: NO GROWTH
Culture: NO GROWTH
Special Requests: ADEQUATE
Special Requests: ADEQUATE

## 2022-08-20 ENCOUNTER — Ambulatory Visit (INDEPENDENT_AMBULATORY_CARE_PROVIDER_SITE_OTHER): Payer: BLUE CROSS/BLUE SHIELD | Admitting: Orthopedic Surgery

## 2022-08-20 ENCOUNTER — Encounter: Payer: Self-pay | Admitting: Orthopedic Surgery

## 2022-08-20 DIAGNOSIS — I890 Lymphedema, not elsewhere classified: Secondary | ICD-10-CM

## 2022-08-20 DIAGNOSIS — I872 Venous insufficiency (chronic) (peripheral): Secondary | ICD-10-CM

## 2022-08-20 DIAGNOSIS — L97211 Non-pressure chronic ulcer of right calf limited to breakdown of skin: Secondary | ICD-10-CM
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
## 2022-08-26 ENCOUNTER — Ambulatory Visit: Payer: BLUE CROSS/BLUE SHIELD | Admitting: Orthopedic Surgery

## 2022-08-30 ENCOUNTER — Ambulatory Visit (INDEPENDENT_AMBULATORY_CARE_PROVIDER_SITE_OTHER): Payer: BLUE CROSS/BLUE SHIELD | Admitting: Family

## 2022-08-30 DIAGNOSIS — T07XXXA Unspecified multiple injuries, initial encounter: Secondary | ICD-10-CM

## 2022-08-30 DIAGNOSIS — I872 Venous insufficiency (chronic) (peripheral): Secondary | ICD-10-CM | POA: Diagnosis not present

## 2022-08-30 DIAGNOSIS — I890 Lymphedema, not elsewhere classified: Secondary | ICD-10-CM | POA: Diagnosis not present

## 2022-08-30 DIAGNOSIS — L97211 Non-pressure chronic ulcer of right calf limited to breakdown of skin: Secondary | ICD-10-CM

## 2022-08-31 ENCOUNTER — Encounter: Payer: Self-pay | Admitting: Family
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
## 2022-09-06 ENCOUNTER — Encounter: Payer: Self-pay | Admitting: Family

## 2022-09-06 ENCOUNTER — Ambulatory Visit (INDEPENDENT_AMBULATORY_CARE_PROVIDER_SITE_OTHER): Payer: BLUE CROSS/BLUE SHIELD | Admitting: Family

## 2022-09-06 DIAGNOSIS — I890 Lymphedema, not elsewhere classified: Secondary | ICD-10-CM | POA: Diagnosis not present

## 2022-09-06 DIAGNOSIS — L97211 Non-pressure chronic ulcer of right calf limited to breakdown of skin: Secondary | ICD-10-CM | POA: Diagnosis not present

## 2022-09-06 DIAGNOSIS — I872 Venous insufficiency (chronic) (peripheral): Secondary | ICD-10-CM | POA: Diagnosis not present
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
## 2022-09-12 ENCOUNTER — Ambulatory Visit: Payer: BLUE CROSS/BLUE SHIELD | Admitting: Orthopedic Surgery

## 2022-10-14 ENCOUNTER — Other Ambulatory Visit (INDEPENDENT_AMBULATORY_CARE_PROVIDER_SITE_OTHER): Payer: Self-pay | Admitting: Primary Care

## 2022-10-15 ENCOUNTER — Other Ambulatory Visit: Payer: Self-pay

## 2022-10-15 MED ORDER — IBUPROFEN 800 MG PO TABS
800.0000 mg | ORAL_TABLET | Freq: Three times a day (TID) | ORAL | 0 refills | Status: DC
  Filled 2022-10-15: qty 20, 7d supply, fill #0

## 2022-10-15 MED ORDER — AMOXICILLIN 500 MG PO CAPS
500.0000 mg | ORAL_CAPSULE | Freq: Four times a day (QID) | ORAL | 0 refills | Status: DC
  Filled 2022-10-15: qty 28, 7d supply, fill #0

## 2022-10-16 ENCOUNTER — Other Ambulatory Visit: Payer: Self-pay

## 2022-10-16 ENCOUNTER — Other Ambulatory Visit (HOSPITAL_COMMUNITY): Payer: Self-pay

## 2022-10-21 ENCOUNTER — Other Ambulatory Visit: Payer: Self-pay

## 2022-10-22 ENCOUNTER — Encounter (HOSPITAL_COMMUNITY): Payer: Self-pay | Admitting: *Deleted

## 2022-10-22 ENCOUNTER — Ambulatory Visit (HOSPITAL_COMMUNITY)
Admission: EM | Admit: 2022-10-22 | Discharge: 2022-10-22 | Disposition: A | Discharge status: Home / Self Care | Payer: BLUE CROSS/BLUE SHIELD | Attending: Family Medicine | Admitting: Family Medicine

## 2022-10-22 ENCOUNTER — Other Ambulatory Visit: Payer: Self-pay

## 2022-10-22 DIAGNOSIS — Z5189 Encounter for other specified aftercare: Secondary | ICD-10-CM | POA: Diagnosis not present

## 2022-10-22 DIAGNOSIS — I10 Essential (primary) hypertension: Secondary | ICD-10-CM

## 2022-10-22 MED ORDER — BACITRACIN-POLYMYXIN B 500-10000 UNIT/GM OP OINT
TOPICAL_OINTMENT | Freq: Two times a day (BID) | OPHTHALMIC | 0 refills | Status: DC
  Filled 2022-10-22: qty 3.5, 10d supply, fill #0

## 2022-10-22 MED ORDER — AMLODIPINE BESYLATE 10 MG PO TABS
10.0000 mg | ORAL_TABLET | Freq: Every day | ORAL | 1 refills | Status: DC
  Filled 2022-10-22: qty 30, 30d supply, fill #0
  Filled 2022-12-31 – 2023-03-12 (×2): qty 30, 30d supply, fill #1

## 2022-10-22 MED ORDER — LOSARTAN POTASSIUM 50 MG PO TABS
50.0000 mg | ORAL_TABLET | Freq: Every day | ORAL | 1 refills | Status: DC
  Filled 2022-10-22: qty 30, 30d supply, fill #0
  Filled 2022-12-31 – 2023-03-12 (×2): qty 30, 30d supply, fill #1

## 2022-10-22 NOTE — ED Triage Notes (Signed)
Pt states that he has open wounds on back of both legs he states he seen wound care in 09/2022. He got approved for someone to come out with leg pumps for his legs. He states he is having a lot of pain. He states he took IBU today in the parking lot of clinic.   Pt states he is out of all of his BP meds but he called pcp for an appt but they can't see him until 11/2022

## 2022-10-22 NOTE — Discharge Instructions (Addendum)
I have provided you 2 months worth of your blood pressure medication.  We wrapped your leg.  Please continue to use your leg wrap device daily.  I recommend you keep your appointment with your primary care doctor.  He may consider returning to orthopedics for further management of your lower extremity swelling in addition to seeing wound care.
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
## 2022-10-25 ENCOUNTER — Other Ambulatory Visit: Payer: Self-pay

## 2022-11-13 ENCOUNTER — Emergency Department (HOSPITAL_COMMUNITY)
Admission: EM | Admit: 2022-11-13 | Discharge: 2022-11-13 | Disposition: A | Discharge status: Home / Self Care | Payer: BLUE CROSS/BLUE SHIELD | Attending: Emergency Medicine | Admitting: Emergency Medicine

## 2022-11-13 ENCOUNTER — Encounter (HOSPITAL_COMMUNITY): Payer: Self-pay

## 2022-11-13 ENCOUNTER — Other Ambulatory Visit: Payer: Self-pay

## 2022-11-13 DIAGNOSIS — J029 Acute pharyngitis, unspecified: Secondary | ICD-10-CM | POA: Diagnosis present

## 2022-11-13 DIAGNOSIS — I10 Essential (primary) hypertension: Secondary | ICD-10-CM | POA: Diagnosis not present

## 2022-11-13 DIAGNOSIS — Z79899 Other long term (current) drug therapy: Secondary | ICD-10-CM | POA: Insufficient documentation

## 2022-11-13 DIAGNOSIS — Z20822 Contact with and (suspected) exposure to covid-19: Secondary | ICD-10-CM | POA: Diagnosis not present

## 2022-11-13 DIAGNOSIS — J020 Streptococcal pharyngitis: Secondary | ICD-10-CM | POA: Insufficient documentation

## 2022-11-13 LAB — GROUP A STREP BY PCR: Group A Strep by PCR: DETECTED — AB

## 2022-11-13 LAB — RESP PANEL BY RT-PCR (RSV, FLU A&B, COVID)  RVPGX2
Influenza A by PCR: NEGATIVE
Influenza B by PCR: NEGATIVE
Resp Syncytial Virus by PCR: NEGATIVE
SARS Coronavirus 2 by RT PCR: NEGATIVE

## 2022-11-13 MED ORDER — AMLODIPINE BESYLATE 5 MG PO TABS
10.0000 mg | ORAL_TABLET | Freq: Once | ORAL | Status: AC
  Administered 2022-11-13: 10 mg via ORAL
  Filled 2022-11-13: qty 2

## 2022-11-13 MED ORDER — SODIUM CHLORIDE 0.9 % IV BOLUS: 500.0000 mL | Freq: Once | INTRAVENOUS | Status: DC

## 2022-11-13 MED ORDER — PENICILLIN G BENZATHINE 1200000 UNIT/2ML IM SUSY
1.2000 10*6.[IU] | PREFILLED_SYRINGE | Freq: Once | INTRAMUSCULAR | Status: AC
  Administered 2022-11-13: 1.2 10*6.[IU] via INTRAMUSCULAR
  Filled 2022-11-13: qty 2

## 2022-11-13 MED ORDER — LOSARTAN POTASSIUM 25 MG PO TABS
50.0000 mg | ORAL_TABLET | Freq: Once | ORAL | Status: AC
  Administered 2022-11-13: 50 mg via ORAL
  Filled 2022-11-13: qty 2

## 2022-11-13 MED ORDER — DEXAMETHASONE SODIUM PHOSPHATE 10 MG/ML IJ SOLN: 10.0000 mg | Freq: Once | INTRAMUSCULAR | Status: DC

## 2022-11-13 NOTE — ED Provider Notes (Signed)
[place] EMERGENCY DEPARTMENT AT [facility] Provider Note   CSN: [id number] Arrival date & time: 11/13/22  1211     History  Chief Complaint  Patient presents with   Sore Throat    Jared Mccoy is a 32 y.o. adult with PMHx HTN and PVD who presents to ED concerned for congestion and sore throat x4 days. Patient stating that it hurts to swallow, but he is able to tolerate soups and fluids. No respiratory complaint. No other complaint today.  Patient does have a significantly high BP here in ED. States that he has not been able to take his BP medications in 3 days. Denying any red flag symptoms of HTN such as headache, vision changes, SOB, chest pain.   Sore Throat       Home Medications Prior to Admission medications   Medication Sig Start Date End Date Taking? Authorizing Provider  amLODipine (NORVASC) 10 MG tablet Take 1 tablet (10 mg total) by mouth daily. 10/22/22   Shelby Mattocks, DO  amoxicillin (AMOXIL) 500 MG capsule Take 1 capsule (500 mg total) by mouth every 6 (six) hours. 10/14/22     ibuprofen (ADVIL) 800 MG tablet Take 1 tablet (800 mg total) by mouth every 8 (eight) hours with food or milk. 10/15/22     losartan (COZAAR) 50 MG tablet Take 1 tablet (50 mg total) by mouth daily. 10/22/22   Shelby Mattocks, DO  polyethylene glycol (MIRALAX / GLYCOLAX) 17 g packet Take 17 g by mouth daily as needed for mild constipation. 08/12/22   Merlene Laughter, DO      Allergies    Patient has no known allergies.    Review of Systems   Review of Systems  HENT:  Positive for sore throat.     Physical Exam Updated Vital Signs BP (!) 215/119   Pulse 87   Temp 98.5 F (36.9 C) (Oral)   Resp 18   Ht 6' (1.829 m)   Wt (!) 244.9 kg   SpO2 97%   BMI 73.24 kg/m  Physical Exam Vitals and nursing note reviewed.  Constitutional:      General: He is not in acute distress.    Appearance: He is not ill-appearing or toxic-appearing.  HENT:     Head: Normocephalic  and atraumatic.     Mouth/Throat:     Tonsils: Tonsillar exudate present. No tonsillar abscesses. 3+ on the right. 3+ on the left.     Comments: Oropharyngeal erythema. Patient is tolerating his oral secretions. Tonsillar swelling is equal in size without obvious concerns for tonsillar abscess.  Eyes:     General: No scleral icterus.       Right eye: No discharge.        Left eye: No discharge.     Conjunctiva/sclera: Conjunctivae normal.  Cardiovascular:     Rate and Rhythm: Normal rate.  Pulmonary:     Effort: Pulmonary effort is normal.  Abdominal:     General: Abdomen is flat.  Skin:    General: Skin is warm and dry.  Neurological:     General: No focal deficit present.     Mental Status: He is alert. Mental status is at baseline.  Psychiatric:        Mood and Affect: Mood normal.        Behavior: Behavior normal.     ED Results / Procedures / Treatments   Labs (all labs ordered are listed, but only abnormal results are displayed)  Labs Reviewed  GROUP A STREP BY PCR - Abnormal; Notable for the following components:      Result Value   Group A Strep by PCR DETECTED (*)    All other components within normal limits  RESP PANEL BY RT-PCR (RSV, FLU A&B, COVID)  [id number]    EKG None  Radiology No results found.  Procedures Procedures    Medications Ordered in ED Medications  penicillin g benzathine (BICILLIN LA) 1200000 UNIT/2ML injection 1.2 Million Units (1.2 Million Units Intramuscular Given 11/13/22 1427)  amLODipine (NORVASC) tablet 10 mg (10 mg Oral Given 11/13/22 1427)  losartan (COZAAR) tablet 50 mg (50 mg Oral Given 11/13/22 1427)    ED Course/ Medical Decision Making/ A&P                                 Medical Decision Making Risk Prescription drug management.   This patient presents to the ED for concern of sore throat and congestion, this involves an extensive number of treatment options, and is a complaint that carries with it a high risk of  complications and morbidity.  The differential diagnosis includes strep pharyngitis, peritonsillar abscess, URI/viral illness, deep space infection   Co morbidities that complicate the patient evaluation  HTN, PVD   Additional history obtained:  Dr. Randa Evens PCP   Lab Tests:  I Ordered, and personally interpreted labs.  The pertinent results include:   - strep PCR: positive - Resp panel: negative   Problem List / ED Course / Critical interventions / Medication management  Patient presents to ED concerned for congestion and sore throat. Physical exam with +3 tonsillar swelling, tonsillar exudate, and tonsillar erythema. Patient is able to tolerate oral fluids and denies respiratory complaint. Strep PCR positive. Resp panel negative. Provided patient with bicillin shot in ED which he tolerated well. Could not provide steroids d/t HTN.  Of note, patient with significant HTN in ED around 197/123. Patient stating that he has not taken his BP pills in 3 days. Denying any red flag HTN symptoms. Provided patient with his home meds in the ED. Recommended following up with PCP. Patient stating that he has an upcoming apt with his PCP this month. Provided return precautions for worsening strep and for HTN emergency. Patient states that he is ready to go home. I have reviewed the patients home medicines and have made adjustments as needed Provided patient with return precautions.  Discharged in good condition.   Social Determinants of Health:  none           Final Clinical Impression(s) / ED Diagnoses Final diagnoses:  Strep pharyngitis  Primary hypertension    Rx / DC Orders ED Discharge Orders     None         Margarita Rana 11/13/22 2224    Gwyneth Sprout, MD 11/16/22 [phone]

## 2022-11-13 NOTE — Discharge Instructions (Addendum)
It was a pleasure caring for you today. Strep test was positive. You have received your antibiotic dose. Symptoms should start to improve over the next 48 hours. Please follow up with your primary care provider for your high blood pressure. Seek emergency care if experiencing any new or worsening symptoms.

## 2022-11-13 NOTE — ED Triage Notes (Addendum)
Patient report congestion and sore throat x 4 days. Denies fevers and cough. Took home Covid test and it was negative.   Patient has hx of HTN, has not taken his medication in several days d/t the sore throat.

## 2022-12-31 ENCOUNTER — Other Ambulatory Visit: Payer: Self-pay

## 2023-01-01 ENCOUNTER — Other Ambulatory Visit: Payer: Self-pay

## 2023-01-07 ENCOUNTER — Ambulatory Visit (INDEPENDENT_AMBULATORY_CARE_PROVIDER_SITE_OTHER): Payer: BLUE CROSS/BLUE SHIELD | Admitting: Primary Care

## 2023-01-08 ENCOUNTER — Ambulatory Visit: Payer: BLUE CROSS/BLUE SHIELD | Admitting: Family

## 2023-01-10 ENCOUNTER — Other Ambulatory Visit: Payer: Self-pay

## 2023-01-14 ENCOUNTER — Ambulatory Visit (INDEPENDENT_AMBULATORY_CARE_PROVIDER_SITE_OTHER): Payer: BLUE CROSS/BLUE SHIELD | Admitting: Orthopedic Surgery

## 2023-01-14 DIAGNOSIS — L97211 Non-pressure chronic ulcer of right calf limited to breakdown of skin: Secondary | ICD-10-CM

## 2023-01-14 DIAGNOSIS — I890 Lymphedema, not elsewhere classified: Secondary | ICD-10-CM | POA: Diagnosis not present

## 2023-01-14 DIAGNOSIS — I872 Venous insufficiency (chronic) (peripheral): Secondary | ICD-10-CM | POA: Diagnosis not present

## 2023-01-15 ENCOUNTER — Encounter: Payer: Self-pay | Admitting: Orthopedic Surgery
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
## 2023-01-28 ENCOUNTER — Other Ambulatory Visit (HOSPITAL_COMMUNITY): Payer: Self-pay

## 2023-01-28 MED ORDER — AMOXICILLIN 500 MG PO CAPS
500.0000 mg | ORAL_CAPSULE | Freq: Three times a day (TID) | ORAL | 0 refills | Status: DC
  Filled 2023-01-28 – 2023-03-12 (×2): qty 21, 7d supply, fill #0

## 2023-02-07 ENCOUNTER — Other Ambulatory Visit (HOSPITAL_COMMUNITY): Payer: Self-pay

## 2023-03-12 ENCOUNTER — Other Ambulatory Visit: Payer: Self-pay

## 2023-03-13 ENCOUNTER — Other Ambulatory Visit: Payer: Self-pay

## 2023-03-20 ENCOUNTER — Telehealth (INDEPENDENT_AMBULATORY_CARE_PROVIDER_SITE_OTHER): Payer: Self-pay | Admitting: Primary Care

## 2023-03-20 NOTE — Telephone Encounter (Signed)
 Called to remind pt about ap. Pt will be present.

## 2023-03-21 ENCOUNTER — Telehealth (INDEPENDENT_AMBULATORY_CARE_PROVIDER_SITE_OTHER): Payer: Self-pay | Admitting: Primary Care

## 2023-03-21 ENCOUNTER — Ambulatory Visit (INDEPENDENT_AMBULATORY_CARE_PROVIDER_SITE_OTHER): Payer: BLUE CROSS/BLUE SHIELD | Admitting: Primary Care

## 2023-03-21 NOTE — Telephone Encounter (Signed)
 Pt came in at 10:30. He was late for atp and we had to reschedule

## 2023-03-27 ENCOUNTER — Ambulatory Visit (INDEPENDENT_AMBULATORY_CARE_PROVIDER_SITE_OTHER): Payer: BLUE CROSS/BLUE SHIELD | Admitting: Primary Care

## 2023-03-27 ENCOUNTER — Telehealth (INDEPENDENT_AMBULATORY_CARE_PROVIDER_SITE_OTHER): Payer: Self-pay | Admitting: Primary Care

## 2023-03-27 NOTE — Telephone Encounter (Signed)
Called pt to see if he wanted to reschedule atp. Pt picked up the phone and hung up.

## 2023-04-06 ENCOUNTER — Other Ambulatory Visit: Payer: Self-pay

## 2023-04-06 ENCOUNTER — Encounter (HOSPITAL_COMMUNITY): Payer: Self-pay | Admitting: Emergency Medicine

## 2023-04-06 ENCOUNTER — Inpatient Hospital Stay (HOSPITAL_COMMUNITY)
Admission: EM | Admit: 2023-04-06 | Discharge: 2023-04-08 | DRG: 871 | Discharge status: Against Medical Advice | Payer: BLUE CROSS/BLUE SHIELD | Attending: Internal Medicine | Admitting: Internal Medicine

## 2023-04-06 ENCOUNTER — Emergency Department (HOSPITAL_COMMUNITY): Payer: BLUE CROSS/BLUE SHIELD

## 2023-04-06 DIAGNOSIS — J9601 Acute respiratory failure with hypoxia: Secondary | ICD-10-CM | POA: Diagnosis present

## 2023-04-06 DIAGNOSIS — L03115 Cellulitis of right lower limb: Secondary | ICD-10-CM | POA: Diagnosis present

## 2023-04-06 DIAGNOSIS — G8929 Other chronic pain: Secondary | ICD-10-CM | POA: Diagnosis present

## 2023-04-06 DIAGNOSIS — Z79899 Other long term (current) drug therapy: Secondary | ICD-10-CM | POA: Diagnosis not present

## 2023-04-06 DIAGNOSIS — Z6841 Body Mass Index (BMI) 40.0 and over, adult: Secondary | ICD-10-CM | POA: Diagnosis not present

## 2023-04-06 DIAGNOSIS — I878 Other specified disorders of veins: Secondary | ICD-10-CM | POA: Diagnosis present

## 2023-04-06 DIAGNOSIS — E662 Morbid (severe) obesity with alveolar hypoventilation: Secondary | ICD-10-CM | POA: Diagnosis present

## 2023-04-06 DIAGNOSIS — J101 Influenza due to other identified influenza virus with other respiratory manifestations: Secondary | ICD-10-CM | POA: Diagnosis present

## 2023-04-06 DIAGNOSIS — I10 Essential (primary) hypertension: Secondary | ICD-10-CM | POA: Diagnosis present

## 2023-04-06 DIAGNOSIS — L0390 Cellulitis, unspecified: Secondary | ICD-10-CM

## 2023-04-06 DIAGNOSIS — I890 Lymphedema, not elsewhere classified: Secondary | ICD-10-CM | POA: Diagnosis present

## 2023-04-06 DIAGNOSIS — N179 Acute kidney failure, unspecified: Secondary | ICD-10-CM | POA: Diagnosis present

## 2023-04-06 DIAGNOSIS — R6520 Severe sepsis without septic shock: Secondary | ICD-10-CM | POA: Diagnosis present

## 2023-04-06 DIAGNOSIS — L03116 Cellulitis of left lower limb: Secondary | ICD-10-CM | POA: Diagnosis present

## 2023-04-06 DIAGNOSIS — Z5329 Procedure and treatment not carried out because of patient's decision for other reasons: Secondary | ICD-10-CM | POA: Diagnosis present

## 2023-04-06 DIAGNOSIS — B954 Other streptococcus as the cause of diseases classified elsewhere: Secondary | ICD-10-CM | POA: Diagnosis present

## 2023-04-06 DIAGNOSIS — I739 Peripheral vascular disease, unspecified: Secondary | ICD-10-CM | POA: Diagnosis present

## 2023-04-06 DIAGNOSIS — D72829 Elevated white blood cell count, unspecified: Secondary | ICD-10-CM | POA: Insufficient documentation

## 2023-04-06 DIAGNOSIS — Z87891 Personal history of nicotine dependence: Secondary | ICD-10-CM | POA: Diagnosis not present

## 2023-04-06 DIAGNOSIS — A419 Sepsis, unspecified organism: Principal | ICD-10-CM | POA: Diagnosis present

## 2023-04-06 DIAGNOSIS — E876 Hypokalemia: Secondary | ICD-10-CM | POA: Diagnosis present

## 2023-04-06 DIAGNOSIS — Z1152 Encounter for screening for COVID-19: Secondary | ICD-10-CM

## 2023-04-06 LAB — CBC WITH DIFFERENTIAL/PLATELET
Abs Immature Granulocytes: 0.38 10*3/uL — ABNORMAL HIGH (ref 0.00–0.07)
Basophils Absolute: 0 10*3/uL (ref 0.0–0.1)
Basophils Relative: 0 %
Eosinophils Absolute: 0 10*3/uL (ref 0.0–0.5)
Eosinophils Relative: 0 %
HCT: 36.1 % — ABNORMAL LOW (ref 39.0–52.0)
Hemoglobin: 11.1 g/dL — ABNORMAL LOW (ref 13.0–17.0)
Immature Granulocytes: 2 %
Lymphocytes Relative: 6 %
Lymphs Abs: 0.9 10*3/uL (ref 0.7–4.0)
MCH: 24.8 pg — ABNORMAL LOW (ref 26.0–34.0)
MCHC: 30.7 g/dL (ref 30.0–36.0)
MCV: 80.8 fL (ref 80.0–100.0)
Monocytes Absolute: 0.7 10*3/uL (ref 0.1–1.0)
Monocytes Relative: 4 %
Neutro Abs: 14 10*3/uL — ABNORMAL HIGH (ref 1.7–7.7)
Neutrophils Relative %: 88 %
Platelets: 195 10*3/uL (ref 150–400)
RBC: 4.47 MIL/uL (ref 4.22–5.81)
RDW: 17.1 % — ABNORMAL HIGH (ref 11.5–15.5)
WBC: 16 10*3/uL — ABNORMAL HIGH (ref 4.0–10.5)
nRBC: 0 % (ref 0.0–0.2)

## 2023-04-06 LAB — COMPREHENSIVE METABOLIC PANEL
ALT: 17 U/L (ref 0–44)
AST: 23 U/L (ref 15–41)
Albumin: 3.3 g/dL — ABNORMAL LOW (ref 3.5–5.0)
Alkaline Phosphatase: 58 U/L (ref 38–126)
Anion gap: 9 (ref 5–15)
BUN: 16 mg/dL (ref 6–20)
CO2: 29 mmol/L (ref 22–32)
Calcium: 8.5 mg/dL — ABNORMAL LOW (ref 8.9–10.3)
Chloride: 98 mmol/L (ref 98–111)
Creatinine, Ser: 1.28 mg/dL — ABNORMAL HIGH (ref 0.61–1.24)
GFR, Estimated: >60 mL/min (ref >60)
Glucose, Bld: 105 mg/dL — ABNORMAL HIGH (ref 70–99)
Potassium: 2.9 mmol/L — ABNORMAL LOW (ref 3.5–5.1)
Sodium: 136 mmol/L (ref 135–145)
Total Bilirubin: 0.7 mg/dL (ref 0.0–1.2)
Total Protein: 7.6 g/dL (ref 6.5–8.1)

## 2023-04-06 LAB — RESP PANEL BY RT-PCR (RSV, FLU A&B, COVID)  RVPGX2
Influenza A by PCR: POSITIVE — AB
Influenza B by PCR: NEGATIVE
Resp Syncytial Virus by PCR: NEGATIVE
SARS Coronavirus 2 by RT PCR: NEGATIVE

## 2023-04-06 LAB — MAGNESIUM: Magnesium: 1.4 mg/dL — ABNORMAL LOW (ref 1.7–2.4)

## 2023-04-06 MED ORDER — VANCOMYCIN HCL 2000 MG/400ML IV SOLN
2000.0000 mg | Freq: Once | INTRAVENOUS | Status: AC
  Administered 2023-04-06: 2000 mg via INTRAVENOUS
  Filled 2023-04-06: qty 400

## 2023-04-06 MED ORDER — ACETAMINOPHEN 325 MG PO TABS: 325.0000 mg | ORAL_TABLET | Freq: Four times a day (QID) | ORAL | Status: DC | PRN

## 2023-04-06 MED ORDER — SODIUM CHLORIDE 0.9 % IV BOLUS
500.0000 mL | Freq: Once | INTRAVENOUS | Status: AC
  Administered 2023-04-06: 500 mL via INTRAVENOUS

## 2023-04-06 MED ORDER — ONDANSETRON HCL 4 MG/2ML IJ SOLN
4.0000 mg | Freq: Once | INTRAMUSCULAR | Status: AC
  Administered 2023-04-06: 4 mg via INTRAVENOUS
  Filled 2023-04-06: qty 2

## 2023-04-06 MED ORDER — FENTANYL CITRATE PF 50 MCG/ML IJ SOSY
50.0000 ug | PREFILLED_SYRINGE | Freq: Once | INTRAMUSCULAR | Status: AC
  Administered 2023-04-06: 50 ug via INTRAVENOUS
  Filled 2023-04-06: qty 1

## 2023-04-06 MED ORDER — ACETAMINOPHEN 325 MG PO TABS
650.0000 mg | ORAL_TABLET | Freq: Four times a day (QID) | ORAL | Status: DC | PRN
  Administered 2023-04-06 – 2023-04-08 (×3): 650 mg via ORAL
  Filled 2023-04-06 (×3): qty 2

## 2023-04-06 MED ORDER — VANCOMYCIN HCL 2000 MG/400ML IV SOLN
2000.0000 mg | Freq: Two times a day (BID) | INTRAVENOUS | Status: DC
  Administered 2023-04-07: 2000 mg via INTRAVENOUS
  Filled 2023-04-06 (×2): qty 400

## 2023-04-06 MED ORDER — IPRATROPIUM-ALBUTEROL 0.5-2.5 (3) MG/3ML IN SOLN: 3.0000 mL | Freq: Four times a day (QID) | RESPIRATORY_TRACT | Status: DC | PRN

## 2023-04-06 MED ORDER — OSELTAMIVIR PHOSPHATE 75 MG PO CAPS
75.0000 mg | ORAL_CAPSULE | Freq: Two times a day (BID) | ORAL | Status: DC
  Administered 2023-04-06 – 2023-04-08 (×4): 75 mg via ORAL
  Filled 2023-04-06 (×4): qty 1

## 2023-04-06 MED ORDER — IPRATROPIUM-ALBUTEROL 0.5-2.5 (3) MG/3ML IN SOLN
3.0000 mL | Freq: Four times a day (QID) | RESPIRATORY_TRACT | Status: DC
  Administered 2023-04-06 – 2023-04-07 (×3): 3 mL via RESPIRATORY_TRACT
  Filled 2023-04-06 (×3): qty 3

## 2023-04-06 MED ORDER — POTASSIUM CHLORIDE CRYS ER 20 MEQ PO TBCR
40.0000 meq | EXTENDED_RELEASE_TABLET | Freq: Once | ORAL | Status: AC
  Administered 2023-04-06: 40 meq via ORAL
  Filled 2023-04-06: qty 2

## 2023-04-06 MED ORDER — OXYCODONE HCL 5 MG PO TABS
5.0000 mg | ORAL_TABLET | Freq: Four times a day (QID) | ORAL | Status: DC | PRN
  Administered 2023-04-07: 5 mg via ORAL
  Filled 2023-04-06: qty 1

## 2023-04-06 MED ORDER — MORPHINE SULFATE (PF) 2 MG/ML IV SOLN: 2.0000 mg | INTRAVENOUS | Status: DC | PRN

## 2023-04-06 MED ORDER — SODIUM CHLORIDE 0.9 % IV SOLN
2.0000 g | INTRAVENOUS | Status: DC
  Administered 2023-04-06 – 2023-04-07 (×2): 2 g via INTRAVENOUS
  Filled 2023-04-06 (×2): qty 20

## 2023-04-06 MED ORDER — SODIUM CHLORIDE 0.9 % IV SOLN: INTRAVENOUS | Status: DC

## 2023-04-06 MED ORDER — ENOXAPARIN SODIUM 120 MG/0.8ML IJ SOSY
105.0000 mg | PREFILLED_SYRINGE | INTRAMUSCULAR | Status: DC
  Administered 2023-04-06: 105 mg via SUBCUTANEOUS
  Filled 2023-04-06 (×2): qty 0.7

## 2023-04-06 MED ORDER — ACETAMINOPHEN 650 MG RE SUPP: 650.0000 mg | Freq: Four times a day (QID) | RECTAL | Status: DC | PRN

## 2023-04-06 MED ORDER — AMLODIPINE BESYLATE 5 MG PO TABS
10.0000 mg | ORAL_TABLET | Freq: Every day | ORAL | Status: DC
  Administered 2023-04-06 – 2023-04-08 (×3): 10 mg via ORAL
  Filled 2023-04-06 (×3): qty 2

## 2023-04-06 MED ORDER — MORPHINE SULFATE (PF) 4 MG/ML IV SOLN: 3.0000 mg | INTRAVENOUS | Status: DC | PRN

## 2023-04-06 MED ORDER — SODIUM CHLORIDE 0.9% FLUSH: 10.0000 mL | INTRAVENOUS | Status: DC | PRN

## 2023-04-06 NOTE — Progress Notes (Signed)
 Pharmacy Antibiotic Note  Jared Mccoy is a 33 y.o. adult admitted on 04/06/2023 with sepsis secondary to flu vs cellulitis. Pharmacy has been consulted for vancomycin dosing.  WBC 16, Temp 102, Scr 1.28 (calculated CrCl ~89 ml/min based on adjusted body weight). Blood cultures pending collection.  Plan: Continue ceftriaxone 2g IV q24hrs Give vancomycin 2g IV x1, followed by vanc 2000mg  IV q12hrs (eAUC 449 using Scr 1.28, IBW, Vd 0.5) Monitor renal function, cultures, and overall clinical picture  Height: 6' (182.9 cm) Weight: (!) 226.8 kg (500 lb) IBW/kg (Calculated) : 77.6  Temp (24hrs), Avg:102.4 F (39.1 C), Min:102.4 F (39.1 C), Max:102.4 F (39.1 C)  Recent Labs  Lab 04/06/23 1543  WBC 16.0*  CREATININE 1.28*    Estimated Creatinine Clearance (by C-G formula based on SCr of 1.28 mg/dL (H)) Male: 161.0 mL/min (A) Male: 157.9 mL/min (A)    No Known Allergies  Antimicrobials this admission: 2/23 ceftriaxone >>  2/23 vancomycin >>   Dose adjustments this admission: N/A  Microbiology results: 2/23 BCx: not yet collected   Thank you for allowing pharmacy to be a part of this patient's care.  Cherylin Mylar, PharmD Clinical Pharmacist  2/23/20256:17 PM

## 2023-04-06 NOTE — ED Provider Notes (Signed)
 [place] [facility]ERGENCY DEPARTMENT AT [facility] Provider Note   CSN: [id number] Arrival date & time: 04/06/23  1402     History  Chief Complaint  Patient presents with   URI   Fever    Jared Mccoy is a 33 y.o. adult.  Patient with flulike symptoms last few days.  Sick contacts at home.  Feels worse today.  He has had some nausea and loose stools but denies vomiting.  He has chronic lymphedema in his legs chronic pain in his legs but his main concern is for his full body aches fever.  He has a history of obesity hypertension peripheral vascular disease with chronic edema in his legs.  He denies any headache chest pain shortness of breath.  Generally weak.  Lives at home with family.  The history is provided by the patient.       Home Medications Prior to Admission medications   Medication Sig Start Date End Date Taking? Authorizing Provider  amLODipine (NORVASC) 10 MG tablet Take 1 tablet (10 mg total) by mouth daily. 10/22/22   Shelby Mattocks, DO  amoxicillin (AMOXIL) 500 MG capsule Take 1 capsule (500 mg total) by mouth every 6 (six) hours. 10/14/22     amoxicillin (AMOXIL) 500 MG capsule Take 1 capsule (500 mg total) by mouth 3 (three) times daily. 01/28/23     ibuprofen (ADVIL) 800 MG tablet Take 1 tablet (800 mg total) by mouth every 8 (eight) hours with food or milk. 10/15/22     losartan (COZAAR) 50 MG tablet Take 1 tablet (50 mg total) by mouth daily. 10/22/22   Shelby Mattocks, DO  polyethylene glycol (MIRALAX / GLYCOLAX) 17 g packet Take 17 g by mouth daily as needed for mild constipation. 08/12/22   Merlene Laughter, DO      Allergies    Patient has no known allergies.    Review of Systems   Review of Systems  Physical Exam Updated Vital Signs BP (!) 161/86 (BP Location: Left Arm)   Pulse (!) 112   Temp (!) 102.4 F (39.1 C) (Oral)   Resp 18   Ht 6' (1.829 m)   Wt (!) 226.8 kg   SpO2 95%   BMI 67.81 kg/m  Physical Exam Vitals and nursing note  reviewed.  Constitutional:      General: He is not in acute distress.    Appearance: He is well-developed. He is ill-appearing.  HENT:     Head: Normocephalic and atraumatic.     Nose: Nose normal.     Mouth/Throat:     Mouth: Mucous membranes are moist.  Eyes:     Extraocular Movements: Extraocular movements intact.     Conjunctiva/sclera: Conjunctivae normal.     Pupils: Pupils are equal, round, and reactive to light.  Cardiovascular:     Rate and Rhythm: Regular rhythm. Tachycardia present.     Pulses: Normal pulses.     Heart sounds: Normal heart sounds. No murmur heard. Pulmonary:     Effort: Pulmonary effort is normal. No respiratory distress.     Breath sounds: Normal breath sounds.  Abdominal:     Palpations: Abdomen is soft.     Tenderness: There is no abdominal tenderness.  Musculoskeletal:        General: No swelling. Normal range of motion.     Cervical back: Neck supple.  Skin:    General: Skin is warm and dry.     Capillary Refill: Capillary refill takes less  than 2 seconds.     Comments: Chronic venous stasis changes to bilateral lower legs did not appreciate any obvious purulent drainage  Neurological:     Mental Status: He is alert.  Psychiatric:        Mood and Affect: Mood normal.     ED Results / Procedures / Treatments   Labs (all labs ordered are listed, but only abnormal results are displayed) Labs Reviewed  RESP PANEL BY RT-PCR (RSV, FLU A&B, COVID)  RVPGX2 - Abnormal; Notable for the following components:      Result Value   Influenza A by PCR POSITIVE (*)    All other components within normal limits  CBC WITH DIFFERENTIAL/PLATELET - Abnormal; Notable for the following components:   WBC 16.0 (*)    Hemoglobin 11.1 (*)    HCT 36.1 (*)    MCH 24.8 (*)    RDW 17.1 (*)    Neutro Abs 14.0 (*)    Abs Immature Granulocytes 0.38 (*)    All other components within normal limits  COMPREHENSIVE METABOLIC PANEL - Abnormal; Notable for the following  components:   Potassium 2.9 (*)    Glucose, Bld 105 (*)    Creatinine, Ser 1.28 (*)    Calcium 8.5 (*)    Albumin 3.3 (*)    All other components within normal limits  CULTURE, BLOOD (ROUTINE X 2)  CULTURE, BLOOD (ROUTINE X 2)  BASIC METABOLIC PANEL  CBC  MAGNESIUM    EKG None  Radiology DG Chest Portable 1 View Result Date: 04/06/2023 CLINICAL DATA:  Cough. EXAM: PORTABLE CHEST 1 VIEW COMPARISON:  September 16, 2021. FINDINGS: Stable cardiomegaly. Both lungs are clear. The visualized skeletal structures are unremarkable. IMPRESSION: No active disease. Electronically Signed   By: Lupita Raider M.D.   On: 04/06/2023 15:42    Procedures Procedures    Medications Ordered in ED Medications  fentaNYL (SUBLIMAZE) injection 50 mcg (has no administration in time range)  amLODipine (NORVASC) tablet 10 mg (has no administration in time range)  enoxaparin (LOVENOX) injection 105 mg (has no administration in time range)  0.9 %  sodium chloride infusion (has no administration in time range)  acetaminophen (TYLENOL) tablet 650 mg (has no administration in time range)    Or  acetaminophen (TYLENOL) suppository 650 mg (has no administration in time range)  morphine (PF) 2 MG/ML injection 2 mg (has no administration in time range)  cefTRIAXone (ROCEPHIN) 2 g in sodium chloride 0.9 % 100 mL IVPB (has no administration in time range)  ipratropium-albuterol (DUONEB) 0.5-2.5 (3) MG/3ML nebulizer solution 3 mL (has no administration in time range)  potassium chloride SA (KLOR-CON M) CR tablet 40 mEq (has no administration in time range)  oseltamivir (TAMIFLU) capsule 75 mg (has no administration in time range)  ondansetron (ZOFRAN) injection 4 mg (4 mg Intravenous Given 04/06/23 1647)  sodium chloride 0.9 % bolus 500 mL (500 mLs Intravenous New Bag/Given 04/06/23 1646)  potassium chloride SA (KLOR-CON M) CR tablet 40 mEq (40 mEq Oral Given 04/06/23 1647)    ED Course/ Medical Decision Making/ A&P                                  Medical Decision Making Amount and/or Complexity of Data Reviewed Labs: ordered. Radiology: ordered.  Risk OTC drugs. Prescription drug management. Decision regarding hospitalization.   Jared Mccoy is here with flulike symptoms.  Patient  febrile tachycardic but otherwise unremarkable vitals.  History of hypertension obesity peripheral vascular disease and chronic venous stasis in his legs.  Multiple sick contacts at home.  Flulike symptoms and his kids.  He complains of bodyaches pain all over chronic pain in his legs but flulike symptoms.  Differential diagnosis likely flu RSV or COVID.  I do not appreciate any obvious cellulitis in his lower legs.  Denies any abdominal pain.  Has had a cough.  Denies any vomiting but he does feel nauseous has had some loose stools.  He is felt bad for the last 2 or 3 days.  Worse today.  Overall differential diagnosis likely flulike symptoms we will check RSV COVID flu panel will get basic labs chest x-ray give IV fluids antipyretics nausea medicine.  Lab work for my review and interpretation shows white count of 16.  Sodium unremarkable.  Potassium 2.9.  Overall patient with no pneumonia pneumothorax.  But does have influenza A.  Reevaluation I talked with family they are concerned that the right lower leg looks a little bit worse than normal and I thought they could be a little bit of cellulitis going on but there is not any major purulent drainage.  He still very symptomatic from body aches and pains and fever from the flu I called the hospitalist and overall shared decision was made to admit him for some IV antibiotics for cellulitis treat the flu symptomatic support.  Wound care.  This chart was dictated using voice recognition software.  Despite best efforts to proofread,  errors can occur which can change the documentation meaning.         Final Clinical Impression(s) / ED Diagnoses Final diagnoses:  Influenza A   Cellulitis, unspecified cellulitis site    Rx / DC Orders ED Discharge Orders     None         Virgina Norfolk, DO 04/06/23 1749

## 2023-04-06 NOTE — ED Triage Notes (Signed)
 Pt has chronic leg pain, swelling. Previously taking lasix but doctor stopped prescribing it. Also complaints of flu like symptoms. Temp 102.1 from EMS. EMS gave 650 tylenol. Denies N/V/D.  BP 203/126 113 95% 144 CBG T 102.1 Tylenol 650

## 2023-04-06 NOTE — H&P (Signed)
 History and Physical    HAGOP MCCOLLAM WUJ:[id number] DOB: [birth year] DOA: 04/06/2023  PCP: Pete Pelt, PA-C   Patient coming from: Home    Chief Complaint: Fever, bilateral lower extremity pain, swelling, flulike symptoms  HPI: Jared Mccoy is a 33 y.o. adult with medical history significant of super morbid obesity, bilateral lower extremity edema, hypertension who presented to the emergency department with complaints of flulike symptoms.  He reported fever at home.  He felt gradually worse.  He developed body aches.  He had sick contacts at home with similar symptoms.  His  symptoms started worsening today.  He also reported nausea, loose stools.  Patient has history of chronic lymphedema on bilateral lower extremities.  He was admitted last time on July 24 for evaluation of wounds of bilateral legs. His lower extremities have been more swollen, erythematous, tender more on the left and also has developed some ulcers on the ventral surface . On presentation, patient was febrile with temperature of 102.  He was tachycardic.  Blood pressure was on the higher side.  Patient also reported cough.  Influenza A came out to be positive. Patient seen and examined at bedside in the emergency room on.he was lying on the bed.  Sister was at bedside.  Complains of severe body aches, pain in the bilateral lower extremities. At baseline, he is ambulatory. No history of chest pain, abdominal pain, vomiting, dysuria, hematochezia or melena.  ED Course: Febrile in the ED with sinus tachycardia, hypertension.  He was saturating fine on room air.  Lab work showed potassium of 2.9, creatinine of 1.28, WC count of 16.  Patient being admitted for the management of sepsis secondary to cellulitis of lower extremity and also flu.  Review of Systems: As per HPI otherwise 10 point review of systems negative.    Past Medical History:  Diagnosis Date   Hypertension    not on medication   Obese     Peripheral vascular disease (HCC)    edema in legs     Past Surgical History:  Procedure Laterality Date   MANDIBULAR HARDWARE REMOVAL N/A 12/27/2016   Procedure: MANDIBULAR HARDWARE REMOVAL;  Surgeon: Newman Pies, MD;  Location: [facility] OR;  Service: ENT;  Laterality: N/A;   OPEN REDUCTION INTERNAL FIXATION (ORIF) DISTAL RADIAL FRACTURE Left 11/10/2016   Procedure: OPEN REDUCTION INTERNAL FIXATION (ORIF) DISTAL RADIAL FRACTURE;  Surgeon: Mack Hook, MD;  Location: [facility] OR;  Service: Orthopedics;  Laterality: Left;   ORIF MANDIBULAR FRACTURE Right 11/10/2016   Procedure: OPEN REDUCTION INTERNAL FIXATION (ORIF) MANDIBULAR FRACTURE, Ear Laceration Repair;  Surgeon: Newman Pies, MD;  Location: [facility] OR;  Service: ENT;  Laterality: Right;     reports that he has been smoking cigarettes. He has a 10 pack-year smoking history. He has never used smokeless tobacco. He reports current alcohol use. He reports that he does not use drugs.  No Known Allergies  History reviewed. No pertinent family history.   Prior to Admission medications   Medication Sig Start Date End Date Taking? Authorizing Provider  amLODipine (NORVASC) 10 MG tablet Take 1 tablet (10 mg total) by mouth daily. 10/22/22   Shelby Mattocks, DO  amoxicillin (AMOXIL) 500 MG capsule Take 1 capsule (500 mg total) by mouth every 6 (six) hours. 10/14/22     amoxicillin (AMOXIL) 500 MG capsule Take 1 capsule (500 mg total) by mouth 3 (three) times daily. 01/28/23     ibuprofen (ADVIL) 800 MG tablet Take 1 tablet (800  mg total) by mouth every 8 (eight) hours with food or milk. 10/15/22     losartan (COZAAR) 50 MG tablet Take 1 tablet (50 mg total) by mouth daily. 10/22/22   Shelby Mattocks, DO  polyethylene glycol (MIRALAX / GLYCOLAX) 17 g packet Take 17 g by mouth daily as needed for mild constipation. 08/12/22   Merlene Laughter, DO    Physical Exam: Vitals:   04/06/23 1408 04/06/23 1413 04/06/23 1416  BP:   (!) 161/86  Pulse:  (!) 112   Resp:  18    Temp:  (!) 102.4 F (39.1 C)   TempSrc:  Oral   SpO2:  95%   Weight: (!) 226.8 kg    Height: 6' (1.829 m)      Constitutional: Weak, extremely obese Vitals:   04/06/23 1408 04/06/23 1413 04/06/23 1416  BP:   (!) 161/86  Pulse:  (!) 112   Resp:  18   Temp:  (!) 102.4 F (39.1 C)   TempSrc:  Oral   SpO2:  95%   Weight: (!) 226.8 kg    Height: 6' (1.829 m)     Eyes: PERRL, lids and conjunctivae normal ENMT: Mucous membranes are dry  Neck: normal, supple, no masses, no thyromegaly Respiratory: Mild expiratory wheezing.  No crackles. Normal respiratory effort. No accessory muscle use.  Cardiovascular: Sinus tach, no murmurs / rubs / gallops. No extremity edema.  Abdomen: no tenderness, no masses palpated. No hepatosplenomegaly. Bowel sounds positive.  Musculoskeletal: no clubbing / cyanosis. No joint deformity upper and lower extremities.  Severe bilateral lower extremity lymphedema.  Erythematous changes on the right lower extremity.  Shallow ulcers on bilateral lower extremities on the ventral surface Skin: no rashes, lesions,  No induration Neurologic: CN 2-12 grossly intact.  Strength 5/5 in all 4.  Psychiatric: Normal judgment and insight. Alert and oriented x 3. Normal mood.   Foley Catheter:None  Labs on Admission: I have personally reviewed following labs and imaging studies  CBC: Recent Labs  Lab 04/06/23 1543  WBC 16.0*  NEUTROABS 14.0*  HGB 11.1*  HCT 36.1*  MCV 80.8  PLT 195   Basic Metabolic Panel: Recent Labs  Lab 04/06/23 1543  NA 136  K 2.9*  CL 98  CO2 29  GLUCOSE 105*  BUN 16  CREATININE 1.28*  CALCIUM 8.5*   GFR: Estimated Creatinine Clearance (by C-G formula based on SCr of 1.28 mg/dL (H)) Male: 161.0 mL/min (A) Male: 157.9 mL/min (A) Liver Function Tests: Recent Labs  Lab 04/06/23 1543  AST 23  ALT 17  ALKPHOS 58  BILITOT 0.7  PROT 7.6  ALBUMIN 3.3*   No results for input(s): "LIPASE", "AMYLASE" in the last 168 hours. No  results for input(s): "AMMONIA" in the last 168 hours. Coagulation Profile: No results for input(s): "INR", "PROTIME" in the last 168 hours. Cardiac Enzymes: No results for input(s): "CKTOTAL", "CKMB", "CKMBINDEX", "TROPONINI" in the last 168 hours. BNP (last 3 results) No results for input(s): "PROBNP" in the last 8760 hours. HbA1C: No results for input(s): "HGBA1C" in the last 72 hours. CBG: No results for input(s): "GLUCAP" in the last 168 hours. Lipid Profile: No results for input(s): "CHOL", "HDL", "LDLCALC", "TRIG", "CHOLHDL", "LDLDIRECT" in the last 72 hours. Thyroid Function Tests: No results for input(s): "TSH", "T4TOTAL", "FREET4", "T3FREE", "THYROIDAB" in the last 72 hours. Anemia Panel: No results for input(s): "VITAMINB12", "FOLATE", "FERRITIN", "TIBC", "IRON", "RETICCTPCT" in the last 72 hours. Urine analysis:    Component Value Date/Time  COLORURINE YELLOW 02/06/2021 1752   APPEARANCEUR HAZY (A) 02/06/2021 1752   LABSPEC 1.023 02/06/2021 1752   PHURINE 6.0 02/06/2021 1752   GLUCOSEU NEGATIVE 02/06/2021 1752   HGBUR NEGATIVE 02/06/2021 1752   BILIRUBINUR NEGATIVE 02/06/2021 1752   KETONESUR NEGATIVE 02/06/2021 1752   PROTEINUR 100 (A) 02/06/2021 1752   UROBILINOGEN 1.0 10/15/2008 1535   NITRITE NEGATIVE 02/06/2021 1752   LEUKOCYTESUR MODERATE (A) 02/06/2021 1752    Radiological Exams on Admission: DG Chest Portable 1 View Result Date: 04/06/2023 CLINICAL DATA:  Cough. EXAM: PORTABLE CHEST 1 VIEW COMPARISON:  September 16, 2021. FINDINGS: Stable cardiomegaly. Both lungs are clear. The visualized skeletal structures are unremarkable. IMPRESSION: No active disease. Electronically Signed   By: Lupita Raider M.D.   On: 04/06/2023 15:42     Assessment/Plan Principal Problem:   Sepsis due to cellulitis [facility]) Active Problems:   Hypokalemia   Lymphedema   Influenza A   Leukocytosis   AKI (acute kidney injury) (HCC)   Essential hypertension   Sepsis secondary to  cellulitis versus flu: Presented with high-grade fever, tachycardia, leukocytosis.  Likely from flu but could also be from cellulitis of lower extremity.  Continue current antibiotics.  Follow-up cultures.  Continue current antibiotics, gentle IV fluids.    Mild leukocytosis, continue to monitor CBC  Cellulitis of lower extremity: Has chronic bilateral lymphedema.  Lower extremity have appeared more erythematous mainly on the right, tender.  Started on vancomycin and ceftriaxone. As per previous notes, he had chronic nonhealing wounds on the legs but as per the sister, he has developed new ulcers on the backside of his legs.  Wound care consulted.  He was previously following with orthopedics, wound care center.  Influenza A: Started on Tamiflu.  His symptoms actually started early this morning.  Has some wheezing.  Has sick contacts at home.  Currently on room air.  X-ray did not show pneumonia.  Continue bronchodilators  AKI: On reviewing  his previous labs, his creatinine has ranged from 1.1-1.3.  Also reported some loose stools, nausea at home.  Continue gentle IV fluids for now.  Monitor BMP.  Hypokalemia: Current being supplemented.  Will check magnesium level  Hypertension: Takes amlodipine, losartan at home.  Continue to hold losartan for now.  Continue amlodipine.  Monitor blood pressure.  Super morbid obesity: BMI of 67.8.  Ambulatory at baseline.        Severity of Illness: The appropriate patient status for this patient is INPATIENT  DVT prophylaxis: Lovenox Code Status: Full code Family Communication: Sister at bedside Consults called: None     Burnadette Pop MD [facility]  04/06/2023, 5:30 PM

## 2023-04-07 DIAGNOSIS — A419 Sepsis, unspecified organism: Secondary | ICD-10-CM | POA: Diagnosis not present

## 2023-04-07 DIAGNOSIS — L0390 Cellulitis, unspecified: Secondary | ICD-10-CM | POA: Diagnosis not present

## 2023-04-07 LAB — RESPIRATORY PANEL BY PCR
Adenovirus: NOT DETECTED
Bordetella Parapertussis: NOT DETECTED
Bordetella pertussis: NOT DETECTED
Chlamydophila pneumoniae: NOT DETECTED
Coronavirus 229E: NOT DETECTED
Coronavirus HKU1: NOT DETECTED
Coronavirus NL63: NOT DETECTED
Coronavirus OC43: NOT DETECTED
Influenza A: UNDETERMINED — AB
Influenza B: NOT DETECTED
Metapneumovirus: NOT DETECTED
Mycoplasma pneumoniae: NOT DETECTED
Parainfluenza Virus 1: NOT DETECTED
Parainfluenza Virus 2: NOT DETECTED
Parainfluenza Virus 3: NOT DETECTED
Parainfluenza Virus 4: NOT DETECTED
Respiratory Syncytial Virus: NOT DETECTED
Rhinovirus / Enterovirus: NOT DETECTED

## 2023-04-07 LAB — BASIC METABOLIC PANEL
Anion gap: 9 (ref 5–15)
BUN: 15 mg/dL (ref 6–20)
CO2: 28 mmol/L (ref 22–32)
Calcium: 7.9 mg/dL — ABNORMAL LOW (ref 8.9–10.3)
Chloride: 98 mmol/L (ref 98–111)
Creatinine, Ser: 1.48 mg/dL — ABNORMAL HIGH (ref 0.61–1.24)
GFR, Estimated: >60 mL/min (ref >60)
Glucose, Bld: 109 mg/dL — ABNORMAL HIGH (ref 70–99)
Potassium: 2.9 mmol/L — ABNORMAL LOW (ref 3.5–5.1)
Sodium: 135 mmol/L (ref 135–145)

## 2023-04-07 LAB — BLOOD GAS, ARTERIAL
Acid-Base Excess: 4.2 mmol/L — ABNORMAL HIGH (ref 0.0–2.0)
Bicarbonate: 31.1 mmol/L — ABNORMAL HIGH (ref 20.0–28.0)
Drawn by: 30136
O2 Saturation: 91.3 %
Patient temperature: 36.9
pCO2 arterial: 55 mm[Hg] — ABNORMAL HIGH (ref 32–48)
pH, Arterial: 7.36 (ref 7.35–7.45)
pO2, Arterial: 60 mm[Hg] — ABNORMAL LOW (ref 83–108)

## 2023-04-07 LAB — BLOOD CULTURE ID PANEL (REFLEXED) - BCID2

## 2023-04-07 LAB — CBC
HCT: 35.7 % — ABNORMAL LOW (ref 39.0–52.0)
Hemoglobin: 11 g/dL — ABNORMAL LOW (ref 13.0–17.0)
MCH: 24.9 pg — ABNORMAL LOW (ref 26.0–34.0)
MCHC: 30.8 g/dL (ref 30.0–36.0)
MCV: 80.8 fL (ref 80.0–100.0)
Platelets: 178 10*3/uL (ref 150–400)
RBC: 4.42 MIL/uL (ref 4.22–5.81)
RDW: 17.5 % — ABNORMAL HIGH (ref 11.5–15.5)
WBC: 17 10*3/uL — ABNORMAL HIGH (ref 4.0–10.5)
nRBC: 0 % (ref 0.0–0.2)

## 2023-04-07 LAB — MAGNESIUM: Magnesium: 1.4 mg/dL — ABNORMAL LOW (ref 1.7–2.4)

## 2023-04-07 LAB — MRSA NEXT GEN BY PCR, NASAL: MRSA by PCR Next Gen: NOT DETECTED

## 2023-04-07 MED ORDER — MAGNESIUM SULFATE 2 GM/50ML IV SOLN
2.0000 g | Freq: Once | INTRAVENOUS | Status: AC
  Administered 2023-04-07: 2 g via INTRAVENOUS
  Filled 2023-04-07: qty 50

## 2023-04-07 MED ORDER — POTASSIUM CHLORIDE CRYS ER 20 MEQ PO TBCR
40.0000 meq | EXTENDED_RELEASE_TABLET | ORAL | Status: AC
  Administered 2023-04-07 (×2): 40 meq via ORAL
  Filled 2023-04-07 (×2): qty 2

## 2023-04-07 MED ORDER — HYDRALAZINE HCL 20 MG/ML IJ SOLN
10.0000 mg | Freq: Once | INTRAMUSCULAR | Status: AC
  Administered 2023-04-07: 10 mg via INTRAVENOUS
  Filled 2023-04-07: qty 1

## 2023-04-07 MED ORDER — PREDNISONE 20 MG PO TABS
40.0000 mg | ORAL_TABLET | Freq: Every day | ORAL | Status: DC
  Administered 2023-04-08: 40 mg via ORAL
  Filled 2023-04-07: qty 2

## 2023-04-07 MED ORDER — CARVEDILOL 6.25 MG PO TABS
6.2500 mg | ORAL_TABLET | Freq: Two times a day (BID) | ORAL | Status: DC
Start: 2023-04-07 — End: 2023-04-07
  Administered 2023-04-07: 6.25 mg via ORAL
  Filled 2023-04-07: qty 1

## 2023-04-07 MED ORDER — HYDRALAZINE HCL 50 MG PO TABS
50.0000 mg | ORAL_TABLET | Freq: Three times a day (TID) | ORAL | Status: DC
  Administered 2023-04-07 – 2023-04-08 (×3): 50 mg via ORAL
  Filled 2023-04-07 (×3): qty 1

## 2023-04-07 MED ORDER — METHYLPREDNISOLONE SODIUM SUCC 125 MG IJ SOLR
60.0000 mg | Freq: Two times a day (BID) | INTRAMUSCULAR | Status: DC
  Administered 2023-04-07: 60 mg via INTRAVENOUS
  Filled 2023-04-07: qty 2

## 2023-04-07 MED ORDER — CARVEDILOL 12.5 MG PO TABS
12.5000 mg | ORAL_TABLET | Freq: Two times a day (BID) | ORAL | Status: DC
  Administered 2023-04-07: 12.5 mg via ORAL
  Filled 2023-04-07: qty 1

## 2023-04-07 MED ORDER — ENOXAPARIN SODIUM 100 MG/ML IJ SOSY
100.0000 mg | PREFILLED_SYRINGE | INTRAMUSCULAR | Status: DC
  Administered 2023-04-07: 100 mg via SUBCUTANEOUS
  Filled 2023-04-07: qty 1

## 2023-04-07 MED ORDER — IBUPROFEN 200 MG PO TABS
200.0000 mg | ORAL_TABLET | Freq: Once | ORAL | Status: AC
  Administered 2023-04-07: 200 mg via ORAL
  Filled 2023-04-07: qty 1

## 2023-04-07 MED ORDER — ORAL CARE MOUTH RINSE: 15.0000 mL | OROMUCOSAL | Status: DC | PRN

## 2023-04-07 MED ORDER — LABETALOL HCL 5 MG/ML IV SOLN
10.0000 mg | INTRAVENOUS | Status: DC | PRN
  Administered 2023-04-07: 10 mg via INTRAVENOUS
  Filled 2023-04-07: qty 4

## 2023-04-07 MED ORDER — IPRATROPIUM-ALBUTEROL 0.5-2.5 (3) MG/3ML IN SOLN
3.0000 mL | Freq: Two times a day (BID) | RESPIRATORY_TRACT | Status: DC
  Administered 2023-04-07 – 2023-04-08 (×2): 3 mL via RESPIRATORY_TRACT
  Filled 2023-04-07 (×2): qty 3

## 2023-04-07 NOTE — Consult Note (Addendum)
 WOC Nurse Consult Note: Reason for Consult: Consult requested for bilat legs.  Performed remotely after review of progress notes and photos in the EMR.   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  Wounds are red and moist with mod amt yellow drainage, Left is 6X7, according to bedside nurses' wound care flow sheet Right is 6X7, according to bedside nurses' wound care flow sheet Dressing procedure/placement/frequency: Topical treatment orders provided for bedside nurses to perform as follows to absorb drainage and provide antimicrobial benefits and provide light compression:  Bedside nurse change bilat leg dressings Q day as follows: Apply Aquacel [place] # [phone]) to wounds, then cover with abd pads and kerlex, beginning just behind toes to below knees in a spiral fashion, then ace wrap in the same manner.  Moisten previously dressings with NS to assist with removal each time.  Please re-consult if further assistance is needed.  Thank-you,  Cammie Mcgee MSN, RN, CWOCN, [place], CNS [phone]

## 2023-04-07 NOTE — Progress Notes (Signed)
 Patient intermittently very difficult to wake up, poor attention/concentration, however, then will be awake and feed himself and move dangle on edge of bed, then return to sleeping afterward. RN educated and requested patient to call when needing assistance.

## 2023-04-07 NOTE — Progress Notes (Signed)
   04/07/23 2249  BiPAP/CPAP/SIPAP  $ Face Mask XL Yes  BiPAP/CPAP/SIPAP Pt Type Adult  BiPAP/CPAP/SIPAP Resmed  Mask Type Full face mask  Dentures removed? Not applicable  Mask Size Extra large  IPAP 15 cmH20  EPAP 5 cmH2O  Flow Rate 4 lpm  Patient Home Equipment No  Auto Titrate No

## 2023-04-07 NOTE — Plan of Care (Signed)

## 2023-04-07 NOTE — Progress Notes (Signed)
 PHARMACY - PHYSICIAN COMMUNICATION CRITICAL VALUE ALERT - BLOOD CULTURE IDENTIFICATION (BCID)  Jared Mccoy is an 33 y.o. adult who presented to [facility] on 04/06/2023 with a chief complaint of Flu and LE cellulitis  Assessment:  2/2 BCx (2/4 bottles) growing strep spp (suspect SSTI source)  Name of physician (or Provider) Contacted: Adhikari  Current antibiotics: Vancomycin, Rocephin, Tamiflu  Changes to prescribed antibiotics recommended: Stop vanc, continue Rocephin & Tamiflu Recommendations accepted by provider  Results for orders placed or performed during the hospital encounter of 04/06/23  Blood Culture ID Panel (Reflexed) (Collected: 04/06/2023  6:05 PM)  Result Value Ref Range   Enterococcus faecalis NOT DETECTED NOT DETECTED   Enterococcus Faecium NOT DETECTED NOT DETECTED   Listeria monocytogenes NOT DETECTED NOT DETECTED   Staphylococcus species NOT DETECTED NOT DETECTED   Staphylococcus aureus (BCID) NOT DETECTED NOT DETECTED   Staphylococcus epidermidis NOT DETECTED NOT DETECTED   Staphylococcus lugdunensis NOT DETECTED NOT DETECTED   Streptococcus species DETECTED (A) NOT DETECTED   Streptococcus agalactiae NOT DETECTED NOT DETECTED   Streptococcus pneumoniae NOT DETECTED NOT DETECTED   Streptococcus pyogenes NOT DETECTED NOT DETECTED   A.calcoaceticus-baumannii NOT DETECTED NOT DETECTED   Bacteroides fragilis NOT DETECTED NOT DETECTED   Enterobacterales NOT DETECTED NOT DETECTED   Enterobacter cloacae complex NOT DETECTED NOT DETECTED   Escherichia coli NOT DETECTED NOT DETECTED   Klebsiella aerogenes NOT DETECTED NOT DETECTED   Klebsiella oxytoca NOT DETECTED NOT DETECTED   Klebsiella pneumoniae NOT DETECTED NOT DETECTED   Proteus species NOT DETECTED NOT DETECTED   Salmonella species NOT DETECTED NOT DETECTED   Serratia marcescens NOT DETECTED NOT DETECTED   Haemophilus influenzae NOT DETECTED NOT DETECTED   Neisseria meningitidis NOT DETECTED NOT  DETECTED   Pseudomonas aeruginosa NOT DETECTED NOT DETECTED   Stenotrophomonas maltophilia NOT DETECTED NOT DETECTED   Candida albicans NOT DETECTED NOT DETECTED   Candida auris NOT DETECTED NOT DETECTED   Candida glabrata NOT DETECTED NOT DETECTED   Candida krusei NOT DETECTED NOT DETECTED   Candida parapsilosis NOT DETECTED NOT DETECTED   Candida tropicalis NOT DETECTED NOT DETECTED   Cryptococcus neoformans/gattii NOT DETECTED NOT DETECTED    Demitra Danley A 04/07/2023  3:28 PM

## 2023-04-07 NOTE — Progress Notes (Signed)
   04/07/23 0224  Vitals  Temp (!) 102.1 F (38.9 C)  Temp Source Oral  BP (!) 191/94  MAP (mmHg) 121  BP Location Right Wrist  BP Method Automatic  Patient Position (if appropriate) Lying  Pulse Rate (!) 103  Pulse Rate Source Monitor  Resp (!) 26  MEWS COLOR  MEWS Score Color Red  Oxygen Therapy  SpO2 100 %  O2 Device Nasal Cannula  O2 Flow Rate (L/min) 2 L/min  MEWS Score  MEWS Temp 2  MEWS Systolic 0  MEWS Pulse 1  MEWS RR 2  MEWS LOC 0  MEWS Score 5  Provider Notification  Provider Name/Title Chinita Greenland  Date Provider Notified 04/07/23  Time Provider Notified 0227  Method of Notification  (secure chat)  Notification Reason Other (Comment) (red MEWS)  Provider response Other (Comment) (try to low temp)

## 2023-04-07 NOTE — Progress Notes (Signed)
   04/07/23 0156  Assess: MEWS Score  Temp 98.8 F (37.1 C)  BP (!) 212/115  MAP (mmHg) 143  Pulse Rate (!) 107  Resp (!) 21  SpO2 94 %  Assess: MEWS Score  MEWS Temp 0  MEWS Systolic 2  MEWS Pulse 1  MEWS RR 1  MEWS LOC 0  MEWS Score 4  MEWS Score Color Red  Assess: if the MEWS score is Yellow or Red  Were vital signs accurate and taken at a resting state? No, vital signs rechecked  Does the patient meet 2 or more of the SIRS criteria? Yes  Does the patient have a confirmed or suspected source of infection? Yes  MEWS guidelines implemented  No, previously red, continue vital signs every 4 hours  Notify: Charge Nurse/RN  Name of Charge Nurse/RN Notified Malen Gauze, RN  Provider Notification  Provider Name/Title Chinita Greenland, NP  Date Provider Notified 04/07/23  Time Provider Notified 0207  Method of Notification  (secure chat)  Notification Reason Change in status (red MEWS)  Provider response Other (Comment)  Date of Provider Response 04/07/23  Time of Provider Response 0215  Assess: SIRS CRITERIA  SIRS Temperature  0  SIRS Respirations  1  SIRS Pulse 1  SIRS WBC 0  SIRS Score Sum  2

## 2023-04-07 NOTE — Progress Notes (Addendum)
 PROGRESS NOTE  Jared Mccoy  ZOX:[id number] DOB: 07-25-1990 DOA: 04/06/2023 PCP: Pete Pelt, PA-C   Brief Narrative: Jared Mccoy is a 33 y.o.male with medical history significant of super morbid obesity, bilateral lower extremity edema, hypertension who presented to the emergency department with complaints of flulike symptoms fever, body aches, worsening of bilateral lower leg wounds.  Influenza A came  to be positive.  Started on Tamiflu and also brought for antibiotics for possible cellulitis of bilateral lower extremities.  Assessment & Plan:  Principal Problem:   Sepsis due to cellulitis [facility]) Active Problems:   Hypokalemia   Lymphedema   Influenza A   Leukocytosis   AKI (acute kidney injury) (HCC)   Essential hypertension   Sepsis secondary to cellulitis versus flu: Presented with high-grade fever, tachycardia, leukocytosis.  Likely from flu but could also be from cellulitis of lower extremity.  Continue current antibiotics.  Follow-up cultures.  Still had mild grade fever this morning.  Has leukocytosis.  Acute hypoxic respiratory failure/OSA/OHS: Currently requiring 2 to 3 weeks of oxygen per night.  ABG done this morning showed normal pH with pCO2 of 55, bicarb of 31.  He has chronic underlying OSA/possible OHS which could be contributing to elevated pCO2.  Currently compensated.  Continue BiPAP with sleep.  Uses CPAP at home while asleep   Cellulitis of lower extremity: Has chronic bilateral lymphedema.  Lower extremity have appeared more erythematous mainly on the right, tender.  Started on vancomycin and ceftriaxone. As per previous notes, he had chronic nonhealing wounds on the legs but as per the sister, he has developed new ulcers on the backside of his legs.  Wound care consulted.  He was previously following with orthopedics, wound care center.  Legs wrapped this morning   Influenza A: Started on Tamiflu.  Has some wheezing.    X-ray did not show pneumonia.   Continue bronchodilators, steroids, wean the oxygen   AKI: On reviewing  his previous labs, his creatinine has ranged from 1.1-1.3.  Also reported some loose stools, nausea at home.  IV fluids stopped  Hypokalemia/hypomagnesemia: Current being supplemented and monitored  Hypertension: Takes amlodipine, losartan at home.  Continue to hold losartan for now.  Continue amlodipine. Added coreg.  Will continue to titrate medications   Super morbid obesity: BMI of 67.8.  Ambulatory at baseline.  Deconditioning/weakness: Will consult PT.          DVT prophylaxis:Lovenox     Code Status: Full Code  Family Communication: Sister at bedside on 2/23  Patient status: Inpatient  Patient is from : Home  Anticipated discharge to: Home  Estimated DC date: 2 to 3 days   Consultants: None  Procedures: None  Antimicrobials:  Anti-infectives (From admission, onward)    Start     Dose/Rate Route Frequency Ordered Stop   04/07/23 0645  vancomycin (VANCOREADY) IVPB 2000 mg/400 mL       Placed in "Followed by" Linked Group   2,000 mg 200 mL/hr over 120 Minutes Intravenous Every 12 hours 04/06/23 1819     04/06/23 2000  oseltamivir (TAMIFLU) capsule 75 mg        75 mg Oral 2 times daily 04/06/23 1735 04/11/23 1959   04/06/23 1845  vancomycin (VANCOREADY) IVPB 2000 mg/400 mL       Placed in "Followed by" Linked Group   2,000 mg 200 mL/hr over 120 Minutes Intravenous  Once 04/06/23 1819 04/07/23 0737   04/06/23 1800  cefTRIAXone (ROCEPHIN) 2 g  in sodium chloride 0.9 % 100 mL IVPB        2 g 200 mL/hr over 30 Minutes Intravenous Every 24 hours 04/06/23 1723         Subjective: Patient  seen and examined at bedside today.  Hemodynamically stable.  On 2 to 3 L of oxygen per minute.  Looks better than yesterday.  He says he feels better this morning.  Eating his breakfast.  Still had mild fever this morning.    Objective: Vitals:   04/07/23 0552 04/07/23 0817 04/07/23 0941 04/07/23 0958   BP:  (!) 176/95  (!) 176/95  Pulse:  95    Resp:  (!) 23    Temp:  98.4 F (36.9 C)    TempSrc:  Oral    SpO2: 95% 96% (!) 87%   Weight:      Height:        Intake/Output Summary (Last 24 hours) at 04/07/2023 1059 Last data filed at 04/07/2023 1005 Gross per 24 hour  Intake 2324.63 ml  Output 825 ml  Net 1499.63 ml   Filed Weights   04/06/23 1408  Weight: (!) 226.8 kg    Examination:  General exam: Overall comfortable, not in distress, morbidly obese HEENT: PERRL Respiratory system: Diminished sounds  bilaterally, no wheezes or crackles  Cardiovascular system: S1 & S2 heard, RRR.  Gastrointestinal system: Abdomen is nondistended, soft and nontender. Central nervous system: Alert and oriented Extremities: Bilateral chronic lymphedema, shallow ulcers, legs wrapped Skin: Shallow ulcers on bilateral lower extremities  Data Reviewed: I have personally reviewed following labs and imaging studies  CBC: Recent Labs  Lab 04/06/23 1543 04/07/23 0332  WBC 16.0* 17.0*  NEUTROABS 14.0*  --   HGB 11.1* 11.0*  HCT 36.1* 35.7*  MCV 80.8 80.8  PLT 195 178   Basic Metabolic Panel: Recent Labs  Lab 04/06/23 1543 04/06/23 2157 04/07/23 0332  NA 136  --  135  K 2.9*  --  2.9*  CL 98  --  98  CO2 29  --  28  GLUCOSE 105*  --  109*  BUN 16  --  15  CREATININE 1.28*  --  1.48*  CALCIUM 8.5*  --  7.9*  MG  --  1.4* 1.4*     Recent Results (from the past 240 hours)  Resp panel by RT-PCR (RSV, Flu A&B, Covid) Anterior Nasal Swab     Status: Abnormal   Collection Time: 04/06/23  2:25 PM   Specimen: Anterior Nasal Swab  Result Value Ref Range Status   SARS Coronavirus 2 by RT PCR NEGATIVE NEGATIVE Final    Comment: (NOTE) SARS-CoV-2 target nucleic acids are NOT DETECTED.  The SARS-CoV-2 RNA is generally detectable in upper respiratory specimens during the acute phase of infection. The lowest concentration of SARS-CoV-2 viral copies this assay can detect is 138  copies/mL. A negative result does not preclude SARS-Cov-2 infection and should not be used as the sole basis for treatment or other patient management decisions. A negative result may occur with  improper specimen collection/handling, submission of specimen other than nasopharyngeal swab, presence of viral mutation(s) within the areas targeted by this assay, and inadequate number of viral copies(<138 copies/mL). A negative result must be combined with clinical observations, patient history, and epidemiological information. The expected result is Negative.  Fact Sheet for Patients:  BloggerCourse.com  Fact Sheet for Healthcare Providers:  SeriousBroker.it  This test is no t yet approved or cleared by the [place]  FDA and  has been authorized for detection and/or diagnosis of SARS-CoV-2 by FDA under an Emergency Use Authorization (EUA). This EUA will remain  in effect (meaning this test can be used) for the duration of the COVID-19 declaration under Section 564(b)(1) of the Act, 21 U.S.C.section 360bbb-3(b)(1), unless the authorization is terminated  or revoked sooner.       Influenza A by PCR POSITIVE (A) NEGATIVE Final   Influenza B by PCR NEGATIVE NEGATIVE Final    Comment: (NOTE) The Xpert Xpress SARS-CoV-2/FLU/RSV plus assay is intended as an aid in the diagnosis of influenza from Nasopharyngeal swab specimens and should not be used as a sole basis for treatment. Nasal washings and aspirates are unacceptable for Xpert Xpress SARS-CoV-2/FLU/RSV testing.  Fact Sheet for Patients: BloggerCourse.com  Fact Sheet for Healthcare Providers: SeriousBroker.it  This test is not yet approved or cleared by the [place] FDA and has been authorized for detection and/or diagnosis of SARS-CoV-2 by FDA under an Emergency Use Authorization (EUA). This EUA will remain in effect  (meaning this test can be used) for the duration of the COVID-19 declaration under Section 564(b)(1) of the Act, 21 U.S.C. section 360bbb-3(b)(1), unless the authorization is terminated or revoked.     Resp Syncytial Virus by PCR NEGATIVE NEGATIVE Final    Comment: (NOTE) Fact Sheet for Patients: BloggerCourse.com  Fact Sheet for Healthcare Providers: SeriousBroker.it  This test is not yet approved or cleared by the [place] FDA and has been authorized for detection and/or diagnosis of SARS-CoV-2 by FDA under an Emergency Use Authorization (EUA). This EUA will remain in effect (meaning this test can be used) for the duration of the COVID-19 declaration under Section 564(b)(1) of the Act, 21 U.S.C. section 360bbb-3(b)(1), unless the authorization is terminated or revoked.  Performed at [facility], [address], [place], [place] [address]      Radiology Studies: DG Chest Portable 1 View Result Date: 04/06/2023 CLINICAL DATA:  Cough. EXAM: PORTABLE CHEST 1 VIEW COMPARISON:  September 16, 2021. FINDINGS: Stable cardiomegaly. Both lungs are clear. The visualized skeletal structures are unremarkable. IMPRESSION: No active disease. Electronically Signed   By: Lupita Raider M.D.   On: 04/06/2023 15:42    Scheduled Meds:  amLODipine  10 mg Oral Daily   carvedilol  6.25 mg Oral BID WC   enoxaparin (LOVENOX) injection  105 mg Subcutaneous Q24H   ipratropium-albuterol  3 mL Nebulization Q6H   methylPREDNISolone (SOLU-MEDROL) injection  60 mg Intravenous Q12H   oseltamivir  75 mg Oral BID   Continuous Infusions:  cefTRIAXone (ROCEPHIN)  IV Stopped (04/06/23 1911)   vancomycin Stopped (04/07/23 0832)     LOS: 1 day   Burnadette Pop, MD [facility] P2/24/2025, 10:59 AM

## 2023-04-08 ENCOUNTER — Other Ambulatory Visit (HOSPITAL_COMMUNITY): Payer: Self-pay

## 2023-04-08 ENCOUNTER — Encounter: Payer: Self-pay | Admitting: Infectious Diseases

## 2023-04-08 DIAGNOSIS — L0390 Cellulitis, unspecified: Secondary | ICD-10-CM | POA: Diagnosis not present

## 2023-04-08 DIAGNOSIS — A419 Sepsis, unspecified organism: Secondary | ICD-10-CM | POA: Diagnosis not present

## 2023-04-08 LAB — BASIC METABOLIC PANEL
Anion gap: 10 (ref 5–15)
BUN: 19 mg/dL (ref 6–20)
CO2: 27 mmol/L (ref 22–32)
Calcium: 8.6 mg/dL — ABNORMAL LOW (ref 8.9–10.3)
Chloride: 99 mmol/L (ref 98–111)
Creatinine, Ser: 1.3 mg/dL — ABNORMAL HIGH (ref 0.61–1.24)
GFR, Estimated: >60 mL/min (ref >60)
Glucose, Bld: 127 mg/dL — ABNORMAL HIGH (ref 70–99)
Potassium: 3.5 mmol/L (ref 3.5–5.1)
Sodium: 136 mmol/L (ref 135–145)

## 2023-04-08 LAB — CBC
HCT: 35 % — ABNORMAL LOW (ref 39.0–52.0)
Hemoglobin: 10.6 g/dL — ABNORMAL LOW (ref 13.0–17.0)
MCH: 24.7 pg — ABNORMAL LOW (ref 26.0–34.0)
MCHC: 30.3 g/dL (ref 30.0–36.0)
MCV: 81.6 fL (ref 80.0–100.0)
Platelets: 162 10*3/uL (ref 150–400)
RBC: 4.29 MIL/uL (ref 4.22–5.81)
RDW: 17.4 % — ABNORMAL HIGH (ref 11.5–15.5)
WBC: 16 10*3/uL — ABNORMAL HIGH (ref 4.0–10.5)
nRBC: 0 % (ref 0.0–0.2)

## 2023-04-08 LAB — MAGNESIUM: Magnesium: 2.1 mg/dL (ref 1.7–2.4)

## 2023-04-08 MED ORDER — CARVEDILOL 25 MG PO TABS
25.0000 mg | ORAL_TABLET | Freq: Two times a day (BID) | ORAL | Status: DC
  Administered 2023-04-08: 25 mg via ORAL
  Filled 2023-04-08: qty 1

## 2023-04-08 MED ORDER — CARVEDILOL 25 MG PO TABS
25.0000 mg | ORAL_TABLET | Freq: Two times a day (BID) | ORAL | 0 refills | Status: DC
  Filled 2023-04-08: qty 60, 30d supply, fill #0

## 2023-04-08 MED ORDER — AMOXICILLIN-POT CLAVULANATE 875-125 MG PO TABS: 1.0000 | ORAL_TABLET | Freq: Two times a day (BID) | ORAL | Status: DC

## 2023-04-08 MED ORDER — DOXYCYCLINE HYCLATE 100 MG PO TABS
100.0000 mg | ORAL_TABLET | Freq: Two times a day (BID) | ORAL | 0 refills | Status: DC
  Filled 2023-04-08: qty 10, 5d supply, fill #0

## 2023-04-08 MED ORDER — HYDRALAZINE HCL 50 MG PO TABS
50.0000 mg | ORAL_TABLET | Freq: Three times a day (TID) | ORAL | 0 refills | Status: DC
  Filled 2023-04-08: qty 90, 30d supply, fill #0
  Filled 2023-04-08: qty 60, 20d supply, fill #0

## 2023-04-08 MED ORDER — POTASSIUM CHLORIDE CRYS ER 20 MEQ PO TBCR
40.0000 meq | EXTENDED_RELEASE_TABLET | Freq: Once | ORAL | Status: AC
  Administered 2023-04-08: 40 meq via ORAL
  Filled 2023-04-08: qty 2

## 2023-04-08 MED ORDER — AMLODIPINE BESYLATE 10 MG PO TABS
10.0000 mg | ORAL_TABLET | Freq: Every day | ORAL | 0 refills | Status: DC
  Filled 2023-04-08: qty 30, 30d supply, fill #0

## 2023-04-08 MED ORDER — AMOXICILLIN-POT CLAVULANATE 875-125 MG PO TABS
1.0000 | ORAL_TABLET | Freq: Two times a day (BID) | ORAL | 0 refills | Status: DC
  Filled 2023-04-08: qty 10, 5d supply, fill #0

## 2023-04-08 MED ORDER — LINEZOLID 600 MG PO TABS
600.0000 mg | ORAL_TABLET | Freq: Two times a day (BID) | ORAL | 0 refills | Status: AC
  Filled 2023-04-08: qty 20, 10d supply, fill #0

## 2023-04-08 MED ORDER — PREDNISONE 20 MG PO TABS
40.0000 mg | ORAL_TABLET | Freq: Every day | ORAL | 0 refills | Status: AC
Start: 2023-04-09 — End: 2023-04-12
  Filled 2023-04-08: qty 6, 3d supply, fill #0

## 2023-04-08 MED ORDER — DOXYCYCLINE HYCLATE 100 MG PO TABS: 100.0000 mg | ORAL_TABLET | Freq: Two times a day (BID) | ORAL | Status: DC

## 2023-04-08 MED ORDER — OSELTAMIVIR PHOSPHATE 75 MG PO CAPS
75.0000 mg | ORAL_CAPSULE | Freq: Two times a day (BID) | ORAL | 0 refills | Status: AC
  Filled 2023-04-08: qty 6, 3d supply, fill #0

## 2023-04-08 MED ORDER — SODIUM CHLORIDE 0.9 % IV SOLN: 2.0000 g | INTRAVENOUS | Status: DC

## 2023-04-08 NOTE — Progress Notes (Signed)
 Patient was ready to leave AMA, verbally abusive to staff and had signed AMA papers when I came to see him this morning

## 2023-04-08 NOTE — Progress Notes (Signed)
 Patient leaving AMA, Doctor Adhikari made aware, patient signed AMA form. Midline IV removed, patient left floor in stable condition.

## 2023-04-08 NOTE — Discharge Summary (Signed)
 Physician Discharge Summary  ARREN LAMINACK GMW:[id number] DOB: 02/17/1990 DOA: 04/06/2023  PCP: Pete Pelt, PA-C  Admit date: 04/06/2023 Discharge date: 04/08/2023  Admitted From: Home Disposition:  Signed AMA  Brief/Interim Summary: Jared Mccoy is a 33 y.o.male with medical history significant of super morbid obesity, bilateral lower extremity edema, hypertension who presented to the emergency department with complaints of flulike symptoms fever, body aches, worsening of bilateral lower leg wounds.  Influenza A came  to be positive.  Started on Tamiflu and also brought for antibiotics for possible cellulitis of bilateral lower extremities .  Patient was clinically improving. Blood cultures showed Streptococcus group G on both sides.  ID consulted.  Patient not willing to stay another day in the hospital.  Signed AMA.  Verbally abusive and belligerent.  Med sent to [facility]    Discharge Diagnoses:  Principal Problem:   Sepsis due to cellulitis [facility]) Active Problems:   Hypokalemia   Lymphedema   Influenza A   Leukocytosis   AKI (acute kidney injury) (HCC)   Essential hypertension    Discharge Instructions  Discharge Instructions     Diet - low sodium heart healthy   Complete by: As directed    Discharge instructions   Complete by: As directed    1)Take prescribed medications as instructed 2)Follow up with your PCP in a week.  Monitor blood pressure at home 3)Follow up with outpatient wound care   Discharge wound care:   Complete by: As directed    As per wound care nurse   Increase activity slowly   Complete by: As directed       Allergies as of 04/08/2023   No Known Allergies      Medication List     STOP taking these medications    amoxicillin 500 MG capsule Commonly known as: AMOXIL   ibuprofen 800 MG tablet Commonly known as: ADVIL   losartan 50 MG tablet Commonly known as: Cozaar       TAKE these medications    amLODipine  10 MG tablet Commonly known as: NORVASC Take 1 tablet (10 mg total) by mouth daily.   carvedilol 25 MG tablet Commonly known as: COREG Take 1 tablet (25 mg total) by mouth 2 (two) times daily with a meal.   hydrALAZINE 50 MG tablet Commonly known as: APRESOLINE Take 1 tablet (50 mg total) by mouth every 8 (eight) hours.   linezolid 600 MG tablet Commonly known as: ZYVOX Take 1 tablet (600 mg total) by mouth 2 (two) times daily for 10 days.   oseltamivir 75 MG capsule Commonly known as: TAMIFLU Take 1 capsule (75 mg total) by mouth 2 (two) times daily for 3 days.   polyethylene glycol 17 g packet Commonly known as: MIRALAX / GLYCOLAX Take 17 g by mouth daily as needed for mild constipation.   predniSONE 20 MG tablet Commonly known as: DELTASONE Take 2 tablets (40 mg total) by mouth daily with breakfast for 3 days. Start taking on: April 09, 2023               Discharge Care Instructions  (From admission, onward)           Start     Ordered   04/08/23 0000  Discharge wound care:       Comments: As per wound care nurse   04/08/23 1034            Follow-up Information     Pete Pelt,  PA-C. Schedule an appointment as soon as possible for a visit in 1 week(s).   Specialty: Physician Assistant Contact information: [address] [place] [address] [phone]                No Known Allergies  Consultations: ID   Procedures/Studies: DG Chest Portable 1 View Result Date: 04/06/2023 CLINICAL DATA:  Cough. EXAM: PORTABLE CHEST 1 VIEW COMPARISON:  September 16, 2021. FINDINGS: Stable cardiomegaly. Both lungs are clear. The visualized skeletal structures are unremarkable. IMPRESSION: No active disease. Electronically Signed   By: Lupita Raider M.D.   On: 04/06/2023 15:42         Discharge Exam: Vitals:   04/08/23 0813 04/08/23 0944  BP:  136/74  Pulse:    Resp:    Temp:    SpO2: 95%    Vitals:   04/08/23 0556  04/08/23 0749 04/08/23 0813 04/08/23 0944  BP:  135/85  136/74  Pulse: 96 89    Resp:      Temp:      TempSrc:      SpO2: 95% 96% 95%   Weight:      Height:          The results of significant diagnostics from this hospitalization (including imaging, microbiology, ancillary and laboratory) are listed below for reference.     Microbiology: Recent Results (from the past 240 hours)  Resp panel by RT-PCR (RSV, Flu A&B, Covid) Anterior Nasal Swab     Status: Abnormal   Collection Time: 04/06/23  2:25 PM   Specimen: Anterior Nasal Swab  Result Value Ref Range Status   SARS Coronavirus 2 by RT PCR NEGATIVE NEGATIVE Final    Comment: (NOTE) SARS-CoV-2 target nucleic acids are NOT DETECTED.  The SARS-CoV-2 RNA is generally detectable in upper respiratory specimens during the acute phase of infection. The lowest concentration of SARS-CoV-2 viral copies this assay can detect is 138 copies/mL. A negative result does not preclude SARS-Cov-2 infection and should not be used as the sole basis for treatment or other patient management decisions. A negative result may occur with  improper specimen collection/handling, submission of specimen other than nasopharyngeal swab, presence of viral mutation(s) within the areas targeted by this assay, and inadequate number of viral copies(<138 copies/mL). A negative result must be combined with clinical observations, patient history, and epidemiological information. The expected result is Negative.  Fact Sheet for Patients:  BloggerCourse.com  Fact Sheet for Healthcare Providers:  SeriousBroker.it  This test is no t yet approved or cleared by the [place] FDA and  has been authorized for detection and/or diagnosis of SARS-CoV-2 by FDA under an Emergency Use Authorization (EUA). This EUA will remain  in effect (meaning this test can be used) for the duration of the COVID-19 declaration under  Section 564(b)(1) of the Act, 21 U.S.C.section 360bbb-3(b)(1), unless the authorization is terminated  or revoked sooner.       Influenza A by PCR POSITIVE (A) NEGATIVE Final   Influenza B by PCR NEGATIVE NEGATIVE Final    Comment: (NOTE) The Xpert Xpress SARS-CoV-2/FLU/RSV plus assay is intended as an aid in the diagnosis of influenza from Nasopharyngeal swab specimens and should not be used as a sole basis for treatment. Nasal washings and aspirates are unacceptable for Xpert Xpress SARS-CoV-2/FLU/RSV testing.  Fact Sheet for Patients: BloggerCourse.com  Fact Sheet for Healthcare Providers: SeriousBroker.it  This test is not yet approved or cleared by the [place] and has been authorized  for detection and/or diagnosis of SARS-CoV-2 by FDA under an Emergency Use Authorization (EUA). This EUA will remain in effect (meaning this test can be used) for the duration of the COVID-19 declaration under Section 564(b)(1) of the Act, 21 U.S.C. section 360bbb-3(b)(1), unless the authorization is terminated or revoked.     Resp Syncytial Virus by PCR NEGATIVE NEGATIVE Final    Comment: (NOTE) Fact Sheet for Patients: BloggerCourse.com  Fact Sheet for Healthcare Providers: SeriousBroker.it  This test is not yet approved or cleared by the [place] FDA and has been authorized for detection and/or diagnosis of SARS-CoV-2 by FDA under an Emergency Use Authorization (EUA). This EUA will remain in effect (meaning this test can be used) for the duration of the COVID-19 declaration under Section 564(b)(1) of the Act, 21 U.S.C. section 360bbb-3(b)(1), unless the authorization is terminated or revoked.  Performed at [facility], [address] [place], [place] [address]   Culture, blood (Routine X 2) w Reflex to ID Panel     Status: Abnormal (Preliminary  result)   Collection Time: 04/06/23  6:05 PM   Specimen: BLOOD  Result Value Ref Range Status   Specimen Description   Final    BLOOD SITE NOT SPECIFIED Performed at [facility], [address], [place], [place] [address]    Special Requests   Final    BOTTLES DRAWN AEROBIC AND ANAEROBIC Blood Culture adequate volume Performed at [facility], [address], [place], [place] [address]    Culture  Setup Time   Final    GRAM POSITIVE COCCI IN CHAINS IN BOTH AEROBIC AND ANAEROBIC BOTTLES CRITICAL RESULT CALLED TO, READ BACK BY AND VERIFIED WITH: PHARMD ANH PHAM ON 04/07/23 @ 1347 BY DRT Performed at [facility], [address], [place], [place] [address]    Culture STREPTOCOCCUS GROUP G (A)  Final   Report Status PENDING  Incomplete  Culture, blood (Routine X 2) w Reflex to ID Panel     Status: Abnormal (Preliminary result)   Collection Time: 04/06/23  6:05 PM   Specimen: BLOOD  Result Value Ref Range Status   Specimen Description   Final    BLOOD SITE NOT SPECIFIED Performed at [facility], [address], [place], [place] [address]    Special Requests   Final    BOTTLES DRAWN AEROBIC AND ANAEROBIC Blood Culture adequate volume Performed at [facility], [address] [place], [place] [address]    Culture  Setup Time   Final    GRAM POSITIVE COCCI IN CHAINS IN BOTH AEROBIC AND ANAEROBIC BOTTLES CRITICAL RESULT CALLED TO, READ BACK BY AND VERIFIED WITH: PHARMD ANH PHAM ON 04/07/23 @ 1347 BY DRT    Culture (A)  Final    STREPTOCOCCUS GROUP G SUSCEPTIBILITIES TO FOLLOW Performed at [facility], [address], [place], [place] [address]    Report Status PENDING  Incomplete  Blood Culture ID Panel (Reflexed)     Status: Abnormal   Collection Time: 04/06/23  6:05 PM  Result Value Ref Range Status   Enterococcus faecalis NOT DETECTED NOT DETECTED Final   Enterococcus Faecium NOT  DETECTED NOT DETECTED Final   Listeria monocytogenes NOT DETECTED NOT DETECTED Final   Staphylococcus species NOT DETECTED NOT DETECTED Final   Staphylococcus aureus (BCID) NOT DETECTED NOT DETECTED Final   Staphylococcus epidermidis NOT DETECTED NOT DETECTED Final   Staphylococcus lugdunensis NOT DETECTED NOT DETECTED  Final   Streptococcus species DETECTED (A) NOT DETECTED Final    Comment: Not Enterococcus species, Streptococcus agalactiae, Streptococcus pyogenes, or Streptococcus pneumoniae. CRITICAL RESULT CALLED TO, READ BACK BY AND VERIFIED WITH: PHARMD ANH PHAM ON 04/07/23 @ 1345 BY DRT    Streptococcus agalactiae NOT DETECTED NOT DETECTED Final   Streptococcus pneumoniae NOT DETECTED NOT DETECTED Final   Streptococcus pyogenes NOT DETECTED NOT DETECTED Final   A.calcoaceticus-baumannii NOT DETECTED NOT DETECTED Final   Bacteroides fragilis NOT DETECTED NOT DETECTED Final   Enterobacterales NOT DETECTED NOT DETECTED Final   Enterobacter cloacae complex NOT DETECTED NOT DETECTED Final   Escherichia coli NOT DETECTED NOT DETECTED Final   Klebsiella aerogenes NOT DETECTED NOT DETECTED Final   Klebsiella oxytoca NOT DETECTED NOT DETECTED Final   Klebsiella pneumoniae NOT DETECTED NOT DETECTED Final   Proteus species NOT DETECTED NOT DETECTED Final   Salmonella species NOT DETECTED NOT DETECTED Final   Serratia marcescens NOT DETECTED NOT DETECTED Final   Haemophilus influenzae NOT DETECTED NOT DETECTED Final   Neisseria meningitidis NOT DETECTED NOT DETECTED Final   Pseudomonas aeruginosa NOT DETECTED NOT DETECTED Final   Stenotrophomonas maltophilia NOT DETECTED NOT DETECTED Final   Candida albicans NOT DETECTED NOT DETECTED Final   Candida auris NOT DETECTED NOT DETECTED Final   Candida glabrata NOT DETECTED NOT DETECTED Final   Candida krusei NOT DETECTED NOT DETECTED Final   Candida parapsilosis NOT DETECTED NOT DETECTED Final   Candida tropicalis NOT DETECTED NOT DETECTED  Final   Cryptococcus neoformans/gattii NOT DETECTED NOT DETECTED Final    Comment: Performed at [facility], [address], [place], [place] [address]  MRSA Next Gen by PCR, Nasal     Status: None   Collection Time: 04/07/23 11:27 AM   Specimen: Nasal Mucosa; Nasal Swab  Result Value Ref Range Status   MRSA by PCR Next Gen NOT DETECTED NOT DETECTED Final    Comment: (NOTE) The GeneXpert MRSA Assay (FDA approved for NASAL specimens only), is one component of a comprehensive MRSA colonization surveillance program. It is not intended to diagnose MRSA infection nor to guide or monitor treatment for MRSA infections. Test performance is not FDA approved in patients less than 22 years old. Performed at [facility], [address], [place], [place] [address]   Respiratory (~20 pathogens) panel by PCR     Status: Abnormal   Collection Time: 04/07/23  3:02 PM   Specimen: Nasopharyngeal Swab; Respiratory  Result Value Ref Range Status   Adenovirus NOT DETECTED NOT DETECTED Corrected   Coronavirus 229E NOT DETECTED NOT DETECTED Corrected    Comment: (NOTE) The Coronavirus on the Respiratory Panel, DOES NOT test for the novel  Coronavirus (2019 nCoV) CORRECTED ON 02/24 AT 2001: PREVIOUSLY REPORTED AS NOT DETECTED    Coronavirus HKU1 NOT DETECTED NOT DETECTED Corrected   Coronavirus NL63 NOT DETECTED NOT DETECTED Corrected   Coronavirus OC43 NOT DETECTED NOT DETECTED Corrected   Metapneumovirus NOT DETECTED NOT DETECTED Corrected   Rhinovirus / Enterovirus NOT DETECTED NOT DETECTED Corrected   Influenza A EQUIVOCAL (A) NOT DETECTED Corrected    Comment: Referred to [facility] in [place], [place] for serotyping.   Influenza B NOT DETECTED NOT DETECTED Corrected   Parainfluenza Virus 1 NOT DETECTED NOT DETECTED Corrected   Parainfluenza Virus 2 NOT DETECTED NOT DETECTED Corrected   Parainfluenza Virus 3 NOT DETECTED NOT DETECTED Corrected   Parainfluenza Virus 4  NOT DETECTED NOT DETECTED Corrected  Respiratory Syncytial Virus NOT DETECTED NOT DETECTED Corrected   Bordetella pertussis NOT DETECTED NOT DETECTED Corrected   Bordetella Parapertussis NOT DETECTED NOT DETECTED Corrected   Chlamydophila pneumoniae NOT DETECTED NOT DETECTED Corrected   Mycoplasma pneumoniae NOT DETECTED NOT DETECTED Corrected    Comment: Performed at [facility], [address], [place], [place] [address]     Labs: BNP (last 3 results) No results for input(s): "BNP" in the last 8760 hours. Basic Metabolic Panel: Recent Labs  Lab 04/06/23 1543 04/06/23 2157 04/07/23 0332 04/08/23 0507  NA 136  --  135 136  K 2.9*  --  2.9* 3.5  CL 98  --  98 99  CO2 29  --  28 27  GLUCOSE 105*  --  109* 127*  BUN 16  --  15 19  CREATININE 1.28*  --  1.48* 1.30*  CALCIUM 8.5*  --  7.9* 8.6*  MG  --  1.4* 1.4* 2.1   Liver Function Tests: Recent Labs  Lab 04/06/23 1543  AST 23  ALT 17  ALKPHOS 58  BILITOT 0.7  PROT 7.6  ALBUMIN 3.3*   No results for input(s): "LIPASE", "AMYLASE" in the last 168 hours. No results for input(s): "AMMONIA" in the last 168 hours. CBC: Recent Labs  Lab 04/06/23 1543 04/07/23 0332 04/08/23 0507  WBC 16.0* 17.0* 16.0*  NEUTROABS 14.0*  --   --   HGB 11.1* 11.0* 10.6*  HCT 36.1* 35.7* 35.0*  MCV 80.8 80.8 81.6  PLT 195 178 162   Cardiac Enzymes: No results for input(s): "CKTOTAL", "CKMB", "CKMBINDEX", "TROPONINI" in the last 168 hours. BNP: Invalid input(s): "POCBNP" CBG: No results for input(s): "GLUCAP" in the last 168 hours. D-Dimer No results for input(s): "DDIMER" in the last 72 hours. Hgb A1c No results for input(s): "HGBA1C" in the last 72 hours. Lipid Profile No results for input(s): "CHOL", "HDL", "LDLCALC", "TRIG", "CHOLHDL", "LDLDIRECT" in the last 72 hours. Thyroid function studies No results for input(s): "TSH", "T4TOTAL", "T3FREE", "THYROIDAB" in the last 72 hours.  Invalid input(s): "FREET3" Anemia  work up No results for input(s): "VITAMINB12", "FOLATE", "FERRITIN", "TIBC", "IRON", "RETICCTPCT" in the last 72 hours. Urinalysis    Component Value Date/Time   COLORURINE YELLOW 02/06/2021 1752   APPEARANCEUR HAZY (A) 02/06/2021 1752   LABSPEC 1.023 02/06/2021 1752   PHURINE 6.0 02/06/2021 1752   GLUCOSEU NEGATIVE 02/06/2021 1752   HGBUR NEGATIVE 02/06/2021 1752   BILIRUBINUR NEGATIVE 02/06/2021 1752   KETONESUR NEGATIVE 02/06/2021 1752   PROTEINUR 100 (A) 02/06/2021 1752   UROBILINOGEN 1.0 10/15/2008 1535   NITRITE NEGATIVE 02/06/2021 1752   LEUKOCYTESUR MODERATE (A) 02/06/2021 1752   Sepsis Labs Recent Labs  Lab 04/06/23 1543 04/07/23 0332 04/08/23 0507  WBC 16.0* 17.0* 16.0*   Microbiology Recent Results (from the past 240 hours)  Resp panel by RT-PCR (RSV, Flu A&B, Covid) Anterior Nasal Swab     Status: Abnormal   Collection Time: 04/06/23  2:25 PM   Specimen: Anterior Nasal Swab  Result Value Ref Range Status   SARS Coronavirus 2 by RT PCR NEGATIVE NEGATIVE Final    Comment: (NOTE) SARS-CoV-2 target nucleic acids are NOT DETECTED.  The SARS-CoV-2 RNA is generally detectable in upper respiratory specimens during the acute phase of infection. The lowest concentration of SARS-CoV-2 viral copies this assay can detect is 138 copies/mL. A negative result does not preclude SARS-Cov-2 infection and should not be used as the sole basis for treatment or other patient  management decisions. A negative result may occur with  improper specimen collection/handling, submission of specimen other than nasopharyngeal swab, presence of viral mutation(s) within the areas targeted by this assay, and inadequate number of viral copies(<138 copies/mL). A negative result must be combined with clinical observations, patient history, and epidemiological information. The expected result is Negative.  Fact Sheet for Patients:  BloggerCourse.com  Fact Sheet for  Healthcare Providers:  SeriousBroker.it  This test is no t yet approved or cleared by the [place] FDA and  has been authorized for detection and/or diagnosis of SARS-CoV-2 by FDA under an Emergency Use Authorization (EUA). This EUA will remain  in effect (meaning this test can be used) for the duration of the COVID-19 declaration under Section 564(b)(1) of the Act, 21 U.S.C.section 360bbb-3(b)(1), unless the authorization is terminated  or revoked sooner.       Influenza A by PCR POSITIVE (A) NEGATIVE Final   Influenza B by PCR NEGATIVE NEGATIVE Final    Comment: (NOTE) The Xpert Xpress SARS-CoV-2/FLU/RSV plus assay is intended as an aid in the diagnosis of influenza from Nasopharyngeal swab specimens and should not be used as a sole basis for treatment. Nasal washings and aspirates are unacceptable for Xpert Xpress SARS-CoV-2/FLU/RSV testing.  Fact Sheet for Patients: BloggerCourse.com  Fact Sheet for Healthcare Providers: SeriousBroker.it  This test is not yet approved or cleared by the [place] FDA and has been authorized for detection and/or diagnosis of SARS-CoV-2 by FDA under an Emergency Use Authorization (EUA). This EUA will remain in effect (meaning this test can be used) for the duration of the COVID-19 declaration under Section 564(b)(1) of the Act, 21 U.S.C. section 360bbb-3(b)(1), unless the authorization is terminated or revoked.     Resp Syncytial Virus by PCR NEGATIVE NEGATIVE Final    Comment: (NOTE) Fact Sheet for Patients: BloggerCourse.com  Fact Sheet for Healthcare Providers: SeriousBroker.it  This test is not yet approved or cleared by the [place] FDA and has been authorized for detection and/or diagnosis of SARS-CoV-2 by FDA under an Emergency Use Authorization (EUA). This EUA will remain in effect (meaning  this test can be used) for the duration of the COVID-19 declaration under Section 564(b)(1) of the Act, 21 U.S.C. section 360bbb-3(b)(1), unless the authorization is terminated or revoked.  Performed at [facility], [address] [place], [place] [address]   Culture, blood (Routine X 2) w Reflex to ID Panel     Status: Abnormal (Preliminary result)   Collection Time: 04/06/23  6:05 PM   Specimen: BLOOD  Result Value Ref Range Status   Specimen Description   Final    BLOOD SITE NOT SPECIFIED Performed at [facility], [address], [place], [place] [address]    Special Requests   Final    BOTTLES DRAWN AEROBIC AND ANAEROBIC Blood Culture adequate volume Performed at [facility], [address], [place], [place] [address]    Culture  Setup Time   Final    GRAM POSITIVE COCCI IN CHAINS IN BOTH AEROBIC AND ANAEROBIC BOTTLES CRITICAL RESULT CALLED TO, READ BACK BY AND VERIFIED WITH: PHARMD ANH PHAM ON 04/07/23 @ 1347 BY DRT Performed at [facility], [address], [place], [place] [address]    Culture STREPTOCOCCUS GROUP G (A)  Final   Report Status PENDING  Incomplete  Culture, blood (Routine X 2) w Reflex to ID Panel     Status: Abnormal (Preliminary result)   Collection  Time: 04/06/23  6:05 PM   Specimen: BLOOD  Result Value Ref Range Status   Specimen Description   Final    BLOOD SITE NOT SPECIFIED Performed at [facility], [address] [place], [place] [address]    Special Requests   Final    BOTTLES DRAWN AEROBIC AND ANAEROBIC Blood Culture adequate volume Performed at [facility], [address] [place], [place] [address]    Culture  Setup Time   Final    GRAM POSITIVE COCCI IN CHAINS IN BOTH AEROBIC AND ANAEROBIC BOTTLES CRITICAL RESULT CALLED TO, READ BACK BY AND VERIFIED WITH: PHARMD ANH PHAM ON 04/07/23 @ 1347 BY DRT    Culture (A)  Final     STREPTOCOCCUS GROUP G SUSCEPTIBILITIES TO FOLLOW Performed at [facility], [address], [place], [place] [address]    Report Status PENDING  Incomplete  Blood Culture ID Panel (Reflexed)     Status: Abnormal   Collection Time: 04/06/23  6:05 PM  Result Value Ref Range Status   Enterococcus faecalis NOT DETECTED NOT DETECTED Final   Enterococcus Faecium NOT DETECTED NOT DETECTED Final   Listeria monocytogenes NOT DETECTED NOT DETECTED Final   Staphylococcus species NOT DETECTED NOT DETECTED Final   Staphylococcus aureus (BCID) NOT DETECTED NOT DETECTED Final   Staphylococcus epidermidis NOT DETECTED NOT DETECTED Final   Staphylococcus lugdunensis NOT DETECTED NOT DETECTED Final   Streptococcus species DETECTED (A) NOT DETECTED Final    Comment: Not Enterococcus species, Streptococcus agalactiae, Streptococcus pyogenes, or Streptococcus pneumoniae. CRITICAL RESULT CALLED TO, READ BACK BY AND VERIFIED WITH: PHARMD ANH PHAM ON 04/07/23 @ 1345 BY DRT    Streptococcus agalactiae NOT DETECTED NOT DETECTED Final   Streptococcus pneumoniae NOT DETECTED NOT DETECTED Final   Streptococcus pyogenes NOT DETECTED NOT DETECTED Final   A.calcoaceticus-baumannii NOT DETECTED NOT DETECTED Final   Bacteroides fragilis NOT DETECTED NOT DETECTED Final   Enterobacterales NOT DETECTED NOT DETECTED Final   Enterobacter cloacae complex NOT DETECTED NOT DETECTED Final   Escherichia coli NOT DETECTED NOT DETECTED Final   Klebsiella aerogenes NOT DETECTED NOT DETECTED Final   Klebsiella oxytoca NOT DETECTED NOT DETECTED Final   Klebsiella pneumoniae NOT DETECTED NOT DETECTED Final   Proteus species NOT DETECTED NOT DETECTED Final   Salmonella species NOT DETECTED NOT DETECTED Final   Serratia marcescens NOT DETECTED NOT DETECTED Final   Haemophilus influenzae NOT DETECTED NOT DETECTED Final   Neisseria meningitidis NOT DETECTED NOT DETECTED Final   Pseudomonas aeruginosa NOT DETECTED NOT DETECTED  Final   Stenotrophomonas maltophilia NOT DETECTED NOT DETECTED Final   Candida albicans NOT DETECTED NOT DETECTED Final   Candida auris NOT DETECTED NOT DETECTED Final   Candida glabrata NOT DETECTED NOT DETECTED Final   Candida krusei NOT DETECTED NOT DETECTED Final   Candida parapsilosis NOT DETECTED NOT DETECTED Final   Candida tropicalis NOT DETECTED NOT DETECTED Final   Cryptococcus neoformans/gattii NOT DETECTED NOT DETECTED Final    Comment: Performed at [facility], [address] [place], [place] [address]  MRSA Next Gen by PCR, Nasal     Status: None   Collection Time: 04/07/23 11:27 AM   Specimen: Nasal Mucosa; Nasal Swab  Result Value Ref Range Status   MRSA by PCR Next Gen NOT DETECTED NOT DETECTED Final    Comment: (NOTE) The GeneXpert MRSA Assay (FDA approved for NASAL specimens only), is one component of a comprehensive MRSA colonization surveillance  program. It is not intended to diagnose MRSA infection nor to guide or monitor treatment for MRSA infections. Test performance is not FDA approved in patients less than 62 years old. Performed at [facility], [address], [place] [place], [place] [address]   Respiratory (~20 pathogens) panel by PCR     Status: Abnormal   Collection Time: 04/07/23  3:02 PM   Specimen: Nasopharyngeal Swab; Respiratory  Result Value Ref Range Status   Adenovirus NOT DETECTED NOT DETECTED Corrected   Coronavirus 229E NOT DETECTED NOT DETECTED Corrected    Comment: (NOTE) The Coronavirus on the Respiratory Panel, DOES NOT test for the novel  Coronavirus (2019 nCoV) CORRECTED ON 02/24 AT 2001: PREVIOUSLY REPORTED AS NOT DETECTED    Coronavirus HKU1 NOT DETECTED NOT DETECTED Corrected   Coronavirus NL63 NOT DETECTED NOT DETECTED Corrected   Coronavirus OC43 NOT DETECTED NOT DETECTED Corrected   Metapneumovirus NOT DETECTED NOT DETECTED Corrected   Rhinovirus / Enterovirus NOT DETECTED NOT DETECTED Corrected    Influenza A EQUIVOCAL (A) NOT DETECTED Corrected    Comment: Referred to [facility] in [place], [place] for serotyping.   Influenza B NOT DETECTED NOT DETECTED Corrected   Parainfluenza Virus 1 NOT DETECTED NOT DETECTED Corrected   Parainfluenza Virus 2 NOT DETECTED NOT DETECTED Corrected   Parainfluenza Virus 3 NOT DETECTED NOT DETECTED Corrected   Parainfluenza Virus 4 NOT DETECTED NOT DETECTED Corrected   Respiratory Syncytial Virus NOT DETECTED NOT DETECTED Corrected   Bordetella pertussis NOT DETECTED NOT DETECTED Corrected   Bordetella Parapertussis NOT DETECTED NOT DETECTED Corrected   Chlamydophila pneumoniae NOT DETECTED NOT DETECTED Corrected   Mycoplasma pneumoniae NOT DETECTED NOT DETECTED Corrected    Comment: Performed at [facility], [address], [place], [place] [address]    Please note: You were cared for by a hospitalist during your hospital stay. Once you are discharged, your primary care physician will handle any further medical issues. Please note that NO REFILLS for any discharge medications will be authorized once you are discharged, as it is imperative that you return to your primary care physician (or establish a relationship with a primary care physician if you do not have one) for your post hospital discharge needs so that they can reassess your need for medications and monitor your lab values.    Time coordinating discharge: 40 minutes  SIGNED:   Burnadette Pop, MD  [facility] 04/08/2023, 11:21 AM Pager [phone]  If 7PM-7AM, please contact night-coverage [email or web address] Password [email or web address]

## 2023-04-09 LAB — CULTURE, BLOOD (ROUTINE X 2)
Special Requests: ADEQUATE
Special Requests: ADEQUATE

## 2023-04-14 ENCOUNTER — Emergency Department (HOSPITAL_BASED_OUTPATIENT_CLINIC_OR_DEPARTMENT_OTHER)

## 2023-04-14 ENCOUNTER — Emergency Department (HOSPITAL_COMMUNITY)
Admission: EM | Admit: 2023-04-14 | Discharge: 2023-04-14 | Disposition: A | Discharge status: Home / Self Care | Attending: Emergency Medicine | Admitting: Emergency Medicine

## 2023-04-14 DIAGNOSIS — R600 Localized edema: Secondary | ICD-10-CM | POA: Diagnosis not present

## 2023-04-14 DIAGNOSIS — R609 Edema, unspecified: Secondary | ICD-10-CM | POA: Diagnosis not present

## 2023-04-14 DIAGNOSIS — I10 Essential (primary) hypertension: Secondary | ICD-10-CM | POA: Insufficient documentation

## 2023-04-14 DIAGNOSIS — M79604 Pain in right leg: Secondary | ICD-10-CM | POA: Diagnosis present

## 2023-04-14 DIAGNOSIS — Z79899 Other long term (current) drug therapy: Secondary | ICD-10-CM | POA: Diagnosis not present

## 2023-04-14 DIAGNOSIS — L03115 Cellulitis of right lower limb: Secondary | ICD-10-CM | POA: Diagnosis not present

## 2023-04-14 LAB — CBC WITH DIFFERENTIAL/PLATELET
Abs Immature Granulocytes: 0.25 10*3/uL — ABNORMAL HIGH (ref 0.00–0.07)
Basophils Absolute: 0 10*3/uL (ref 0.0–0.1)
Basophils Relative: 0 %
Eosinophils Absolute: 0.2 10*3/uL (ref 0.0–0.5)
Eosinophils Relative: 2 %
HCT: 38.5 % — ABNORMAL LOW (ref 39.0–52.0)
Hemoglobin: 11.4 g/dL — ABNORMAL LOW (ref 13.0–17.0)
Immature Granulocytes: 3 %
Lymphocytes Relative: 26 %
Lymphs Abs: 2.4 10*3/uL (ref 0.7–4.0)
MCH: 24.3 pg — ABNORMAL LOW (ref 26.0–34.0)
MCHC: 29.6 g/dL — ABNORMAL LOW (ref 30.0–36.0)
MCV: 82.1 fL (ref 80.0–100.0)
Monocytes Absolute: 0.8 10*3/uL (ref 0.1–1.0)
Monocytes Relative: 9 %
Neutro Abs: 5.5 10*3/uL (ref 1.7–7.7)
Neutrophils Relative %: 60 %
Platelets: 292 10*3/uL (ref 150–400)
RBC: 4.69 MIL/uL (ref 4.22–5.81)
RDW: 17.7 % — ABNORMAL HIGH (ref 11.5–15.5)
WBC: 9.2 10*3/uL (ref 4.0–10.5)
nRBC: 0 % (ref 0.0–0.2)

## 2023-04-14 LAB — BASIC METABOLIC PANEL
Anion gap: 7 (ref 5–15)
BUN: 20 mg/dL (ref 6–20)
CO2: 33 mmol/L — ABNORMAL HIGH (ref 22–32)
Calcium: 8.4 mg/dL — ABNORMAL LOW (ref 8.9–10.3)
Chloride: 98 mmol/L (ref 98–111)
Creatinine, Ser: 1.35 mg/dL — ABNORMAL HIGH (ref 0.61–1.24)
GFR, Estimated: >60 mL/min (ref >60)
Glucose, Bld: 99 mg/dL (ref 70–99)
Potassium: 3 mmol/L — ABNORMAL LOW (ref 3.5–5.1)
Sodium: 138 mmol/L (ref 135–145)

## 2023-04-14 LAB — MAGNESIUM: Magnesium: 1.9 mg/dL (ref 1.7–2.4)

## 2023-04-14 MED ORDER — ACETAMINOPHEN 500 MG PO TABS
1000.0000 mg | ORAL_TABLET | Freq: Once | ORAL | Status: AC
  Administered 2023-04-14: 1000 mg via ORAL
  Filled 2023-04-14: qty 2

## 2023-04-14 MED ORDER — CEPHALEXIN 500 MG PO CAPS: 500.0000 mg | ORAL_CAPSULE | Freq: Three times a day (TID) | ORAL | 0 refills | Status: AC

## 2023-04-14 MED ORDER — POTASSIUM CHLORIDE 20 MEQ PO PACK
60.0000 meq | PACK | Freq: Once | ORAL | Status: AC
  Administered 2023-04-14: 60 meq via ORAL
  Filled 2023-04-14: qty 3

## 2023-04-14 MED ORDER — OXYCODONE HCL 5 MG PO TABS
10.0000 mg | ORAL_TABLET | Freq: Once | ORAL | Status: AC
  Administered 2023-04-14: 10 mg via ORAL
  Filled 2023-04-14: qty 2

## 2023-04-14 NOTE — Progress Notes (Signed)
 Right lower extremity venous duplex has been completed. Preliminary results can be found in CV Proc through chart review.  Results were given to Marita Kansas PA.  04/14/23 8:51 AM Olen Cordial RVT

## 2023-04-14 NOTE — ED Triage Notes (Signed)
 Right leg pain x Wednesday , pt reports pain is sharp, pt reports on lasix but ran out, but got some lasix from a friend of him to make the leg swelling go down.

## 2023-04-14 NOTE — Discharge Instructions (Signed)
 Your ultrasound did not show any concern for blood clot.  Blood work was otherwise reassuring.  Your potassium was slightly low.  Have given you a potassium supplement to take over the next several days.  Please follow-up with your primary care doctor to have your potassium level rechecked in the next 3 days.  I have sent antibiotic in for potential skin infection.  You do have warmth to your leg.  If you have any concerning symptoms please return to the emergency room.

## 2023-04-14 NOTE — ED Provider Notes (Signed)
 [place] EMERGENCY DEPARTMENT AT [facility] Provider Note   CSN: [id number] Arrival date & time: 04/14/23  4098     History  Chief Complaint  Patient presents with   Leg Pain    Jared Mccoy is a 33 y.o. adult.  33 year old male presents today for concern of right leg pain.  He states that it started on Wednesday.  He came into the emergency room at that time and was diagnosed with the flu.  He was subsequently admitted for 3 days for flu and was ultimately discharged.  He states his leg pain had resolved shortly after arriving to the emergency department on Wednesday.  He states he attributed this to swelling because he fell asleep upright.  He sits upright for prolonged period of time he does develop lower extremity edema.  He states that he was sent home with the Lasix from the hospital and then he developed edema again after discharge.  He received some Lasix from his friend however this did not help so he returns today for evaluation as he is having sharp pain in his right leg.  Denies fever or other complaints.  The history is provided by the patient. No language interpreter was used.       Home Medications Prior to Admission medications   Medication Sig Start Date End Date Taking? Authorizing Provider  amLODipine (NORVASC) 10 MG tablet Take 1 tablet (10 mg total) by mouth daily. 04/08/23   Burnadette Pop, MD  carvedilol (COREG) 25 MG tablet Take 1 tablet (25 mg total) by mouth 2 (two) times daily with a meal. 04/08/23   Burnadette Pop, MD  hydrALAZINE (APRESOLINE) 50 MG tablet Take 1 tablet (50 mg total) by mouth every 8 (eight) hours. 04/08/23   Burnadette Pop, MD  linezolid (ZYVOX) 600 MG tablet Take 1 tablet (600 mg total) by mouth 2 (two) times daily for 10 days. 04/08/23 04/18/23  Burnadette Pop, MD  polyethylene glycol (MIRALAX / GLYCOLAX) 17 g packet Take 17 g by mouth daily as needed for mild constipation. Patient not taking: Reported on 04/07/2023 08/12/22    Merlene Laughter, DO      Allergies    Patient has no known allergies.    Review of Systems   Review of Systems  Constitutional:  Negative for chills and fever.  Respiratory:  Negative for shortness of breath.   Cardiovascular:  Positive for leg swelling. Negative for chest pain.  Neurological:  Negative for light-headedness.  All other systems reviewed and are negative.   Physical Exam Updated Vital Signs BP (!) 208/131 (BP Location: Right Arm)   Pulse 98   Temp 98 F (36.7 C) (Oral)   Resp 20   Ht 6' (1.829 m)   Wt (!) 226.8 kg   SpO2 96%   BMI 67.81 kg/m  Physical Exam Vitals and nursing note reviewed.  Constitutional:      General: He is not in acute distress.    Appearance: Normal appearance. He is obese. He is not ill-appearing.  HENT:     Head: Normocephalic and atraumatic.     Nose: Nose normal.  Eyes:     Conjunctiva/sclera: Conjunctivae normal.  Cardiovascular:     Rate and Rhythm: Normal rate and regular rhythm.  Pulmonary:     Effort: Pulmonary effort is normal. No respiratory distress.  Musculoskeletal:        General: No deformity. Normal range of motion.     Cervical back: Normal range of  motion.     Right lower leg: Edema present.     Left lower leg: Edema present.     Comments: Bilateral lower extremity swelling.  Nonpitting.  Open superficial wound to posterior right lower leg.  No drainage.  Right lower extremity is warm compared to the left.  Neurovascularly intact.  Skin:    Findings: No rash.  Neurological:     Mental Status: He is alert.     ED Results / Procedures / Treatments   Labs (all labs ordered are listed, but only abnormal results are displayed) Labs Reviewed  CBC WITH DIFFERENTIAL/PLATELET  BASIC METABOLIC PANEL    EKG None  Radiology No results found.  Procedures Procedures    Medications Ordered in ED Medications  acetaminophen (TYLENOL) tablet 1,000 mg (1,000 mg Oral Given 04/14/23 0717)  oxyCODONE (Oxy  IR/ROXICODONE) immediate release tablet 10 mg (10 mg Oral Given 04/14/23 1610)    ED Course/ Medical Decision Making/ A&P                                 Medical Decision Making Amount and/or Complexity of Data Reviewed Labs: ordered.  Risk OTC drugs. Prescription drug management.   Medical Decision Making / ED Course   This patient presents to the ED for concern of back pain, this involves an extensive number of treatment options, and is a complaint that carries with it a high risk of complications and morbidity.  The differential diagnosis includes DVT, muscle strain, cellulitis edema  MDM: 33 year old male presents today for concern of right leg pain.  This started on Wednesday.  His leg is warm.  Neurovascularly intact.  There is a superficial wound on the posterior aspect of the right lower leg.  No drainage.  No concern for abscess. Will obtain DVT study.  DVT study is negative.  Pain improved after medications.  Hypertension improved. Keflex prescribed.  Patient voices understanding and is in agreement with plan.  To establish care with a PCP provider.    Lab Tests: -I ordered, reviewed, and interpreted labs.   The pertinent results include:   Labs Reviewed  CBC WITH DIFFERENTIAL/PLATELET - Abnormal; Notable for the following components:      Result Value   Hemoglobin 11.4 (*)    HCT 38.5 (*)    MCH 24.3 (*)    MCHC 29.6 (*)    RDW 17.7 (*)    Abs Immature Granulocytes 0.25 (*)    All other components within normal limits  BASIC METABOLIC PANEL - Abnormal; Notable for the following components:   Potassium 3.0 (*)    CO2 33 (*)    Creatinine, Ser 1.35 (*)    Calcium 8.4 (*)    All other components within normal limits  MAGNESIUM      EKG  EKG Interpretation Date/Time:    Ventricular Rate:    PR Interval:    QRS Duration:    QT Interval:    QTC Calculation:   R Axis:      Text Interpretation:           Imaging Studies ordered: I ordered imaging  studies including dvt study I independently visualized and interpreted imaging. I agree with the radiologist interpretation   Medicines ordered and prescription drug management: Meds ordered this encounter  Medications   acetaminophen (TYLENOL) tablet 1,000 mg   oxyCODONE (Oxy IR/ROXICODONE) immediate release tablet 10 mg    Refill:  0   potassium chloride (KLOR-CON) packet 60 mEq   cephALEXin (KEFLEX) 500 MG capsule    Sig: Take 1 capsule (500 mg total) by mouth 3 (three) times daily for 7 days.    Dispense:  20 capsule    Refill:  0    Supervising Provider:   Eber Hong [[id number]]    -I have reviewed the patients home medicines and have made adjustments as needed   Reevaluation: After the interventions noted above, I reevaluated the patient and found that they have :improved  Co morbidities that complicate the patient evaluation  Past Medical History:  Diagnosis Date   Hypertension    not on medication   Obese    Peripheral vascular disease (HCC)    edema in legs       Dispostion: Discharged in stable condition.  Return precaution discussed.  Patient voiced understanding and is in agreement with the plan.   Final Clinical Impression(s) / ED Diagnoses Final diagnoses:  Cellulitis of right lower extremity    Rx / DC Orders ED Discharge Orders          Ordered    cephALEXin (KEFLEX) 500 MG capsule  3 times daily        04/14/23 1053              Marita Kansas, [place] 04/14/23 1111    Alvira Monday, MD 04/16/23 1208

## 2023-04-15 ENCOUNTER — Ambulatory Visit: Payer: Self-pay | Admitting: Physician Assistant

## 2023-04-15 NOTE — Telephone Encounter (Signed)
 Copied from CRM [id number]. Topic: Clinical - Red Word Triage >> Apr 15, 2023 11:32 AM Patsy Lager T wrote: Red Word that prompted transfer to Nurse Triage: patient sister Sigurd Sos said patients legs burst open and he is not able to walk, sleep or do anything. Patient is in excruciating pain and she does not know what to do about the pain   Chief Complaint: Leg swelling  Symptoms: Bilateral leg swelling, leg pain, some shortness of breath when walking  Frequency: Constant  Disposition: [] ED /[] Urgent Care (no appt availability in office) / [x] Appointment(In office/virtual)/ []  [facility]/ [] Home Care/ [] Refused Recommended Disposition /[] [facility]/ []  Follow-up with PCP Additional Notes: Patient has been having an ongoing problem with leg swelling and pain. He states that he has two chronic wounds to the back of his legs that are open. He states he was seen in the ED yesterday and prescribed an antibiotic but was not given anything for his pain. Appointment made for the patient tomorrow for evaluation and treatment. Patient instructed to call back for new or worsening symptoms or to seek care at urgent care of the ED if needed. Patient verbalized understanding and agreement with this plan.      Reason for Disposition  [1] MODERATE leg swelling (e.g., swelling extends up to knees) AND [2] new-onset or worsening  Answer Assessment - Initial Assessment Questions 1. ONSET: "When did the swelling start?" (e.g., minutes, hours, days)     Chronic problem  2. LOCATION: "What part of the leg is swollen?"  "Are both legs swollen or just one leg?"     Both legs 3. SEVERITY: "How bad is the swelling?" (e.g., localized; mild, moderate, severe)   - Localized: Small area of swelling localized to one leg.   - MILD pedal edema: Swelling limited to foot and ankle, pitting edema < 1/4 inch (6 mm) deep, rest and elevation eliminate most or all swelling.   - MODERATE edema: Swelling of lower leg  to knee, pitting edema > 1/4 inch (6 mm) deep, rest and elevation only partially reduce swelling.   - SEVERE edema: Swelling extends above knee, facial or hand swelling present.      Moderate to severe  5. PAIN: "Is the swelling painful to touch?" If Yes, ask: "How painful is it?"   (Scale 1-10; mild, moderate or severe)     10/10 6. FEVER: "Do you have a fever?" If Yes, ask: "What is it, how was it measured, and when did it start?"      Unsure  7. CAUSE: "What do you think is causing the leg swelling?"     Chronic problem  8. MEDICAL HISTORY: "Do you have a history of blood clots (e.g., DVT), cancer, heart failure, kidney disease, or liver failure?"     Acute kidney injury  9. RECURRENT SYMPTOM: "Have you had leg swelling before?" If Yes, ask: "When was the last time?" "What happened that time?"     Yes 10. OTHER SYMPTOMS: "Do you have any other symptoms?" (e.g., chest pain, difficulty breathing)       Difficulty breathing when walking  Protocols used: Leg Swelling and Edema-A-AH

## 2023-04-16 ENCOUNTER — Encounter (INDEPENDENT_AMBULATORY_CARE_PROVIDER_SITE_OTHER): Payer: Self-pay | Admitting: Primary Care

## 2023-04-16 ENCOUNTER — Ambulatory Visit (INDEPENDENT_AMBULATORY_CARE_PROVIDER_SITE_OTHER): Admitting: Primary Care

## 2023-04-16 VITALS — BP 154/92 | HR 95 | Temp 98.5°F | Resp 22 | Ht 72.0 in | Wt >= 6400 oz

## 2023-04-16 DIAGNOSIS — I10 Essential (primary) hypertension: Secondary | ICD-10-CM

## 2023-04-16 DIAGNOSIS — Z09 Encounter for follow-up examination after completed treatment for conditions other than malignant neoplasm: Secondary | ICD-10-CM

## 2023-04-16 DIAGNOSIS — Z760 Encounter for issue of repeat prescription: Secondary | ICD-10-CM

## 2023-04-16 DIAGNOSIS — I890 Lymphedema, not elsewhere classified: Secondary | ICD-10-CM

## 2023-04-16 MED ORDER — HYDRALAZINE HCL 50 MG PO TABS
50.0000 mg | ORAL_TABLET | Freq: Three times a day (TID) | ORAL | 1 refills | Status: DC
  Filled 2023-04-16 – 2023-04-17 (×2): qty 90, 30d supply, fill #0
  Filled 2023-06-02: qty 90, 30d supply, fill #1

## 2023-04-16 MED ORDER — CARVEDILOL 25 MG PO TABS
25.0000 mg | ORAL_TABLET | Freq: Two times a day (BID) | ORAL | 0 refills | Status: DC
  Filled 2023-04-16: qty 180, 90d supply, fill #0
  Filled 2023-04-17: qty 60, 30d supply, fill #0
  Filled 2023-06-02: qty 60, 30d supply, fill #1

## 2023-04-16 MED ORDER — AMLODIPINE BESYLATE 10 MG PO TABS
10.0000 mg | ORAL_TABLET | Freq: Every day | ORAL | 0 refills | Status: DC
  Filled 2023-04-16: qty 90, 90d supply, fill #0
  Filled 2023-04-17: qty 30, 30d supply, fill #0
  Filled 2023-06-02: qty 30, 30d supply, fill #1

## 2023-04-16 MED ORDER — IBUPROFEN 800 MG PO TABS
800.0000 mg | ORAL_TABLET | Freq: Three times a day (TID) | ORAL | 0 refills | Status: DC | PRN
  Filled 2023-04-16: qty 90, 30d supply, fill #0

## 2023-04-16 NOTE — Progress Notes (Signed)
 [facility]  Bon Jared Mccoy, is a 33 y.o. adult  ZOX:[id number]  [id number]  DOB - 09-13-1990  Chief Complaint  Patient presents with   Hospitalization Follow-up       Subjective:   Jared Mccoy is a 33 y.o. adult here today for a emergency room follow visit.  He presented to the emergency on 04/14/2023 for leg pain this pain started 3 days prior to arrival to the ED and was diagnosed with cellulitis of the right lower extremity.  He was sent home from his prior hospital visit with Lasix but ran out and bought some from her friend that was no longer helping with the edema.  He also was having sharp pain in his right leg.  DVT was done and was negative.  Patient has No headache, No chest pain, No abdominal pain - No Nausea, No new weakness tingling or numbness, No Cough - shortness of breath HPI  No problems updated.  Comprehensive ROS Pertinent positive and negative noted in HPI   No Known Allergies  Past Medical History:  Diagnosis Date   Hypertension    not on medication   Obese    Peripheral vascular disease (HCC)    edema in legs     Current Outpatient Medications on File Prior to Visit  Medication Sig Dispense Refill   polyethylene glycol (MIRALAX / GLYCOLAX) 17 g packet Take 17 g by mouth daily as needed for mild constipation. (Patient not taking: Reported on 04/07/2023) 14 each 0   No current facility-administered medications on file prior to visit.   Health Maintenance  Topic Date Due   Pneumococcal Vaccination (1 of 2 - PCV) Never done   Flu Shot  Never done   COVID-19 Vaccine (1 - 2024-25 season) Never done   DTaP/Tdap/Td vaccine (5 - Td or Tdap) 08/11/2029   Hepatitis C Screening  Completed   HIV Screening  Completed   HPV Vaccine  Aged Out    Objective:   Vitals:   04/16/23 1634 04/16/23 1802  BP: (!) 216/117 (!) 154/92  Pulse: 95   Resp: (!) 22   Temp: 98.5 F (36.9 C)   TempSrc: Other (Comment)   SpO2: 97%   Weight: (!) 538 lb  (244 kg)   Height: 6' (1.829 m)     Physical Exam Vitals reviewed.  Constitutional:      Appearance: He is obese.  HENT:     Right Ear: Tympanic membrane, ear canal and external ear normal.     Left Ear: Tympanic membrane, ear canal and external ear normal.     Nose: Nose normal.  Eyes:     Extraocular Movements: Extraocular movements intact.     Pupils: Pupils are equal, round, and reactive to light.  Cardiovascular:     Rate and Rhythm: Normal rate and regular rhythm.  Pulmonary:     Effort: Pulmonary effort is normal.     Breath sounds: Normal breath sounds.     Comments: Shortness of breath with exertion Abdominal:     General: Abdomen is flat. There is distension.     Palpations: Abdomen is soft.  Musculoskeletal:        General: Normal range of motion.     Cervical back: Normal range of motion and neck supple.  Skin:    General: Skin is warm and dry.     Comments: superficial wound on the posterior aspect of the right lower leg.  No signs and symptoms of infection  Neurological:  Mental Status: He is oriented to person, place, and time.  Psychiatric:        Mood and Affect: Mood normal.        Behavior: Behavior normal.    Assessment & Plan  Obe was seen today for hospitalization follow-up.  Diagnoses and all orders for this visit:  Hospital discharge follow-up See HPI  Essential hypertension BP goal - < 130/80 Explained that having normal blood pressure is the goal and medications are helping to get to goal and maintain normal blood pressure. DIET: Limit salt intake, read nutrition labels to check salt content, limit fried and high fatty foods  Avoid using multisymptom OTC cold preparations that generally contain sudafed which can rise BP. Consult with pharmacist on best cold relief products to use for persons with HTN EXERCISE Discussed incorporating exercise such as walking - 30 minutes most days of the week and can do in 10 minute intervals      Lymphedema  Tx with keflex completed refer to lymphoma   Other orders 2/2 Medication refill  -     ibuprofen (ADVIL) 800 MG tablet; Take 1 tablet (800 mg total) by mouth every 8 (eight) hours as needed. -     hydrALAZINE (APRESOLINE) 50 MG tablet; Take 1 tablet (50 mg total) by mouth 3 (three) times daily. -     carvedilol (COREG) 25 MG tablet; Take 1 tablet (25 mg total) by mouth 2 (two) times daily with a meal. -     amLODipine (NORVASC) 10 MG tablet; Take 1 tablet (10 mg total) by mouth daily.     Patient have been counseled extensively about nutrition and exercise. Other issues discussed during this visit include: low cholesterol diet, weight control and daily exercise, foot care, annual eye examinations at Ophthalmology, importance of adherence with medications and regular follow-up. We also discussed long term complications of uncontrolled diabetes and hypertension.   Return in about 2 weeks (around 04/30/2023).  The patient was given clear instructions to go to ER or return to medical center if symptoms don't improve, worsen or new problems develop. The patient verbalized understanding. The patient was told to call to get lab results if they haven't heard anything in the next week.   This note has been created with Education officer, environmental. Any transcriptional errors are unintentional.   Grayce Sessions, NP 04/25/2023, 9:42 AM

## 2023-04-17 ENCOUNTER — Other Ambulatory Visit: Payer: Self-pay

## 2023-04-28 ENCOUNTER — Encounter (INDEPENDENT_AMBULATORY_CARE_PROVIDER_SITE_OTHER): Payer: Self-pay | Admitting: Primary Care

## 2023-04-30 ENCOUNTER — Telehealth (INDEPENDENT_AMBULATORY_CARE_PROVIDER_SITE_OTHER): Payer: Self-pay | Admitting: Primary Care

## 2023-04-30 NOTE — Telephone Encounter (Signed)
 Provider will not be in office on 3/21 pt is rescheduled for 3/24

## 2023-05-01 ENCOUNTER — Ambulatory Visit (INDEPENDENT_AMBULATORY_CARE_PROVIDER_SITE_OTHER)

## 2023-05-02 ENCOUNTER — Ambulatory Visit (INDEPENDENT_AMBULATORY_CARE_PROVIDER_SITE_OTHER)

## 2023-05-05 ENCOUNTER — Ambulatory Visit (INDEPENDENT_AMBULATORY_CARE_PROVIDER_SITE_OTHER): Admitting: Primary Care

## 2023-05-05 ENCOUNTER — Telehealth: Payer: Self-pay

## 2023-05-05 ENCOUNTER — Encounter (INDEPENDENT_AMBULATORY_CARE_PROVIDER_SITE_OTHER): Payer: Self-pay

## 2023-05-05 VITALS — BP 130/88 | HR 76 | Temp 98.1°F

## 2023-05-05 DIAGNOSIS — I10 Essential (primary) hypertension: Secondary | ICD-10-CM

## 2023-05-05 DIAGNOSIS — I890 Lymphedema, not elsewhere classified: Secondary | ICD-10-CM

## 2023-05-05 DIAGNOSIS — G4733 Obstructive sleep apnea (adult) (pediatric): Secondary | ICD-10-CM

## 2023-05-05 DIAGNOSIS — R7303 Prediabetes: Secondary | ICD-10-CM

## 2023-05-05 LAB — POCT GLYCOSYLATED HEMOGLOBIN (HGB A1C): HbA1c, POC (prediabetic range): 5.8 % (ref 5.7–6.4)

## 2023-05-05 NOTE — Progress Notes (Signed)
 [facility]   Jared Mccoy is a 33 y.o. adult presents for hypertension evaluation, Denies shortness of breath, headaches, chest pain or lower extremity edema, sudden onset, vision changes, unilateral weakness, dizziness, paresthesias  Patient presents with possible obstructive sleep apnea. Patent has a 10 years history of symptoms of daytime fatigue, morning fatigue, morning headache, and hypertension. Patient generally gets 3 or 4 hours of sleep per night, and states they generally have nightime awakenings. Snoring of severe severity is present. Apneic episodes is present. Nasal obstruction is not present.  Patient has not had tonsillectomy.   Patient reports adherence with medications.  Dietary habits include: change cakes to fruits decrease fried food and change  Exercise habits include:walking Family / Social history: CVA- father 60y/o died    Past Medical History:  Diagnosis Date   Hypertension    not on medication   Obese    Peripheral vascular disease (HCC)    edema in legs    Past Surgical History:  Procedure Laterality Date   MANDIBULAR HARDWARE REMOVAL N/A 12/27/2016   Procedure: MANDIBULAR HARDWARE REMOVAL;  Surgeon: Newman Pies, MD;  Location: [facility] OR;  Service: ENT;  Laterality: N/A;   OPEN REDUCTION INTERNAL FIXATION (ORIF) DISTAL RADIAL FRACTURE Left 11/10/2016   Procedure: OPEN REDUCTION INTERNAL FIXATION (ORIF) DISTAL RADIAL FRACTURE;  Surgeon: Mack Hook, MD;  Location: [facility] OR;  Service: Orthopedics;  Laterality: Left;   ORIF MANDIBULAR FRACTURE Right 11/10/2016   Procedure: OPEN REDUCTION INTERNAL FIXATION (ORIF) MANDIBULAR FRACTURE, Ear Laceration Repair;  Surgeon: Newman Pies, MD;  Location: [facility] OR;  Service: ENT;  Laterality: Right;   No Known Allergies Current Outpatient Medications on File Prior to Visit  Medication Sig Dispense Refill   amLODipine (NORVASC) 10 MG tablet Take 1 tablet (10 mg total) by mouth daily. 90 tablet 0   carvedilol  (COREG) 25 MG tablet Take 1 tablet (25 mg total) by mouth 2 (two) times daily with a meal. 180 tablet 0   hydrALAZINE (APRESOLINE) 50 MG tablet Take 1 tablet (50 mg total) by mouth 3 (three) times daily. 90 tablet 1   ibuprofen (ADVIL) 800 MG tablet Take 1 tablet (800 mg total) by mouth every 8 (eight) hours as needed. 90 tablet 0   polyethylene glycol (MIRALAX / GLYCOLAX) 17 g packet Take 17 g by mouth daily as needed for mild constipation. (Patient not taking: Reported on 04/07/2023) 14 each 0   No current facility-administered medications on file prior to visit.   Social History   Socioeconomic History   Marital status: Single    Spouse name: Not on file   Number of children: Not on file   Years of education: Not on file   Highest education level: Not on file  Occupational History   Not on file  Tobacco Use   Smoking status: Every Day    Current packs/day: 0.50    Average packs/day: 0.5 packs/day for 20.0 years (10.0 ttl pk-yrs)    Types: Cigarettes   Smokeless tobacco: Never  Vaping Use   Vaping status: Never Used  Substance and Sexual Activity   Alcohol use: Yes    Comment: 12/26/16- 1 fifth a week   Drug use: No   Sexual activity: Yes  Other Topics Concern   Not on file  Social History Narrative   ** Merged History Encounter **       Social Drivers of Corporate investment banker Strain: Not on file  Food Insecurity: No Food Insecurity (  04/07/2023)   Hunger Vital Sign    Worried About Running Out of Food in the Last Year: Never true    Ran Out of Food in the Last Year: Never true  Transportation Needs: No Transportation Needs (04/07/2023)   PRAPARE - Administrator, Civil Service (Medical): No    Lack of Transportation (Non-Medical): No  Physical Activity: Not on file  Stress: Not on file  Social Connections: Not on file  Intimate Partner Violence: Not At Risk (04/07/2023)   Humiliation, Afraid, Rape, and Kick questionnaire    Fear of Current or  Ex-Partner: No    Emotionally Abused: No    Physically Abused: No    Sexually Abused: No   No family history on file. Health Maintenance  Topic Date Due   Pneumococcal Vaccine 65-107 Years old (1 of 2 - PCV) Never done   INFLUENZA VACCINE  Never done   COVID-19 Vaccine (1 - 2024-25 season) Never done   DTaP/Tdap/Td (5 - Td or Tdap) 08/11/2029   Hepatitis C Screening  Completed   HIV Screening  Completed   HPV VACCINES  Aged Out     OBJECTIVE:    Physical Exam Vitals reviewed.  Constitutional:      Appearance: He is obese.  HENT:     Right Ear: External ear normal.     Left Ear: External ear normal.  Eyes:     Extraocular Movements: Extraocular movements intact.  Cardiovascular:     Rate and Rhythm: Normal rate and regular rhythm.  Pulmonary:     Effort: Pulmonary effort is normal.     Breath sounds: Normal breath sounds.  Abdominal:     General: Bowel sounds are normal. There is distension.     Palpations: Abdomen is soft.  Musculoskeletal:        General: Normal range of motion.  Skin:    General: Skin is warm and dry.  Neurological:     Mental Status: He is oriented to person, place, and time.  Psychiatric:        Mood and Affect: Mood normal.        Behavior: Behavior normal.      ROS Comprehensive ROS Pertinent positive and negative noted in HPI   Last 3 Office BP readings: BP Readings from Last 3 Encounters:  04/16/23 (!) 154/92  04/14/23 (!) 157/85  04/08/23 136/74    BMET    Component Value Date/Time   NA 138 04/14/2023 0730   K 3.0 (L) 04/14/2023 0730   CL 98 04/14/2023 0730   CO2 33 (H) 04/14/2023 0730   GLUCOSE 99 04/14/2023 0730   BUN 20 04/14/2023 0730   CREATININE 1.35 (H) 04/14/2023 0730   CALCIUM 8.4 (L) 04/14/2023 0730   GFRNONAA >60 04/14/2023 0730   GFRAA >60 08/12/2019 2123    Renal function: CrCl cannot be calculated (Patient's most recent lab result is older than the maximum 21 days allowed.).  Clinical ASCVD: No  The  ASCVD Risk score (Arnett DK, et al., 2019) failed to calculate for the following reasons:   The 2019 ASCVD risk score is only valid for ages 103 to 50  ASCVD risk factors include- [place]   ASSESSMENT & PLAN: Diagnoses and all orders for this visit:  Lymphedema Lymphedema clinic   Prediabetes -     POCT glycosylated hemoglobin (Hb A1C)  OSA (obstructive sleep apnea) -     Ambulatory referral to Pulmonology   Essential hypertension -Counseled on lifestyle modifications  for blood pressure control including reduced dietary sodium, increased exercise, weight reduction and adequate sleep. Also, educated patient about the risk for cardiovascular events, stroke and heart attack. Also counseled patient about the importance of medication adherence. If you participate in smoking, it is important to stop using tobacco as this will increase the risks associated with uncontrolled blood pressure.  Goal BP:  For patients younger than 60: Goal BP < 130/80. For patients 60 and older: Goal BP < 140/90. For patients with diabetes: Goal BP < 130/80. Your most recent BP: 130/88  Minimize salt intake. Minimize alcohol intake    This note has been created with Education officer, environmental. Any transcriptional errors are unintentional.   Grayce Sessions, NP 05/05/2023, 8:52 AM

## 2023-05-05 NOTE — Telephone Encounter (Signed)
 Copied from CRM [id number]. Topic: General - Other >> May 05, 2023  8:05 AM Gaetano Hawthorne wrote: Reason for CRM: Dr. Randa Evens office is following up on the medical clearance form that they sent over a few times regarding his teeth extraction and proposed anesthesia. Please see scanned fax in the Media tab from 04/21/2023. If the provider has any questions, they may reach out to [phone].

## 2023-05-19 ENCOUNTER — Encounter (INDEPENDENT_AMBULATORY_CARE_PROVIDER_SITE_OTHER): Payer: Self-pay | Admitting: Primary Care

## 2023-05-21 ENCOUNTER — Encounter (HOSPITAL_COMMUNITY): Payer: Self-pay

## 2023-05-21 ENCOUNTER — Other Ambulatory Visit: Payer: Self-pay

## 2023-05-21 ENCOUNTER — Emergency Department (HOSPITAL_COMMUNITY)
Admission: EM | Admit: 2023-05-21 | Discharge: 2023-05-21 | Disposition: A | Discharge status: Home / Self Care | Attending: Emergency Medicine | Admitting: Emergency Medicine

## 2023-05-21 DIAGNOSIS — I890 Lymphedema, not elsewhere classified: Secondary | ICD-10-CM | POA: Diagnosis not present

## 2023-05-21 DIAGNOSIS — M79605 Pain in left leg: Secondary | ICD-10-CM | POA: Diagnosis present

## 2023-05-21 LAB — RAPID URINE DRUG SCREEN, HOSP PERFORMED
Amphetamines: NOT DETECTED
Barbiturates: NOT DETECTED
Benzodiazepines: NOT DETECTED
Cocaine: POSITIVE — AB
Opiates: NOT DETECTED
Tetrahydrocannabinol: NOT DETECTED

## 2023-05-21 LAB — URINALYSIS, ROUTINE W REFLEX MICROSCOPIC
Bilirubin Urine: NEGATIVE
Glucose, UA: NEGATIVE mg/dL
Hgb urine dipstick: NEGATIVE
Ketones, ur: NEGATIVE mg/dL
Leukocytes,Ua: NEGATIVE
Nitrite: NEGATIVE
Protein, ur: 100 mg/dL — AB
Specific Gravity, Urine: 1.024 (ref 1.005–1.030)
pH: 6 (ref 5.0–8.0)

## 2023-05-21 LAB — CBC
HCT: 38 % — ABNORMAL LOW (ref 39.0–52.0)
Hemoglobin: 10.9 g/dL — ABNORMAL LOW (ref 13.0–17.0)
MCH: 23.9 pg — ABNORMAL LOW (ref 26.0–34.0)
MCHC: 28.7 g/dL — ABNORMAL LOW (ref 30.0–36.0)
MCV: 83.3 fL (ref 80.0–100.0)
Platelets: 237 10*3/uL (ref 150–400)
RBC: 4.56 MIL/uL (ref 4.22–5.81)
RDW: 18.1 % — ABNORMAL HIGH (ref 11.5–15.5)
WBC: 8 10*3/uL (ref 4.0–10.5)
nRBC: 0 % (ref 0.0–0.2)

## 2023-05-21 LAB — BASIC METABOLIC PANEL WITH GFR
Anion gap: 9 (ref 5–15)
BUN: 18 mg/dL (ref 6–20)
CO2: 30 mmol/L (ref 22–32)
Calcium: 8.3 mg/dL — ABNORMAL LOW (ref 8.9–10.3)
Chloride: 95 mmol/L — ABNORMAL LOW (ref 98–111)
Creatinine, Ser: 1.32 mg/dL — ABNORMAL HIGH (ref 0.61–1.24)
GFR, Estimated: >60 mL/min (ref >60)
Glucose, Bld: 108 mg/dL — ABNORMAL HIGH (ref 70–99)
Potassium: 2.9 mmol/L — ABNORMAL LOW (ref 3.5–5.1)
Sodium: 134 mmol/L — ABNORMAL LOW (ref 135–145)

## 2023-05-21 LAB — CBG MONITORING, ED: Glucose-Capillary: 114 mg/dL — ABNORMAL HIGH (ref 70–99)

## 2023-05-21 MED ORDER — HYDRALAZINE HCL 25 MG PO TABS
50.0000 mg | ORAL_TABLET | Freq: Once | ORAL | Status: AC
  Administered 2023-05-21: 50 mg via ORAL
  Filled 2023-05-21: qty 2

## 2023-05-21 MED ORDER — AMLODIPINE BESYLATE 5 MG PO TABS
10.0000 mg | ORAL_TABLET | Freq: Once | ORAL | Status: AC
  Administered 2023-05-21: 10 mg via ORAL
  Filled 2023-05-21: qty 2

## 2023-05-21 NOTE — ED Provider Notes (Signed)
 Patient is a 33 year old male, received handoff from [place], [place], patient extremely somnolent, believed to have been coming off of cocaine bender, patient is medically cleared, will further watch, to ensure that blood pressure goes down, as well as patient regains consciousness appropriately.  Physical Exam  BP (!) 177/99 (BP Location: Left Arm)   Pulse 72   Temp (!) 97.4 F (36.3 C) (Oral)   Resp 17   Ht 6' (1.829 m)   Wt (!) 244.9 kg   SpO2 97%   BMI 73.24 kg/m   Physical Exam Vitals and nursing note reviewed.  Constitutional:      General: He is not in acute distress.    Appearance: He is well-developed.  HENT:     Head: Normocephalic and atraumatic.  Eyes:     Conjunctiva/sclera: Conjunctivae normal.  Cardiovascular:     Rate and Rhythm: Normal rate and regular rhythm.     Heart sounds: No murmur heard. Pulmonary:     Effort: Pulmonary effort is normal. No respiratory distress.     Breath sounds: Normal breath sounds.  Abdominal:     Palpations: Abdomen is soft.     Tenderness: There is no abdominal tenderness.  Musculoskeletal:        General: No swelling.     Cervical back: Neck supple.     Right lower leg: Edema present.     Left lower leg: Edema present.  Skin:    General: Skin is warm and dry.     Capillary Refill: Capillary refill takes less than 2 seconds.  Neurological:     Mental Status: He is alert.  Psychiatric:        Mood and Affect: Mood normal.     Procedures  Procedures  ED Course / MDM    Medical Decision Making Lymphadema present, without any erythema, warmth.  Overall well-appearing, has woken up, and has is family at bedside, to pick him up.  He began getting irate, that I would not prescribe him narcotics, or others, I recommended ibuprofen which he declined.  He stated "shut the fuck up" and "get the fuck out of here" when discussing lymphedema being a chronic condition, and that I would be unable to provide him with narcotics given this is  a chronic condition and he tested positive for cocaine.  I recommended that he follow-up with the lymphedema clinic, as noted above, and recommended wrapping, for his legs.  I had the nurse wrapped his legs.  I believe that his increased somnolence was likely secondary to cocaine use.  He was quite aggressive with provider, attempted de-escalate, however had to end up leaving the room, given the patient's verbal abuse. He has no particular calf ttp, just generalized pain of the legs.   Amount and/or Complexity of Data Reviewed Labs: ordered.  Risk Prescription drug management.          Pete Pelt, [place] 05/21/23 1117    Margarita Grizzle, MD 05/22/23 1135

## 2023-05-21 NOTE — ED Notes (Signed)
 This RN to room to give medication for BP. Pt difficult to arouse despite calling name and patting on shoulder. Pt eventually opened eyes, sat up in bed, and agreed to take medication. Pt went back to sleep immediately after taking medication.

## 2023-05-21 NOTE — ED Notes (Signed)
 Pt ambulated hallway w/ pulse oximetry w/o supplement O2 per PA. Pt's O2 stayed between 92-95% while ambulating on RA and HR between 80-85. Pt denied SOB.

## 2023-05-21 NOTE — Discharge Instructions (Addendum)
 Your leg presentation is consistent with your underlying lymphedema.  Please continue to use your lymphedema pumps and compression at home if you are able.  Elevate your extremities when resting.  Please follow-up with your orthopedic surgeon for further evaluation as needed.

## 2023-05-21 NOTE — ED Provider Notes (Signed)
 [place] EMERGENCY DEPARTMENT AT [facility] Provider Note   CSN: [id number] Arrival date & time: 05/21/23  4098     History  Chief Complaint  Patient presents with   Leg Pain   Fatigue    Jared Mccoy is a 33 y.o. adult.  Patient presents to the emergency room complaining of bilateral leg pain.  Patient with history of lymphedema in bilateral lower extremities, followed by Dr. Lajoyce Corners.  Patient uses pumps for lymphedema at home.  Recommendations have not made in the past for increased aerobic exercise, diet, and orthopedic follow-up.  Patient complaining of increased pain from baseline tonight.  Pain is reportedly equal in bilateral lower extremities.  He denies shortness of breath, abdominal pain, nausea, vomiting, chest pain.  Patient is falling asleep during the triage process.  Upon awakening patient states "it is late" and goes back to sleep.   Leg Pain      Home Medications Prior to Admission medications   Medication Sig Start Date End Date Taking? Authorizing Provider  amLODipine (NORVASC) 10 MG tablet Take 1 tablet (10 mg total) by mouth daily. 04/16/23   Grayce Sessions, NP  carvedilol (COREG) 25 MG tablet Take 1 tablet (25 mg total) by mouth 2 (two) times daily with a meal. 04/16/23   Grayce Sessions, NP  hydrALAZINE (APRESOLINE) 50 MG tablet Take 1 tablet (50 mg total) by mouth 3 (three) times daily. 04/16/23   Grayce Sessions, NP  ibuprofen (ADVIL) 800 MG tablet Take 1 tablet (800 mg total) by mouth every 8 (eight) hours as needed. 04/16/23   Grayce Sessions, NP  polyethylene glycol (MIRALAX / GLYCOLAX) 17 g packet Take 17 g by mouth daily as needed for mild constipation. Patient not taking: Reported on 04/07/2023 08/12/22   Merlene Laughter, DO      Allergies    Patient has no known allergies.    Review of Systems   Review of Systems  Physical Exam Updated Vital Signs BP (!) 218/125   Pulse 71   Temp 98.2 F (36.8 C) (Oral)   Resp (!)  22   Ht 6' (1.829 m)   Wt (!) 244.9 kg   SpO2 98%   BMI 73.24 kg/m  Physical Exam Vitals and nursing note reviewed.  HENT:     Head: Normocephalic and atraumatic.  Eyes:     Conjunctiva/sclera: Conjunctivae normal.  Cardiovascular:     Rate and Rhythm: Normal rate and regular rhythm.  Pulmonary:     Effort: Pulmonary effort is normal. No respiratory distress.     Breath sounds: Normal breath sounds.  Abdominal:     Palpations: Abdomen is soft.     Tenderness: There is no abdominal tenderness.  Musculoskeletal:        General: No signs of injury.     Cervical back: Normal range of motion.     Right lower leg: Edema present.     Left lower leg: Edema present.     Comments: Significant swelling with no pitting edema noted to bilateral lower extremity.  Generalized weeping noted.  No open wounds appreciated.  No excess erythema.  Skin:    General: Skin is dry.  Neurological:     Mental Status: He is alert.  Psychiatric:        Speech: Speech normal.        Behavior: Behavior normal.     ED Results / Procedures / Treatments   Labs (all labs ordered  are listed, but only abnormal results are displayed) Labs Reviewed  BASIC METABOLIC PANEL WITH GFR - Abnormal; Notable for the following components:      Result Value   Sodium 134 (*)    Potassium 2.9 (*)    Chloride 95 (*)    Glucose, Bld 108 (*)    Creatinine, Ser 1.32 (*)    Calcium 8.3 (*)    All other components within normal limits  CBC - Abnormal; Notable for the following components:   Hemoglobin 10.9 (*)    HCT 38.0 (*)    MCH 23.9 (*)    MCHC 28.7 (*)    RDW 18.1 (*)    All other components within normal limits  URINALYSIS, ROUTINE W REFLEX MICROSCOPIC - Abnormal; Notable for the following components:   Protein, ur 100 (*)    Bacteria, UA RARE (*)    All other components within normal limits  RAPID URINE DRUG SCREEN, HOSP PERFORMED - Abnormal; Notable for the following components:   Cocaine POSITIVE (*)     All other components within normal limits  CBG MONITORING, ED - Abnormal; Notable for the following components:   Glucose-Capillary 114 (*)    All other components within normal limits    EKG None  Radiology No results found.  Procedures Procedures    Medications Ordered in ED Medications  amLODipine (NORVASC) tablet 10 mg (10 mg Oral Given 05/21/23 0610)  hydrALAZINE (APRESOLINE) tablet 50 mg (50 mg Oral Given 05/21/23 0610)    ED Course/ Medical Decision Making/ A&P                                 Medical Decision Making Amount and/or Complexity of Data Reviewed Labs: ordered.   This patient presents to the ED for concern of bilateral leg pain, this involves an extensive number of treatment options, and is a complaint that carries with it a high risk of complications and morbidity.  The differential diagnosis includes lymphedema, cellulitis, others   Co morbidities that complicate the patient evaluation  Lymphedema   Additional history obtained:   External records from outside source obtained and reviewed including notes from orthopedic surgery recommending follow-up in office, continued use of pumps for lymphedema   Lab Tests:  I Ordered, and personally interpreted labs.  The pertinent results include: UDS positive for cocaine.  Potassium 2.9 consistent with baseline that appears to be 3   Cardiac Monitoring: / EKG:  The patient was maintained on a cardiac monitor.  I personally viewed and interpreted the cardiac monitored which showed an underlying rhythm of: Sinus rhythm   Problem List / ED Course / Critical interventions / Medication management   I ordered medication including amlodipine and hydralazine for hypertension Reevaluation of the patient after these medicines showed that the patient improved I have reviewed the patients home medicines and have made adjustments as needed   Social Determinants of Health:  Patient is a daily tobacco  smoker   Test / Admission - Considered:  Patient with bilateral lower leg presentation consistent with known lymphedema.  No sign of cellulitis at this time.  No unilateral presentation to raise concerns for DVT at this time.  Patient has been followed by orthopedic surgery for the same complaint.  Will recommend patient continue to follow-up with their office for further recommendations as needed. Patient very somnolent during encounter.  Patient denied any drug use.  UDS positive  for cocaine.  Question if patient may be very somnolent secondary to earlier cocaine use but unclear.  Patient is arousable and able to ambulate without difficulty.  Patient maintains SpO2 greater than 95% during ambulation.  No indication for further emergent workup.         Final Clinical Impression(s) / ED Diagnoses Final diagnoses:  Lymphedema    Rx / DC Orders ED Discharge Orders     None         Pamala Duffel 05/21/23 9604    Nira Conn, MD 05/21/23 2007

## 2023-05-21 NOTE — ED Triage Notes (Signed)
 Pt reports bilateral leg pain starting 2 hrs ago. Pt reports always having pain but that its worse than usual. Bilateral swelling noted in legs, no worse than usual per patient. Pt lethargic in triage and falling asleep.

## 2023-06-02 ENCOUNTER — Other Ambulatory Visit: Payer: Self-pay

## 2023-06-02 ENCOUNTER — Telehealth: Payer: Self-pay

## 2023-06-02 ENCOUNTER — Other Ambulatory Visit (INDEPENDENT_AMBULATORY_CARE_PROVIDER_SITE_OTHER): Payer: Self-pay | Admitting: Primary Care

## 2023-06-02 NOTE — Telephone Encounter (Signed)
 Called and lm on vm for follow up office visit from ER 05/21/2023 per Dr. Julio Ohm will hold this message and try to call again later.

## 2023-06-03 ENCOUNTER — Other Ambulatory Visit: Payer: Self-pay

## 2023-06-03 MED ORDER — IBUPROFEN 800 MG PO TABS
800.0000 mg | ORAL_TABLET | Freq: Three times a day (TID) | ORAL | 0 refills | Status: DC | PRN
  Filled 2023-06-03: qty 90, 30d supply, fill #0

## 2023-06-03 NOTE — Telephone Encounter (Signed)
 Requested Prescriptions  Pending Prescriptions Disp Refills   ibuprofen  (ADVIL ) 800 MG tablet 90 tablet 0    Sig: Take 1 tablet (800 mg total) by mouth every 8 (eight) hours as needed.     Analgesics:  NSAIDS Failed - 06/03/2023 12:54 PM      Failed - Manual Review: Labs are only required if the patient has taken medication for more than 8 weeks.      Failed - Cr in normal range and within 360 days    Creatinine, Ser  Date Value Ref Range Status  05/21/2023 1.32 (H) 0.61 - 1.24 mg/dL Final         Failed - HGB in normal range and within 360 days    Hemoglobin  Date Value Ref Range Status  05/21/2023 10.9 (L) 13.0 - 17.0 g/dL Final         Failed - HCT in normal range and within 360 days    HCT  Date Value Ref Range Status  05/21/2023 38.0 (L) 39.0 - 52.0 % Final         Failed - Valid encounter within last 12 months    Recent Outpatient Visits           1 month ago Hospital discharge follow-up   [place] [facility] Marius Siemens, NP   1 year ago Healthcare maintenance   [place] [facility] Marius Siemens, NP   1 year ago Screening for diabetes mellitus   [place] [facility] Marius Siemens, NP   2 years ago Screening for diabetes mellitus   [place] [facility] Marius Siemens, NP       Future Appointments             In 1 month Edwards, Meade Spencer, NP  [facility]            Passed - PLT in normal range and within 360 days    Platelets  Date Value Ref Range Status  05/21/2023 237 150 - 400 K/uL Final         Passed - eGFR is 30 or above and within 360 days    GFR calc Af Amer  Date Value Ref Range Status  08/12/2019 >60 >60 mL/min Final   GFR, Estimated  Date Value Ref Range Status  05/21/2023 >60 >60 mL/min Final    Comment:    (NOTE) Calculated using the CKD-EPI Creatinine Equation (2021)          Passed - Patient is not  pregnant

## 2023-06-04 ENCOUNTER — Other Ambulatory Visit: Payer: Self-pay

## 2023-06-05 ENCOUNTER — Other Ambulatory Visit: Payer: Self-pay

## 2023-06-05 NOTE — Telephone Encounter (Signed)
 Pt has an appt 06/17/2023

## 2023-06-10 ENCOUNTER — Other Ambulatory Visit: Payer: Self-pay

## 2023-06-11 ENCOUNTER — Other Ambulatory Visit: Payer: Self-pay

## 2023-06-12 ENCOUNTER — Telehealth (INDEPENDENT_AMBULATORY_CARE_PROVIDER_SITE_OTHER): Payer: Self-pay

## 2023-06-12 NOTE — Telephone Encounter (Signed)
 Also received fax from [facility] regarding pcs services. Pt will need to discuss with provider as well for documentation.

## 2023-06-12 NOTE — Telephone Encounter (Signed)
 Received a fax from Dr. Juleen Oakland in regards to pt bp and medical clearance for oral surgey.  Pt bp that was reported was 219/117. Tried reaching out to pt to schedule an appt unfortunately call was not able to be completed  If pt calls back please schedule an appt   Kellen Try reaching pt to schedule an appt

## 2023-06-17 ENCOUNTER — Ambulatory Visit: Admitting: Orthopedic Surgery

## 2023-06-18 ENCOUNTER — Ambulatory Visit: Admitting: Orthopedic Surgery

## 2023-06-19 ENCOUNTER — Other Ambulatory Visit: Payer: Self-pay

## 2023-06-19 NOTE — Telephone Encounter (Signed)
 Will have provider sign form and will send [name] the form

## 2023-06-20 NOTE — Telephone Encounter (Signed)
 Noted.

## 2023-06-23 ENCOUNTER — Ambulatory Visit: Payer: Self-pay

## 2023-06-23 ENCOUNTER — Encounter (INDEPENDENT_AMBULATORY_CARE_PROVIDER_SITE_OTHER): Payer: Self-pay | Admitting: Primary Care

## 2023-06-23 NOTE — Telephone Encounter (Signed)
 This RN attempted to contact patient. No answer. LVM. Will route to office for further follow up.

## 2023-06-23 NOTE — Telephone Encounter (Signed)
 noted

## 2023-06-23 NOTE — Telephone Encounter (Signed)
 Copied from CRM [id number]. Topic: Clinical - Red Word Triage >> Jun 23, 2023  2:39 PM Rennis Case wrote: Red Word that prompted transfer to Nurse Triage: Sister calling on behalf of pt, sister not on DPR, advised would have to speak to patient directly. Sister attempted to get patient on the phone unsuccessfully. Sister will try to call back w/ patient on phone.    patient in pain, has lymphedema in legs, open wounds - looks infected and has an foul odor and leaking

## 2023-06-24 DIAGNOSIS — F1411 Cocaine abuse, in remission: Secondary | ICD-10-CM | POA: Diagnosis not present

## 2023-06-24 DIAGNOSIS — L97229 Non-pressure chronic ulcer of left calf with unspecified severity: Secondary | ICD-10-CM | POA: Diagnosis not present

## 2023-06-24 DIAGNOSIS — Z6841 Body Mass Index (BMI) 40.0 and over, adult: Secondary | ICD-10-CM | POA: Diagnosis not present

## 2023-06-24 DIAGNOSIS — I509 Heart failure, unspecified: Secondary | ICD-10-CM | POA: Diagnosis not present

## 2023-06-24 DIAGNOSIS — L97219 Non-pressure chronic ulcer of right calf with unspecified severity: Secondary | ICD-10-CM | POA: Diagnosis not present

## 2023-06-24 DIAGNOSIS — F17210 Nicotine dependence, cigarettes, uncomplicated: Secondary | ICD-10-CM | POA: Diagnosis not present

## 2023-06-24 DIAGNOSIS — R7303 Prediabetes: Secondary | ICD-10-CM | POA: Diagnosis not present

## 2023-06-24 DIAGNOSIS — Z008 Encounter for other general examination: Secondary | ICD-10-CM | POA: Diagnosis not present

## 2023-06-24 DIAGNOSIS — I110 Hypertensive heart disease with heart failure: Secondary | ICD-10-CM | POA: Diagnosis not present

## 2023-06-26 ENCOUNTER — Telehealth: Payer: Self-pay

## 2023-06-26 NOTE — Telephone Encounter (Signed)
 Copied from CRM [id number]. Topic: General - Other >> Jun 24, 2023 12:34 PM Felizardo Hotter wrote: Reason for CRM: Received call from Dr. Carman Chimera office per Guin ph:[phone] , pt needs medical clearance for surgery. Paper work was faxed on please complete and return. Guin needs to know about pt's blood pressure. Please call.

## 2023-06-26 NOTE — Telephone Encounter (Signed)
 PCS form Faxed confirmation received.

## 2023-06-27 NOTE — Telephone Encounter (Signed)
 Please refer to telephone encounter 06/12/23

## 2023-06-27 NOTE — Telephone Encounter (Signed)
 Pt has an appt with Moira Andrews on 07/08/23

## 2023-06-29 ENCOUNTER — Emergency Department (HOSPITAL_COMMUNITY)

## 2023-06-29 ENCOUNTER — Other Ambulatory Visit: Payer: Self-pay

## 2023-06-29 ENCOUNTER — Emergency Department (HOSPITAL_COMMUNITY)
Admission: EM | Admit: 2023-06-29 | Discharge: 2023-06-29 | Disposition: A | Discharge status: Home / Self Care | Attending: Emergency Medicine | Admitting: Emergency Medicine

## 2023-06-29 DIAGNOSIS — R0602 Shortness of breath: Secondary | ICD-10-CM | POA: Insufficient documentation

## 2023-06-29 DIAGNOSIS — R059 Cough, unspecified: Secondary | ICD-10-CM | POA: Diagnosis not present

## 2023-06-29 DIAGNOSIS — I10 Essential (primary) hypertension: Secondary | ICD-10-CM | POA: Diagnosis not present

## 2023-06-29 DIAGNOSIS — R079 Chest pain, unspecified: Secondary | ICD-10-CM | POA: Insufficient documentation

## 2023-06-29 DIAGNOSIS — R0600 Dyspnea, unspecified: Secondary | ICD-10-CM | POA: Diagnosis not present

## 2023-06-29 DIAGNOSIS — I517 Cardiomegaly: Secondary | ICD-10-CM | POA: Diagnosis not present

## 2023-06-29 LAB — TROPONIN I (HIGH SENSITIVITY)
Troponin I (High Sensitivity): 20 ng/L — ABNORMAL HIGH (ref <18)
Troponin I (High Sensitivity): 18 ng/L — ABNORMAL HIGH (ref <18)

## 2023-06-29 LAB — BASIC METABOLIC PANEL WITH GFR
Anion gap: 8 (ref 5–15)
BUN: 17 mg/dL (ref 6–20)
CO2: 29 mmol/L (ref 22–32)
Calcium: 8.5 mg/dL — ABNORMAL LOW (ref 8.9–10.3)
Chloride: 100 mmol/L (ref 98–111)
Creatinine, Ser: 1.36 mg/dL — ABNORMAL HIGH (ref 0.61–1.24)
GFR, Estimated: >60 mL/min (ref >60)
Glucose, Bld: 103 mg/dL — ABNORMAL HIGH (ref 70–99)
Potassium: 2.9 mmol/L — ABNORMAL LOW (ref 3.5–5.1)
Sodium: 137 mmol/L (ref 135–145)

## 2023-06-29 LAB — BLOOD GAS, VENOUS
Acid-Base Excess: 8.5 mmol/L — ABNORMAL HIGH (ref 0.0–2.0)
Bicarbonate: 35.3 mmol/L — ABNORMAL HIGH (ref 20.0–28.0)
O2 Saturation: 89.8 %
Patient temperature: 37
pCO2, Ven: 57 mmHg (ref 44–60)
pH, Ven: 7.4 (ref 7.25–7.43)
pO2, Ven: 56 mmHg — ABNORMAL HIGH (ref 32–45)

## 2023-06-29 LAB — CBC
HCT: 36.4 % — ABNORMAL LOW (ref 39.0–52.0)
Hemoglobin: 10.9 g/dL — ABNORMAL LOW (ref 13.0–17.0)
MCH: 23.9 pg — ABNORMAL LOW (ref 26.0–34.0)
MCHC: 29.9 g/dL — ABNORMAL LOW (ref 30.0–36.0)
MCV: 79.6 fL — ABNORMAL LOW (ref 80.0–100.0)
Platelets: 271 10*3/uL (ref 150–400)
RBC: 4.57 MIL/uL (ref 4.22–5.81)
RDW: 18.3 % — ABNORMAL HIGH (ref 11.5–15.5)
WBC: 8 10*3/uL (ref 4.0–10.5)
nRBC: 0 % (ref 0.0–0.2)

## 2023-06-29 LAB — BRAIN NATRIURETIC PEPTIDE: B Natriuretic Peptide: 37.7 pg/mL (ref 0.0–100.0)

## 2023-06-29 MED ORDER — POTASSIUM CHLORIDE CRYS ER 20 MEQ PO TBCR
40.0000 meq | EXTENDED_RELEASE_TABLET | Freq: Once | ORAL | Status: AC
  Administered 2023-06-29: 40 meq via ORAL
  Filled 2023-06-29: qty 2

## 2023-06-29 NOTE — ED Triage Notes (Signed)
 PT BIB EMS coming from home c/o Shob and chest pain that started tonight. Pain 8/10, Has peripheral edema on legs, states having 2 open wounds on each leg that have been draining.   EMS:  BP 154/70, HR 74, Spo2 95%, EKG prolonged QT, DuoNeb given en route

## 2023-06-29 NOTE — Discharge Instructions (Signed)
 Your tests look good tonight.  Please follow-up with your regular doctor.  Please try and limit salt intake.  Return for new or worsening symptoms.

## 2023-06-29 NOTE — ED Provider Notes (Signed)
 [facility] Emergency Department Provider Note MRN:  [id number]  Arrival date & time: 06/29/23     Chief Complaint   Chest Pain and Shortness of Breath   History of Present Illness   Jared Mccoy is a 33 y.o. year-old adult presents to the ED with chief complaint of shortness of breath.  States that he has had increased swelling.  States that he had stopped his Lasix .  Reports that his symptoms are worse when he lays down.  He denies fever, chills, or productive cough.  Patient has difficulty remaining awake during my history.  History provided by patient.   Review of Systems  Pertinent positive and negative review of systems noted in HPI.    Physical Exam   Vitals:   06/29/23 0128 06/29/23 0438  BP:  136/72  Pulse:  63  Resp:  17  Temp:    SpO2: 96% 95%    CONSTITUTIONAL:  somnolent-appearing, NAD NEURO:  Alert and oriented x 3, CN 3-12 grossly intact EYES:  eyes equal and reactive ENT/NECK:  Supple, no stridor  CARDIO:  normal rate, regular rhythm, appears well-perfused  PULM:  No respiratory distress, diminished, no wheezes GI/GU:  non-distended,  MSK/SPINE:  No gross deformities, no edema, moves all extremities  SKIN:  no rash, atraumatic   *Additional and/or pertinent findings included in MDM below  Diagnostic and Interventional Summary    EKG Interpretation Date/Time:  Sunday Jun 29 2023 01:21:22 EDT Ventricular Rate:  77 PR Interval:  146 QRS Duration:  116 QT Interval:  456 QTC Calculation: 517 R Axis:   86  Text Interpretation: Sinus rhythm Nonspecific intraventricular conduction delay No significant change was found Confirmed by Townsend Freud [id number]) on 06/29/2023 2:05:29 AM       Labs Reviewed  BASIC METABOLIC PANEL WITH GFR - Abnormal; Notable for the following components:      Result Value   Potassium 2.9 (*)    Glucose, Bld 103 (*)    Creatinine, Ser 1.36 (*)    Calcium 8.5 (*)    All other components within  normal limits  CBC - Abnormal; Notable for the following components:   Hemoglobin 10.9 (*)    HCT 36.4 (*)    MCV 79.6 (*)    MCH 23.9 (*)    MCHC 29.9 (*)    RDW 18.3 (*)    All other components within normal limits  BLOOD GAS, VENOUS - Abnormal; Notable for the following components:   pO2, Ven 56 (*)    Bicarbonate 35.3 (*)    Acid-Base Excess 8.5 (*)    All other components within normal limits  TROPONIN I (HIGH SENSITIVITY) - Abnormal; Notable for the following components:   Troponin I (High Sensitivity) 20 (*)    All other components within normal limits  TROPONIN I (HIGH SENSITIVITY) - Abnormal; Notable for the following components:   Troponin I (High Sensitivity) 18 (*)    All other components within normal limits  BRAIN NATRIURETIC PEPTIDE  RAPID URINE DRUG SCREEN, HOSP PERFORMED    DG Chest 2 View  Final Result      Medications  potassium chloride  SA (KLOR-CON  M) CR tablet 40 mEq (40 mEq Oral Given 06/29/23 0351)     Procedures  /  Critical Care Procedures  ED Course and Medical Decision Making  I have reviewed the triage vital signs, the nursing notes, and pertinent available records from the EMR.  Social Determinants Affecting Complexity of Care: Patient  has no clinically significant social determinants affecting this chief complaint..   ED Course:    Medical Decision Making Patient here with acute on chronic shortness of breath.  He reports associated chest pain.  He has stopped his Lasix  for unknown reason.  Will check labs and imaging.  BMI is quite high, this could be contributory.  Labs are reassuring.  Initial troponin is 20, repeat is 18.  Essentially flat.  Patient reassessed and is not having any chest pain.  He states that he feels improved.  Potassium is a little low at 5.9, this was supplemented with oral potassium.  No acute ischemic EKG changes.  Chest x-ray is without obvious effusion or opacity.  Feel that patient is stable for discharge  home.  Amount and/or Complexity of Data Reviewed Labs: ordered. Radiology: ordered.  Risk Prescription drug management.         Consultants: No consultations were needed in caring for this patient.   Treatment and Plan: I considered admission due to patient's initial presentation, but after considering the examination and diagnostic results, patient will not require admission and can be discharged with outpatient follow-up.  Patient discussed with attending physician, Dr. Morris Arch, who agrees with plan for discharge.  Final Clinical Impressions(s) / ED Diagnoses     ICD-10-CM   1. Shortness of breath  R06.02       ED Discharge Orders     None         Discharge Instructions Discussed with and Provided to Patient:     Discharge Instructions      Your tests look good tonight.  Please follow-up with your regular doctor.  Please try and limit salt intake.  Return for new or worsening symptoms.     Sherel Dikes, PA-C 06/29/23 8119    Lindle Rhea, MD 06/29/23 [phone]

## 2023-06-29 NOTE — ED Notes (Signed)
 Patient made aware needing urine sample

## 2023-06-29 NOTE — ED Notes (Signed)
 This RN to room to assess if pt had a way to get home. Pt stated they could contact their sister, but needed to charge their phone. Micah, EMT, placed pt's phone on charger at nurses station. Phone returned when charged enough to make a call. Pt stated sister was not answering so this RN offered alternate options such as bus pass. Pt asked for social worker and a cab voucher. Explained to pt that cab vouchers had to be approved by charge and a Child psychotherapist would not be at bedside until later in the morning. Pt stated "you are acting mad weird." This RN asked pt to elaborate and pt stated "you have a funky attitude." This RN informed charge of situation who helped pt to lobby and set up accommodations for a ride home.

## 2023-06-29 NOTE — ED Notes (Signed)
 Walked pt with pulse oximetry per PA down the hall and back. Pt O2 sats stayed between 91-95%. Pt denied chest pain, but had labored breathing. PA notified.

## 2023-07-08 ENCOUNTER — Telehealth (INDEPENDENT_AMBULATORY_CARE_PROVIDER_SITE_OTHER): Payer: Self-pay

## 2023-07-08 ENCOUNTER — Ambulatory Visit (INDEPENDENT_AMBULATORY_CARE_PROVIDER_SITE_OTHER): Admitting: Primary Care

## 2023-07-08 NOTE — Telephone Encounter (Signed)
 Form was received back on 06/12/23. Tried reaching out to pt then to schedule an appt. Was unable to get in touch with him.   Returned call to Dr. Ethridge Herder office and spoke with [facility] and informed her that we did received clearance form on 06/12/23 but because his bp was 219/117 provider stated she was not going to clear him because of his bp and he will need to be seen  Pt had an appt for today at 9:50 but pt no showed and made Ginny aware of that as well  No other questions or concerns

## 2023-07-08 NOTE — Telephone Encounter (Signed)
 Copied from CRM [id number]. Topic: General - Other >> Jun 24, 2023 12:34 PM Felizardo Hotter wrote: Reason for CRM: Received call from Dr. Carman Chimera office per Guin ph:[phone] , pt needs medical clearance for surgery. Paper work was faxed on please complete and return. Guin needs to know about pt's blood pressure. Please call. >> Jul 08, 2023 10:15 AM Emylou G wrote: Adv them we faxed over clearance on 5/15.. she said she never rcvd.. Please refax: [phone]

## 2023-07-09 ENCOUNTER — Encounter: Payer: Self-pay | Admitting: Nurse Practitioner

## 2023-07-09 ENCOUNTER — Encounter (HOSPITAL_BASED_OUTPATIENT_CLINIC_OR_DEPARTMENT_OTHER): Admitting: Internal Medicine

## 2023-07-09 ENCOUNTER — Telehealth (INDEPENDENT_AMBULATORY_CARE_PROVIDER_SITE_OTHER): Payer: Self-pay | Admitting: Primary Care

## 2023-07-09 ENCOUNTER — Ambulatory Visit: Admitting: Nurse Practitioner

## 2023-07-09 VITALS — BP 190/100 | HR 90 | Ht 72.0 in | Wt >= 6400 oz

## 2023-07-09 DIAGNOSIS — I10 Essential (primary) hypertension: Secondary | ICD-10-CM | POA: Diagnosis not present

## 2023-07-09 DIAGNOSIS — E66813 Obesity, class 3: Secondary | ICD-10-CM

## 2023-07-09 DIAGNOSIS — G4710 Hypersomnia, unspecified: Secondary | ICD-10-CM

## 2023-07-09 DIAGNOSIS — G4719 Other hypersomnia: Secondary | ICD-10-CM | POA: Insufficient documentation

## 2023-07-09 DIAGNOSIS — F17210 Nicotine dependence, cigarettes, uncomplicated: Secondary | ICD-10-CM | POA: Diagnosis not present

## 2023-07-09 DIAGNOSIS — R0681 Apnea, not elsewhere classified: Secondary | ICD-10-CM

## 2023-07-09 DIAGNOSIS — E662 Morbid (severe) obesity with alveolar hypoventilation: Secondary | ICD-10-CM | POA: Diagnosis not present

## 2023-07-09 DIAGNOSIS — R0683 Snoring: Secondary | ICD-10-CM | POA: Diagnosis not present

## 2023-07-09 DIAGNOSIS — Z6841 Body Mass Index (BMI) 40.0 and over, adult: Secondary | ICD-10-CM | POA: Diagnosis not present

## 2023-07-09 NOTE — Progress Notes (Signed)
 @Patient  ID: Jared Mccoy, adult    DOB: [birth year], 33 y.o.   MRN: [id number]  Chief Complaint  Patient presents with   Consult    History of sleep apnea and no CPAP machine     Referring provider: Marius Siemens, NP  HPI: 33 year old male, active smoker referred for sleep consult. Past medical history significant for HTN, influenza A.   TEST/EVENTS:   07/09/2023: Today - sleep consult Discussed the use of AI scribe software for clinical note transcription with the patient, who gave verbal consent to proceed.  History of Present Illness   DARRYEL DIODATO is a 33 year old male who presents with excessive daytime sleepiness and suspected sleep apnea.  He experiences significant daytime sleepiness, often falling asleep if he sits down for a short period. He describes episodes of apnea and gasping during sleep, and has been informed that he snores. Upon waking, he feels tired and experiences frequent nocturnal awakenings.  He admits to drowsy driving and has fallen asleep while driving, though he has not had any accidents. The last episode of drowsy driving occurred a couple of months ago. He denies any sleep parasomnias/paralysis. No hx of narcolepsy or cataplexy.   He is currently on disability and does not work. He smokes cigarettes daily and drinks alcohol socially but not daily. He does not regularly consume caffeine, only having a soda occasionally. He is not taking any sleep medications.  He did not take his blood pressure medication on the morning of the visit, as he woke up late. His blood pressure is high. No headaches, CP or vision changes. He does not routinely monitor it at home.   Goes to bed around 11 pm-1 am.  Falls asleep quickly.  Wakes up almost every hour.  Usually gets up around 5:30 AM.  Weight has been stable over the last 2 years.  Never had a previous sleep study before.  Does not use any supplemental oxygen.  Lives with his children and  mother.  Epworth 19      No Known Allergies  Immunization History  Administered Date(s) Administered   Tdap 02/12/2015, 11/10/2016, 07/13/2017, 08/12/2019    Past Medical History:  Diagnosis Date   Hypertension    not on medication   Obese    Peripheral vascular disease (HCC)    edema in legs     Tobacco History: Social History   Tobacco Use  Smoking Status Every Day   Current packs/day: 0.50   Average packs/day: 0.5 packs/day for 20.0 years (10.0 ttl pk-yrs)   Types: Cigarettes  Smokeless Tobacco Never  Tobacco Comments   I pack of cigarettes a day. 07/09/2023   Ready to quit: Not Answered Counseling given: Not Answered Tobacco comments: I pack of cigarettes a day. 07/09/2023   Outpatient Medications Prior to Visit  Medication Sig Dispense Refill   amLODipine  (NORVASC ) 10 MG tablet Take 1 tablet (10 mg total) by mouth daily. 90 tablet 0   carvedilol  (COREG ) 25 MG tablet Take 1 tablet (25 mg total) by mouth 2 (two) times daily with a meal. 180 tablet 0   hydrALAZINE  (APRESOLINE ) 50 MG tablet Take 1 tablet (50 mg total) by mouth 3 (three) times daily. 90 tablet 1   ibuprofen  (ADVIL ) 800 MG tablet Take 1 tablet (800 mg total) by mouth every 8 (eight) hours as needed. 90 tablet 0   polyethylene glycol (MIRALAX  / GLYCOLAX ) 17 g packet Take 17 g by mouth daily as  needed for mild constipation. (Patient not taking: Reported on 04/07/2023) 14 each 0   No facility-administered medications prior to visit.     Review of Systems:   Constitutional: No weight loss or gain, night sweats, fevers, chills, lassitude. +fatigue  HEENT: No headaches, difficulty swallowing, tooth/dental problems, or sore throat. No sneezing, itching, ear ache, nasal congestion, or post nasal drip CV:  +chronic swelling in lower extremities. No chest pain, orthopnea, PND, anasarca, dizziness, palpitations, syncope Resp: +snoring, witnessed apneas; baseline shortness of breath with exertion. No cough. No  hemoptysis. No wheezing.   GI:  No heartburn, indigestion GU: No nocturia  Skin: No rash, lesions, ulcerations MSK:  No joint pain or swelling.   Neuro: No dizziness or lightheadedness.  Psych: No depression or anxiety. Mood stable. +sleep disturbance    Physical Exam:  BP (!) 190/100 (BP Location: Right Arm, Patient Position: Sitting, Cuff Size: Large)   Pulse 90   Ht 6' (1.829 m)   Wt (!) 542 lb (245.8 kg)   SpO2 95%   BMI 73.51 kg/m   GEN: Pleasant, interactive; morbidly obese; in no acute distress HEENT:  Normocephalic and atraumatic. PERRLA. Sclera white. Nasal turbinates pink, moist and patent bilaterally. No rhinorrhea present. Oropharynx pink and moist, without exudate or edema. No lesions, ulcerations, or postnasal drip. Enlarged tonsils. Mallampati III NECK:  Supple w/ fair ROM. Thyroid symmetrical with no goiter or nodules palpated. No lymphadenopathy.   CV: RRR, no m/r/g, +1 BLE edema. Pulses intact, +2 bilaterally. No cyanosis, pallor or clubbing. PULMONARY:  Unlabored, regular breathing. Clear bilaterally A&P w/o wheezes/rales/rhonchi. No accessory muscle use.  GI: BS present and normoactive. Soft, non-tender to palpation. No organomegaly or masses detected. MSK: No erythema, warmth or tenderness. Cap refil <2 sec all extrem. No deformities or joint swelling noted.  Neuro: A/Ox3. No focal deficits noted.   Skin: Warm, no lesions or rashe Psych: Normal affect and behavior. Judgement and thought content appropriate.     Lab Results:  CBC    Component Value Date/Time   WBC 8.0 06/29/2023 0154   RBC 4.57 06/29/2023 0154   HGB 10.9 (L) 06/29/2023 0154   HCT 36.4 (L) 06/29/2023 0154   PLT 271 06/29/2023 0154   MCV 79.6 (L) 06/29/2023 0154   MCH 23.9 (L) 06/29/2023 0154   MCHC 29.9 (L) 06/29/2023 0154   RDW 18.3 (H) 06/29/2023 0154   LYMPHSABS 2.4 04/14/2023 0730   MONOABS 0.8 04/14/2023 0730   EOSABS 0.2 04/14/2023 0730   BASOSABS 0.0 04/14/2023 0730     BMET    Component Value Date/Time   NA 137 06/29/2023 0154   K 2.9 (L) 06/29/2023 0154   CL 100 06/29/2023 0154   CO2 29 06/29/2023 0154   GLUCOSE 103 (H) 06/29/2023 0154   BUN 17 06/29/2023 0154   CREATININE 1.36 (H) 06/29/2023 0154   CALCIUM 8.5 (L) 06/29/2023 0154   GFRNONAA >60 06/29/2023 0154   GFRAA >60 08/12/2019 2123    BNP    Component Value Date/Time   BNP 37.7 06/29/2023 0154     Imaging:  DG Chest 2 View Result Date: 06/29/2023 CLINICAL DATA:  Dyspnea EXAM: CHEST - 2 VIEW COMPARISON:  04/06/2023 FINDINGS: Lungs are well expanded, symmetric, and clear. No pneumothorax or pleural effusion. Cardiac size is mildly enlarged, unchanged. Pulmonary vascularity is normal. Osseous structures are age-appropriate. No acute bone abnormality. IMPRESSION: 1. Stable mild cardiomegaly. Electronically Signed   By: Worthy Heads M.D.   On: 06/29/2023 02:33  Administration History     None           No data to display          No results found for: "NITRICOXIDE"      Assessment & Plan:   Excessive daytime sleepiness He has snoring, excessive daytime sleepiness, nocturnal apneic events, drowsy driving, restless sleep. BMI 73. History of HTN. Epworth 19. Given this,  I am concerned he has sleep disordered breathing with obstructive sleep apnea and likely obesity hypoventilation syndrome. With his BMI and risk of OHS, he will need an in lab split night sleep study. May end up requiring Bilevel support. Ordered urgently given impact on driving and difficulties with refractory HTN. Pt advised to not drive until he is improved.    - discussed how weight can impact sleep and risk for sleep disordered breathing - discussed options to assist with weight loss: combination of diet modification, cardiovascular and strength training exercises   - had an extensive discussion regarding the adverse health consequences related to untreated sleep disordered breathing -  specifically discussed the risks for hypertension, coronary artery disease, cardiac dysrhythmias, cerebrovascular disease, and diabetes - lifestyle modification discussed   - discussed how sleep disruption can increase risk of accidents, particularly when driving - safe driving practices were discussed  Patient Instructions  Given your symptoms, I am concerned that you may have sleep disordered breathing with sleep apnea. You will need an in lab sleep study for further evaluation. Someone will contact you to schedule this.   We discussed how untreated sleep apnea puts an individual at risk for cardiac arrhthymias, pulm HTN, DM, stroke and increases their risk for daytime accidents. We also briefly reviewed treatment options including weight loss, side sleeping position, oral appliance, CPAP therapy or referral to ENT for possible surgical options  Use caution when driving and pull over if you become sleepy.  Your blood pressure was very high this morning. Go home and take your medications and if you are still >170/90, go to the emergency department. You also need to contact your primary care doctor to discuss the elevated readings.   Follow up in 8 weeks with Katie Lexii Walsh,NP to go over sleep study results, or sooner, if needed. Friday PM virtual clinic preferred      Essential hypertension Significantly elevated BP. He has not taken his antihypertensives today. No red flag symptoms. Advised to go straight home, administer BP medications and if no improvement in BP or acute symptoms develop, go to the ED. Advised to discuss with PCP/cardiology   Obesity hypoventilation syndrome (HCC) BMI 73. Healthy weight loss encouraged. See above   Advised if symptoms do not improve or worsen, to please contact office for sooner follow up or seek emergency care.   I spent 45 minutes of dedicated to the care of this patient on the date of this encounter to include pre-visit review of records, face-to-face  time with the patient discussing conditions above, post visit ordering of testing, clinical documentation with the electronic health record, making appropriate referrals as documented, and communicating necessary findings to members of the patients care team.  Roetta Clarke, NP 07/09/2023  Pt aware and understands NP's role.

## 2023-07-09 NOTE — Assessment & Plan Note (Signed)
 BMI 73. Healthy weight loss encouraged. See above

## 2023-07-09 NOTE — Patient Instructions (Signed)
 Given your symptoms, I am concerned that you may have sleep disordered breathing with sleep apnea. You will need an in lab sleep study for further evaluation. Someone will contact you to schedule this.   We discussed how untreated sleep apnea puts an individual at risk for cardiac arrhthymias, pulm HTN, DM, stroke and increases their risk for daytime accidents. We also briefly reviewed treatment options including weight loss, side sleeping position, oral appliance, CPAP therapy or referral to ENT for possible surgical options  Use caution when driving and pull over if you become sleepy.  Your blood pressure was very high this morning. Go home and take your medications and if you are still >170/90, go to the emergency department. You also need to contact your primary care doctor to discuss the elevated readings.   Follow up in 8 weeks with Katie Toriana Sponsel,NP to go over sleep study results, or sooner, if needed. Friday PM virtual clinic preferred

## 2023-07-09 NOTE — Assessment & Plan Note (Signed)
 He has snoring, excessive daytime sleepiness, nocturnal apneic events, drowsy driving, restless sleep. BMI 73. History of HTN. Epworth 19. Given this,  I am concerned he has sleep disordered breathing with obstructive sleep apnea and likely obesity hypoventilation syndrome. With his BMI and risk of OHS, he will need an in lab split night sleep study. May end up requiring Bilevel support. Ordered urgently given impact on driving and difficulties with refractory HTN. Pt advised to not drive until he is improved.    - discussed how weight can impact sleep and risk for sleep disordered breathing - discussed options to assist with weight loss: combination of diet modification, cardiovascular and strength training exercises   - had an extensive discussion regarding the adverse health consequences related to untreated sleep disordered breathing - specifically discussed the risks for hypertension, coronary artery disease, cardiac dysrhythmias, cerebrovascular disease, and diabetes - lifestyle modification discussed   - discussed how sleep disruption can increase risk of accidents, particularly when driving - safe driving practices were discussed  Patient Instructions  Given your symptoms, I am concerned that you may have sleep disordered breathing with sleep apnea. You will need an in lab sleep study for further evaluation. Someone will contact you to schedule this.   We discussed how untreated sleep apnea puts an individual at risk for cardiac arrhthymias, pulm HTN, DM, stroke and increases their risk for daytime accidents. We also briefly reviewed treatment options including weight loss, side sleeping position, oral appliance, CPAP therapy or referral to ENT for possible surgical options  Use caution when driving and pull over if you become sleepy.  Your blood pressure was very high this morning. Go home and take your medications and if you are still >170/90, go to the emergency department. You also  need to contact your primary care doctor to discuss the elevated readings.   Follow up in 8 weeks with Katie Chandel Zaun,NP to go over sleep study results, or sooner, if needed. Friday PM virtual clinic preferred

## 2023-07-09 NOTE — Telephone Encounter (Signed)
 Called pt to see if interested in rescheduling appt. Pt did not answer and LVM

## 2023-07-09 NOTE — Assessment & Plan Note (Signed)
 Significantly elevated BP. He has not taken his antihypertensives today. No red flag symptoms. Advised to go straight home, administer BP medications and if no improvement in BP or acute symptoms develop, go to the ED. Advised to discuss with PCP/cardiology

## 2023-07-11 ENCOUNTER — Ambulatory Visit (HOSPITAL_BASED_OUTPATIENT_CLINIC_OR_DEPARTMENT_OTHER): Attending: Nurse Practitioner | Admitting: Internal Medicine

## 2023-07-11 ENCOUNTER — Encounter (HOSPITAL_BASED_OUTPATIENT_CLINIC_OR_DEPARTMENT_OTHER): Payer: Self-pay

## 2023-07-11 DIAGNOSIS — E66813 Obesity, class 3: Secondary | ICD-10-CM | POA: Insufficient documentation

## 2023-07-11 DIAGNOSIS — Z6841 Body Mass Index (BMI) 40.0 and over, adult: Secondary | ICD-10-CM | POA: Diagnosis not present

## 2023-07-11 DIAGNOSIS — G4719 Other hypersomnia: Secondary | ICD-10-CM | POA: Diagnosis not present

## 2023-07-11 DIAGNOSIS — G4736 Sleep related hypoventilation in conditions classified elsewhere: Secondary | ICD-10-CM | POA: Insufficient documentation

## 2023-07-11 DIAGNOSIS — E662 Morbid (severe) obesity with alveolar hypoventilation: Secondary | ICD-10-CM | POA: Diagnosis present

## 2023-07-11 DIAGNOSIS — R0681 Apnea, not elsewhere classified: Secondary | ICD-10-CM

## 2023-07-11 DIAGNOSIS — G4733 Obstructive sleep apnea (adult) (pediatric): Secondary | ICD-10-CM | POA: Insufficient documentation

## 2023-07-11 DIAGNOSIS — R0683 Snoring: Secondary | ICD-10-CM

## 2023-07-12 ENCOUNTER — Emergency Department (HOSPITAL_COMMUNITY)

## 2023-07-12 ENCOUNTER — Encounter (HOSPITAL_COMMUNITY): Payer: Self-pay

## 2023-07-12 ENCOUNTER — Other Ambulatory Visit: Payer: Self-pay

## 2023-07-12 ENCOUNTER — Observation Stay (HOSPITAL_COMMUNITY)
Admission: EM | Admit: 2023-07-12 | Discharge: 2023-07-13 | Disposition: A | Discharge status: Home / Self Care | Attending: Hospitalist | Admitting: Hospitalist

## 2023-07-12 DIAGNOSIS — F1090 Alcohol use, unspecified, uncomplicated: Secondary | ICD-10-CM | POA: Insufficient documentation

## 2023-07-12 DIAGNOSIS — E66813 Obesity, class 3: Secondary | ICD-10-CM | POA: Insufficient documentation

## 2023-07-12 DIAGNOSIS — R0602 Shortness of breath: Secondary | ICD-10-CM | POA: Diagnosis not present

## 2023-07-12 DIAGNOSIS — E876 Hypokalemia: Principal | ICD-10-CM | POA: Insufficient documentation

## 2023-07-12 DIAGNOSIS — I4891 Unspecified atrial fibrillation: Secondary | ICD-10-CM | POA: Diagnosis not present

## 2023-07-12 DIAGNOSIS — I10 Essential (primary) hypertension: Secondary | ICD-10-CM | POA: Insufficient documentation

## 2023-07-12 DIAGNOSIS — Z7901 Long term (current) use of anticoagulants: Secondary | ICD-10-CM | POA: Diagnosis not present

## 2023-07-12 DIAGNOSIS — I480 Paroxysmal atrial fibrillation: Secondary | ICD-10-CM | POA: Diagnosis not present

## 2023-07-12 DIAGNOSIS — F17210 Nicotine dependence, cigarettes, uncomplicated: Secondary | ICD-10-CM | POA: Insufficient documentation

## 2023-07-12 DIAGNOSIS — Z6841 Body Mass Index (BMI) 40.0 and over, adult: Secondary | ICD-10-CM | POA: Diagnosis not present

## 2023-07-12 DIAGNOSIS — Z79899 Other long term (current) drug therapy: Secondary | ICD-10-CM | POA: Insufficient documentation

## 2023-07-12 DIAGNOSIS — G4730 Sleep apnea, unspecified: Secondary | ICD-10-CM | POA: Diagnosis not present

## 2023-07-12 DIAGNOSIS — R062 Wheezing: Secondary | ICD-10-CM | POA: Diagnosis not present

## 2023-07-12 DIAGNOSIS — R000 Tachycardia, unspecified: Secondary | ICD-10-CM | POA: Diagnosis not present

## 2023-07-12 LAB — BASIC METABOLIC PANEL WITH GFR
Anion gap: 10 (ref 5–15)
BUN: 13 mg/dL (ref 6–20)
CO2: 30 mmol/L (ref 22–32)
Calcium: 8.6 mg/dL — ABNORMAL LOW (ref 8.9–10.3)
Chloride: 98 mmol/L (ref 98–111)
Creatinine, Ser: 1.33 mg/dL — ABNORMAL HIGH (ref 0.61–1.24)
GFR, Estimated: >60 mL/min (ref >60)
Glucose, Bld: 123 mg/dL — ABNORMAL HIGH (ref 70–99)
Potassium: 2.8 mmol/L — ABNORMAL LOW (ref 3.5–5.1)
Sodium: 138 mmol/L (ref 135–145)

## 2023-07-12 LAB — I-STAT VENOUS BLOOD GAS, ED
Acid-Base Excess: 6 mmol/L — ABNORMAL HIGH (ref 0.0–2.0)
Bicarbonate: 32.9 mmol/L — ABNORMAL HIGH (ref 20.0–28.0)
Calcium, Ion: 1.12 mmol/L — ABNORMAL LOW (ref 1.15–1.40)
HCT: 40 % (ref 39.0–52.0)
Hemoglobin: 13.6 g/dL (ref 13.0–17.0)
O2 Saturation: 89 %
Potassium: 2.6 mmol/L — CL (ref 3.5–5.1)
Sodium: 142 mmol/L (ref 135–145)
TCO2: 35 mmol/L — ABNORMAL HIGH (ref 22–32)
pCO2, Ven: 53.8 mmHg (ref 44–60)
pH, Ven: 7.395 (ref 7.25–7.43)
pO2, Ven: 58 mmHg — ABNORMAL HIGH (ref 32–45)

## 2023-07-12 LAB — LIPID PANEL
Cholesterol: 164 mg/dL (ref 0–200)
HDL: 39 mg/dL — ABNORMAL LOW (ref >40)
LDL Cholesterol: 112 mg/dL — ABNORMAL HIGH (ref 0–99)
Total CHOL/HDL Ratio: 4.2 ratio
Triglycerides: 65 mg/dL (ref <150)
VLDL: 13 mg/dL (ref 0–40)

## 2023-07-12 LAB — CBC WITH DIFFERENTIAL/PLATELET
Abs Immature Granulocytes: 0.03 10*3/uL (ref 0.00–0.07)
Basophils Absolute: 0 10*3/uL (ref 0.0–0.1)
Basophils Relative: 0 %
Eosinophils Absolute: 0.2 10*3/uL (ref 0.0–0.5)
Eosinophils Relative: 3 %
HCT: 39.5 % (ref 39.0–52.0)
Hemoglobin: 11.6 g/dL — ABNORMAL LOW (ref 13.0–17.0)
Immature Granulocytes: 0 %
Lymphocytes Relative: 27 %
Lymphs Abs: 2.2 10*3/uL (ref 0.7–4.0)
MCH: 23.3 pg — ABNORMAL LOW (ref 26.0–34.0)
MCHC: 29.4 g/dL — ABNORMAL LOW (ref 30.0–36.0)
MCV: 79.3 fL — ABNORMAL LOW (ref 80.0–100.0)
Monocytes Absolute: 0.6 10*3/uL (ref 0.1–1.0)
Monocytes Relative: 7 %
Neutro Abs: 5.2 10*3/uL (ref 1.7–7.7)
Neutrophils Relative %: 63 %
Platelets: 256 10*3/uL (ref 150–400)
RBC: 4.98 MIL/uL (ref 4.22–5.81)
RDW: 18.3 % — ABNORMAL HIGH (ref 11.5–15.5)
WBC: 8.2 10*3/uL (ref 4.0–10.5)
nRBC: 0 % (ref 0.0–0.2)

## 2023-07-12 LAB — BRAIN NATRIURETIC PEPTIDE: B Natriuretic Peptide: 43.7 pg/mL (ref 0.0–100.0)

## 2023-07-12 LAB — HEMOGLOBIN A1C
Hgb A1c MFr Bld: 5.5 % (ref 4.8–5.6)
Mean Plasma Glucose: 111.15 mg/dL

## 2023-07-12 LAB — TSH: TSH: 3.03 u[IU]/mL (ref 0.350–4.500)

## 2023-07-12 LAB — MAGNESIUM: Magnesium: 1.8 mg/dL (ref 1.7–2.4)

## 2023-07-12 MED ORDER — DILTIAZEM LOAD VIA INFUSION
15.0000 mg | Freq: Once | INTRAVENOUS | Status: AC
  Administered 2023-07-12: 15 mg via INTRAVENOUS
  Filled 2023-07-12: qty 15

## 2023-07-12 MED ORDER — CARVEDILOL 25 MG PO TABS
25.0000 mg | ORAL_TABLET | Freq: Two times a day (BID) | ORAL | Status: DC
  Administered 2023-07-12 – 2023-07-13 (×3): 25 mg via ORAL
  Filled 2023-07-12: qty 2
  Filled 2023-07-12 (×2): qty 1

## 2023-07-12 MED ORDER — POTASSIUM CHLORIDE 20 MEQ PO PACK
40.0000 meq | PACK | Freq: Three times a day (TID) | ORAL | Status: AC
  Administered 2023-07-12 (×3): 40 meq via ORAL
  Filled 2023-07-12 (×3): qty 2

## 2023-07-12 MED ORDER — AMLODIPINE BESYLATE 5 MG PO TABS: 10.0000 mg | ORAL_TABLET | Freq: Every day | ORAL | Status: DC

## 2023-07-12 MED ORDER — CARVEDILOL 12.5 MG PO TABS: 25.0000 mg | ORAL_TABLET | Freq: Two times a day (BID) | ORAL | Status: DC

## 2023-07-12 MED ORDER — POTASSIUM CHLORIDE 10 MEQ/100ML IV SOLN
10.0000 meq | INTRAVENOUS | Status: AC
  Administered 2023-07-12 (×2): 10 meq via INTRAVENOUS
  Filled 2023-07-12 (×2): qty 100

## 2023-07-12 MED ORDER — ENOXAPARIN SODIUM 120 MG/0.8ML IJ SOSY
120.0000 mg | PREFILLED_SYRINGE | INTRAMUSCULAR | Status: DC
  Administered 2023-07-12 – 2023-07-13 (×2): 120 mg via SUBCUTANEOUS
  Filled 2023-07-12 (×2): qty 0.8

## 2023-07-12 MED ORDER — DILTIAZEM HCL-DEXTROSE 125-5 MG/125ML-% IV SOLN (PREMIX)
5.0000 mg/h | INTRAVENOUS | Status: DC
  Administered 2023-07-12: 15 mg/h via INTRAVENOUS
  Administered 2023-07-12: 5 mg/h via INTRAVENOUS
  Administered 2023-07-12 – 2023-07-13 (×2): 15 mg/h via INTRAVENOUS
  Filled 2023-07-12 (×4): qty 125

## 2023-07-12 MED ORDER — ACETAMINOPHEN 500 MG PO TABS
1000.0000 mg | ORAL_TABLET | Freq: Four times a day (QID) | ORAL | Status: DC | PRN
  Administered 2023-07-13: 1000 mg via ORAL
  Filled 2023-07-12: qty 2

## 2023-07-12 NOTE — ED Triage Notes (Signed)
 Pt BIB [facility] from home d/t sob that started this morning. Pt woke up and had a cup of coffee then started feeling SOB. Pt was in AFIB  w HR in the 190s-200s. 40 mg cardizem was given by EMS which did brought the HR down to around 100s.  5mg  albuterol  was also given.  Pt does not wear O2 at baseline. A&O X4. Hx CHF.   BP 168/110

## 2023-07-12 NOTE — H&P (Signed)
 History and Physical    Patient: Jared Mccoy BJY:[id number] DOB: 1991-02-04 DOA: 07/12/2023 DOS: the patient was seen and examined on 07/12/2023 PCP: Marius Siemens, NP  Patient coming from: Home  Chief Complaint:  Chief Complaint  Patient presents with   Shortness of Breath   HPI: [name] is a 33 y.o. adult with medical history significant of HTN, and obesity p/w new onset AFRVR.  Pt states that he was in his USOH until this morning after his sleep test at 0530. Pt states that he completed his sleep test and at breakfast sandwich from [employer] and a mocha frappe from [employer]. He began to have 4/10 chest tightness and heart fluttering but initially thought this was a brain freeze; however, when the sx didn't resolve and his breathing became more labored he presented to the ED for further evaluation. Of note, the patient denies any history of Afib or arrhythmias. Of note, he does endorse a similar episode of a fast heart rate once before weeks ago when he missed his evening and morning dose of carvedilol . Pt endorses missing his evening carvedilol  dose yesterday as well.  In the ED, pt tachycardic and tachypneic on RA. Labs notable for K 2.6, Cr 1.33, BNP 43, troponin 20-->18, and glucose 123. CXR showed enlarged cardiac silhouette. EKG pending. Pt admitted to medicine for ongoing care with Cards following per EDP consult.  Review of Systems: As mentioned in the history of present illness. All other systems reviewed and are negative. Past Medical History:  Diagnosis Date   Hypertension    not on medication   Obese    Peripheral vascular disease (HCC)    edema in legs    Past Surgical History:  Procedure Laterality Date   MANDIBULAR HARDWARE REMOVAL N/A 12/27/2016   Procedure: MANDIBULAR HARDWARE REMOVAL;  Surgeon: Reynold Caves, MD;  Location: [facility] OR;  Service: ENT;  Laterality: N/A;   OPEN REDUCTION INTERNAL FIXATION (ORIF) DISTAL RADIAL FRACTURE Left 11/10/2016   Procedure:  OPEN REDUCTION INTERNAL FIXATION (ORIF) DISTAL RADIAL FRACTURE;  Surgeon: Rober Chimera, MD;  Location: [facility];  Service: Orthopedics;  Laterality: Left;   ORIF MANDIBULAR FRACTURE Right 11/10/2016   Procedure: OPEN REDUCTION INTERNAL FIXATION (ORIF) MANDIBULAR FRACTURE, Ear Laceration Repair;  Surgeon: Reynold Caves, MD;  Location: [facility] OR;  Service: ENT;  Laterality: Right;   Social History:  reports that he has been smoking cigarettes. He has a 10 pack-year smoking history. He has never used smokeless tobacco. He reports current alcohol use. He reports that he does not use drugs.  Allergies  Allergen Reactions   Crab (Diagnostic) Diarrhea and Nausea And Vomiting    Reaction only to crab legs     History reviewed. No pertinent family history.  Prior to Admission medications   Medication Sig Start Date End Date Taking? Authorizing Provider  amLODipine  (NORVASC ) 10 MG tablet Take 1 tablet (10 mg total) by mouth daily. 04/16/23  Yes Marius Siemens, NP  carvedilol  (COREG ) 25 MG tablet Take 1 tablet (25 mg total) by mouth 2 (two) times daily with a meal. 04/16/23  Yes Marius Siemens, NP  hydrALAZINE  (APRESOLINE ) 50 MG tablet Take 1 tablet (50 mg total) by mouth 3 (three) times daily. 04/16/23  Yes Marius Siemens, NP  ibuprofen  (ADVIL ) 800 MG tablet Take 1 tablet (800 mg total) by mouth every 8 (eight) hours as needed. 06/03/23  Yes Marius Siemens, NP    Physical Exam: Vitals:   07/12/23 0865  07/12/23 1030 07/12/23 1100 07/12/23 1130  BP:  117/84 132/83 122/75  Pulse:   (!) 107 (!) 107  Resp:  18 20 (!) 22  Temp: 97.6 F (36.4 C)     TempSrc: Oral     SpO2:   95% 98%   General: Alert, oriented x3, resting comfortably in no acute distress Respiratory: Lungs clear to auscultation bilaterally with normal respiratory effort; no w/r/r Cardiovascular: Tachycardic; irregularly irregular; no murmurs   Data Reviewed:  Lab Results  Component Value Date   WBC 8.2 07/12/2023   HGB  13.6 07/12/2023   HCT 40.0 07/12/2023   MCV 79.3 (L) 07/12/2023   PLT 256 07/12/2023   Lab Results  Component Value Date   GLUCOSE 123 (H) 07/12/2023   CALCIUM 8.6 (L) 07/12/2023   NA 142 07/12/2023   K 2.6 (LL) 07/12/2023   CO2 30 07/12/2023   CL 98 07/12/2023   BUN 13 07/12/2023   CREATININE 1.33 (H) 07/12/2023   Lab Results  Component Value Date   ALT 17 04/06/2023   AST 23 04/06/2023   ALKPHOS 58 04/06/2023   BILITOT 0.7 04/06/2023   Lab Results  Component Value Date   INR 1.0 01/30/2020   INR 0.98 11/09/2016    Radiology: DG Chest Portable 1 View Result Date: 07/12/2023 CLINICAL DATA:  SOB EXAM: PORTABLE CHEST 1 VIEW COMPARISON:  06/29/2023 FINDINGS: Cardiac silhouette appears prominent. No pneumonia or pulmonary edema. No pneumothorax or pleural effusion. Osseous structures intact. IMPRESSION: Enlarged cardiac silhouette. Electronically Signed   By: Sydell Eva M.D.   On: 07/12/2023 08:31    Assessment and Plan: 64M h/o HTN, and obesity p/w new onset AFRVR.  AFRVR CHADSVASc 1 (HTN; charted h/o of PVD w/o ABIs and CHF w/o TTE, elevated BNP but previously on lasix  for unclear reasoning); previously cocaine positive Utox in 05/2023 -Cards consulted per EDP; apprec eval/recs -F/u TTE and ABI to exclude PVD and CHF; if either positive, then pt will need OAC -Continue IV diltiazem gtt for now; will plan to transition to pta carvedilol  25mg  BID -ASA 81mg  daily alone for now -F/u TSH, A1c, and lipid panel for risk stratification F/u urine drug screen  HTN -HOLD amlodipine  10mg  daily for now while on IV diltiazem (resume at d/c)   Advance Care Planning:   Code Status: Full Code   Consults: Cards  Family Communication: N/A  Severity of Illness: The appropriate patient status for this patient is INPATIENT. Inpatient status is judged to be reasonable and necessary in order to provide the required intensity of service to ensure the patient's safety. The patient's  presenting symptoms, physical exam findings, and initial radiographic and laboratory data in the context of their chronic comorbidities is felt to place them at high risk for further clinical deterioration. Furthermore, it is not anticipated that the patient will be medically stable for discharge from the hospital within 2 midnights of admission.   * I certify that at the point of admission it is my clinical judgment that the patient will require inpatient hospital care spanning beyond 2 midnights from the point of admission due to high intensity of service, high risk for further deterioration and high frequency of surveillance required.*   ------- I spent 55 minutes reviewing previous labs/notes, obtaining separate history at the bedside, counseling/discussing the treatment plan outlined above, ordering medications/tests, and performing clinical documentation.  Author: Arne Langdon, MD 07/12/2023 12:39 PM  For on call review [email or web address].

## 2023-07-12 NOTE — ED Provider Notes (Signed)
 [place] EMERGENCY DEPARTMENT AT [facility] Provider Note   CSN: [id number] Arrival date & time: 07/12/23  6213     History  Chief Complaint  Patient presents with   Shortness of Breath    Jared Mccoy is a 33 y.o. adult presenting to the emergency department with concern for shortness of breath and palpitations.  Patient reports onset this morning after having a cup of coffee and feeling short of breath.  Denies prior history of asthma.  He does have a history of hypertension and obesity.  EMS reports the patient was in A-fib with a heart rate of 190s and he was given 40 mg of Cardizem with improvement of his heart rate.  He was also given 5 mg of albuterol  by EMS.  HPI     Home Medications Prior to Admission medications   Medication Sig Start Date End Date Taking? Authorizing Provider  amLODipine  (NORVASC ) 10 MG tablet Take 1 tablet (10 mg total) by mouth daily. 04/16/23  Yes Marius Siemens, NP  carvedilol  (COREG ) 25 MG tablet Take 1 tablet (25 mg total) by mouth 2 (two) times daily with a meal. 04/16/23  Yes Marius Siemens, NP  hydrALAZINE  (APRESOLINE ) 50 MG tablet Take 1 tablet (50 mg total) by mouth 3 (three) times daily. 04/16/23  Yes Marius Siemens, NP  ibuprofen  (ADVIL ) 800 MG tablet Take 1 tablet (800 mg total) by mouth every 8 (eight) hours as needed. 06/03/23  Yes Marius Siemens, NP      Allergies    Crab (diagnostic)    Review of Systems   Review of Systems  Physical Exam Updated Vital Signs BP (!) 143/87 (BP Location: Left Wrist)   Pulse 89   Temp 98 F (36.7 C) (Oral)   Resp 18   Ht 6' (1.829 m)   Wt (!) 252.9 kg   SpO2 97%   BMI 75.62 kg/m  Physical Exam Constitutional:      General: He is not in acute distress.    Appearance: He is obese.  HENT:     Head: Normocephalic and atraumatic.  Eyes:     Conjunctiva/sclera: Conjunctivae normal.     Pupils: Pupils are equal, round, and reactive to light.  Cardiovascular:      Rate and Rhythm: Tachycardia present. Rhythm irregular.  Pulmonary:     Effort: Pulmonary effort is normal. No respiratory distress.     Comments: 94% O2 sat on room air, Lungs difficult to ausculate due to habitus, voice is clear Faint expiratory wheezing or rales heard on exam Abdominal:     General: There is no distension.     Tenderness: There is no abdominal tenderness.  Skin:    General: Skin is warm and dry.  Neurological:     General: No focal deficit present.     Mental Status: He is alert. Mental status is at baseline.  Psychiatric:        Mood and Affect: Mood normal.        Behavior: Behavior normal.     ED Results / Procedures / Treatments   Labs (all labs ordered are listed, but only abnormal results are displayed) Labs Reviewed  BASIC METABOLIC PANEL WITH GFR - Abnormal; Notable for the following components:      Result Value   Potassium 2.8 (*)    Glucose, Bld 123 (*)    Creatinine, Ser 1.33 (*)    Calcium 8.6 (*)    All other components  within normal limits  CBC WITH DIFFERENTIAL/PLATELET - Abnormal; Notable for the following components:   Hemoglobin 11.6 (*)    MCV 79.3 (*)    MCH 23.3 (*)    MCHC 29.4 (*)    RDW 18.3 (*)    All other components within normal limits  LIPID PANEL - Abnormal; Notable for the following components:   HDL 39 (*)    LDL Cholesterol 112 (*)    All other components within normal limits  I-STAT VENOUS BLOOD GAS, ED - Abnormal; Notable for the following components:   pO2, Ven 58 (*)    Bicarbonate 32.9 (*)    TCO2 35 (*)    Acid-Base Excess 6.0 (*)    Potassium 2.6 (*)    Calcium, Ion 1.12 (*)    All other components within normal limits  BRAIN NATRIURETIC PEPTIDE  MAGNESIUM   TSH  HEMOGLOBIN A1C  RAPID URINE DRUG SCREEN, HOSP PERFORMED    EKG None  Radiology DG Chest Portable 1 View Result Date: 07/12/2023 CLINICAL DATA:  SOB EXAM: PORTABLE CHEST 1 VIEW COMPARISON:  06/29/2023 FINDINGS: Cardiac silhouette appears  prominent. No pneumonia or pulmonary edema. No pneumothorax or pleural effusion. Osseous structures intact. IMPRESSION: Enlarged cardiac silhouette. Electronically Signed   By: Sydell Eva M.D.   On: 07/12/2023 08:31    Procedures .Critical Care  Performed by: Arvilla Birmingham, MD Authorized by: Arvilla Birmingham, MD   Critical care provider statement:    Critical care time (minutes):  45   Critical care time was exclusive of:  Separately billable procedures and treating other patients   Critical care was necessary to treat or prevent imminent or life-threatening deterioration of the following conditions:  Circulatory failure   Critical care was time spent personally by me on the following activities:  Ordering and performing treatments and interventions, ordering and review of laboratory studies, ordering and review of radiographic studies, pulse oximetry, review of old charts, examination of patient and evaluation of patient's response to treatment Comments:     If of his RVR rate control     Medications Ordered in ED Medications  diltiazem (CARDIZEM) 1 mg/mL load via infusion 15 mg (15 mg Intravenous Bolus from Bag 07/12/23 0820)    And  diltiazem (CARDIZEM) 125 mg in dextrose  5% 125 mL (1 mg/mL) infusion (15 mg/hr Intravenous New Bag/Given 07/12/23 1521)  enoxaparin  (LOVENOX ) injection 120 mg (120 mg Subcutaneous Given 07/12/23 1057)  carvedilol  (COREG ) tablet 25 mg (25 mg Oral Given 07/12/23 1352)  potassium chloride  (KLOR-CON ) packet 40 mEq (40 mEq Oral Given 07/12/23 1522)  acetaminophen  (TYLENOL ) tablet 1,000 mg (has no administration in time range)  potassium chloride  10 mEq in 100 mL IVPB (0 mEq Intravenous Stopped 07/12/23 1345)    ED Course/ Medical Decision Making/ A&P Clinical Course as of 07/12/23 1638  Sat Jul 12, 2023  0931 B Natriuretic Peptide: 43.7 [MT]  0931 I went to reassess pt, he is more awake now, Hr 120-130's in A Fib RVR, BP okay.  Labs with K 2.8.  Will  order IV K and admit for new onset A Fib RVR.  I'll discuss anticoagulation with cardiology and also brief V Tach - CHADSVASC2 score of 1 or 2 - unclear if he has prior history of CHF.   [MT]  0932 I also addressed patient's dispute with his nurse as he was upset his left hand IV may have infiltrated. I explained this sometimes happens, and the IV was removed, but I  asked that he and his sister remain civil with staff, as he was repeatedly stating we are "trying to kill him" and referred to his nurse as "that chinese girl."  I informed them of his nurse's name and asked that we not use racial terms. I calmly explained to them that we are trying to address his serious medical condition, not injure him, and without treatment it will likely continue to get worse.  I explained the A Fib with RVR and concern for developing heart failure or other complications, and need for rate control, possible anticoagulation, and hospitalization. We have switched him to another nurse to de-escalate this situation. [MT]  [phone] Dr Amanda Jungling cardiology advises okay to hold off on a/c if chadsvasc score of 1 at this time - if there is evidence of heart failure on echo to raise his risk score, they can be reconsulted on this issue.  Otherwise patient is stable for admission  [MT]  1025 Admitted to hospitalist [MT]    Clinical Course User Index [MT] Librado Guandique, Janalyn Me, MD                                 Medical Decision Making Amount and/or Complexity of Data Reviewed Labs: ordered. Decision-making details documented in ED Course. Radiology: ordered.  Risk Prescription drug management. Decision regarding hospitalization.   This patient presents to the ED with concern for palpitations and shortness of breath. This involves an extensive number of treatment options, and is a complaint that carries with it a high risk of complications and morbidity.  The differential diagnosis includes somatic A-fib with RVR versus asthma  exacerbation or reactive airway disease versus pleural effusion or pneumonia versus other  Co-morbidities that complicate the patient evaluation: High blood pressure cardiovascular risk factor  Additional history obtained from EMS  External records from outside source obtained and reviewed including prior hospital discharge summary including relevant medical history for hypertension, on 3 antihypertensive medications  I ordered and personally interpreted labs.  The pertinent results include: 2.8.  BNP normal.  Venous gas with likely chronic CO2 retention elevated bicarb, no acidosis  I ordered imaging studies including x-ray of the chest I independently visualized and interpreted imaging which showed vaguely, no other emergent findings I agree with the radiologist interpretation  The patient was maintained on a cardiac monitor.  I personally viewed and interpreted the cardiac monitored which showed an underlying rhythm of: A-fib with RVR.  Occasional very short 3-4 beats V tach  Per my interpretation the patient's ECG shows A-fib with RVR  I ordered medication including diltiazem for rate control, IV potassium for hypokalemia repletion  I have reviewed the patients home medicines and have made adjustments as needed  Test Considered: Low suspicion for acute PE in this clinical setting  The case was discussed with cardiology by phone, see ED course, at this time we will hold off on anticoagulation.  After the interventions noted above, I reevaluated the patient and found that they have: improved -heart rate has improved but remains in RVR and will need continued titration of diltiazem  Disposition:  After consideration of the diagnostic results and the patients response to treatment, I feel that the patent would benefit from medical admission.         Final Clinical Impression(s) / ED Diagnoses Final diagnoses:  Hypokalemia  Atrial fibrillation with RVR (HCC)    Rx / DC  Orders ED Discharge  Orders     None         Arvilla Birmingham, MD 07/12/23 [phone]

## 2023-07-12 NOTE — ED Notes (Signed)
 Spoke with patient and family with [facility] [employer] and have deescalated situation and spoke to them about the plan.  Also moved patient to another room with a different nurse per patient request.

## 2023-07-12 NOTE — ED Notes (Addendum)
 The pt and family member who is at beside are rude towards This [employer] fund[employer]. Pt came in w a swollen L arm and hand. This RN did a successful U/S IV on this pt that flushed perfectly with blood pulling back and no indications of infiltration. This Pt said it hurts , this RN took out the IV and assessed the site. The site looked clean, dry and no signs of infiltration. The visitor at the bedside stated " why did they bring you over here and not [facility], they do not do shit here."  Both MD , charge and [facility] are aware of the situation.

## 2023-07-12 NOTE — ED Notes (Signed)
 6 E aware patient is coming

## 2023-07-13 ENCOUNTER — Inpatient Hospital Stay (HOSPITAL_BASED_OUTPATIENT_CLINIC_OR_DEPARTMENT_OTHER)

## 2023-07-13 ENCOUNTER — Encounter (HOSPITAL_COMMUNITY)

## 2023-07-13 DIAGNOSIS — I4891 Unspecified atrial fibrillation: Secondary | ICD-10-CM | POA: Diagnosis not present

## 2023-07-13 DIAGNOSIS — I480 Paroxysmal atrial fibrillation: Secondary | ICD-10-CM | POA: Diagnosis present

## 2023-07-13 LAB — ECHOCARDIOGRAM COMPLETE
AR max vel: 4.38 cm2
AV Area VTI: 4.74 cm2
AV Area mean vel: 4.31 cm2
AV Mean grad: 3.2 mmHg
AV Peak grad: 6.7 mmHg
Ao pk vel: 1.29 m/s
Area-P 1/2: 3.68 cm2
S' Lateral: 3.9 cm

## 2023-07-13 LAB — RAPID URINE DRUG SCREEN, HOSP PERFORMED
Amphetamines: NOT DETECTED
Barbiturates: NOT DETECTED
Benzodiazepines: NOT DETECTED
Cocaine: POSITIVE — AB
Opiates: NOT DETECTED
Tetrahydrocannabinol: POSITIVE — AB

## 2023-07-13 LAB — BASIC METABOLIC PANEL WITH GFR
Anion gap: 8 (ref 5–15)
BUN: 13 mg/dL (ref 6–20)
CO2: 31 mmol/L (ref 22–32)
Calcium: 8.2 mg/dL — ABNORMAL LOW (ref 8.9–10.3)
Chloride: 100 mmol/L (ref 98–111)
Creatinine, Ser: 1.24 mg/dL (ref 0.61–1.24)
GFR, Estimated: >60 mL/min (ref >60)
Glucose, Bld: 96 mg/dL (ref 70–99)
Potassium: 3.2 mmol/L — ABNORMAL LOW (ref 3.5–5.1)
Sodium: 139 mmol/L (ref 135–145)

## 2023-07-13 MED ORDER — AMLODIPINE BESYLATE 10 MG PO TABS
10.0000 mg | ORAL_TABLET | Freq: Every day | ORAL | Status: DC
  Administered 2023-07-13: 10 mg via ORAL
  Filled 2023-07-13: qty 1

## 2023-07-13 MED ORDER — ASPIRIN 81 MG PO TBEC: 81.0000 mg | DELAYED_RELEASE_TABLET | Freq: Every day | ORAL | Status: DC

## 2023-07-13 MED ORDER — PERFLUTREN LIPID MICROSPHERE
1.0000 mL | INTRAVENOUS | Status: AC | PRN
  Administered 2023-07-13: 2 mL via INTRAVENOUS

## 2023-07-13 NOTE — Plan of Care (Signed)

## 2023-07-13 NOTE — Progress Notes (Signed)
 [facility] night cross cover note:   I was notified by the patient's RN of the patient's elevated blood pressure, with most recent BP noted to be 222/111 with heart rate of 75.  Patient without any reported new symptoms associated with this elevated blood pressure.  He is here for atrial fibrillation with RVR, and is currently on diltiazem drip at a rate of 15 mg/h.  I subsequently resumed his outpatient amlodipine  10 mg p.o. daily, first dose now, and will monitor for any ensuing reflex tachycardia.  It is also noted that the patient is scheduled to receive his outpatient Coreg  25 mg twice daily, with next dose to occur later this morning.     Camelia Cavalier, DO Hospitalist

## 2023-07-13 NOTE — Care Management CC44 (Signed)
 Condition Code 44 Documentation Completed  Patient Details  Name: Jared Mccoy MRN: [id number] Date of Birth: 01-18-1991   Condition Code 44 given:   yes Patient signature on Condition Code 44 notice:    Documentation of 2 MD's agreement:    Code 44 added to claim:       Jeffory Mings, LCSW 07/13/2023, 5:08 PM

## 2023-07-13 NOTE — Progress Notes (Signed)
  Echocardiogram 2D Echocardiogram has been performed.  Dione Franks 07/13/2023, 2:39 PM

## 2023-07-13 NOTE — Discharge Summary (Signed)
 Physician Discharge Summary   Patient: Jared Mccoy MRN: [id number] DOB: 1990/08/13  Admit date:     07/12/2023  Discharge date: 07/13/23  Discharge Physician: Ephriam Hashimoto   PCP: Marius Siemens, NP     Recommendations at discharge:  Follow up with PCP Madelyn Schick in 1 week for new Afib Cardiology referral sent Please follow up sleep study for initiating CPAP Please counsel patient on GLP-1 for weight loss     Discharge Diagnoses: Principal Problem:   Paroxysmal atrial fibrillation with RVR Active Problems:   Class III obesity   Hypertension   Presumed sleep apnea    Hospital Course: 33 y.o. M with obesity, HTN presented with chest discomfort, found to have Afib.  Had presented a few weeks earlier to the ER with similar symptoms, which resolved prior to presentation, at that time EKG and telemetry monitoring were normal, he was discharged.  However at this time, on presentation he was found to be in A-fib with rapid rates, and admitted on diltiazem drip.    New onset paroxysmal atrial fibrillation with RVR Admitted on diltiazem drip.  Carvedilol  resumed, and converted to sinus rhythm.  CHA2DS2-Vasc 1 for HTN; given age, aspirin only recommended.  Echo showed severe LVH due to OHS and OSA but no valvular disease and normal EF.  UDS positive for cocaine, counseled on strict avoidance.   Presumed sleep apnea and obesity hypoventilation syndrome Strongly recommend follow-up for presumed sleep apnea.               The [place]  Controlled Substances Registry was reviewed for this patient prior to discharge.  Consultants: None Procedures performed: Echo   Disposition: Home Diet recommendation:  Discharge Diet Orders (From admission, onward)     Start     Ordered   07/13/23 0000  Diet - low sodium heart healthy        07/13/23 1711             DISCHARGE MEDICATION: Allergies as of 07/13/2023       Reactions   Crab  (diagnostic) Diarrhea, Nausea And Vomiting   Reaction only to crab legs         Medication List     STOP taking these medications    ibuprofen  800 MG tablet Commonly known as: ADVIL        TAKE these medications    amLODipine  10 MG tablet Commonly known as: NORVASC  Take 1 tablet (10 mg total) by mouth daily.   aspirin EC 81 MG tablet Take 1 tablet (81 mg total) by mouth daily. Swallow whole.   carvedilol  25 MG tablet Commonly known as: COREG  Take 1 tablet (25 mg total) by mouth 2 (two) times daily with a meal.   hydrALAZINE  50 MG tablet Commonly known as: APRESOLINE  Take 1 tablet (50 mg total) by mouth 3 (three) times daily.        Follow-up Information     Marius Siemens, NP. Schedule an appointment as soon as possible for a visit in 1 week(s).   Specialty: Internal Medicine Contact information: [address] [place] [address] [phone]                 Discharge Instructions     Diet - low sodium heart healthy   Complete by: As directed    Discharge instructions   Complete by: As directed    **IMPORTANT DISCHARGE INSTRUCTIONS**   From Dr. Darlyn Eke: You were admitted for atrial fibrillation  "  Atrial fibrillation" is an abnormal rhythm of the heart. It happens with the heart's electricity breaks down.  It can start and stop abruptly, and cause very fast heart rates  Here, we gave you two medicines to slow the heart rate, one called diltiazem, and the other (the one that really worked) your carvedilol /Coreg   CONTINUE COREG  This medicine slows the heart and you should take it every day and avoid missing doses  Also, make sure sleep apnea is treated Avoid salt in your diet Avoid alcohol and cocaine or amphetamine, all of which will definitely spark atrial fibrillation  Lastly, talk to your doctor about Ozempic or Wegovy  I have sent a message to the Cardiology (heart doctors) department to schedule you for an appointment    Increase activity slowly   Complete by: As directed    No wound care   Complete by: As directed        Discharge Exam: Filed Weights   07/12/23 1435  Weight: (!) 252.9 kg    General: Pt is alert, awake, not in acute distress, he has daytime sleepiness Cardiovascular: RRR, nl S1-S2, no murmurs appreciated.   Severe brawny change to bilateral legs from chronic edema, no cellulitis. Respiratory: Normal respiratory rate and rhythm.  CTAB without rales or wheezes. Abdominal: Abdomen soft and non-tender.  No distension or HSM.   Neuro/Psych: Strength symmetric in upper and lower extremities.  Judgment and insight appear normal.   Condition at discharge: fair  The results of significant diagnostics from this hospitalization (including imaging, microbiology, ancillary and laboratory) are listed below for reference.   Imaging Studies: ECHOCARDIOGRAM COMPLETE Result Date: 07/13/2023    ECHOCARDIOGRAM REPORT   Patient Name:   Jared Mccoy Date of Exam: 07/13/2023 Medical Rec #:  [id number]       Height:       72.0 in Accession #:    [id number]      Weight:       557.5 lb Date of Birth:  12-Apr-1990        BSA:          3.294 m Patient Age:    33 years        BP:           178/109 mmHg Patient Gender: M               HR:           70 bpm. Exam Location:  Inpatient Procedure: 2D Echo (Both Spectral and Color Flow Doppler were utilized during            procedure). Indications:    Atrial Fibrillation  History:        Patient has no prior history of Echocardiogram examinations.                 Risk Factors:Sleep Apnea.  Sonographer:    Dione Franks RDCS Referring Phys: [id number] Arne Langdon IMPRESSIONS  1. Left ventricular ejection fraction, by estimation, is 65 to 70%. The left ventricle has normal function. Left ventricular endocardial border not optimally defined to evaluate regional wall motion. There is severe left ventricular hypertrophy. Left ventricular diastolic parameters were normal.  2. RV  not well visualized, grossly appears normal in size and function. . Right ventricular systolic function was not well visualized. The right ventricular size is not well visualized. Tricuspid regurgitation signal is inadequate for assessing PA pressure.  3. The mitral valve was not well visualized. No evidence of mitral  valve regurgitation. No evidence of mitral stenosis.  4. The aortic valve was not well visualized. Aortic valve regurgitation is not visualized. No aortic stenosis is present.  5. Aortic dilatation noted. There is mild dilatation of the ascending aorta, measuring 39 mm.  6. The inferior vena cava is dilated in size with >50% respiratory variability, suggesting right atrial pressure of 8 mmHg. FINDINGS  Left Ventricle: Left ventricular ejection fraction, by estimation, is 65 to 70%. The left ventricle has normal function. Left ventricular endocardial border not optimally defined to evaluate regional wall motion. Definity contrast agent was given IV to delineate the left ventricular endocardial borders. The left ventricular internal cavity size was normal in size. There is severe left ventricular hypertrophy. Left ventricular diastolic parameters were normal. Right Ventricle: RV not well visualized, grossly appears normal in size and function. The right ventricular size is not well visualized. Right vetricular wall thickness was not well visualized. Right ventricular systolic function was not well visualized.  Tricuspid regurgitation signal is inadequate for assessing PA pressure. Left Atrium: Left atrial size was normal in size. Right Atrium: Right atrial size was normal in size. Pericardium: There is no evidence of pericardial effusion. Mitral Valve: The mitral valve was not well visualized. No evidence of mitral valve regurgitation. No evidence of mitral valve stenosis. Tricuspid Valve: The tricuspid valve is normal in structure. Tricuspid valve regurgitation is not demonstrated. No evidence of  tricuspid stenosis. Aortic Valve: The aortic valve was not well visualized. Aortic valve regurgitation is not visualized. No aortic stenosis is present. Aortic valve mean gradient measures 3.2 mmHg. Aortic valve peak gradient measures 6.7 mmHg. Aortic valve area, by VTI measures 4.74 cm. Pulmonic Valve: The pulmonic valve was not well visualized. Pulmonic valve regurgitation is not visualized. No evidence of pulmonic stenosis. Aorta: The aortic root is normal in size and structure and aortic dilatation noted. There is mild dilatation of the ascending aorta, measuring 39 mm. Venous: The inferior vena cava is dilated in size with greater than 50% respiratory variability, suggesting right atrial pressure of 8 mmHg. IAS/Shunts: No atrial level shunt detected by color flow Doppler.  LEFT VENTRICLE PLAX 2D LVIDd:         6.50 cm   Diastology LVIDs:         3.90 cm   LV e' medial:    10.90 cm/s LV PW:         1.60 cm   LV E/e' medial:  9.4 LV IVS:        1.40 cm   LV e' lateral:   13.40 cm/s LVOT diam:     2.50 cm   LV E/e' lateral: 7.7 LV SV:         114 LV SV Index:   35 LVOT Area:     4.91 cm  RIGHT VENTRICLE             IVC RV S prime:     18.80 cm/s  IVC diam: 2.60 cm TAPSE (M-mode): 3.0 cm LEFT ATRIUM           Index LA diam:      5.70 cm 1.73 cm/m LA Vol (A4C): 97.1 ml 29.48 ml/m  AORTIC VALVE AV Area (Vmax):    4.38 cm AV Area (Vmean):   4.31 cm AV Area (VTI):     4.74 cm AV Vmax:           128.95 cm/s AV Vmean:  81.370 cm/s AV VTI:            0.240 m AV Peak Grad:      6.7 mmHg AV Mean Grad:      3.2 mmHg LVOT Vmax:         115.00 cm/s LVOT Vmean:        71.500 cm/s LVOT VTI:          0.232 m LVOT/AV VTI ratio: 0.96  AORTA Ao Root diam: 3.80 cm Ao Asc diam:  3.90 cm MITRAL VALVE MV Area (PHT): 3.68 cm     SHUNTS MV Decel Time: 206 msec     Systemic VTI:  0.23 m MV E velocity: 103.00 cm/s  Systemic Diam: 2.50 cm MV A velocity: 88.60 cm/s MV E/A ratio:  1.16 Armida Lander MD Electronically signed  by Armida Lander MD Signature Date/Time: 07/13/2023/4:39:59 PM    Final    DG Chest Portable 1 View Result Date: 07/12/2023 CLINICAL DATA:  SOB EXAM: PORTABLE CHEST 1 VIEW COMPARISON:  06/29/2023 FINDINGS: Cardiac silhouette appears prominent. No pneumonia or pulmonary edema. No pneumothorax or pleural effusion. Osseous structures intact. IMPRESSION: Enlarged cardiac silhouette. Electronically Signed   By: Sydell Eva M.D.   On: 07/12/2023 08:31   DG Chest 2 View Result Date: 06/29/2023 CLINICAL DATA:  Dyspnea EXAM: CHEST - 2 VIEW COMPARISON:  04/06/2023 FINDINGS: Lungs are well expanded, symmetric, and clear. No pneumothorax or pleural effusion. Cardiac size is mildly enlarged, unchanged. Pulmonary vascularity is normal. Osseous structures are age-appropriate. No acute bone abnormality. IMPRESSION: 1. Stable mild cardiomegaly. Electronically Signed   By: Worthy Heads M.D.   On: 06/29/2023 02:33    Microbiology: Results for orders placed or performed during the hospital encounter of 04/06/23  Resp panel by RT-PCR (RSV, Flu A&B, Covid) Anterior Nasal Swab     Status: Abnormal   Collection Time: 04/06/23  2:25 PM   Specimen: Anterior Nasal Swab  Result Value Ref Range Status   SARS Coronavirus 2 by RT PCR NEGATIVE NEGATIVE Final    Comment: (NOTE) SARS-CoV-2 target nucleic acids are NOT DETECTED.  The SARS-CoV-2 RNA is generally detectable in upper respiratory specimens during the acute phase of infection. The lowest concentration of SARS-CoV-2 viral copies this assay can detect is 138 copies/mL. A negative result does not preclude SARS-Cov-2 infection and should not be used as the sole basis for treatment or other patient management decisions. A negative result may occur with  improper specimen collection/handling, submission of specimen other than nasopharyngeal swab, presence of viral mutation(s) within the areas targeted by this assay, and inadequate number of viral copies(<138  copies/mL). A negative result must be combined with clinical observations, patient history, and epidemiological information. The expected result is Negative.  Fact Sheet for Patients:  BloggerCourse.com  Fact Sheet for Healthcare Providers:  SeriousBroker.it  This test is no t yet approved or cleared by the [place]  FDA and  has been authorized for detection and/or diagnosis of SARS-CoV-2 by FDA under an Emergency Use Authorization (EUA). This EUA will remain  in effect (meaning this test can be used) for the duration of the COVID-19 declaration under Section 564(b)(1) of the Act, 21 U.S.C.section 360bbb-3(b)(1), unless the authorization is terminated  or revoked sooner.       Influenza A by PCR POSITIVE (A) NEGATIVE Final   Influenza B by PCR NEGATIVE NEGATIVE Final    Comment: (NOTE) The Xpert Xpress SARS-CoV-2/FLU/RSV plus assay is intended as an aid in the diagnosis  of influenza from Nasopharyngeal swab specimens and should not be used as a sole basis for treatment. Nasal washings and aspirates are unacceptable for Xpert Xpress SARS-CoV-2/FLU/RSV testing.  Fact Sheet for Patients: BloggerCourse.com  Fact Sheet for Healthcare Providers: SeriousBroker.it  This test is not yet approved or cleared by the [place]  FDA and has been authorized for detection and/or diagnosis of SARS-CoV-2 by FDA under an Emergency Use Authorization (EUA). This EUA will remain in effect (meaning this test can be used) for the duration of the COVID-19 declaration under Section 564(b)(1) of the Act, 21 U.S.C. section 360bbb-3(b)(1), unless the authorization is terminated or revoked.     Resp Syncytial Virus by PCR NEGATIVE NEGATIVE Final    Comment: (NOTE) Fact Sheet for Patients: BloggerCourse.com  Fact Sheet for Healthcare  Providers: SeriousBroker.it  This test is not yet approved or cleared by the [place]  FDA and has been authorized for detection and/or diagnosis of SARS-CoV-2 by FDA under an Emergency Use Authorization (EUA). This EUA will remain in effect (meaning this test can be used) for the duration of the COVID-19 declaration under Section 564(b)(1) of the Act, 21 U.S.C. section 360bbb-3(b)(1), unless the authorization is terminated or revoked.  Performed at [facility], [address], [place], [place] [address]   Culture, blood (Routine X 2) w Reflex to ID Panel     Status: Abnormal   Collection Time: 04/06/23  6:05 PM   Specimen: BLOOD  Result Value Ref Range Status   Specimen Description   Final    BLOOD SITE NOT SPECIFIED Performed at [facility], [address], [place], [place] [address]    Special Requests   Final    BOTTLES DRAWN AEROBIC AND ANAEROBIC Blood Culture adequate volume Performed at [facility], [address] [place], [place] [address]    Culture  Setup Time   Final    GRAM POSITIVE COCCI IN CHAINS IN BOTH AEROBIC AND ANAEROBIC BOTTLES CRITICAL RESULT CALLED TO, READ BACK BY AND VERIFIED WITH: PHARMD ANH PHAM ON 04/07/23 @ 1347 BY DRT    Culture (A)  Final    STREPTOCOCCUS GROUP G SUSCEPTIBILITIES PERFORMED ON PREVIOUS CULTURE WITHIN THE LAST 5 DAYS. Performed at [facility], [address] [place], [place] [address]    Report Status 04/09/2023 FINAL  Final  Culture, blood (Routine X 2) w Reflex to ID Panel     Status: Abnormal   Collection Time: 04/06/23  6:05 PM   Specimen: BLOOD  Result Value Ref Range Status   Specimen Description   Final    BLOOD SITE NOT SPECIFIED Performed at [facility], [address], [place], [place] [address]    Special Requests   Final    BOTTLES DRAWN AEROBIC AND ANAEROBIC Blood Culture adequate volume Performed  at [facility], [address], [place], [place] [address]    Culture  Setup Time   Final    GRAM POSITIVE COCCI IN CHAINS IN BOTH AEROBIC AND ANAEROBIC BOTTLES CRITICAL RESULT CALLED TO, READ BACK BY AND VERIFIED WITH: PHARMD ANH PHAM ON 04/07/23 @ 1347 BY DRT Performed at [facility], [address] [place], [place] [address]    Culture STREPTOCOCCUS GROUP G (A)  Final   Report Status 04/09/2023 FINAL  Final   Organism ID, Bacteria STREPTOCOCCUS GROUP G  Final      Susceptibility   Streptococcus group g - MIC*  CLINDAMYCIN  RESISTANT Resistant     AMPICILLIN <=0.25 SENSITIVE Sensitive     ERYTHROMYCIN >=8 RESISTANT Resistant     VANCOMYCIN  0.5 SENSITIVE Sensitive     CEFTRIAXONE  <=0.12 SENSITIVE Sensitive     LEVOFLOXACIN 0.5 SENSITIVE Sensitive     PENICILLIN  <=0.06 SENSITIVE Sensitive     * STREPTOCOCCUS GROUP G  Blood Culture ID Panel (Reflexed)     Status: Abnormal   Collection Time: 04/06/23  6:05 PM  Result Value Ref Range Status   Enterococcus faecalis NOT DETECTED NOT DETECTED Final   Enterococcus Faecium NOT DETECTED NOT DETECTED Final   Listeria monocytogenes NOT DETECTED NOT DETECTED Final   Staphylococcus species NOT DETECTED NOT DETECTED Final   Staphylococcus aureus (BCID) NOT DETECTED NOT DETECTED Final   Staphylococcus epidermidis NOT DETECTED NOT DETECTED Final   Staphylococcus lugdunensis NOT DETECTED NOT DETECTED Final   Streptococcus species DETECTED (A) NOT DETECTED Final    Comment: Not Enterococcus species, Streptococcus agalactiae, Streptococcus pyogenes, or Streptococcus pneumoniae. CRITICAL RESULT CALLED TO, READ BACK BY AND VERIFIED WITH: PHARMD ANH PHAM ON 04/07/23 @ 1345 BY DRT    Streptococcus agalactiae NOT DETECTED NOT DETECTED Final   Streptococcus pneumoniae NOT DETECTED NOT DETECTED Final   Streptococcus pyogenes NOT DETECTED NOT DETECTED Final   A.calcoaceticus-baumannii NOT DETECTED NOT DETECTED Final    Bacteroides fragilis NOT DETECTED NOT DETECTED Final   Enterobacterales NOT DETECTED NOT DETECTED Final   Enterobacter cloacae complex NOT DETECTED NOT DETECTED Final   Escherichia coli NOT DETECTED NOT DETECTED Final   Klebsiella aerogenes NOT DETECTED NOT DETECTED Final   Klebsiella oxytoca NOT DETECTED NOT DETECTED Final   Klebsiella pneumoniae NOT DETECTED NOT DETECTED Final   Proteus species NOT DETECTED NOT DETECTED Final   Salmonella species NOT DETECTED NOT DETECTED Final   Serratia marcescens NOT DETECTED NOT DETECTED Final   Haemophilus influenzae NOT DETECTED NOT DETECTED Final   Neisseria meningitidis NOT DETECTED NOT DETECTED Final   Pseudomonas aeruginosa NOT DETECTED NOT DETECTED Final   Stenotrophomonas maltophilia NOT DETECTED NOT DETECTED Final   Candida albicans NOT DETECTED NOT DETECTED Final   Candida auris NOT DETECTED NOT DETECTED Final   Candida glabrata NOT DETECTED NOT DETECTED Final   Candida krusei NOT DETECTED NOT DETECTED Final   Candida parapsilosis NOT DETECTED NOT DETECTED Final   Candida tropicalis NOT DETECTED NOT DETECTED Final   Cryptococcus neoformans/gattii NOT DETECTED NOT DETECTED Final    Comment: Performed at [facility], [address], [place], [place] [address]  MRSA Next Gen by PCR, Nasal     Status: None   Collection Time: 04/07/23 11:27 AM   Specimen: Nasal Mucosa; Nasal Swab  Result Value Ref Range Status   MRSA by PCR Next Gen NOT DETECTED NOT DETECTED Final    Comment: (NOTE) The GeneXpert MRSA Assay (FDA approved for NASAL specimens only), is one component of a comprehensive MRSA colonization surveillance program. It is not intended to diagnose MRSA infection nor to guide or monitor treatment for MRSA infections. Test performance is not FDA approved in patients less than 60 years old. Performed at [facility], [address], [place], [place] [address]   Respiratory (~20 pathogens) panel by PCR      Status: Abnormal   Collection Time: 04/07/23  3:02 PM   Specimen: Nasopharyngeal Swab; Respiratory  Result Value Ref Range Status   Adenovirus NOT DETECTED NOT DETECTED Corrected   Coronavirus 229E NOT DETECTED NOT DETECTED Corrected  Comment: (NOTE) The Coronavirus on the Respiratory Panel, DOES NOT test for the novel  Coronavirus (2019 nCoV) CORRECTED ON 02/24 AT 2001: PREVIOUSLY REPORTED AS NOT DETECTED    Coronavirus HKU1 NOT DETECTED NOT DETECTED Corrected   Coronavirus NL63 NOT DETECTED NOT DETECTED Corrected   Coronavirus OC43 NOT DETECTED NOT DETECTED Corrected   Metapneumovirus NOT DETECTED NOT DETECTED Corrected   Rhinovirus / Enterovirus NOT DETECTED NOT DETECTED Corrected   Influenza A EQUIVOCAL (A) NOT DETECTED Corrected    Comment: Referred to [facility] in [place], [place] for serotyping.   Influenza B NOT DETECTED NOT DETECTED Corrected   Parainfluenza Virus 1 NOT DETECTED NOT DETECTED Corrected   Parainfluenza Virus 2 NOT DETECTED NOT DETECTED Corrected   Parainfluenza Virus 3 NOT DETECTED NOT DETECTED Corrected   Parainfluenza Virus 4 NOT DETECTED NOT DETECTED Corrected   Respiratory Syncytial Virus NOT DETECTED NOT DETECTED Corrected   Bordetella pertussis NOT DETECTED NOT DETECTED Corrected   Bordetella Parapertussis NOT DETECTED NOT DETECTED Corrected   Chlamydophila pneumoniae NOT DETECTED NOT DETECTED Corrected   Mycoplasma pneumoniae NOT DETECTED NOT DETECTED Corrected    Comment: Performed at [facility], [address], [place], [place] [address]    Labs: CBC: Recent Labs  Lab 07/12/23 0816 07/12/23 0845  WBC 8.2  --   NEUTROABS 5.2  --   HGB 11.6* 13.6  HCT 39.5 40.0  MCV 79.3*  --   PLT 256  --    Basic Metabolic Panel: Recent Labs  Lab 07/12/23 0816 07/12/23 0845 07/13/23 0943  NA 138 142 139  K 2.8* 2.6* 3.2*  CL 98  --  100  CO2 30  --  31  GLUCOSE 123*  --  96  BUN 13  --  13  CREATININE 1.33*  --  1.24  CALCIUM  8.6*  --  8.2*  MG 1.8  --   --    Liver Function Tests: No results for input(s): "AST", "ALT", "ALKPHOS", "BILITOT", "PROT", "ALBUMIN" in the last 168 hours. CBG: No results for input(s): "GLUCAP" in the last 168 hours.  Discharge time spent: approximately 45 minutes spent on discharge counseling, evaluation of patient on day of discharge, and coordination of discharge planning with nursing, social work, pharmacy and case management  Signed: Ephriam Hashimoto, MD [facility] 07/13/2023

## 2023-07-14 ENCOUNTER — Telehealth: Payer: Self-pay

## 2023-07-14 NOTE — Transitions of Care (Post Inpatient/ED Visit) (Signed)
   07/14/2023  Name: Jared Mccoy MRN: [id number] DOB: [birth year]  Today's TOC FU Call Status: Today's TOC FU Call Status:: Unsuccessful Call (1st Attempt) Unsuccessful Call (1st Attempt) Date: 07/14/23  Attempted to reach the patient regarding the most recent Inpatient/ED visit.  Follow Up Plan: Additional outreach attempts will be made to reach the patient to complete the Transitions of Care (Post Inpatient/ED visit) call.   Signature  Burnett Carson, RN

## 2023-07-15 ENCOUNTER — Telehealth: Payer: Self-pay

## 2023-07-15 NOTE — Telephone Encounter (Addendum)
 From [facility] call:  He has an appointment with you [phone].   he said he needs lasix . I explained to him that they did not put him on it when he was in the hospital and Moira Andrews would want to see him prior to starting it He said she was going to put him on it at one point but it was never started. I told him that I would let her know of this request.   He also said he has wounds on the back of his legs and the hospital does nothing about them but put gauze on them

## 2023-07-15 NOTE — Transitions of Care (Post Inpatient/ED Visit) (Signed)
   07/15/2023  Name: Jared Mccoy MRN: [id number] DOB: [birth year]  Today's TOC FU Call Status: Today's TOC FU Call Status:: Successful TOC FU Call Completed Unsuccessful Call (1st Attempt) Date: 07/14/23 [facility] FU Call Complete Date: 07/15/23 Patient's Name and Date of Birth confirmed.  Transition Care Management Follow-up Telephone Call Date of Discharge: 07/13/23 Discharge Facility: [facility]) Type of Discharge: Inpatient Admission Primary Inpatient Discharge Diagnosis:: new onset a-fib How have you been since you were released from the hospital?: Better Any questions or concerns?: Yes Patient Questions/Concerns:: he said he needs lasix .  I explained to him that they did not put him on it when he was in the hospital and Moira Andrews would want to see him prior to starting it  He said she was going to put him on it at one point but it was never started. I told him that I would let her know of this request. Patient Questions/Concerns Addressed: Notified Provider of Patient Questions/Concerns  Items Reviewed: Did you receive and understand the discharge instructions provided?: Yes Medications obtained,verified, and reconciled?: Yes (Medications Reviewed) (he confirmed that he has all of his medications) Any new allergies since your discharge?: No Dietary orders reviewed?: Yes Type of Diet Ordered:: heart healthy, low sodium Do you have support at home?: Yes  Medications Reviewed Today: Medications Reviewed Today     Reviewed by Burnett Carson, RN (Case Manager) on 07/15/23 at 1126  Med List Status: <None>   Medication Order Taking? Sig Documenting Provider Last Dose Status Informant  amLODipine  (NORVASC ) 10 MG tablet [id number] No Take 1 tablet (10 mg total) by mouth daily. Marius Siemens, NP 07/11/2023 Morning Active Self, Pharmacy Records  aspirin EC 81 MG tablet [id number]  Take 1 tablet (81 mg total) by mouth daily. Swallow whole. Ephriam Hashimoto, MD  Active   carvedilol   (COREG ) 25 MG tablet [id number] No Take 1 tablet (25 mg total) by mouth 2 (two) times daily with a meal. Marius Siemens, NP 07/11/2023 Bedtime Active Self, Pharmacy Records  hydrALAZINE  (APRESOLINE ) 50 MG tablet [id number] No Take 1 tablet (50 mg total) by mouth 3 (three) times daily. Marius Siemens, NP 07/11/2023 Bedtime Active Self, Pharmacy Records            Home Care and Equipment/Supplies: Were Home Health Services Ordered?: No Any new equipment or medical supplies ordered?: No  Functional Questionnaire: Do you need assistance with bathing/showering or dressing?: No Do you need assistance with meal preparation?: No Do you need assistance with eating?: No Do you have difficulty maintaining continence: No Do you need assistance with getting out of bed/getting out of a chair/moving?: No Do you have difficulty managing or taking your medications?: No  Follow up appointments reviewed: PCP Follow-up appointment confirmed?: Yes Date of PCP follow-up appointment?: 07/29/23 Follow-up Provider: Otha Blight, NP Specialist Hospital Follow-up appointment confirmed?: Yes Date of Specialist follow-up appointment?: 07/22/23 Follow-Up Specialty Provider:: A-fib clinic Do you need transportation to your follow-up appointment?: No Do you understand care options if your condition(s) worsen?: Yes-patient verbalized understanding    SIGNATURE Burnett Carson, RN

## 2023-07-19 DIAGNOSIS — G4719 Other hypersomnia: Secondary | ICD-10-CM

## 2023-07-19 NOTE — Procedures (Signed)
 [facility] [address] [place], [place] [address] Tel: [phone]   Fax: [phone]  Split Night Interpretation  Patient Name:  Jared Mccoy, Jared Mccoy Date:  07/11/2023 Referring Physician:  Boyd Cabal, NP  Indications for Polysomnography The patient is a 33 year old Male who is 6' and weighs 540.0 lbs.  His BMI equals 73.9.  A diagnostic polysomnogram was performed to evaluate for -.  After 143.0 minutes of sleep time the patient exhibited sufficient respiratory events qualifying him for a CPAP trial which was then initiated.    No medications were reported taken during the night.  No Data.   Polysomnogram Data A full night polysomnogram was performed recording the standard physiologic parameters including EEG, EOG, EMG, EKG, nasal and oral airflow.  Respiratory parameters of chest and abdominal movements are recorded with Piezo-Crystal motion transducers.  Oxygen saturation was recorded by pulse oximetry.    Sleep Architecture The total recording time of the diagnostic portion of the study was 168.7 minutes.  The total sleep time was 143.0 minutes.  During the diagnostic portion of the study, the patient spent 15.7% of total sleep time in Stage N1, 66.8% in Stage N2, 0.0% in Stages N3, and 17.5% in REM.   Sleep latency was 0.0 minutes.  REM latency was 56.2 minutes.  Sleep Efficiency was 84.8%.  Wake after Sleep Onset time was 26.0 minutes.   At 01:54:15 AM the patient was placed on PAP treatment and was titrated at pressures ranging from 8* cm/H20 with supplemental oxygen at - up to 24/20/0** cm/H20 with supplemental oxygen at O2: 4.  The total recording time of the treatment portion of the study was 193.1 minutes.  The total sleep time was 171.5 minutes.  During the treatment portion of the study, the patient spent 3.5% of total sleep time in Stage N1, 53.4% in Stage N2, 10.8% in Stages N3, and 32.4% in REM.   Sleep latency was 2.0  minutes.  REM latency was 6.0 minutes.  Sleep Efficiency was 88.8%.  Wake after Sleep Onset time was 19.5 minutes.  Respiratory Events During the diagnostic portion of the study, the polysomnogram revealed a presence of 10 obstructive, - central, and - mixed apneas resulting in an Apnea index of 4.2 events per hour.  There were 281 hypopneas (>=3% desaturation and/or arousal) resulting in an Apnea\Hypopnea Index (AHI >=3% desaturation and/or arousal) of 122.1 events per hour.  There were 203 hypopneas (>=4% desaturation) resulting in an Apnea\Hypopnea Index (AHI >=4% desaturation) of 89.4 events per hour.  There were 43 Respiratory Effort Related Arousals resulting in a RERA index of 18.0 events per hour. The Respiratory Disturbance Index is 140.1 events per hour.  The snore index was - events per hour.  Mean oxygen saturation was 84.0%.  The lowest oxygen saturation during sleep was 55.0%.  Time spent <=88% oxygen saturation was 116.5 minutes (74.6%).  End Tidal CO2 during sleep ranged from - to - mmHg. End Tidal CO2 was greater than 50 mmHg for - minutes and greater than 55 mmHg for - minutes.  During the treatment portion of the study, the polysomnogram revealed a presence of - obstructive, - central, and - mixed apneas resulting in an Apnea index of - events per hour.  There were 96 hypopneas (>=3% desaturation and/or arousal) resulting in an Apnea\Hypopnea Index (AHI >=3% desaturation and/or arousal) of 33.6 events per hour.  There were 68 hypopneas (>=4% desaturation) resulting in an Apnea\Hypopnea Index (AHI >=4%  desaturation) of 23.8 events per hour.  There were 13 Respiratory Effort Related Arousals resulting in a RERA index of 4.5 events per hour. The Respiratory Disturbance Index is 38.1 events per hour.  The snore index was - events per hour.  Mean oxygen saturation was 82.2%.  The lowest oxygen saturation during sleep was 59.0%.  Time spent <=88% oxygen saturation was 169.8 minutes (89.6%).  Limb  Activity During the diagnostic portion of the study, there were - limb movements recorded.  Of this total, - were classified as PLMs.  Of the PLMs, - were associated with arousals.  The Limb Movement index was - per hour while the PLM index was - per hour.  During the treatment portion of the study, there were - limb movements recorded.  Of this total, - were classified as PLMs.  Of the PLMs, - were associated with arousals.  The Limb Movement index was - per hour while the PLM index was - per hour.  Cardiac Summary During the diagnostic portion of the study, the average pulse rate was 89.8 bpm.  The minimum pulse rate was 68.0 bpm while the maximum pulse rate was 115.0 bpm.  During the treatment portion of the study, the average pulse rate was 87.9 bpm.  The minimum pulse rate was 73.0 bpm while the maximum pulse rate was 112.0 bpm.   Comment: Severe obstructive sleep apnea, AHI (4%) 89.4/hr. Snoring with oxygen desaturation to a nadir of 55%. Supplemental O2 was titrated to 4L per protocol due to sustained saturation 88% or less. CPAP provided inadequate control and was changed to bilevel titration. Final BIPAP 24/20, PS 0 with residual AHI (4%) 2.4.//hr and with O2 4l, minimum O2 saturation 85% with mean 88% indicating p4ersistent nocturnal hypoxemia.  Diagnosis: Obstructive sleep apnea, Nocturnal Hypoxemia  Recommendations: BIPAP 24/20, PS 2 or autoBIPAP. Supplemental O2 5L.    This study was personally reviewed and electronically signed by: [employer], MD Accredited Board Certified in Sleep Medicine Date/Time: 07/19/23  12:19       Split Night Report  Patient Name: Jared Mccoy, Jared Mccoy Date: 07/11/2023  Date of Birth: [birth year] Study Type: Split Night  Age: 33 year MRN #: [id number]  Sex: Male Interpreting Physician: [employer] [id number]  Height: 6' Referring Physician: Boyd Cabal, NP  Weight: 540.0 lbs Recording Tech: Iva Mariner RPSGT RST  BMI: 73.9 Scoring  Tech: Roosvelt Colla RRT RPSGT RST  ESS: 18 Neck Size: 19  Mask Type Simplus FFM Final Pressure: BIPAP pressure of 24/20 CM H2O  Mask Size: Large Supplemental O2: 4   Study Overview  DIAGNOSTIC TREATMENT  Lights Off: 11:05:08 PM Lights Off: 01:53:50 AM  Lights On: 01:53:50 AM Lights On: 05:06:58 AM  Time in Bed: 168.7 min. Time in Bed: 193.1 min.  Total Sleep Time: 143.0 min. Total Sleep Time: 171.5 min.  Sleep Efficiency: 84.8% Sleep Efficiency: 88.8%  Sleep Latency: 0.0 min. Sleep Latency: 2.0 min.  REM Latency from Sleep Onset: 56.2 min. REM Latency from Sleep Onset: 6.0 min.  Wake After Sleep Onset: 26.0 min. Wake After Sleep Onset: 19.5 min.   DIAGNOSTIC TREATMENT   Count Index  Count Index  Awakenings: 13 5.5 Awakenings: 6 2.1  Arousals: 207 86.9 Arousals: 42 14.7  AHI (>=3% Desat and/or Ar.): 291 122.1 AHI (>=3% Desat and/or Ar.): 96 33.6  AHI (>=4% Desat): 213 89.4 AHI (>=4% Desat): 68 23.8   Limb Movements: - - Limb Movements: - -  Snore: - - Snore: - -  Desaturations: 279 117.5 Desaturations: 104 36.4  Minimum SpO2 TST: 55.0% Minimum SpO2 TST: 59.0%    Sleep Architecture   DIAGNOSTIC TREATMENT ENTIRE NIGHT  Stages Time (mins) % Sleep Time Time (mins) % Sleep Time Time (mins) % Sleep Time  Wake 26.0  22.0  48.0   Stage N1 22.5 15.7% 6.0 3.5% 28.5 9.1%  Stage N2 95.5 66.8% 91.5 53.4% 187.0 59.5%  Stage N3 0.0 0.0% 18.5 10.8% 18.5 5.9%  REM 25.0 17.5% 55.5 32.4% 80.5 25.6%   Arousal Summary   DIAGNOSTIC TREATMENT   NREM REM TST Index NREM REM TST Index  Respiratory Ar. 182 1 183 76.8 19 6 25  8.7  PLM Ar. - - - - - - - -  Isolated Limb Movement Ar. - - - - - - - -  Snore Ar. - - - - - - - -  Spontaneous Ar. 24 - 24 10.1 16 1 17  5.9  Total Ar. 206 1 207 86.9 35 7 42 14.7    Respiratory Summary  DIAGNOSTIC By Sleep Stage By Body Position Total   NREM REM Supine Non-Supine   Time (min) 118.0 25.0 35.5 107.5 143.0         Obstructive Apnea 5 5 - 10 10  Mixed  Apnea - - - - -  Central Apnea - - - - -  Total Apneas 5 5 - 10 10  Total Apnea Index 2.5 12.0 - 5.6 4.2         Hypopneas (>=3% Desat and/or Ar.) 236 45 80 201 281  AHI (>=3% Desat and/or Ar.) 122.5 120.0 135.2 117.8 122.1         Hypopneas (>=4% Desat) 160 43 67 136 203  AHI (>=4% Desat) 83.9 115.2 113.2 81.5 89.4          RERAs 43 - 4 39 43  RERA Index 21.9 - 6.8 21.8 18.0         RDI 144.4 120.0 142.0 139.5 140.1    TREATMENT By Sleep Stage By Body Position Total   NREM REM Supine Non-Supine   Time (min) 116.0 55.5 171.5 - 171.5         Obstructive Apnea - - - - -  Mixed Apnea - - - - -  Central Apnea - - - - -  Total Apneas - - - - -  Total Apnea Index - - - - -         Hypopneas (>=3% Desat and/or Ar.) 30 66 96 - 96  AHI (>=3% Desat and/or Ar.) 15.5 71.4 33.6 - 33.6         Hypopneas (>=4% Desat) 18 50 68 - 68  AHI (>=4% Desat) 9.3 54.1 23.8 - 23.8          RERAs 13 - 13 - 13  RERA Index 6.7 - 4.5 - 4.5         RDI 22.2 71.4 38.1 - 38.1    Respiratory Event Durations   DIAGNOSTIC TREATMENT  Apnea NREM REM NREM REM  Average (seconds) 14.3 19.6 - -  Maximum (seconds) 17.5 22.2 - -  Hypopnea      Average (seconds) 15.3 22.4 19.2 20.4  Maximum (seconds) 27.7 37.6 40.7 97.2    Limb Movement Summary   DIAGNOSTIC TREATMENT   Count Index Count Index  Isolated Limb Movements - - - -  Periodic Limb Movements (PLMs) - - - -  Total Limb Movements - - - -  Oxygen Saturation Summary   DIAGNOSTIC TREATMENT   Wake NREM REM TST Wake NREM REM TST  Average SpO2 86.9% 85.9% 73.4% 83.7% 85.4% 85.6% 74.3% 81.9%  Minimum SpO2 73.0% 60.0% 55.0% 55.0%  63.0% 72.0% 59.0% 59.0%   Maximum SpO2 94.0% 95.0% 92.0% 95.0%  94.0% 95.0% 91.0% 95.0%    DIAGNOSTIC Oxygen Saturation Distribution  Range (%) Time in range (min) Time in range (%)   90.0 - 100.0 16.8 10.7%  80.0 - 90.0 111.9 71.7%  70.0 - 80.0 16.5 10.5%  60.0 - 70.0 8.9 5.7%  50.0 - 60.0 1.1 0.7%  0.0 - 50.0  - -  Time Spent <=88% SpO2  Range (%) Time in range (min) Time in range (%)  0.0 - 88.0 116.5 74.6%      Count Index  Desaturations: 279 117.5   TREATMENT Oxygen Saturation Distribution  Range (%) Time in range (min) Time in range (%)   90.0 - 100.0 4.1 2.2%  80.0 - 90.0 129.6 68.4%  70.0 - 80.0 29.7 15.7%  60.0 - 70.0 24.3 12.8%  50.0 - 60.0 0.1 0.1%  0.0 - 50.0 - -  Time Spent <=88% SpO2  Range (%) Time in range (min) Time in range (%)  0.0 - 88.0 169.8 89.6%      Count Index  Desaturations: 104 36.4     Cardiac Summary   DIAGNOSTIC TREATMENT   Wake NREM REM Total Wake NREM REM Total  Average Pulse Rate (BPM) 94.5 90.1 86.1 89.8 95.1 86.3 88.8 87.9  Minimum Pulse Rate (BPM) 84.0 79.0 68.0 68.0 84.0 77.0 73.0 73.0  Maximum Pulse Rate (BPM) 115.0 105.0 100.0 115.0 112.0 102.0 103.0 112.0   Pulse Rate Distribution   DIAGNOSTIC  Range (bpm) Time in range (min) Time in range (%)  0.0 - 40.0 - -  40.0 - 60.0 - -  60.0 - 80.0 5.3 3.4%  80.0 - 100.0 147.4 94.4%  100.0 - 120.0 1.3 0.8%  120.0 - 140.0 - -  140.0 - 200.0 - -   TREATMENT  Range (bpm) Time in range (min) Time in range (%)  0.0 - 40.0 - -  40.0 - 60.0 - -  60.0 - 80.0 5.1 2.7%  80.0 - 100.0 180.8 95.3%  100.0 - 120.0 3.8 2.0%  120.0 - 140.0 - -  140.0 - 200.0 - -   EtCO2 Summary - Diagnostic  Stage Min (mmHg) Average (mmHg) Max (mmHg)  Wake - - -  NREM(1+2+3) - - -  REM - - -   EtCO2 Distribution:  Range (mmHg) Time in range (min) Time in range (%)  20.0 - 40.0 - -  40.0 - 50.0 - -  50.0 - 100.0 - -  55.0 - 100.0 - -  Excluded data <20.0 & >65.0 169.0 100.0%   Titration Summary  PAP Device PAP Level O2 Level Time (min) Wake (min) NREM (min) REM (min) Sleep Eff% OA# CA# MA# Hyp# (>=3%) AHI (>=3%) Hyp# (>=4%) AHI (>=%4) RERA RDI OSat <=88% (min) Min [employer] Ar. Index  - Off - 169.0 26.0 118.0 25.0 84.6% 10 - - 281 122.1 203  89.4 43  140.1  108.3 55.0 83.7 86.9  CPAP 8 -  14.0 2.0 6.0 6.0 85.7% - - - 20 100.0 19  95.0 -  100.0  11.4 62.0 81.5 30.0  CPAP 10 - 5.0 0.0 0.0 5.0 100.0% - - - 8 96.0 8  96.0 -  96.0  5.0 66.0 77.3 12.0  CPAP 12 - 8.0 0.0 0.0 8.0 100.0% - - - 16 120.0 16  120.0 -  120.0  8.0 59.0 68.9 -  CPAP 14 - 11.5 0.0 0.0 11.5 100.0% - - - 13 67.8 6  31.3 -  67.8  11.5 61.0 66.4 -  CPAP 16 - 11.0 1.5 0.0 9.5 86.4% - - - 9 56.8 5  31.6 -  56.8  9.5 61.0 69.6 -  CPAP 16 O2: 1 11.0 0.0 11.0 0.0 100.0% - - - 3 16.4 3  16.4 -  16.4  11.0 72.0 77.0 10.9  CPAP 17 O2: 1 4.5 0.0 2.5 2.0 100.0% - - - 2 26.7 1  13.3 -  26.7  4.5 74.0 77.1 -  CPAP 17 O2: 2 18.5 0.0 16.5 2.0 100.0% - - - - - -  - -  -  18.5 80.0 83.7 -  CPAP 17 O2: 3 20.5 0.5 20.0 0.0 97.6% - - - 5 15.0 3  9.0 -  15.0  19.2 84.0 86.3 12.0  CPAP 17 O2: 4 22.0 0.0 22.0 0.0 100.0% - - - 2 5.5 -  - 5  19.1  18.0 86.0 87.9 16.4  CPAP 19 O2: 4 5.0 0.0 2.5 2.5 100.0% - - - 6 72.0 4  48.0 -  72.0  4.0 74.0 85.8 48.0  CPAP 20 O2: 4 9.0 0.0 0.0 9.0 100.0% - - - 5 33.3 2  13.3 -  33.3  8.9 79.0 84.3 6.7  Bilevel 22/18/0 O2: 4 4.5 0.5 4.0 0.0 88.9% - - - 1 15.0 -  - 2  45.0  3.6 81.0 86.9 45.0  Bilevel 23/19/0 O2: 4 6.5 0.0 6.5 0.0 100.0% - - - 5 46.2 -  - 3  73.8  3.8 85.0 88.3 55.4  Bilevel 24/20/0 O2: 4 42.5 17.5 25.0 0.0 58.8% - - - 1 2.4 1  2.4 3  9.6  19.8 85.0 88.0 21.6    Hypnograms                           Technologist Comments  Patients was here at sleep lab for OSA.  A Split Night Study was ordered.  Patient was fitted with a large Simplus FFM.  Patient met split night criteria per standing protocol.  CPAP pressure was started at 8 CMH2O and increased to CPAP pressure of 20 CMH2o with 4 LPM of oxygen. Then, patient was switch over to a Bilevel pressure of 22/18 CMH2O for increased pressure needs and for patient's comfort with increased pressures.  Bilevel pressure of 24/20 CMH2O with 4 LPM of oxygen, heated humidity and heated tubing.  Patient tolerated CPAP / Bilevel  pressure well. Snoring was eliminated.  Periodic Limb Movement was rare.  EKG showed NSR with occasional abnormal rhythm throughout the study (see copy).  No medication was taken.  Patient was place on 1 LPM of oxygen at 2:43 AM and was increased to 4 LPM of oxygen per lab protocol for SAO2 levels 88% and below. Patient had four restroom visits.                            [employer] Diplomate, Biomedical engineer of Sleep Medicine  ELECTRONICALLY SIGNED ON:  07/19/2023, 12:13 PM [facility] PH: [phone]   FX: [phone] ACCREDITED BY THE AMERICAN ACADEMY OF SLEEP MEDICINE

## 2023-07-21 ENCOUNTER — Telehealth: Payer: Self-pay | Admitting: *Deleted

## 2023-07-21 ENCOUNTER — Telehealth (INDEPENDENT_AMBULATORY_CARE_PROVIDER_SITE_OTHER): Payer: Self-pay | Admitting: Primary Care

## 2023-07-21 NOTE — Telephone Encounter (Signed)
 Copied from CRM [id number]. Topic: Clinical - Medical Advice >> Jul 21, 2023  3:06 PM Everette C wrote: Reason for CRM: Mercedes with [facility] has called to request possible orders for wound care for the patient's calve ulcers as well as to discuss tentative referrals for pain management and vascular surgery. Please contact  Mercedes when possible

## 2023-07-21 NOTE — Telephone Encounter (Signed)
  Copied from CRM [id number]. Topic: Appointments - Scheduling Inquiry for Clinic >> Jul 18, 2023  2:27 PM Shelby Dessert H wrote: Reason for CRM: Jared Mccoy is calling from [facility] and she is wanting to know if they should find the patient a pain management provider and a vascular provider or should the [facility] provider [facility] find him one? She can be called back at [phone] option [phone] ext [phone], if she doesn't answer its okay to leave a voicemail message.   She also wants the provider to send the orders for wound care.

## 2023-07-22 ENCOUNTER — Ambulatory Visit (HOSPITAL_COMMUNITY): Attending: Physician Assistant | Admitting: Physician Assistant

## 2023-07-22 ENCOUNTER — Encounter (HOSPITAL_COMMUNITY): Payer: Self-pay

## 2023-07-22 NOTE — Telephone Encounter (Signed)
 Will forward to provider

## 2023-07-25 ENCOUNTER — Telehealth: Payer: Self-pay | Admitting: Nurse Practitioner

## 2023-07-25 DIAGNOSIS — G4733 Obstructive sleep apnea (adult) (pediatric): Secondary | ICD-10-CM

## 2023-07-25 DIAGNOSIS — E662 Morbid (severe) obesity with alveolar hypoventilation: Secondary | ICD-10-CM

## 2023-07-25 DIAGNOSIS — G4734 Idiopathic sleep related nonobstructive alveolar hypoventilation: Secondary | ICD-10-CM

## 2023-07-28 ENCOUNTER — Telehealth (INDEPENDENT_AMBULATORY_CARE_PROVIDER_SITE_OTHER): Payer: Self-pay

## 2023-07-28 ENCOUNTER — Telehealth (INDEPENDENT_AMBULATORY_CARE_PROVIDER_SITE_OTHER): Payer: Self-pay | Admitting: Primary Care

## 2023-07-28 NOTE — Telephone Encounter (Signed)
 Note Copied from CRM [id number] [phone]. Topic: General - Other >> Jul 28, 2023  8:06 AM Emylou G wrote: Reason for CRM: Gwen w/ [facility] called.. wants to know if he has been compliant w/his followups.. She needs to schedule surgery        Very severe sleep apnea with significant oxygen desaturations and OHS. I sent urgent new start auto BIPAP IPAP max 24, EPAP min 16, PS 2 with. Supplemental O2 5L Please verify order and notify pt  Thanks!     I spoke with the pt and notified of results and recommendations. He is aware DME will contact him for set up and that order went through to [facility] 07/25/23.   Copy of this encounter faxed to Dr Sonnie Dusky along with last ov note.

## 2023-07-28 NOTE — Telephone Encounter (Signed)
 Copied from CRM [id number]. Topic: Clinical - Medical Advice >> Jul 21, 2023  3:06 PM Everette C wrote: Reason for CRM: Mercedes with [facility] has called to request possible orders for wound care for the patient's calve ulcers as well as to discuss tentative referrals for pain management and vascular surgery. Please contact  Mercedes when possible >> Jul 28, 2023 10:21 AM Crispin Dolphin wrote: Terrea Ferrier called back check on status of referrals. Not showing any update. Would like a call back. [phone]  option [phone] ext [phone] Thank You  >> Jul 25, 2023 11:44 AM Emylou G wrote: Terrea Ferrier w/[facility] called.. checking status >> Jul 23, 2023  2:02 PM Emylou G wrote: Terrea Ferrier w/[facility] called checking status of both her req.. 2 referrals and wound care?  Adv was forwarded .Aaron Aas Pls call her if not rcvd [phone] option [phone]

## 2023-07-28 NOTE — Telephone Encounter (Signed)
 Copied from CRM [phone]. Topic: General - Other >> Jul 28, 2023  8:06 AM Emylou G wrote: Reason for CRM: Gwen w/ [facility] called.. wants to know if he has been compliant w/his followups.. She needs to schedule surgery

## 2023-07-28 NOTE — Telephone Encounter (Signed)
 Will forward to provider  Sent provider same CRM message dated 07/21/23 on 07/22/23

## 2023-07-28 NOTE — Telephone Encounter (Signed)
 Called pt to confirm appt. Pt will be present.

## 2023-07-28 NOTE — Telephone Encounter (Signed)
 Pt has an appt on 07/29/23

## 2023-07-29 ENCOUNTER — Ambulatory Visit (INDEPENDENT_AMBULATORY_CARE_PROVIDER_SITE_OTHER): Admitting: Primary Care

## 2023-07-29 ENCOUNTER — Encounter (INDEPENDENT_AMBULATORY_CARE_PROVIDER_SITE_OTHER): Payer: Self-pay | Admitting: Primary Care

## 2023-07-29 ENCOUNTER — Other Ambulatory Visit: Payer: Self-pay

## 2023-07-29 ENCOUNTER — Telehealth: Payer: Self-pay

## 2023-07-29 VITALS — BP 140/78 | HR 103 | Resp 16 | Wt >= 6400 oz

## 2023-07-29 DIAGNOSIS — Z09 Encounter for follow-up examination after completed treatment for conditions other than malignant neoplasm: Secondary | ICD-10-CM

## 2023-07-29 DIAGNOSIS — I10 Essential (primary) hypertension: Secondary | ICD-10-CM

## 2023-07-29 DIAGNOSIS — R600 Localized edema: Secondary | ICD-10-CM

## 2023-07-29 DIAGNOSIS — E876 Hypokalemia: Secondary | ICD-10-CM | POA: Diagnosis not present

## 2023-07-29 DIAGNOSIS — T8130XD Disruption of wound, unspecified, subsequent encounter: Secondary | ICD-10-CM

## 2023-07-29 DIAGNOSIS — I890 Lymphedema, not elsewhere classified: Secondary | ICD-10-CM | POA: Diagnosis not present

## 2023-07-29 DIAGNOSIS — I4891 Unspecified atrial fibrillation: Secondary | ICD-10-CM

## 2023-07-29 MED ORDER — HYDRALAZINE HCL 50 MG PO TABS
50.0000 mg | ORAL_TABLET | Freq: Three times a day (TID) | ORAL | 1 refills | Status: DC
  Filled 2023-07-29: qty 90, 30d supply, fill #0

## 2023-07-29 MED ORDER — AMLODIPINE BESYLATE 10 MG PO TABS
10.0000 mg | ORAL_TABLET | Freq: Every day | ORAL | 0 refills | Status: DC
  Filled 2023-07-29: qty 90, 90d supply, fill #0

## 2023-07-29 MED ORDER — SPIRONOLACTONE 25 MG PO TABS: 25.0000 mg | ORAL_TABLET | Freq: Two times a day (BID) | ORAL | 0 refills | Status: DC

## 2023-07-29 MED ORDER — CARVEDILOL 25 MG PO TABS
25.0000 mg | ORAL_TABLET | Freq: Two times a day (BID) | ORAL | 0 refills | Status: DC
  Filled 2023-07-29: qty 60, 30d supply, fill #0

## 2023-07-29 NOTE — Progress Notes (Unsigned)
 Subjective:  [name] is a 33 y.o. severe morbid obese male adult who presents for hospital follow up.  Patient presented to the emergency room on 07/12/23, with labor breathing that became worse, chest tightness and heart fluttering.  He also endorsed that he has been experiencing episodes of fast heartbeats intermittently.  Patient was discharged from the hospital on 07/13/23, instructed to follow-up with PCP that he is doing now.  Cardiology referral sent.  BiPAP was just approved on Friday.  Also recommended weight loss with GLP 1.  Other concerning issues patient expressed was wound care , vascular and pain management.  Actually patient's main concern is having his teeth pulled explained to patient unable to sign medical clearance until seen by cardiology and blood pressure was 130/80 or very close to it.  Patient is not happy with this response but does understand.  Past Medical History:  Diagnosis Date   Hypertension    not on medication   Obese    Peripheral vascular disease (HCC)    edema in legs     Allergies  Allergen Reactions   Crab (Diagnostic) Diarrhea and Nausea And Vomiting    Reaction only to crab legs    Current Outpatient Medications on File Prior to Visit  Medication Sig Dispense Refill   amLODipine  (NORVASC ) 10 MG tablet Take 1 tablet (10 mg total) by mouth daily. 90 tablet 0   aspirin  EC 81 MG tablet Take 1 tablet (81 mg total) by mouth daily. Swallow whole.     carvedilol  (COREG ) 25 MG tablet Take 1 tablet (25 mg total) by mouth 2 (two) times daily with a meal. 180 tablet 0   hydrALAZINE  (APRESOLINE ) 50 MG tablet Take 1 tablet (50 mg total) by mouth 3 (three) times daily. 90 tablet 1   No current facility-administered medications on file prior to visit.    Review of System: ROS Comprehensive ROS Pertinent positive and negative noted in HPI   Objective:  BP (!) 142/81   Pulse (!) 103   Resp 16   Wt (!) 536 lb 3.2 oz (243.2 kg)   SpO2 98%   BMI  72.72 kg/m   Filed Weights   07/29/23 1427  Weight: (!) 536 lb 3.2 oz (243.2 kg)    Physical Exam Vitals reviewed.  Constitutional:      Appearance: Normal appearance. He is obese.     Comments: Severe obesity   HENT:     Head: Normocephalic.     Right Ear: Tympanic membrane, ear canal and external ear normal.     Left Ear: Tympanic membrane, ear canal and external ear normal.     Nose: Nose normal.     Mouth/Throat:     Mouth: Mucous membranes are moist.   Eyes:     Extraocular Movements: Extraocular movements intact.     Pupils: Pupils are equal, round, and reactive to light.    Cardiovascular:     Rate and Rhythm: Normal rate.  Pulmonary:     Effort: Pulmonary effort is normal.     Breath sounds: Normal breath sounds.  Abdominal:     General: Bowel sounds are normal. There is distension.     Palpations: Abdomen is soft.   Musculoskeletal:        General: Normal range of motion.     Cervical back: Normal range of motion.   Skin:    General: Skin is warm and dry.   Neurological:     Mental  Status: He is alert and oriented to person, place, and time.   Psychiatric:        Mood and Affect: Mood normal.        Behavior: Behavior normal.        Thought Content: Thought content normal.      Assessment:  Jared Mccoy was seen today for hospitalization follow-up and medical clearance.  Diagnoses and all orders for this visit:  New onset atrial fibrillation [facility]) -     Ambulatory referral to Cardiology  Lymphedema -     Ambulatory Referral for Peripheral Vascular Consult  Essential hypertension  Bilateral edema of lower extremity -     spironolactone (ALDACTONE) 25 MG tablet; Take 1 tablet (25 mg total) by mouth 2 (two) times daily. -     Ambulatory Referral for Peripheral Vascular Consult  Wound disruption, subsequent encounter -     Consult to wound, ostomy, continence -     Ambulatory referral to Pain Clinic  Hypokalemia spironolactone (ALDACTONE) 25 MG  tablet    This note has been created with Education officer, environmental. Any transcriptional errors are unintentional.   No follow-ups on file.  Marius Siemens, NP 07/29/2023, 2:42 PM

## 2023-07-29 NOTE — Telephone Encounter (Signed)
 Called pt and made an appt for Friday at 10:45

## 2023-07-29 NOTE — Telephone Encounter (Signed)
 Contact pt tabout pumps per Dr. Julio Ohm

## 2023-07-29 NOTE — Telephone Encounter (Signed)
 Terrea Ferrier w/ [facility] calling to check on status of referral requests for patient.Terrea Ferrier states it is for 3 referrals, one for Wound care, pain management and vascular surgery.

## 2023-07-30 ENCOUNTER — Telehealth (INDEPENDENT_AMBULATORY_CARE_PROVIDER_SITE_OTHER): Payer: Self-pay | Admitting: Primary Care

## 2023-07-30 NOTE — Telephone Encounter (Signed)
 Copied from CRM [id number]. Topic: General - Other >> Jul 30, 2023  1:22 PM Sophia H wrote: Reason for CRM: Jaunita Messier - [facility] .  3051 form sent over a few weeks back, checking if has been received.  Sent on 06/10/2023 and scanned into patients chart. Jaunita Messier is requesting the provider fill it out for home care services. Please advise.    # [phone] [facility], can speak with Jaunita Messier or anyone else in office can assist.

## 2023-07-31 ENCOUNTER — Other Ambulatory Visit: Payer: Self-pay

## 2023-08-01 ENCOUNTER — Ambulatory Visit: Admitting: Family

## 2023-08-01 NOTE — Telephone Encounter (Signed)
 Copied from CRM [id number]. Topic: General - Other >> Jul 30, 2023 10:16 AM Sanjuana Crutch wrote: Reason for CRM: Terrea Ferrier w/ [facility] called.. wants to know if approval has been done or any updates on patient says she's been calling since 9th waiting on approval for patient Call back number [phone] option [phone] extension [phone] .

## 2023-08-01 NOTE — Telephone Encounter (Signed)
 Dentist office is closed on Fridays  Will reach out to them on Monday  Pt had a hospital f/u on 07/29/23 and per provider in regards to the medical clearance pt will need to follow up with Cardiology and referral has been placed

## 2023-08-01 NOTE — Telephone Encounter (Signed)
 Would you be able to follow up on this please if possible. I know pcs form was completed and faxed

## 2023-08-01 NOTE — Telephone Encounter (Signed)
 Number that is listed in last crm dated 07/30/23 the number is not correct Found the correct number and reached out to [facility] and lvm

## 2023-08-03 IMAGING — DX DG CHEST 1V PORT
2 series · 2 of 2 positions shown · non-contrast
Comparison: Portable chest 08/12/2019 and earlier.

CLINICAL DATA: 30-year-old male with shortness of breath and lower
extremity swelling.

EXAM:
PORTABLE CHEST 1 VIEW

[chest ap (1 of 2)]
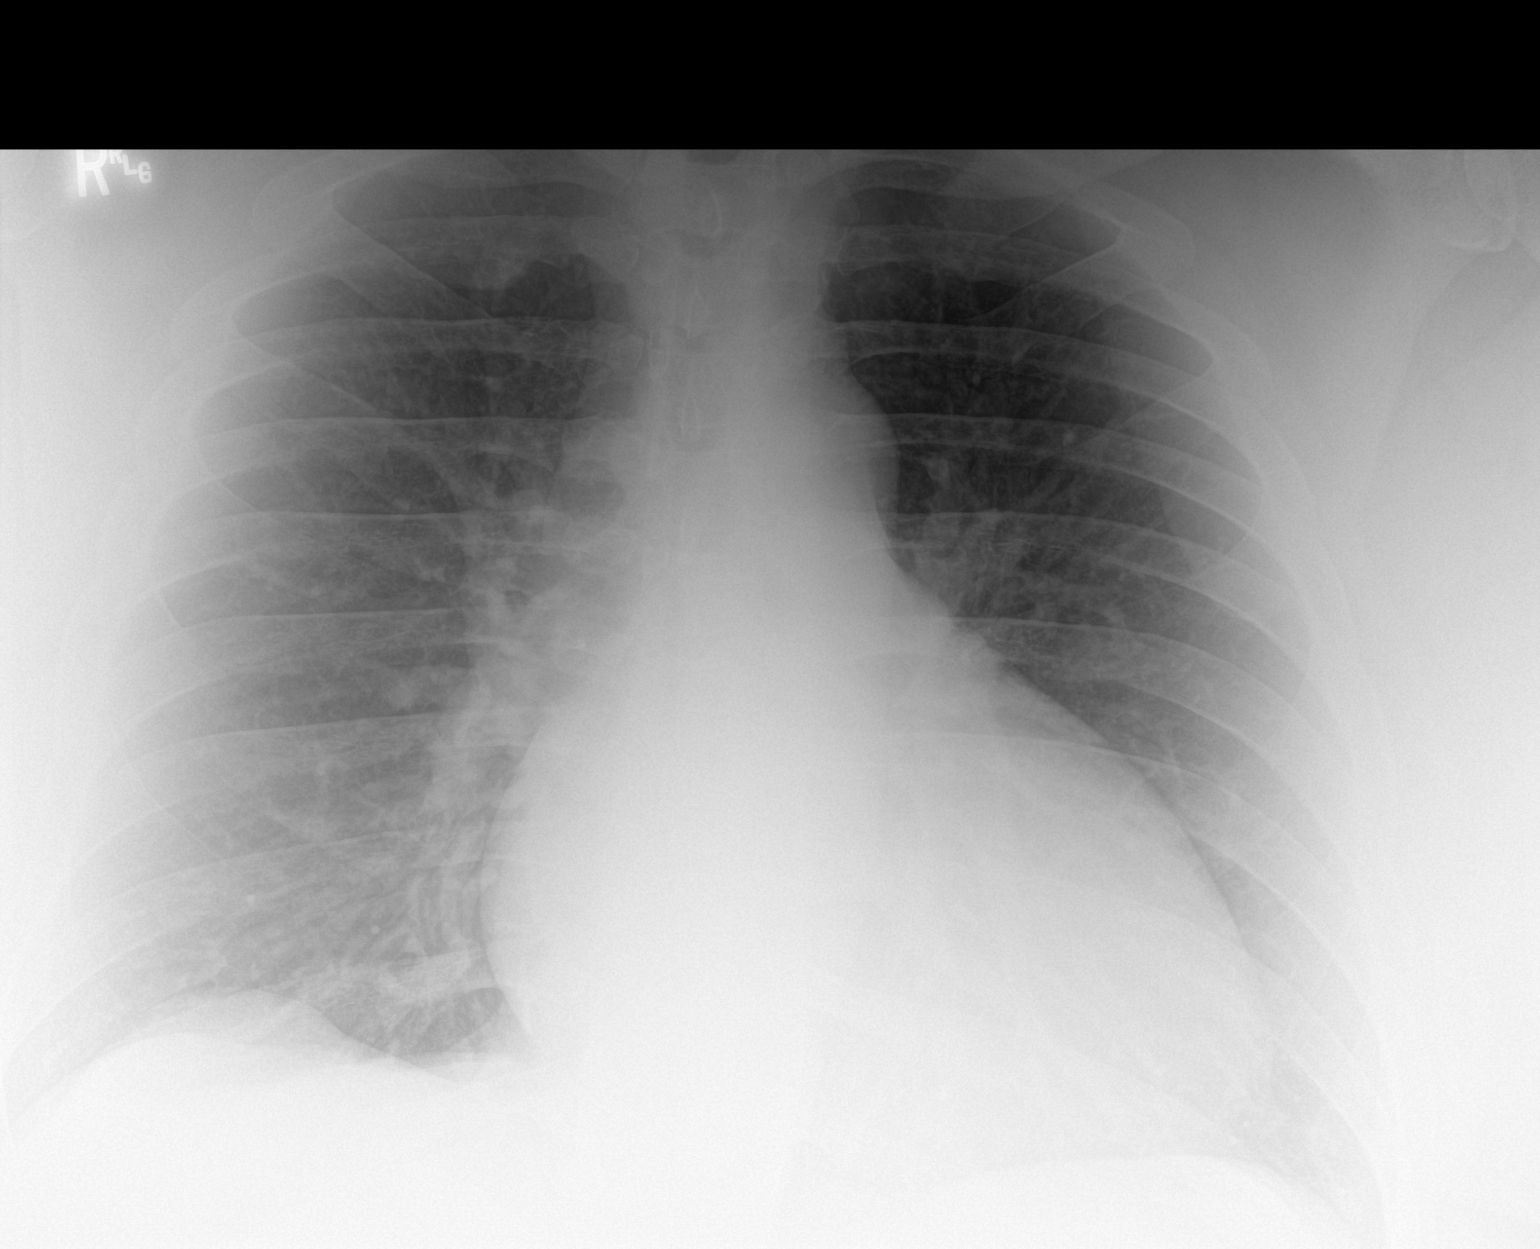

[chest ap (2 of 2)]
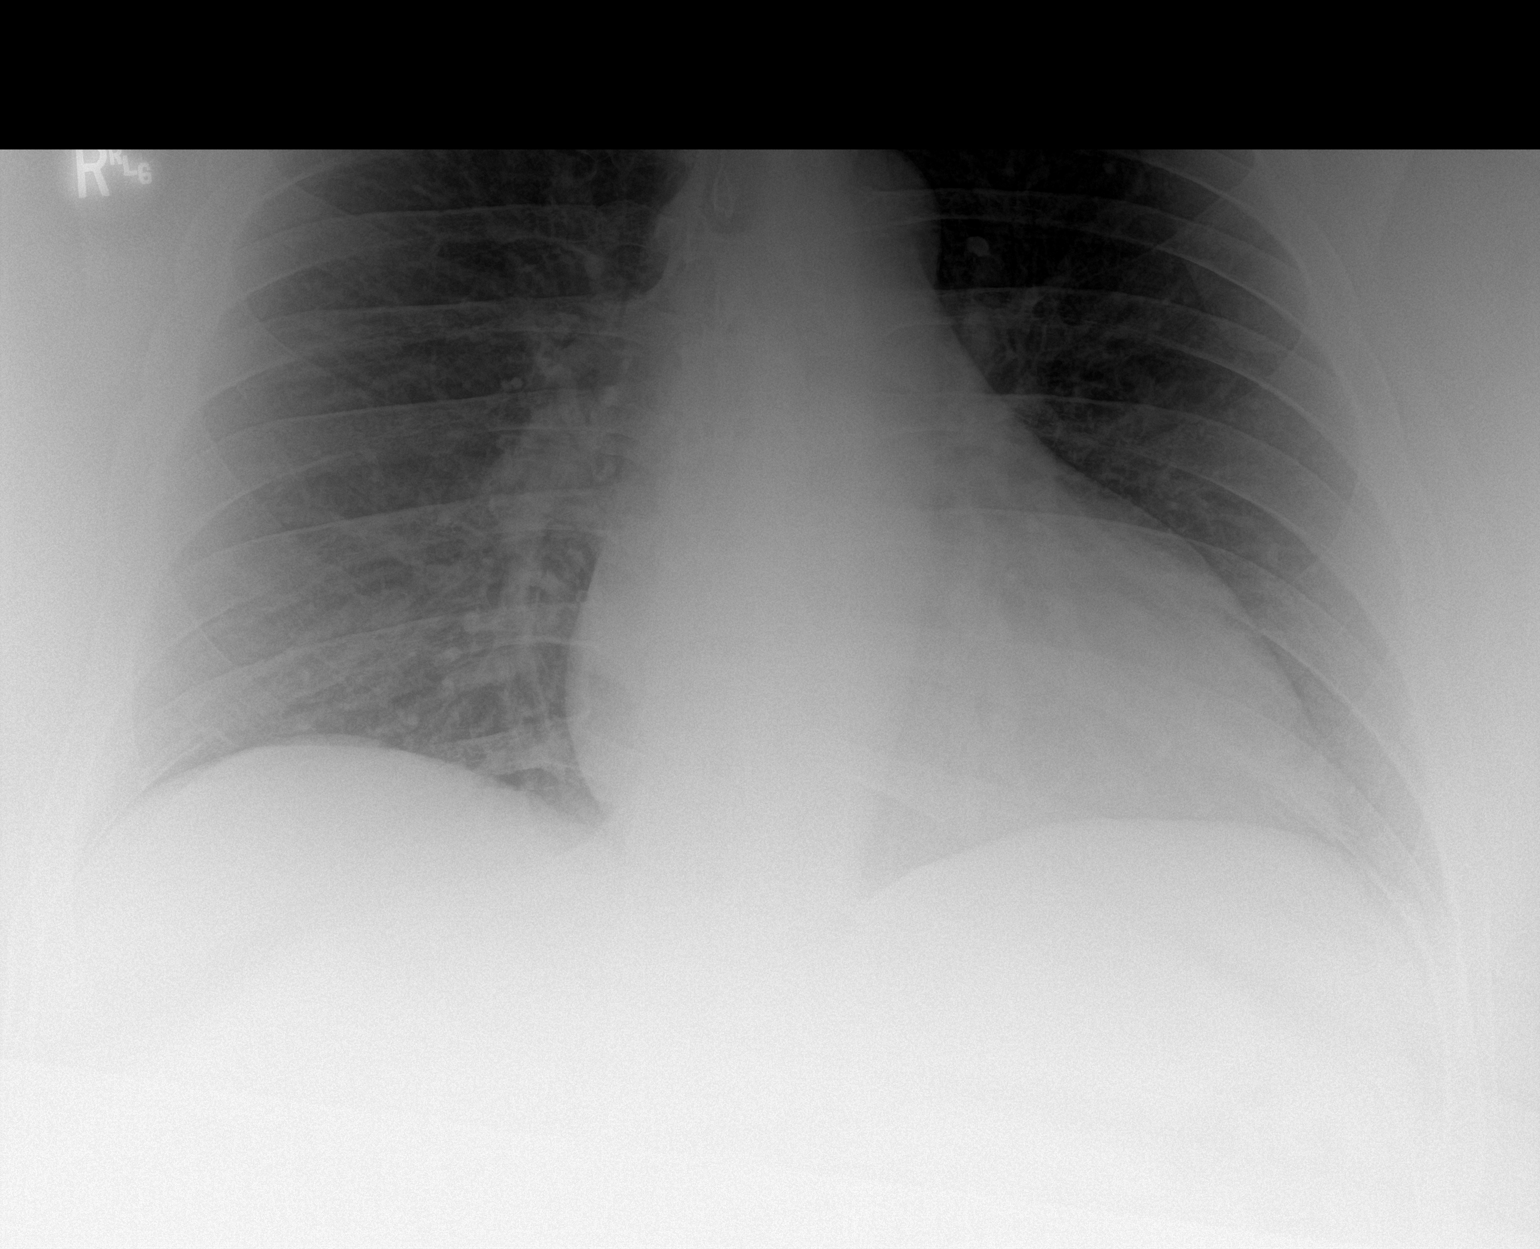

[2 of 2 positions shown; findings below may reference images not displayed]

FINDINGS: Portable AP semi upright views at 5139 hours. Stable cardiomegaly,
probably mild allowing for portable technique. Other mediastinal
contours are within normal limits. Visualized tracheal air column is
within normal limits. Allowing for portable technique the lungs are
clear, with decreased appearance of pulmonary vascularity compared
to last year. No pulmonary edema, pneumothorax or pleural effusion.
No osseous abnormality identified. Paucity of bowel gas in the upper
abdomen.
IMPRESSION: Chronic mild cardiomegaly but no acute cardiopulmonary abnormality.

## 2023-08-05 ENCOUNTER — Telehealth (INDEPENDENT_AMBULATORY_CARE_PROVIDER_SITE_OTHER): Payer: Self-pay | Admitting: Primary Care

## 2023-08-05 NOTE — Telephone Encounter (Signed)
 Copied from CRM [id number]. Topic: General - Other >> Aug 05, 2023  2:54 PM Larissa S wrote: Reason for CRM: Ellaree with [facility], states he faxed a 3051 form on 07/30/23. Requesting to have form completed and returned.

## 2023-08-05 NOTE — Telephone Encounter (Signed)
 Will forward to [place]

## 2023-08-05 NOTE — Telephone Encounter (Signed)
 Home health form that was faxed previously

## 2023-08-06 NOTE — Telephone Encounter (Signed)
 Spoke to [employer] and she made me aware that form was sent to [place] [facility]  Reached out to [facility] and spoke to [place] and made aware that from was faxed. Per Ellaree he has been reaching out to [place] [facility] and they haven't received anything. Per Cassandra it was faxed and she has a confirmation. What she will do is fax it again and cc me on email just in case.

## 2023-08-06 NOTE — Telephone Encounter (Signed)
 Returned call to Dentist office and made them aware that medical clearance form will not be signed at this moment due to pt being referred to cardio.  Gwen is out this week but lvm

## 2023-08-06 NOTE — Telephone Encounter (Signed)
 PCS request re faxed confirmation received. Jay[place]a included in fax.

## 2023-08-11 ENCOUNTER — Ambulatory Visit (HOSPITAL_COMMUNITY)
Admission: RE | Admit: 2023-08-11 | Discharge: 2023-08-11 | Disposition: A | Discharge status: Home / Self Care | Source: Ambulatory Visit | Attending: Physician Assistant | Admitting: Physician Assistant

## 2023-08-11 VITALS — BP 162/100 | HR 89 | Ht 72.0 in | Wt >= 6400 oz

## 2023-08-11 DIAGNOSIS — I480 Paroxysmal atrial fibrillation: Secondary | ICD-10-CM

## 2023-08-11 DIAGNOSIS — I4891 Unspecified atrial fibrillation: Secondary | ICD-10-CM

## 2023-08-11 DIAGNOSIS — I10 Essential (primary) hypertension: Secondary | ICD-10-CM | POA: Diagnosis not present

## 2023-08-11 NOTE — Progress Notes (Addendum)
 Primary Care Physician: Celestia Rosaline SQUIBB, NP Primary Cardiologist: None Electrophysiologist: None  Referring Physician: ED   Jared Mccoy is a 33 y.o. adult with a history of HTN, OSA, atrial fibrillation who presents for consultation in the [facility].  The patient was initially diagnosed with atrial fibrillation 07/12/23 after presenting to the ED with symptoms of chest tightness and palpitations. ECG showed atrial fibrillation. He was started on a diltiazem  drip and his home carvedilol  was resumed. He spontaneously converted to SR. Urine drug screen was positive for cocaine. Patient was not started on anticoagulation with a low CV scores.   Today, patient remains in SR and feels well. He has not had any further palpitations. He is currently waiting for his BiPap machine to arrive. He denies alcohol use.   Today, he denies symptoms of palpitations, chest pain, shortness of breath, orthopnea, PND, lower extremity edema, dizziness, presyncope, syncope, bleeding, or neurologic sequela. The patient is tolerating medications without difficulties and is otherwise without complaint today.    Atrial Fibrillation Risk Factors:  he does have symptoms or diagnosis of sleep apnea. he does not have a history of rheumatic fever. he does not have a history of alcohol use. The patient does not have a history of early familial atrial fibrillation or other arrhythmias.  Atrial Fibrillation Management history:  Previous antiarrhythmic drugs: none Previous cardioversions: none Previous ablations: none Anticoagulation history: none  ROS- All systems are reviewed and negative except as per the HPI above.  Past Medical History:  Diagnosis Date   Hypertension    not on medication   Obese    Peripheral vascular disease (HCC)    edema in legs     Current Outpatient Medications  Medication Sig Dispense Refill   amLODipine  (NORVASC ) 10 MG tablet Take 1 tablet (10  mg total) by mouth daily. 90 tablet 0   aspirin  EC 81 MG tablet Take 1 tablet (81 mg total) by mouth daily. Swallow whole.     carvedilol  (COREG ) 25 MG tablet Take 1 tablet (25 mg total) by mouth 2 (two) times daily with a meal. 60 tablet 0   hydrALAZINE  (APRESOLINE ) 50 MG tablet Take 1 tablet (50 mg total) by mouth 3 (three) times daily. 90 tablet 1   spironolactone  (ALDACTONE ) 25 MG tablet Take 1 tablet (25 mg total) by mouth 2 (two) times daily. 60 tablet 0   No current facility-administered medications for this encounter.    Physical Exam: BP (!) 162/100   Pulse 89   Ht 6' (1.829 m)   Wt (!) 249.6 kg   BMI 74.62 kg/m   GEN: Well nourished, well developed in no acute distress NECK: No JVD CARDIAC: Regular rate and rhythm, no murmurs, rubs, gallops RESPIRATORY:  Clear to auscultation without rales, wheezing or rhonchi  ABDOMEN: Soft, non-tender, non-distended EXTREMITIES:  edema difficult to assess due to body habitus, chronic venous stasis  Wt Readings from Last 3 Encounters:  08/11/23 (!) 249.6 kg  07/29/23 (!) 243.2 kg  07/12/23 (!) 252.9 kg     EKG today demonstrates  SR, NST Vent. rate 89 BPM PR interval 144 ms QRS duration 92 ms QT/QTcB 380/462 ms   Echo 07/13/23 demonstrated   1. Left ventricular ejection fraction, by estimation, is 65 to 70%. The  left ventricle has normal function. Left ventricular endocardial border  not optimally defined to evaluate regional wall motion. There is severe  left ventricular hypertrophy. Left ventricular diastolic parameters were  normal.   2. RV not well visualized, grossly appears normal in size and function. .  Right ventricular systolic function was not well visualized. The right  ventricular size is not well visualized. Tricuspid regurgitation signal is  inadequate for assessing PA pressure.   3. The mitral valve was not well visualized. No evidence of mitral valve  regurgitation. No evidence of mitral stenosis.   4. The  aortic valve was not well visualized. Aortic valve regurgitation  is not visualized. No aortic stenosis is present.   5. Aortic dilatation noted. There is mild dilatation of the ascending  aorta, measuring 39 mm.   6. The inferior vena cava is dilated in size with >50% respiratory  variability, suggesting right atrial pressure of 8 mmHg.    CHA2DS2-VASc Score = 1  The patient's score is based upon: CHF History: 0 HTN History: 1 Diabetes History: 0 Stroke History: 0 Vascular Disease History: 0 Age Score: 0 Gender Score: 0       ASSESSMENT AND PLAN: Paroxysmal Atrial Fibrillation (ICD10:  I48.0) The patient's CHA2DS2-VASc score is 1, indicating a 0.6% annual risk of stroke.   Patient remains in SR. Afib likely secondary to obesity, OSA, and polysubstance use.  Recommend aggressive lifestyle modification. He is not a candidate for ablation.  Continue carvedilol  25 mg BID Anticoagulation not indicated at this time with low CV score.  Patient OK from afib standpoint to proceed with dental work.   Obesity Body mass index is 74.62 kg/m.  Encouraged lifestyle modification  OSA/OHS Patient waiting to receive BiPAP The importance of adequate treatment of sleep apnea was discussed today in order to improve our ability to maintain sinus rhythm long term.  HTN Elevated today, will refer to HTN clinic.    Will refer patient to establish care with a primary cardiologist.        Daril Kicks [facility] [address] [place], [place] [address] [phone]

## 2023-08-11 NOTE — Patient Instructions (Signed)
 Stop aspirin.

## 2023-08-13 ENCOUNTER — Ambulatory Visit (INDEPENDENT_AMBULATORY_CARE_PROVIDER_SITE_OTHER): Admitting: Primary Care

## 2023-08-13 ENCOUNTER — Telehealth: Payer: Self-pay

## 2023-08-13 NOTE — Telephone Encounter (Signed)
 CMN signed and fax was successful.

## 2023-08-13 NOTE — Telephone Encounter (Signed)
 Received CMN, awaiting provider's signature.

## 2023-08-19 ENCOUNTER — Ambulatory Visit (INDEPENDENT_AMBULATORY_CARE_PROVIDER_SITE_OTHER): Admitting: Primary Care

## 2023-08-21 ENCOUNTER — Telehealth (INDEPENDENT_AMBULATORY_CARE_PROVIDER_SITE_OTHER): Payer: Self-pay | Admitting: Primary Care

## 2023-08-21 ENCOUNTER — Institutional Professional Consult (permissible substitution) (HOSPITAL_BASED_OUTPATIENT_CLINIC_OR_DEPARTMENT_OTHER): Admitting: Family

## 2023-08-21 NOTE — Telephone Encounter (Signed)
 Per pt representative from ([facility]) came in with paperwork in hand stating that they had a hard time faxing over paperwork for pt. Rep said that he intended to give this to the CMA to be filled out and processed. Please advise.     Any questions please contact them at [phone]  Email- [email or web address]

## 2023-08-26 DIAGNOSIS — G4733 Obstructive sleep apnea (adult) (pediatric): Secondary | ICD-10-CM | POA: Diagnosis not present

## 2023-08-27 ENCOUNTER — Encounter (HOSPITAL_BASED_OUTPATIENT_CLINIC_OR_DEPARTMENT_OTHER): Payer: Self-pay

## 2023-09-01 NOTE — Telephone Encounter (Signed)
 You are welcome

## 2023-09-01 NOTE — Telephone Encounter (Signed)
 Thank you :)

## 2023-09-01 NOTE — Telephone Encounter (Signed)
 PCS Form refaxed to [place] [facility]

## 2023-09-02 ENCOUNTER — Encounter (HOSPITAL_COMMUNITY): Payer: Self-pay

## 2023-09-02 ENCOUNTER — Observation Stay (HOSPITAL_COMMUNITY)
Admission: EM | Admit: 2023-09-02 | Discharge: 2023-09-04 | Disposition: A | Discharge status: Home / Self Care | Attending: Internal Medicine | Admitting: Internal Medicine

## 2023-09-02 ENCOUNTER — Other Ambulatory Visit: Payer: Self-pay

## 2023-09-02 ENCOUNTER — Ambulatory Visit: Payer: Self-pay | Admitting: *Deleted

## 2023-09-02 DIAGNOSIS — I480 Paroxysmal atrial fibrillation: Secondary | ICD-10-CM | POA: Diagnosis not present

## 2023-09-02 DIAGNOSIS — I1A0 Resistant hypertension: Secondary | ICD-10-CM | POA: Insufficient documentation

## 2023-09-02 DIAGNOSIS — M79669 Pain in unspecified lower leg: Secondary | ICD-10-CM | POA: Insufficient documentation

## 2023-09-02 DIAGNOSIS — F17210 Nicotine dependence, cigarettes, uncomplicated: Secondary | ICD-10-CM | POA: Diagnosis not present

## 2023-09-02 DIAGNOSIS — E8770 Fluid overload, unspecified: Secondary | ICD-10-CM | POA: Diagnosis not present

## 2023-09-02 DIAGNOSIS — D530 Protein deficiency anemia: Secondary | ICD-10-CM | POA: Insufficient documentation

## 2023-09-02 DIAGNOSIS — Z79899 Other long term (current) drug therapy: Secondary | ICD-10-CM | POA: Diagnosis not present

## 2023-09-02 DIAGNOSIS — R2243 Localized swelling, mass and lump, lower limb, bilateral: Secondary | ICD-10-CM | POA: Diagnosis present

## 2023-09-02 DIAGNOSIS — Z91148 Patient's other noncompliance with medication regimen for other reason: Secondary | ICD-10-CM | POA: Insufficient documentation

## 2023-09-02 DIAGNOSIS — R600 Localized edema: Secondary | ICD-10-CM | POA: Diagnosis not present

## 2023-09-02 DIAGNOSIS — I890 Lymphedema, not elsewhere classified: Secondary | ICD-10-CM | POA: Diagnosis present

## 2023-09-02 DIAGNOSIS — E876 Hypokalemia: Secondary | ICD-10-CM | POA: Diagnosis not present

## 2023-09-02 DIAGNOSIS — I10 Essential (primary) hypertension: Secondary | ICD-10-CM | POA: Diagnosis not present

## 2023-09-02 DIAGNOSIS — R770 Abnormality of albumin: Secondary | ICD-10-CM | POA: Diagnosis not present

## 2023-09-02 DIAGNOSIS — F1410 Cocaine abuse, uncomplicated: Secondary | ICD-10-CM | POA: Diagnosis not present

## 2023-09-02 DIAGNOSIS — E66813 Obesity, class 3: Secondary | ICD-10-CM | POA: Diagnosis not present

## 2023-09-02 DIAGNOSIS — Z8679 Personal history of other diseases of the circulatory system: Secondary | ICD-10-CM | POA: Diagnosis not present

## 2023-09-02 DIAGNOSIS — Z6841 Body Mass Index (BMI) 40.0 and over, adult: Secondary | ICD-10-CM | POA: Insufficient documentation

## 2023-09-02 DIAGNOSIS — E8779 Other fluid overload: Secondary | ICD-10-CM | POA: Insufficient documentation

## 2023-09-02 LAB — CBC WITH DIFFERENTIAL/PLATELET
Abs Immature Granulocytes: 0.03 K/uL (ref 0.00–0.07)
Basophils Absolute: 0 K/uL (ref 0.0–0.1)
Basophils Relative: 0 %
Eosinophils Absolute: 0.3 K/uL (ref 0.0–0.5)
Eosinophils Relative: 4 %
HCT: 40.5 % (ref 39.0–52.0)
Hemoglobin: 11.9 g/dL — ABNORMAL LOW (ref 13.0–17.0)
Immature Granulocytes: 0 %
Lymphocytes Relative: 26 %
Lymphs Abs: 2 K/uL (ref 0.7–4.0)
MCH: 23.4 pg — ABNORMAL LOW (ref 26.0–34.0)
MCHC: 29.4 g/dL — ABNORMAL LOW (ref 30.0–36.0)
MCV: 79.7 fL — ABNORMAL LOW (ref 80.0–100.0)
Monocytes Absolute: 0.7 K/uL (ref 0.1–1.0)
Monocytes Relative: 9 %
Neutro Abs: 4.6 K/uL (ref 1.7–7.7)
Neutrophils Relative %: 61 %
Platelets: 270 K/uL (ref 150–400)
RBC: 5.08 MIL/uL (ref 4.22–5.81)
RDW: 18.8 % — ABNORMAL HIGH (ref 11.5–15.5)
WBC: 7.7 K/uL (ref 4.0–10.5)
nRBC: 0 % (ref 0.0–0.2)

## 2023-09-02 LAB — COMPREHENSIVE METABOLIC PANEL WITH GFR
ALT: 17 U/L (ref 0–44)
AST: 17 U/L (ref 15–41)
Albumin: 3.4 g/dL — ABNORMAL LOW (ref 3.5–5.0)
Alkaline Phosphatase: 77 U/L (ref 38–126)
Anion gap: 9 (ref 5–15)
BUN: 10 mg/dL (ref 6–20)
CO2: 31 mmol/L (ref 22–32)
Calcium: 8.7 mg/dL — ABNORMAL LOW (ref 8.9–10.3)
Chloride: 101 mmol/L (ref 98–111)
Creatinine, Ser: 1.4 mg/dL — ABNORMAL HIGH (ref 0.61–1.24)
GFR, Estimated: >60 mL/min (ref >60)
Glucose, Bld: 99 mg/dL (ref 70–99)
Potassium: 2.7 mmol/L — CL (ref 3.5–5.1)
Sodium: 141 mmol/L (ref 135–145)
Total Bilirubin: 0.4 mg/dL (ref 0.0–1.2)
Total Protein: 7.8 g/dL (ref 6.5–8.1)

## 2023-09-02 NOTE — Telephone Encounter (Signed)
 Reason for Disposition  Patient sounds very sick or weak to the triager  Answer Assessment - Initial Assessment Questions 1. ONSET: When did the swelling start? (e.g., minutes, hours, days)     I have edema really bad.  I have a hole in the back of my right leg there is a bunch of fluid pouring out of the hole.   It started this morning.   It's a cloudy fluid.    2. LOCATION: What part of the leg is swollen?  Are both legs swollen or just one leg?     Swollen above the knees on both legs. Right is worse.   3. SEVERITY: How bad is the swelling? (e.g., localized; mild, moderate, severe)     It's really bad   I have edema real bad. 4. REDNESS: Is there redness or signs of infection?    Sister got on line and said he is hot and sweaty and clammy.  He is not really talking.   He is in a lot of pain.    5. PAIN: Is the swelling painful to touch? If Yes, ask: How painful is it?   (Scale 1-10; mild, moderate or severe)     He is having a lot of pain.   6. FEVER: Do you have a fever? If Yes, ask: What is it, how was it measured, and when did it start?      No sure. 7. CAUSE: What do you think is causing the leg swelling?     Edema really bad above the knees on both legs.  8. MEDICAL HISTORY: Do you have a history of blood clots (e.g., DVT), cancer, heart failure, kidney disease, or liver failure?     Not asked   At this point after sister got on line and said he was hot and sweaty and clammy feeling and not talking very much I instructed her to call 911 which she was agreeable to doing.   9. RECURRENT SYMPTOM: Have you had leg swelling before? If Yes, ask: When was the last time? What happened that time?     Not asked 10. OTHER SYMPTOMS: Do you have any other symptoms? (e.g., chest pain, difficulty breathing)       Hot and sweaty and clammy.   Not talking very much.   The fluid is pouring out from the hole in the back of his right leg. 11. PREGNANCY: Is there any chance  you are pregnant? When was your last menstrual period?       N/A  Protocols used: Leg Swelling and Edema-A-AH FYI Only or Action Required?: FYI only for provider.  Patient was last seen in primary care on 07/29/2023 by Celestia Rosaline SQUIBB, NP.  Called Nurse Triage reporting Leg Swelling.Both legs are swollen up above the knees with really bad edema and fluid pouring out of a hole in the back of his right leg.  He is hot and sweaty and clammy feeling and not talking very much.  Symptoms began this morning with the fluid pouring out of the hole in right leg.  Interventions attempted: Nothing.  Symptoms are: rapidly worsening.  Triage Disposition: Call EMS 911 Now  Patient/caregiver understands and will follow disposition?: Yes

## 2023-09-02 NOTE — ED Notes (Addendum)
 Patients name moved to lobby patient remains in triage 2

## 2023-09-02 NOTE — ED Triage Notes (Signed)
 Pt states he has edema on his leg that has started weeping. Pt says it started yesterday.   Pt state he has a headache and endorses of taking BP medication.   Pt denies weakness, blurred vision, and cp at this time.

## 2023-09-02 NOTE — Telephone Encounter (Signed)
 Noted. Agree with disposition. Routing to PCP office as fyi.

## 2023-09-02 NOTE — ED Notes (Signed)
 Patient name was called x5 with no response. Taking OTF

## 2023-09-02 NOTE — Telephone Encounter (Signed)
Pt currently in the ED.

## 2023-09-02 NOTE — ED Triage Notes (Signed)
 Pt reports bilateral leg swelling and pain x 2 days.

## 2023-09-02 NOTE — ED Notes (Signed)
 Patient is in triage 2

## 2023-09-02 NOTE — ED Notes (Signed)
 Pt legs started weeping. RN applied pad and gauze bilaterally.   Pt has family at bedside. AOx4

## 2023-09-03 ENCOUNTER — Inpatient Hospital Stay (HOSPITAL_COMMUNITY)

## 2023-09-03 ENCOUNTER — Emergency Department (HOSPITAL_COMMUNITY)

## 2023-09-03 DIAGNOSIS — I890 Lymphedema, not elsewhere classified: Secondary | ICD-10-CM | POA: Diagnosis present

## 2023-09-03 DIAGNOSIS — I1A0 Resistant hypertension: Secondary | ICD-10-CM | POA: Diagnosis not present

## 2023-09-03 DIAGNOSIS — R600 Localized edema: Secondary | ICD-10-CM | POA: Diagnosis not present

## 2023-09-03 DIAGNOSIS — R0602 Shortness of breath: Secondary | ICD-10-CM | POA: Diagnosis not present

## 2023-09-03 DIAGNOSIS — I509 Heart failure, unspecified: Secondary | ICD-10-CM | POA: Diagnosis not present

## 2023-09-03 DIAGNOSIS — R2243 Localized swelling, mass and lump, lower limb, bilateral: Secondary | ICD-10-CM | POA: Diagnosis not present

## 2023-09-03 DIAGNOSIS — M7989 Other specified soft tissue disorders: Secondary | ICD-10-CM | POA: Diagnosis not present

## 2023-09-03 DIAGNOSIS — I517 Cardiomegaly: Secondary | ICD-10-CM | POA: Diagnosis not present

## 2023-09-03 LAB — RAPID URINE DRUG SCREEN, HOSP PERFORMED
Amphetamines: NOT DETECTED
Barbiturates: NOT DETECTED
Benzodiazepines: NOT DETECTED
Cocaine: POSITIVE — AB
Opiates: NOT DETECTED
Tetrahydrocannabinol: NOT DETECTED

## 2023-09-03 LAB — COMPREHENSIVE METABOLIC PANEL WITH GFR
ALT: 15 U/L (ref 0–44)
AST: 14 U/L — ABNORMAL LOW (ref 15–41)
Albumin: 3 g/dL — ABNORMAL LOW (ref 3.5–5.0)
Alkaline Phosphatase: 67 U/L (ref 38–126)
Anion gap: 9 (ref 5–15)
BUN: 10 mg/dL (ref 6–20)
CO2: 32 mmol/L (ref 22–32)
Calcium: 8.3 mg/dL — ABNORMAL LOW (ref 8.9–10.3)
Chloride: 100 mmol/L (ref 98–111)
Creatinine, Ser: 1.3 mg/dL — ABNORMAL HIGH (ref 0.61–1.24)
GFR, Estimated: >60 mL/min (ref >60)
Glucose, Bld: 110 mg/dL — ABNORMAL HIGH (ref 70–99)
Potassium: 3 mmol/L — ABNORMAL LOW (ref 3.5–5.1)
Sodium: 141 mmol/L (ref 135–145)
Total Bilirubin: 0.4 mg/dL (ref 0.0–1.2)
Total Protein: 6.7 g/dL (ref 6.5–8.1)

## 2023-09-03 LAB — CBC
HCT: 38.3 % — ABNORMAL LOW (ref 39.0–52.0)
Hemoglobin: 11.3 g/dL — ABNORMAL LOW (ref 13.0–17.0)
MCH: 23.4 pg — ABNORMAL LOW (ref 26.0–34.0)
MCHC: 29.5 g/dL — ABNORMAL LOW (ref 30.0–36.0)
MCV: 79.5 fL — ABNORMAL LOW (ref 80.0–100.0)
Platelets: 257 K/uL (ref 150–400)
RBC: 4.82 MIL/uL (ref 4.22–5.81)
RDW: 18.7 % — ABNORMAL HIGH (ref 11.5–15.5)
WBC: 7.3 K/uL (ref 4.0–10.5)
nRBC: 0 % (ref 0.0–0.2)

## 2023-09-03 LAB — CBC WITH DIFFERENTIAL/PLATELET
Abs Immature Granulocytes: 0.03 K/uL (ref 0.00–0.07)
Basophils Absolute: 0 K/uL (ref 0.0–0.1)
Basophils Relative: 0 %
Eosinophils Absolute: 0.2 K/uL (ref 0.0–0.5)
Eosinophils Relative: 3 %
HCT: 36.4 % — ABNORMAL LOW (ref 39.0–52.0)
Hemoglobin: 10.9 g/dL — ABNORMAL LOW (ref 13.0–17.0)
Immature Granulocytes: 0 %
Lymphocytes Relative: 22 %
Lymphs Abs: 1.7 K/uL (ref 0.7–4.0)
MCH: 23.9 pg — ABNORMAL LOW (ref 26.0–34.0)
MCHC: 29.9 g/dL — ABNORMAL LOW (ref 30.0–36.0)
MCV: 79.8 fL — ABNORMAL LOW (ref 80.0–100.0)
Monocytes Absolute: 0.8 K/uL (ref 0.1–1.0)
Monocytes Relative: 10 %
Neutro Abs: 5.1 K/uL (ref 1.7–7.7)
Neutrophils Relative %: 65 %
Platelets: 238 K/uL (ref 150–400)
RBC: 4.56 MIL/uL (ref 4.22–5.81)
RDW: 18.7 % — ABNORMAL HIGH (ref 11.5–15.5)
WBC: 7.8 K/uL (ref 4.0–10.5)
nRBC: 0 % (ref 0.0–0.2)

## 2023-09-03 LAB — MAGNESIUM
Magnesium: 1.8 mg/dL (ref 1.7–2.4)
Magnesium: 1.8 mg/dL (ref 1.7–2.4)

## 2023-09-03 LAB — CREATININE, SERUM
Creatinine, Ser: 1.28 mg/dL — ABNORMAL HIGH (ref 0.61–1.24)
GFR, Estimated: >60 mL/min (ref >60)

## 2023-09-03 LAB — HIV ANTIBODY (ROUTINE TESTING W REFLEX): HIV Screen 4th Generation wRfx: NONREACTIVE

## 2023-09-03 LAB — BRAIN NATRIURETIC PEPTIDE: B Natriuretic Peptide: 29.4 pg/mL (ref 0.0–100.0)

## 2023-09-03 LAB — TROPONIN I (HIGH SENSITIVITY)
Troponin I (High Sensitivity): 12 ng/L (ref <18)
Troponin I (High Sensitivity): 11 ng/L (ref <18)

## 2023-09-03 LAB — PHOSPHORUS: Phosphorus: 3.7 mg/dL (ref 2.5–4.6)

## 2023-09-03 LAB — TSH: TSH: 1.715 u[IU]/mL (ref 0.350–4.500)

## 2023-09-03 MED ORDER — FUROSEMIDE 10 MG/ML IJ SOLN
40.0000 mg | Freq: Two times a day (BID) | INTRAMUSCULAR | Status: DC
  Administered 2023-09-03 – 2023-09-04 (×3): 40 mg via INTRAVENOUS
  Filled 2023-09-03 (×3): qty 4

## 2023-09-03 MED ORDER — ACETAMINOPHEN 650 MG RE SUPP: 650.0000 mg | Freq: Four times a day (QID) | RECTAL | Status: DC | PRN

## 2023-09-03 MED ORDER — AMLODIPINE BESYLATE 10 MG PO TABS
10.0000 mg | ORAL_TABLET | Freq: Every day | ORAL | Status: DC
  Administered 2023-09-04: 10 mg via ORAL
  Filled 2023-09-03: qty 1

## 2023-09-03 MED ORDER — POTASSIUM CHLORIDE CRYS ER 20 MEQ PO TBCR
40.0000 meq | EXTENDED_RELEASE_TABLET | Freq: Once | ORAL | Status: AC
  Administered 2023-09-03: 40 meq via ORAL
  Filled 2023-09-03: qty 2

## 2023-09-03 MED ORDER — CARVEDILOL 25 MG PO TABS
25.0000 mg | ORAL_TABLET | Freq: Two times a day (BID) | ORAL | Status: DC
  Administered 2023-09-03 – 2023-09-04 (×2): 25 mg via ORAL
  Filled 2023-09-03: qty 2
  Filled 2023-09-03: qty 1

## 2023-09-03 MED ORDER — POTASSIUM CHLORIDE CRYS ER 20 MEQ PO TBCR
40.0000 meq | EXTENDED_RELEASE_TABLET | Freq: Two times a day (BID) | ORAL | Status: AC
Start: 2023-09-03 — End: 2023-09-03
  Administered 2023-09-03 (×2): 40 meq via ORAL
  Filled 2023-09-03 (×2): qty 2

## 2023-09-03 MED ORDER — ONDANSETRON HCL 4 MG PO TABS: 4.0000 mg | ORAL_TABLET | Freq: Four times a day (QID) | ORAL | Status: DC | PRN

## 2023-09-03 MED ORDER — POTASSIUM CHLORIDE 10 MEQ/100ML IV SOLN
10.0000 meq | Freq: Once | INTRAVENOUS | Status: AC
  Administered 2023-09-03: 10 meq via INTRAVENOUS
  Filled 2023-09-03: qty 100

## 2023-09-03 MED ORDER — POTASSIUM CHLORIDE CRYS ER 20 MEQ PO TBCR
40.0000 meq | EXTENDED_RELEASE_TABLET | Freq: Two times a day (BID) | ORAL | Status: AC
  Administered 2023-09-04 (×2): 40 meq via ORAL
  Filled 2023-09-03 (×2): qty 2

## 2023-09-03 MED ORDER — AMLODIPINE BESYLATE 5 MG PO TABS
10.0000 mg | ORAL_TABLET | Freq: Once | ORAL | Status: AC
  Administered 2023-09-03: 10 mg via ORAL
  Filled 2023-09-03: qty 2

## 2023-09-03 MED ORDER — HYDRALAZINE HCL 25 MG PO TABS
50.0000 mg | ORAL_TABLET | Freq: Once | ORAL | Status: AC
  Administered 2023-09-03: 50 mg via ORAL
  Filled 2023-09-03: qty 2

## 2023-09-03 MED ORDER — OXYCODONE HCL 5 MG PO TABS: 5.0000 mg | ORAL_TABLET | ORAL | Status: DC | PRN

## 2023-09-03 MED ORDER — BISACODYL 10 MG RE SUPP: 10.0000 mg | Freq: Every day | RECTAL | Status: DC | PRN

## 2023-09-03 MED ORDER — ONDANSETRON HCL 4 MG/2ML IJ SOLN: 4.0000 mg | Freq: Four times a day (QID) | INTRAMUSCULAR | Status: DC | PRN

## 2023-09-03 MED ORDER — ACETAMINOPHEN 325 MG PO TABS: 650.0000 mg | ORAL_TABLET | Freq: Four times a day (QID) | ORAL | Status: DC | PRN

## 2023-09-03 MED ORDER — HYDRALAZINE HCL 50 MG PO TABS
50.0000 mg | ORAL_TABLET | Freq: Three times a day (TID) | ORAL | Status: DC
  Administered 2023-09-03 – 2023-09-04 (×3): 50 mg via ORAL
  Filled 2023-09-03 (×3): qty 1

## 2023-09-03 MED ORDER — FENTANYL CITRATE PF 50 MCG/ML IJ SOSY
12.5000 ug | PREFILLED_SYRINGE | INTRAMUSCULAR | Status: DC | PRN
  Administered 2023-09-03 (×2): 12.5 ug via INTRAVENOUS
  Filled 2023-09-03 (×2): qty 1

## 2023-09-03 MED ORDER — SPIRONOLACTONE 25 MG PO TABS
25.0000 mg | ORAL_TABLET | Freq: Two times a day (BID) | ORAL | Status: DC
  Administered 2023-09-03 – 2023-09-04 (×2): 25 mg via ORAL
  Filled 2023-09-03 (×2): qty 1

## 2023-09-03 MED ORDER — FUROSEMIDE 10 MG/ML IJ SOLN
40.0000 mg | INTRAMUSCULAR | Status: AC
  Administered 2023-09-03: 40 mg via INTRAVENOUS
  Filled 2023-09-03: qty 4

## 2023-09-03 MED ORDER — HEPARIN SODIUM (PORCINE) 5000 UNIT/ML IJ SOLN
5000.0000 [IU] | Freq: Three times a day (TID) | INTRAMUSCULAR | Status: DC
  Administered 2023-09-03 – 2023-09-04 (×4): 5000 [IU] via SUBCUTANEOUS
  Filled 2023-09-03 (×4): qty 1

## 2023-09-03 MED ORDER — DOCUSATE SODIUM 100 MG PO CAPS
100.0000 mg | ORAL_CAPSULE | Freq: Two times a day (BID) | ORAL | Status: DC
  Administered 2023-09-03 – 2023-09-04 (×3): 100 mg via ORAL
  Filled 2023-09-03 (×3): qty 1

## 2023-09-03 MED ORDER — HYDRALAZINE HCL 20 MG/ML IJ SOLN
10.0000 mg | Freq: Four times a day (QID) | INTRAMUSCULAR | Status: DC | PRN
Start: 2023-09-03 — End: 2023-09-04

## 2023-09-03 MED ORDER — LEVALBUTEROL HCL 0.63 MG/3ML IN NEBU: 0.6300 mg | INHALATION_SOLUTION | Freq: Four times a day (QID) | RESPIRATORY_TRACT | Status: DC | PRN

## 2023-09-03 MED ORDER — CARVEDILOL 12.5 MG PO TABS
25.0000 mg | ORAL_TABLET | Freq: Once | ORAL | Status: AC
  Administered 2023-09-03: 25 mg via ORAL
  Filled 2023-09-03: qty 2

## 2023-09-03 NOTE — Consult Note (Signed)
 Cardiology Consultation  Patient ID: Jared Mccoy MRN: [id number]; DOB: [birth year]  Admit date: 09/02/2023 Date of Consult: 09/03/2023  PCP:  Celestia Rosaline SQUIBB, NP   [facility] Providers Cardiologist:  New  Patient Profile: Jared Mccoy is a 33 y.o. adult with a hx of hypertension, OSA, paroxysmal atrial fibrillation who is being seen 09/03/2023 for the evaluation of worsening lower extremity edema at the request of Dr. Marygrace.  History of Present Illness: Jared Mccoy has past medical history as stated above.  He presented to the [facility] ED on 09/02/2023 complaining of leg swelling.  He reports that he has a longstanding history of leg swelling, of note he is diagnosed with lymphedema.  But he reports that his leg swelling has gotten acutely worse over the last week.  He states that over the last 24 hours prior to arrival his legs began weeping clear fluid and began feeling extremely heavy.  He reports some associated shortness of breath, denies any cough, fever, chest pain.  Patient reports noncompliance with his medications, including diuretics/antihypertensives.  In the past he was previously on Lasix  however was discontinued due to persistent hypokalemia.  His PCP recently started him on spironolactone  25 mg BID for his bilateral edema.  He was also sent an ambulatory referral to peripheral vein, he has an appointment scheduled September 18, 2023.  Relevant workup in the ED/since admitted includes: CBC stable to prior, CMP showed hypokalemia 2.7 > 3.0, creatinine elevated at 1.4 > 1.3 , albumin 3.0, troponin negative x 2, BNP normal at 29 (lower than last month at 43), mag 1.8, CXR showed stable cardiomegaly, no interstitial edema, pending lower extremity venous Doppler, EKG shows sinus rhythm today.  Echocardiogram from June 2025 showed: EF 65 to 70%, severe LVH, normal-appearing RV, no significant valvular abnormalities, dilated IVC.  Patient was admitted to the medicine  service, cardiology was asked to consult in the setting of LE edema.  It appears that a wound care consult was also placed, for the patient's chronic lymphedema.  He was recently seen in our atrial fibrillation clinic 08/11/2023 for new onset atrial fibrillation.  This diagnosis was made after presenting to the emergency department with symptoms of chest tightness, palpitations.  He was started on IV diltiazem  and resume his home Coreg  dose while in the hospital and converted to sinus rhythm.  At this time UDS was positive for cocaine, THC.  At that time the patient was not started on anticoagulation.  At this visit he reported that he had not had any further palpitations, and he was in sinus rhythm at this visit.  He reports that he was waiting for his BiPAP machine to arrive for his OSA/OHS.  At this visit it was decided that his paroxysmal atrial fibrillation was likely secondary to obesity, OSA, polysubstance abuse.  At this time he was noted to not be a candidate for an ablation and aggressive lifestyle modifications were discussed.  Anticoagulation was still not indicated with a low CHA2DS2-VASc score of 1.  He was continued on Coreg  25 mg BID.  Of note his blood pressure at this appointment was 162/100 he was referred to the hypertension clinic. He no-showed his appointment on 7/10 and never followed up.  After speaking with the patient, he tells me that his PO Lasix  was stopped about a year ago, and he was never actually able to start the spironolactone  he was supposed to start this past June as the pharmacy did not have it  when he went to pick it up.  He tells me that over the past year, especially without a diuretic on board, he believes that he was having slowly worsening lower extremity edema.  Eventually leading up to this past week where he started having diffuse weeping of his lower extremities. Otherwise he tells me that he is compliant with his other blood pressure medications, and says that the  only reason his blood pressure was high upon arrival was due to the fact that he did not have his medications quite yet.  He endorses some exertional shortness of breath, unsure if it is necessarily worse than his baseline but he does believe that it has been getting worse over the past couple of months.  Past Medical History:  Diagnosis Date   Hypertension    not on medication   Obese    Peripheral vascular disease (HCC)    edema in legs    Past Surgical History:  Procedure Laterality Date   MANDIBULAR HARDWARE REMOVAL N/A 12/27/2016   Procedure: MANDIBULAR HARDWARE REMOVAL;  Surgeon: Karis Clunes, MD;  Location: [facility] OR;  Service: ENT;  Laterality: N/A;   OPEN REDUCTION INTERNAL FIXATION (ORIF) DISTAL RADIAL FRACTURE Left 11/10/2016   Procedure: OPEN REDUCTION INTERNAL FIXATION (ORIF) DISTAL RADIAL FRACTURE;  Surgeon: Sebastian Lenis, MD;  [facility];  Service: Orthopedics;  Laterality: Left;   ORIF MANDIBULAR FRACTURE Right 11/10/2016   Procedure: OPEN REDUCTION INTERNAL FIXATION (ORIF) MANDIBULAR FRACTURE, Ear Laceration Repair;  Surgeon: Karis Clunes, MD;  Location: [facility] OR;  Service: ENT;  Laterality: Right;    Home Medications:  Prior to Admission medications   Medication Sig Start Date End Date Taking? Authorizing Provider  amLODipine  (NORVASC ) 10 MG tablet Take 1 tablet (10 mg total) by mouth daily. 07/29/23  Yes Celestia Rosaline SQUIBB, NP  carvedilol  (COREG ) 25 MG tablet Take 1 tablet (25 mg total) by mouth 2 (two) times daily with a meal. 07/29/23  Yes Celestia Rosaline SQUIBB, NP  hydrALAZINE  (APRESOLINE ) 50 MG tablet Take 1 tablet (50 mg total) by mouth 3 (three) times daily. 07/29/23  Yes Celestia Rosaline SQUIBB, NP  spironolactone  (ALDACTONE ) 25 MG tablet Take 1 tablet (25 mg total) by mouth 2 (two) times daily. 07/29/23  Yes Celestia Rosaline SQUIBB, NP   Scheduled Meds:  [START ON 09/04/2023] amLODipine   10 mg Oral Daily   carvedilol   25 mg Oral BID WC   docusate sodium   100 mg Oral BID   furosemide    40 mg Intravenous Q12H   heparin   5,000 Units Subcutaneous Q8H   hydrALAZINE   50 mg Oral TID   potassium chloride   40 mEq Oral BID   spironolactone   25 mg Oral BID   Continuous Infusions:  PRN Meds: acetaminophen  **OR** acetaminophen , bisacodyl , fentaNYL  (SUBLIMAZE ) injection, hydrALAZINE , levalbuterol , ondansetron  **OR** ondansetron  (ZOFRAN ) IV, oxyCODONE  Allergies:    Allergies  Allergen Reactions   Crab (Diagnostic) Diarrhea and Nausea And Vomiting    Reaction only to crab legs    Social History:   Social History   Socioeconomic History   Marital status: Single    Spouse name: Not on file   Number of children: Not on file   Years of education: Not on file   Highest education level: Not on file  Occupational History   Not on file  Tobacco Use   Smoking status: Every Day    Current packs/day: 0.50    Average packs/day: 0.5 packs/day for 20.0 years (10.0 ttl pk-yrs)  Types: Cigarettes   Smokeless tobacco: Never   Tobacco comments:    I pack of cigarettes a day. 07/09/2023  Vaping Use   Vaping status: Never Used  Substance and Sexual Activity   Alcohol use: Yes    Comment: 12/26/16- 1 fifth a week   Drug use: No   Sexual activity: Yes  Other Topics Concern   Not on file  Social History Narrative   ** Merged History Encounter **       Social Drivers of Health   Financial Resource Strain: Not on file  Food Insecurity: No Food Insecurity (07/12/2023)   Hunger Vital Sign    Worried About Running Out of Food in the Last Year: Never true    Ran Out of Food in the Last Year: Never true  Transportation Needs: No Transportation Needs (07/12/2023)   PRAPARE - Administrator, Civil Service (Medical): No    Lack of Transportation (Non-Medical): No  Physical Activity: Not on file  Stress: Not on file  Social Connections: Not on file  Intimate Partner Violence: Not At Risk (07/12/2023)   Humiliation, Afraid, Rape, and Kick questionnaire    Fear of Current or  Ex-Partner: No    Emotionally Abused: No    Physically Abused: No    Sexually Abused: No    Family History:   History reviewed. No pertinent family history.   ROS:  Please see the history of present illness.  All other ROS reviewed and negative.     Physical Exam/Data: Vitals:   09/03/23 0800 09/03/23 0834 09/03/23 1200 09/03/23 1201  BP: (!) 175/102 (!) 158/91 (!) 166/90   Pulse: 77 80 72   Resp:  18 12   Temp:  98.4 F (36.9 C)  98.1 F (36.7 C)  TempSrc:  Oral  Oral  SpO2: 94% 97% 100%   Weight:      Height:       Intake/Output Summary (Last 24 hours) at 09/03/2023 1306 Last data filed at 09/03/2023 0509 Gross per 24 hour  Intake 100 ml  Output 1350 ml  Net -1250 ml      09/02/2023    7:24 PM 08/11/2023    9:15 AM 07/29/2023    2:27 PM  Last 3 Weights  Weight (lbs) 540 lb 550 lb 3.2 oz 536 lb 3.2 oz  Weight (kg) 244.942 kg 249.569 kg 243.219 kg     Body mass index is 62.4 kg/m.   General:  morbidly obese male, on room air, in no acute distress  HEENT: normal Neck: unable to assess JVD due to body habitus Vascular: Distal pulses 2+ bilaterally Cardiac:  RRR; no murmur  Lungs:  distant lung sounds  Abd: soft, nontender, no hepatomegaly  Ext: chronic venous stasis, lymphedema noted, bilateral LE dressings applied to aid with significant weeping, non-healing wounds present on posterior side  Musculoskeletal:   BUE and BLE strength normal and equal Neuro:   no focal abnormalities noted Psych:  Normal affect   EKG:  The EKG was personally reviewed and demonstrates:  sinus rhythm, HR 76, no acute ischemic changes   Telemetry:  Telemetry was personally reviewed and demonstrates:  sinus rhythm, HR 70s  Relevant CV Studies:  Echocardiogram, 07/13/2023 Left ventricular ejection fraction, by estimation, is 65 to 70% . The left ventricle has normal function. Left ventricular endocardial border not optimally defined to evaluate regional wall motion. There is severe left  ventricular hypertrophy. Left ventricular diastolic parameters were normal.  RV  not well visualized, grossly appears normal in size and function. . Right ventricular systolic function was not well visualized. The right ventricular size is not well visualized. Tricuspid regurgitation signal is inadequate for assessing PA pressure.  The mitral valve was not well visualized. No evidence of mitral valve regurgitation. No evidence of mitral stenosis.  The aortic valve was not well visualized. Aortic valve regurgitation is not visualized. No aortic stenosis is present.  Aortic dilatation noted. There is mild dilatation of the ascending aorta, measuring 39 mm.  The inferior vena cava is dilated in size with > 50% respiratory variability, suggesting right atrial pressure of 8 mmHg.  Laboratory Data: High Sensitivity Troponin:   Recent Labs  Lab 09/03/23 0104 09/03/23 0957  TROPONINIHS 12 11     Chemistry Recent Labs  Lab 09/02/23 1931 09/03/23 0104 09/03/23 0957  NA 141  --  141  K 2.7*  --  3.0*  CL 101  --  100  CO2 31  --  32  GLUCOSE 99  --  110*  BUN 10  --  10  CREATININE 1.40*  --  1.30*  CALCIUM 8.7*  --  8.3*  MG  --  1.8 1.8  GFRNONAA >60  --  >60  ANIONGAP 9  --  9    Recent Labs  Lab 09/02/23 1931 09/03/23 0957  PROT 7.8 6.7  ALBUMIN 3.4* 3.0*  AST 17 14*  ALT 17 15  ALKPHOS 77 67  BILITOT 0.4 0.4   Lipids No results for input(s): CHOL, TRIG, HDL, LABVLDL, LDLCALC, CHOLHDL in the last 168 hours.  Hematology Recent Labs  Lab 09/02/23 1931 09/03/23 0957  WBC 7.7 7.8  RBC 5.08 4.56  HGB 11.9* 10.9*  HCT 40.5 36.4*  MCV 79.7* 79.8*  MCH 23.4* 23.9*  MCHC 29.4* 29.9*  RDW 18.8* 18.7*  PLT 270 238   Thyroid  No results for input(s): TSH, FREET4 in the last 168 hours.  BNP Recent Labs  Lab 09/03/23 0104  BNP 29.4    DDimer No results for input(s): DDIMER in the last 168 hours.  Radiology/Studies:  VAS US  LOWER EXTREMITY VENOUS  (DVT) Result Date: 09/03/2023  Lower Venous DVT Study Patient Name:  Jared Mccoy  Date of Exam:   09/03/2023 Medical Rec #: [id number]        Accession #:    [id number] Date of Birth: [birth year]         Patient Gender: M Patient Age:   65 years Exam Location:  [facility] Procedure:      VAS US  LOWER EXTREMITY VENOUS (DVT) Referring Phys: ALEJANDRO MARKER --------------------------------------------------------------------------------  Indications: Edema.  Risk Factors: Lymphedema, medical noncompliance, obesity (BMI 62.40). Limitations: Body habitus and poor ultrasound/tissue interface. Comparison Study: RLEV on 10/09/2020 & LLEV on 04/13/3033 were negative for DVT Performing Technologist: Ezzie Potters RVT, RDMS  Examination Guidelines: A complete evaluation includes B-mode imaging, spectral Doppler, color Doppler, and power Doppler as needed of all accessible portions of each vessel. Bilateral testing is considered an integral part of a complete examination. Limited examinations for reoccurring indications may be performed as noted. The reflux portion of the exam is performed with the patient in reverse Trendelenburg.  +---------+---------------+---------+-----------+----------+-------------------+ RIGHT    CompressibilityPhasicitySpontaneityPropertiesThrombus Aging      +---------+---------------+---------+-----------+----------+-------------------+ CFV      Full           Yes      Yes                                      +---------+---------------+---------+-----------+----------+-------------------+  SFJ      Full                                                             +---------+---------------+---------+-----------+----------+-------------------+ FV Prox  Full           Yes      Yes                                      +---------+---------------+---------+-----------+----------+-------------------+ FV Mid   Full           Yes      Yes                                       +---------+---------------+---------+-----------+----------+-------------------+ FV DistalFull           Yes      Yes                                      +---------+---------------+---------+-----------+----------+-------------------+ PFV      Full                                                             +---------+---------------+---------+-----------+----------+-------------------+ POP      Full           Yes      Yes                                      +---------+---------------+---------+-----------+----------+-------------------+ PTV                                                   unable to visualize +---------+---------------+---------+-----------+----------+-------------------+ PERO                                                  unable to visualize +---------+---------------+---------+-----------+----------+-------------------+   +--------+---------------+---------+-----------+----------+--------------------+ LEFT    CompressibilityPhasicitySpontaneityPropertiesThrombus Aging       +--------+---------------+---------+-----------+----------+--------------------+ CFV     Full           Yes      Yes                                       +--------+---------------+---------+-----------+----------+--------------------+ SFJ     Full                                                              +--------+---------------+---------+-----------+----------+--------------------+  FV Prox Full           Yes      Yes                                       +--------+---------------+---------+-----------+----------+--------------------+ FV Mid                 Yes      Yes                  patent by                                                                 color/doppler        +--------+---------------+---------+-----------+----------+--------------------+ FV                     Yes      Yes                  patent by             Distal                                               color/doppler        +--------+---------------+---------+-----------+----------+--------------------+ PFV                    Yes      Yes                  patent by                                                                 color/doppler        +--------+---------------+---------+-----------+----------+--------------------+ POP     Full           Yes      Yes                                       +--------+---------------+---------+-----------+----------+--------------------+ PTV                                                  unable to visualize  +--------+---------------+---------+-----------+----------+--------------------+ PERO                                                 unable to visualize  +--------+---------------+---------+-----------+----------+--------------------+     Summary: BILATERAL: -No evidence of popliteal cyst, bilaterally. -Bilateral subcutaneous edema. RIGHT: - There is  no evidence of deep vein thrombosis in the lower extremity. However, portions of this examination were limited- see technologist comments above.  LEFT: - There is no evidence of deep vein thrombosis in the lower extremity. However, portions of this examination were limited- see technologist comments above.  - Ultrasound characteristics of enlarged lymph nodes noted in the groin.  *See table(s) above for measurements and observations.    Preliminary    DG Chest Port 1 View Result Date: 09/03/2023 EXAM: 1 VIEW XRAY OF THE CHEST 09/03/2023 12:44:00 AM COMPARISON: 07/12/2023 CLINICAL HISTORY: SOB, CHF. Encounter for shortness of breath and congestive heart failure. FINDINGS: LUNGS AND PLEURA: No focal pulmonary opacity. No frank interstitial edema. No pleural effusion. No pneumothorax. HEART AND MEDIASTINUM: Stable cardiomegaly. No acute abnormality of the cardiac and mediastinal silhouettes. BONES AND SOFT TISSUES: No acute  osseous abnormality. IMPRESSION: 1. Stable cardiomegaly. 2. No frank interstitial edema. Electronically signed by: Pinkie Pebbles MD 09/03/2023 12:56 AM EDT RP Workstation: [id number]   Assessment and Plan:  Chronic bilateral LE edema Lymphedema  Known history of chronic LE edema Patient reports worsening over last week  Legs are now consistently weeping thru dressings  Seen by PCP 07/29/2023, instructed to start spironolactone  and see PV 8/7 Never picked up spironolactone  at pharmacy  BNP normal at 29 CXR showed stable cardiomegaly, no interstitial edema Echo 07/2023 showed EF 65-70%, normal RV, no valvular abnormalities, dilated IVC Preliminary LE doppler was negative for DVTs Patient seen by wound care for lymphedema -- they ordered Unna boots and gave resources for outpatient treatment Patient given IV Lasix  40 mg x 1, has history of hypokalemia with Lasix  and presented with potassium level 2.7  It appears that his LE edema is mainly driven by chronic lymphedema which would benefit from more compressive measures as opposed to strong diuresis Continue with wound care and Unna boots   Hypertension Home meds: amlodipine  10 mg daily, Coreg  25 mg BID, hydralazine  50 mg TID, spironolactone  25 mg BID Reported history of noncompliance, however dispense report shows that he has filled his medications as of recently He reports only not taking his spironolactone  as he says the pharmacy did not have it in stock   Presented with BP as high as 224/121 Most recent BP 158/91 Given home amlodipine , Coreg , hydralazine  and IV Lasix  40 mg x 1 dose  Continue home medications as above   Paroxysmal atrial fibrillation Known history of paroxysmal atrial fibrillation See by our A. Fib clinic 07/2023  Suspected to be secondary to obesity, OSA, polysubstance abuse   Never started on anticoagulation due to low CHA2DS2-VASc score  Started on Coreg  25 mg BID  Determined to not be a candidate for  ablation UDS historically positive for cocaine, pending updated UDS Currently in NSR with HR 70s   Per primary Medication noncompliance  Electrolyte disturbances Polysubstance abuse  OSA/OHS Hypoalbuminemia Class III obesity    Risk Assessment/Risk Scores:       CHA2DS2-VASc Score = 1   This indicates a 0.6% annual risk of stroke. The patient's score is based upon: CHF History: 0 HTN History: 1 Diabetes History: 0 Stroke History: 0 Vascular Disease History: 0 Age Score: 0 Gender Score: 0       For questions or updates, please contact [facility] Please consult [email or web address] for contact info under    Signed, Waddell DELENA Donath, PA-C  09/03/2023 1:06 PM

## 2023-09-03 NOTE — Progress Notes (Signed)
 Patient brought to 4E from ED. Telemetry box applied, CCMD notified. Bilateral LE edema with open wounds bilaterally. Pictures taken and added to chart. Measurements taken. Aquacel ordered per wound care order. Wound dressings changed bilaterally. Patient oriented to room and staff. Call bell in reach. Patient wife present.    09/03/23 1716  Vitals  Temp 98 F (36.7 C)  Temp Source Oral  BP (!) 195/116  MAP (mmHg) 133  BP Location Right Wrist  BP Method Automatic  Patient Position (if appropriate) Sitting  Pulse Rate 92  Pulse Rate Source Monitor  ECG Heart Rate 92  Resp 18  Level of Consciousness  Level of Consciousness Alert  MEWS COLOR  MEWS Score Color Green  Oxygen Therapy  SpO2 96 %  O2 Device Room Air  MEWS Score  MEWS Temp 0  MEWS Systolic 0  MEWS Pulse 0  MEWS RR 0  MEWS LOC 0  MEWS Score 0

## 2023-09-03 NOTE — Progress Notes (Signed)
 Orthopedic Tech Progress Note Patient Details:  Jared Mccoy 09-15-1990 [id number]  Ortho Devices Type of Ortho Device: Nonie boot Ortho Device/Splint Location: BLE Ortho Device/Splint Interventions: Ordered, Application, Adjustment   Post Interventions Patient Tolerated: Well Instructions Provided: Care of device   Chloeanne Poteet L [name] 09/03/2023, 10:51 PM

## 2023-09-03 NOTE — H&P (Signed)
 History and Physical    Patient: Jared Mccoy FMW:[id number] DOB: 1990-05-17 DOA: 09/02/2023 DOS: the patient was seen and examined on 09/03/2023 PCP: Celestia Rosaline SQUIBB, NP  Patient coming from: Home  Chief Complaint:  Chief Complaint  Patient presents with   Leg Swelling   HPI: Jared Mccoy is a super morbidly obese 33 y.o. adult with medical history significant for but not limited to peripheral vascular disease, essential hypertension, paroxysmal atrial fibrillation not on anticoagulation as well as other comorbidities who presented to the hospital with worsening pain in his lower extremities as well as worsening swelling with weeping.  He states that this has been progressively worsening for last week or so but over the last few days it acutely worsened in the setting of the last day significantly started weeping.  States that legs were so swollen they started weeping clear fluid and states that they feel extremely heavy.  He did report some mild shortness of breath but denied a cough or fever.  Currently has not been compliant with his diuretics and recently was taken off of Lasix  due to his persistent hypokalemia and was supposed to be switched to spironolactone .  Denies any chest discomfort, burning or discomfort in his urine, lightheadedness or dizziness.  Given his symptoms this prompted him to be evaluated in the ED and [facility] is now admitting this patient for worsening right extremity swelling and cardiology has been consulted and given his volume overload for further evaluation.   Review of Systems: As mentioned in the history of present illness. All other systems reviewed and are negative. Past Medical History:  Diagnosis Date   Hypertension    not on medication   Obese    Peripheral vascular disease (HCC)    edema in legs    Past Surgical History:  Procedure Laterality Date   MANDIBULAR HARDWARE REMOVAL N/A 12/27/2016   Procedure: MANDIBULAR HARDWARE REMOVAL;  Surgeon:  Karis Clunes, MD;  Location: [facility] OR;  Service: ENT;  Laterality: N/A;   OPEN REDUCTION INTERNAL FIXATION (ORIF) DISTAL RADIAL FRACTURE Left 11/10/2016   Procedure: OPEN REDUCTION INTERNAL FIXATION (ORIF) DISTAL RADIAL FRACTURE;  Surgeon: Sebastian Lenis, MD;  Location[facility];  Service: Orthopedics;  Laterality: Left;   ORIF MANDIBULAR FRACTURE Right 11/10/2016   Procedure: OPEN REDUCTION INTERNAL FIXATION (ORIF) MANDIBULAR FRACTURE, Ear Laceration Repair;  Surgeon: Karis Clunes, MD;  Location: [facility] OR;  Service: ENT;  Laterality: Right;   Social History:  reports that he has been smoking cigarettes. He has a 10 pack-year smoking history. He has never used smokeless tobacco. He reports current alcohol use. He reports that he does not use drugs.  Allergies  Allergen Reactions   Crab (Diagnostic) Diarrhea and Nausea And Vomiting    Reaction only to crab legs    Family History History reviewed. No pertinent family history to presenting complaint.  Prior to Admission medications   Medication Sig Start Date End Date Taking? Authorizing Provider  amLODipine  (NORVASC ) 10 MG tablet Take 1 tablet (10 mg total) by mouth daily. 07/29/23  Yes Celestia Rosaline SQUIBB, NP  carvedilol  (COREG ) 25 MG tablet Take 1 tablet (25 mg total) by mouth 2 (two) times daily with a meal. 07/29/23  Yes Celestia Rosaline SQUIBB, NP  hydrALAZINE  (APRESOLINE ) 50 MG tablet Take 1 tablet (50 mg total) by mouth 3 (three) times daily. 07/29/23  Yes Celestia Rosaline SQUIBB, NP  spironolactone  (ALDACTONE ) 25 MG tablet Take 1 tablet (25 mg total) by mouth 2 (two) times daily.  07/29/23  Yes Celestia Rosaline SQUIBB, NP    Physical Exam: Vitals:   09/03/23 1630 09/03/23 1716 09/03/23 1934 09/03/23 2132  BP: (!) 183/88 (!) 195/116 (!) 175/86 (!) 154/82  Pulse: 73 92 69   Resp: 20 18 20    Temp:  98 F (36.7 C) 98.1 F (36.7 C)   TempSrc:  Oral Oral   SpO2: 98% 96% 91%   Weight:      Height:       Vitals:   09/03/23 1934 09/03/23 2132  BP: (!) 175/86 (!)  154/82  Pulse: 69   Resp: 20   Temp: 98.1 F (36.7 C)   SpO2: 91%    Examination: Physical Exam:  Constitutional: WN/WD super morbidly obese African-American male who is a little somnolent and drowsy Respiratory: Diminished to auscultation bilaterally, no wheezing, rales, rhonchi or crackles. Normal respiratory effort and patient is not tachypenic. No accessory muscle use.  Unlabored breathing Cardiovascular: RRR, no murmurs / rubs / gallops. S1 and S2 auscultated.  Has leg swelling and weeping along with some chronic venous stasis changes of his lower legs and ankles Abdomen: Soft, non-tender, distended secondary to body habitus bowel sounds positive.  GU: Deferred. Musculoskeletal: No clubbing / cyanosis of digits/nails.  No apparent joint deformities noted Skin: Has an open wound to the right posterior leg with significant edema Neurologic: CN 2-12 grossly intact with no focal deficits but is little somnolent and drowsy. Romberg sign cerebellar reflexes not assessed.  Psychiatric: Patient appears calm and is a little bit sleepy  Data Reviewed: Recent Results (from the past 2160 hours)  Basic metabolic panel     Status: Abnormal   Collection Time: 06/29/23  1:54 AM  Result Value Ref Range   Sodium 137 135 - 145 mmol/L   Potassium 2.9 (L) 3.5 - 5.1 mmol/L   Chloride 100 98 - 111 mmol/L   CO2 29 22 - 32 mmol/L   Glucose, Bld 103 (H) 70 - 99 mg/dL    Comment: Glucose reference range applies only to samples taken after fasting for at least 8 hours.   BUN 17 6 - 20 mg/dL   Creatinine, Ser 8.63 (H) 0.61 - 1.24 mg/dL   Calcium 8.5 (L) 8.9 - 10.3 mg/dL   GFR, Estimated >39 >39 mL/min    Comment: (NOTE) Calculated using the CKD-EPI Creatinine Equation (2021)    Anion gap 8 5 - 15    Comment: Performed at [facility], [address], [place], [place] [address]  CBC     Status: Abnormal   Collection Time: 06/29/23  1:54 AM  Result Value Ref Range   WBC 8.0 4.0 -  10.5 K/uL   RBC 4.57 4.22 - 5.81 MIL/uL   Hemoglobin 10.9 (L) 13.0 - 17.0 g/dL   HCT 63.5 (L) 60.9 - 47.9 %   MCV 79.6 (L) 80.0 - 100.0 fL   MCH 23.9 (L) 26.0 - 34.0 pg   MCHC 29.9 (L) 30.0 - 36.0 g/dL   RDW 81.6 (H) 88.4 - 84.4 %   Platelets 271 150 - 400 K/uL   nRBC 0.0 0.0 - 0.2 %    Comment: Performed at [facility], [address], [place], [place] [address]  Troponin I (High Sensitivity)     Status: Abnormal   Collection Time: 06/29/23  1:54 AM  Result Value Ref Range   Troponin I (High Sensitivity) 20 (H) <18 ng/L    Comment: (NOTE) Elevated high sensitivity troponin  I (hsTnI) values and significant  changes across serial measurements may suggest ACS but many other  chronic and acute conditions are known to elevate hsTnI results.  Refer to the Links section for chest pain algorithms and additional  guidance. Performed at [facility], [address] [place], [place] [address]   Brain natriuretic peptide     Status: None   Collection Time: 06/29/23  1:54 AM  Result Value Ref Range   B Natriuretic Peptide 37.7 0.0 - 100.0 pg/mL    Comment: Performed at [facility], [address], [place], [place] [address]  Blood gas, venous (at [facility] and [facility])     Status: Abnormal   Collection Time: 06/29/23  2:07 AM  Result Value Ref Range   pH, Ven 7.4 7.25 - 7.43   pCO2, Ven 57 44 - 60 mmHg   pO2, Ven 56 (H) 32 - 45 mmHg   Bicarbonate 35.3 (H) 20.0 - 28.0 mmol/L   Acid-Base Excess 8.5 (H) 0.0 - 2.0 mmol/L   O2 Saturation 89.8 %   Patient temperature 37.0     Comment: Performed at [facility], [address], [place], [place] [address]  Troponin I (High Sensitivity)     Status: Abnormal   Collection Time: 06/29/23  3:54 AM  Result Value Ref Range   Troponin I (High Sensitivity) 18 (H) <18 ng/L    Comment: (NOTE) Elevated high sensitivity troponin I (hsTnI) values and significant  changes across serial  measurements may suggest ACS but many other  chronic and acute conditions are known to elevate hsTnI results.  Refer to the Links section for chest pain algorithms and additional  guidance. Performed at [facility], [address], [place], [place] [address]   Basic metabolic panel     Status: Abnormal   Collection Time: 07/12/23  8:16 AM  Result Value Ref Range   Sodium 138 135 - 145 mmol/L   Potassium 2.8 (L) 3.5 - 5.1 mmol/L   Chloride 98 98 - 111 mmol/L   CO2 30 22 - 32 mmol/L   Glucose, Bld 123 (H) 70 - 99 mg/dL    Comment: Glucose reference range applies only to samples taken after fasting for at least 8 hours.   BUN 13 6 - 20 mg/dL   Creatinine, Ser 8.66 (H) 0.61 - 1.24 mg/dL   Calcium 8.6 (L) 8.9 - 10.3 mg/dL   GFR, Estimated >39 >39 mL/min    Comment: (NOTE) Calculated using the CKD-EPI Creatinine Equation (2021)    Anion gap 10 5 - 15    Comment: Performed at [facility], [address] [place], [place] [address]  CBC with Differential     Status: Abnormal   Collection Time: 07/12/23  8:16 AM  Result Value Ref Range   WBC 8.2 4.0 - 10.5 K/uL   RBC 4.98 4.22 - 5.81 MIL/uL   Hemoglobin 11.6 (L) 13.0 - 17.0 g/dL   HCT 60.4 60.9 - 47.9 %   MCV 79.3 (L) 80.0 - 100.0 fL   MCH 23.3 (L) 26.0 - 34.0 pg   MCHC 29.4 (L) 30.0 - 36.0 g/dL   RDW 81.6 (H) 88.4 - 84.4 %   Platelets 256 150 - 400 K/uL   nRBC 0.0 0.0 - 0.2 %   Neutrophils Relative % 63 %   Neutro Abs 5.2 1.7 - 7.7 K/uL   Lymphocytes Relative 27 %   Lymphs Abs 2.2 0.7 -  4.0 K/uL   Monocytes Relative 7 %   Monocytes Absolute 0.6 0.1 - 1.0 K/uL   Eosinophils Relative 3 %   Eosinophils Absolute 0.2 0.0 - 0.5 K/uL   Basophils Relative 0 %   Basophils Absolute 0.0 0.0 - 0.1 K/uL   Immature Granulocytes 0 %   Abs Immature Granulocytes 0.03 0.00 - 0.07 K/uL    Comment: Performed at [facility], [address] [place], [place] [address]  Brain natriuretic peptide     Status:  None   Collection Time: 07/12/23  8:16 AM  Result Value Ref Range   B Natriuretic Peptide 43.7 0.0 - 100.0 pg/mL    Comment: Performed at [facility], [address], [place], [place] [address]  Magnesium      Status: None   Collection Time: 07/12/23  8:16 AM  Result Value Ref Range   Magnesium  1.8 1.7 - 2.4 mg/dL    Comment: Performed at [facility], [address] [place], [place] [address]  I-Stat venous blood gas, [facility], [facility], [facility])     Status: Abnormal   Collection Time: 07/12/23  8:45 AM  Result Value Ref Range   pH, Ven 7.395 7.25 - 7.43   pCO2, Ven 53.8 44 - 60 mmHg   pO2, Ven 58 (H) 32 - 45 mmHg   Bicarbonate 32.9 (H) 20.0 - 28.0 mmol/L   TCO2 35 (H) 22 - 32 mmol/L   O2 Saturation 89 %   Acid-Base Excess 6.0 (H) 0.0 - 2.0 mmol/L   Sodium 142 135 - 145 mmol/L   Potassium 2.6 (LL) 3.5 - 5.1 mmol/L   Calcium, Ion 1.12 (L) 1.15 - 1.40 mmol/L   HCT 40.0 39.0 - 52.0 %   Hemoglobin 13.6 13.0 - 17.0 g/dL   Sample type VENOUS    Comment NOTIFIED PHYSICIAN   TSH     Status: None   Collection Time: 07/12/23  2:54 PM  Result Value Ref Range   TSH 3.030 0.350 - 4.500 uIU/mL    Comment: Performed by a 3rd Generation assay with a functional sensitivity of <=0.01 uIU/mL. Performed at [facility], [address], [place], [place] [address]   Hemoglobin A1c     Status: None   Collection Time: 07/12/23  2:54 PM  Result Value Ref Range   Hgb A1c MFr Bld 5.5 4.8 - 5.6 %    Comment: (NOTE) Diagnosis of Diabetes The following HbA1c ranges recommended by the American Diabetes Association (ADA) may be used as an aid in the diagnosis of diabetes mellitus.  Hemoglobin             Suggested A1C NGSP%              Diagnosis  <5.7                   Non Diabetic  5.7-6.4                Pre-Diabetic  >6.4                   Diabetic  <7.0                   Glycemic control for                       adults with diabetes.     Mean Plasma Glucose 111.15 mg/dL     Comment: Performed  at [facility], [address] [place], [place] [address]  Lipid panel     Status: Abnormal   Collection Time: 07/12/23  2:54 PM  Result Value Ref Range   Cholesterol 164 0 - 200 mg/dL   Triglycerides 65 <849 mg/dL   HDL 39 (L) >59 mg/dL   Total CHOL/HDL Ratio 4.2 RATIO   VLDL 13 0 - 40 mg/dL   LDL Cholesterol 887 (H) 0 - 99 mg/dL    Comment:        Total Cholesterol/HDL:CHD Risk Coronary Heart Disease Risk Table                     Men   Women  1/2 Average Risk   3.4   3.3  Average Risk       5.0   4.4  2 X Average Risk   9.6   7.1  3 X Average Risk  23.4   11.0        Use the calculated Patient Ratio above and the CHD Risk Table to determine the patient's CHD Risk.        ATP III CLASSIFICATION (LDL):  <100     mg/dL   Optimal  899-870  mg/dL   Near or Above                    Optimal  130-159  mg/dL   Borderline  839-810  mg/dL   High  >809     mg/dL   Very High Performed at [facility], [address] [place], [place] [address]   Rapid urine drug screen (hospital performed)     Status: Abnormal   Collection Time: 07/12/23 11:50 PM  Result Value Ref Range   Opiates NONE DETECTED NONE DETECTED   Cocaine POSITIVE (A) NONE DETECTED   Benzodiazepines NONE DETECTED NONE DETECTED   Amphetamines NONE DETECTED NONE DETECTED   Tetrahydrocannabinol POSITIVE (A) NONE DETECTED   Barbiturates NONE DETECTED NONE DETECTED    Comment: (NOTE) DRUG SCREEN FOR MEDICAL PURPOSES ONLY.  IF CONFIRMATION IS NEEDED FOR ANY PURPOSE, NOTIFY LAB WITHIN 5 DAYS.  LOWEST DETECTABLE LIMITS FOR URINE DRUG SCREEN Drug Class                     Cutoff (ng/mL) Amphetamine and metabolites    1000 Barbiturate and metabolites    200 Benzodiazepine                 200 Opiates and metabolites        300 Cocaine and metabolites        300 THC                            50 Performed at [facility], [address] [place], [place] [address]   Basic  metabolic panel     Status: Abnormal   Collection Time: 07/13/23  9:43 AM  Result Value Ref Range   Sodium 139 135 - 145 mmol/L   Potassium 3.2 (L) 3.5 - 5.1 mmol/L   Chloride 100 98 - 111 mmol/L   CO2 31 22 - 32 mmol/L   Glucose, Bld 96 70 - 99 mg/dL    Comment: Glucose reference range applies only to samples taken after fasting for at least 8 hours.   BUN 13 6 - 20 mg/dL   Creatinine, Ser 8.75 0.61 -  1.24 mg/dL   Calcium 8.2 (L) 8.9 - 10.3 mg/dL   GFR, Estimated >39 >39 mL/min    Comment: (NOTE) Calculated using the CKD-EPI Creatinine Equation (2021)    Anion gap 8 5 - 15    Comment: Performed at [facility], [address], [place], [place] [address]  ECHOCARDIOGRAM COMPLETE     Status: None   Collection Time: 07/13/23  2:36 PM  Result Value Ref Range   S' Lateral 3.90 cm   Area-P 1/2 3.68 cm2   AR max vel 4.38 cm2   AV Area mean vel 4.31 cm2   AV Area VTI 4.74 cm2   Est EF 65 - 70%    AV Peak grad 6.7 mmHg   Ao pk vel 1.29 m/s   AV Mean grad 3.2 mmHg  CBC with Differential     Status: Abnormal   Collection Time: 09/02/23  7:31 PM  Result Value Ref Range   WBC 7.7 4.0 - 10.5 K/uL   RBC 5.08 4.22 - 5.81 MIL/uL   Hemoglobin 11.9 (L) 13.0 - 17.0 g/dL   HCT 59.4 60.9 - 47.9 %   MCV 79.7 (L) 80.0 - 100.0 fL   MCH 23.4 (L) 26.0 - 34.0 pg   MCHC 29.4 (L) 30.0 - 36.0 g/dL   RDW 81.1 (H) 88.4 - 84.4 %   Platelets 270 150 - 400 K/uL   nRBC 0.0 0.0 - 0.2 %   Neutrophils Relative % 61 %   Neutro Abs 4.6 1.7 - 7.7 K/uL   Lymphocytes Relative 26 %   Lymphs Abs 2.0 0.7 - 4.0 K/uL   Monocytes Relative 9 %   Monocytes Absolute 0.7 0.1 - 1.0 K/uL   Eosinophils Relative 4 %   Eosinophils Absolute 0.3 0.0 - 0.5 K/uL   Basophils Relative 0 %   Basophils Absolute 0.0 0.0 - 0.1 K/uL   Immature Granulocytes 0 %   Abs Immature Granulocytes 0.03 0.00 - 0.07 K/uL    Comment: Performed at [facility], [address] [place], [place] [address]  Comprehensive metabolic  panel     Status: Abnormal   Collection Time: 09/02/23  7:31 PM  Result Value Ref Range   Sodium 141 135 - 145 mmol/L   Potassium 2.7 (LL) 3.5 - 5.1 mmol/L    Comment: CRITICAL RESULT CALLED TO, READ BACK BY AND VERIFIED WITH S ROLAND,RN 2041 09/02/2023 WBOND   Chloride 101 98 - 111 mmol/L   CO2 31 22 - 32 mmol/L   Glucose, Bld 99 70 - 99 mg/dL    Comment: Glucose reference range applies only to samples taken after fasting for at least 8 hours.   BUN 10 6 - 20 mg/dL   Creatinine, Ser 8.59 (H) 0.61 - 1.24 mg/dL   Calcium 8.7 (L) 8.9 - 10.3 mg/dL   Total Protein 7.8 6.5 - 8.1 g/dL   Albumin 3.4 (L) 3.5 - 5.0 g/dL   AST 17 15 - 41 U/L   ALT 17 0 - 44 U/L   Alkaline Phosphatase 77 38 - 126 U/L   Total Bilirubin 0.4 0.0 - 1.2 mg/dL   GFR, Estimated >39 >39 mL/min    Comment: (NOTE) Calculated using the CKD-EPI Creatinine Equation (2021)    Anion gap 9 5 - 15    Comment: Performed at [facility], [address] [place], [place] [address]  Troponin I (High Sensitivity)     Status: None   Collection  Time: 09/03/23  1:04 AM  Result Value Ref Range   Troponin I (High Sensitivity) 12 <18 ng/L    Comment: (NOTE) Elevated high sensitivity troponin I (hsTnI) values and significant  changes across serial measurements may suggest ACS but many other  chronic and acute conditions are known to elevate hsTnI results.  Refer to the Links section for chest pain algorithms and additional  guidance. Performed at [facility], [address], [place], [place] [address]   Brain natriuretic peptide     Status: None   Collection Time: 09/03/23  1:04 AM  Result Value Ref Range   B Natriuretic Peptide 29.4 0.0 - 100.0 pg/mL    Comment: Performed at [facility], [address], [place], [place] [address]  Magnesium      Status: None   Collection Time: 09/03/23  1:04 AM  Result Value Ref Range   Magnesium  1.8 1.7 - 2.4 mg/dL    Comment: Performed at [facility], [address], [place], [place] [address]  Troponin I (High Sensitivity)     Status: None   Collection Time: 09/03/23  9:57 AM  Result Value Ref Range   Troponin I (High Sensitivity) 11 <18 ng/L    Comment: (NOTE) Elevated high sensitivity troponin I (hsTnI) values and significant  changes across serial measurements may suggest ACS but many other  chronic and acute conditions are known to elevate hsTnI results.  Refer to the Links section for chest pain algorithms and additional  guidance. Performed at [facility], [address] [place], [place] [address]   Comprehensive metabolic panel with GFR     Status: Abnormal   Collection Time: 09/03/23  9:57 AM  Result Value Ref Range   Sodium 141 135 - 145 mmol/L   Potassium 3.0 (L) 3.5 - 5.1 mmol/L   Chloride 100 98 - 111 mmol/L   CO2 32 22 - 32 mmol/L   Glucose, Bld 110 (H) 70 - 99 mg/dL    Comment: Glucose reference range applies only to samples taken after fasting for at least 8 hours.   BUN 10 6 - 20 mg/dL   Creatinine, Ser 8.69 (H) 0.61 - 1.24 mg/dL   Calcium 8.3 (L) 8.9 - 10.3 mg/dL   Total Protein 6.7 6.5 - 8.1 g/dL   Albumin 3.0 (L) 3.5 - 5.0 g/dL   AST 14 (L) 15 - 41 U/L   ALT 15 0 - 44 U/L   Alkaline Phosphatase 67 38 - 126 U/L   Total Bilirubin 0.4 0.0 - 1.2 mg/dL   GFR, Estimated >39 >39 mL/min    Comment: (NOTE) Calculated using the CKD-EPI Creatinine Equation (2021)    Anion gap 9 5 - 15    Comment: Performed at [facility], [address], [place], [place] [address]  CBC with Differential/Platelet     Status: Abnormal   Collection Time: 09/03/23  9:57 AM  Result Value Ref Range   WBC 7.8 4.0 - 10.5 K/uL   RBC 4.56 4.22 - 5.81 MIL/uL   Hemoglobin 10.9 (L) 13.0 - 17.0 g/dL   HCT 63.5 (L) 60.9 - 47.9 %   MCV 79.8 (L) 80.0 - 100.0 fL   MCH 23.9 (L) 26.0 - 34.0 pg   MCHC 29.9 (L) 30.0 - 36.0 g/dL   RDW 81.2 (H) 88.4 - 84.4 %   Platelets 238 150 - 400 K/uL   nRBC 0.0 0.0 - 0.2 %  Neutrophils Relative %  65 %   Neutro Abs 5.1 1.7 - 7.7 K/uL   Lymphocytes Relative 22 %   Lymphs Abs 1.7 0.7 - 4.0 K/uL   Monocytes Relative 10 %   Monocytes Absolute 0.8 0.1 - 1.0 K/uL   Eosinophils Relative 3 %   Eosinophils Absolute 0.2 0.0 - 0.5 K/uL   Basophils Relative 0 %   Basophils Absolute 0.0 0.0 - 0.1 K/uL   Immature Granulocytes 0 %   Abs Immature Granulocytes 0.03 0.00 - 0.07 K/uL    Comment: Performed at [facility], [address], [place], [place] [address]  Magnesium      Status: None   Collection Time: 09/03/23  9:57 AM  Result Value Ref Range   Magnesium  1.8 1.7 - 2.4 mg/dL    Comment: Performed at [facility], [address], [place], [place] [address]  Phosphorus     Status: None   Collection Time: 09/03/23  9:57 AM  Result Value Ref Range   Phosphorus 3.7 2.5 - 4.6 mg/dL    Comment: Performed at [facility], [address] [place], [place] [address]  TSH     Status: None   Collection Time: 09/03/23  9:57 AM  Result Value Ref Range   TSH 1.715 0.350 - 4.500 uIU/mL    Comment: Performed by a 3rd Generation assay with a functional sensitivity of <=0.01 uIU/mL. Performed at [facility], [address] [place], [place] [address]   Rapid urine drug screen (hospital performed)     Status: Abnormal   Collection Time: 09/03/23  1:16 PM  Result Value Ref Range   Opiates NONE DETECTED NONE DETECTED   Cocaine POSITIVE (A) NONE DETECTED   Benzodiazepines NONE DETECTED NONE DETECTED   Amphetamines NONE DETECTED NONE DETECTED   Tetrahydrocannabinol NONE DETECTED NONE DETECTED   Barbiturates NONE DETECTED NONE DETECTED    Comment: (NOTE) DRUG SCREEN FOR MEDICAL PURPOSES ONLY.  IF CONFIRMATION IS NEEDED FOR ANY PURPOSE, NOTIFY LAB WITHIN 5 DAYS.  LOWEST DETECTABLE LIMITS FOR URINE DRUG SCREEN Drug Class                     Cutoff (ng/mL) Amphetamine and metabolites    1000 Barbiturate and metabolites    200 Benzodiazepine                 200 Opiates and  metabolites        300 Cocaine and metabolites        300 THC                            50 Performed at [facility], [address], [place], [place] [address]   HIV Antibody (routine testing w rflx)     Status: None   Collection Time: 09/03/23  1:16 PM  Result Value Ref Range   HIV Screen 4th Generation wRfx Non Reactive Non Reactive    Comment: Performed at [facility], [address], [place], [place] [address]  CBC     Status: Abnormal   Collection Time: 09/03/23  1:16 PM  Result Value Ref Range   WBC 7.3 4.0 - 10.5 K/uL   RBC 4.82 4.22 - 5.81 MIL/uL   Hemoglobin 11.3 (L) 13.0 - 17.0 g/dL   HCT 61.6 (L) 60.9 - 47.9 %   MCV 79.5 (L) 80.0 - 100.0 fL  MCH 23.4 (L) 26.0 - 34.0 pg   MCHC 29.5 (L) 30.0 - 36.0 g/dL   RDW 81.2 (H) 88.4 - 84.4 %   Platelets 257 150 - 400 K/uL   nRBC 0.0 0.0 - 0.2 %    Comment: Performed at [facility], [address] [place], [place] [address]  Creatinine, serum     Status: Abnormal   Collection Time: 09/03/23  1:16 PM  Result Value Ref Range   Creatinine, Ser 1.28 (H) 0.61 - 1.24 mg/dL   GFR, Estimated >39 >39 mL/min    Comment: (NOTE) Calculated using the CKD-EPI Creatinine Equation (2021) Performed at [facility], [address], [place], [place] [address]    EKG: EKG showed a normal sinus rhythm with a rate of 76 with a QTc of 377.  There is possible left atrial enlargement no evidence of ST elevation my interpretation  Assessment and Plan: No notes have been filed under this hospital service. Service: Hospitalist  LE Pain and Bilateral Leg Swelling in the setting of Lymphedema and chronic venous stasis changes: Diurese with IV Lasix  and Pain Control. Check LE Duplex to r/o DVT. [facility]OC nurse consulted and recommending Lymphedema Clinic.  Currently getting Unna boots and given a dose of IV Lasix  4 mg x 1.  Cardiology feels that his lower extremity swelling is not in favor of a cardiac etiology.  TSH was  1.75  Volume Overload: Cardiology consulted. BNP not elevated and recent ECHO showed no evidence of CHF but he does have Cardiomegaly and did have some slight SOB prior to admission.  Repeat chest x-ray in the a.m. and continue monitor for signs and symptoms of volume overload  Essential HTN: Uncontrolled.  Has a history of noncompliance.  Resume Home Antihypertensives w/ Amlodipine  10 mg po daily, Carvedilol  25 mg po BID, and Hydralazine  50 mg po TID. C/w IV Hydralazine  10 mg q6hprn for HBP.  Also getting IV Lasix  cardiology consulted for further evaluation and recommendations. CTM BP per Protocol. Last BP reading was 154/82.  Cardiology feels that they are not been able to get his blood pressure at goal and will need outpatient follow-up.  Had appointment with advanced hypertension clinic but did not follow-up  PAF: Monitor on Telemetry; Cards Consulted for further evaluation. Not on AC due to CHA2DS2-VASc of 1  Hypokalemia: K+ is now 3.0. Replete. CTM and Replete as Necessary. Repeat CMP in the AM   Renal Insufficiency: BUN/Cr Trend: Recent Labs  Lab 09/02/23 1931 09/03/23 0957 09/03/23 1316  BUN 10 10  --   CREATININE 1.40* 1.30* 1.28*  -Getting Diuresed as Above -Avoid Nephrotoxic Medications, Contrast Dyes, Hypotension and Dehydration to Ensure Adequate Renal Perfusion and will need to Renally Adjust Meds -Continue to Monitor and Trend Renal Function carefully and repeat CMP in the AM   Cocaine Abuse: May have contributed to his decompensation as above.  Cocaine is positive and will need continued counseling  Microcyctic Anemia: Hgb/Hct Trend:  Recent Labs  Lab 09/02/23 1931 09/03/23 0957 09/03/23 1316  HGB 11.9* 10.9* 11.3*  HCT 40.5 36.4* 38.3*  MCV 79.7* 79.8* 79.5*  -Check Anemia Panel in the AM. CTM for S/Sx of Bleeding; No overt bleeding noted. Repeat CBC in the AM   Hypoalbuminemia: Patient's Albumin Trend:  Recent Labs  Lab 09/02/23 1931 09/03/23 0957  ALBUMIN  3.4* 3.0*  -Continue to Monitor and Trend and repeat CMP in the AM   Class III (Super Morbid) Obesity: Complicates overall  prognosis and care -Estimated body mass index is 62.4 kg/m as calculated from the following:   Height as of this encounter: 6' 6 (1.981 m).   Weight as of this encounter: 244.9 kg. Weight Loss and Dietary Counseling given  Suspected OSA: Will need outpatient sleep study and will place him on CPAP nightly here  Advance Care Planning:   Code Status: Full Code FULL   Consults: Cardiology   Family Communication: D/w Wife @ bedside   Severity of Illness: The appropriate patient status for this patient is INPATIENT. Inpatient status is judged to be reasonable and necessary in order to provide the required intensity of service to ensure the patient's safety. The patient's presenting symptoms, physical exam findings, and initial radiographic and laboratory data in the context of their chronic comorbidities is felt to place them at high risk for further clinical deterioration. Furthermore, it is not anticipated that the patient will be medically stable for discharge from the hospital within 2 midnights of admission.   * I certify that at the point of admission it is my clinical judgment that the patient will require inpatient hospital care spanning beyond 2 midnights from the point of admission due to high intensity of service, high risk for further deterioration and high frequency of surveillance required.*  Author: Alejandro Marker, DO 09/03/2023 10:09 PM  For on call review [email or web address].

## 2023-09-03 NOTE — Progress Notes (Signed)
   09/03/23 2348  BiPAP/CPAP/SIPAP  $ Non-Invasive Home Ventilator  Initial  $ Face Mask XL Yes  BiPAP/CPAP/SIPAP Pt Type Adult  BiPAP/CPAP/SIPAP Resmed  Mask Type Full face mask  Mask Size Extra large  EPAP  (5-20)  FiO2 (%) 21 %  Patient Home Machine No  Patient Home Mask No  Patient Home Tubing No  Auto Titrate Yes  CPAP/SIPAP surface wiped down Yes  Device Plugged into RED Power Outlet Yes

## 2023-09-03 NOTE — ED Notes (Signed)
 CCMD called to admit pt.

## 2023-09-03 NOTE — ED Notes (Signed)
 CCMD called.

## 2023-09-03 NOTE — Progress Notes (Signed)
 BLE venous duplex has been completed.   Results can be found under chart review under CV PROC. 09/03/2023 2:32 PM Mayelin Panos RVT, RDMS

## 2023-09-03 NOTE — Consult Note (Signed)
 WOC Nurse Consult Note: Reason for Consult:Chronic lymphedema, worsening in the last 7 days.  Noncompliant with medications, including diuretics and antihypertensives. Lymphedema management is beyond the scope of WOC practice in house.  Will implement Unna boots for compression while diuresing.  This will extend to just below the knee. Patient would benefit from outpatient lymphedema management.  Resources are listed below.  Unclear if patient wears compression at home.  Wound type: Inflammatory, open wound to right posterior lower leg, duration one month.  Pressure Injury POA: NA  Wound azi:Mliib red Drainage (amount, consistency, odor) Heavy serous weeping from wound and bilateral lower extremities.  Periwound:Edema.  No redness or other sign of cellulitis.  Dressing procedure/placement/frequency: Cleanse bilateral lower legs with soap and water and pat dry.  ED/Bedside RN to apply aquacel (LAWSON # [id number]) to open wound.  Ortho tech to wrap bilateral legs in [employer]. May require every other day replacement initially until edema is better managed.  Lymphedema  Resources (updated 12/2020) Each site requires a referral from your primary care MD [facility] [address] [place], [place]  [phone] (Upper extremities)  [address] [place], [place] [phone] (Lower extremities, PATIENT CAN NOT HAVE A WOUND)  [facility] [address] [place], [place] [address] [phone]  [facility] [address] Medical Office [address]  [place], [place] [phone]  [facility] [address] [place], [place] [address] [phone]  [facility]  (only treatment for lymphedema related to cancer diagnosis) [address]  [place], [place] [address] [phone]    [facility] [address] [place], [place] [address] [phone] [facility] ([facility]) [phone] [address] [place], [place] [address] [phone]  Will not follow at this time.  Please re-consult if needed.  Darice Cooley MSN, RN, FNP-BC CWON Wound, Ostomy, Continence Nurse Outpatient [facility] [phone] Pager [phone]

## 2023-09-03 NOTE — ED Provider Notes (Signed)
 [facility] Provider Note   CSN: [id number] Arrival date & time: 09/02/23  1658     Patient presents with: Leg Swelling   Jared Mccoy is a 33 y.o. adult.   The history is provided by the patient.   33 year old male with history of obesity, peripheral vascular disease, hypertension, history of paroxysmal A-fib not currently on anticoagulation, presenting to the ED for LE edema.  Patient reports longstanding hx of leg swelling x7 days but acutely worse about 1 week ago.  States over the past 24 hours legs have started weeping clear fluid.  He states they feel extremely heavy.  He does report some SOB but denies cough/fever.  He has not been compliant with his HTN medications nor his diuretics.  Was taken off Lasix  due to persistent hypokalemia, it appears he was supposed to be switched to spironolactone .  Significant other at bedside normally picks up his medication from [facility], did not have spironolactone  last time she picked up so not sure entirely what happened. Prior to Admission medications   Medication Sig Start Date End Date Taking? Authorizing Provider  amLODipine  (NORVASC ) 10 MG tablet Take 1 tablet (10 mg total) by mouth daily. 07/29/23   Celestia Rosaline SQUIBB, NP  carvedilol  (COREG ) 25 MG tablet Take 1 tablet (25 mg total) by mouth 2 (two) times daily with a meal. 07/29/23   Celestia Rosaline SQUIBB, NP  hydrALAZINE  (APRESOLINE ) 50 MG tablet Take 1 tablet (50 mg total) by mouth 3 (three) times daily. 07/29/23   Celestia Rosaline SQUIBB, NP  spironolactone  (ALDACTONE ) 25 MG tablet Take 1 tablet (25 mg total) by mouth 2 (two) times daily. 07/29/23   Celestia Rosaline SQUIBB, NP    Allergies: Crab (diagnostic)    Review of Systems  Cardiovascular:  Positive for leg swelling.  All other systems reviewed and are negative.   Updated Vital Signs BP (!) 224/121 (BP Location: Right Arm)   Pulse 65   Temp 98.3 F (36.8 C)   Resp 18   Ht 6' 6 (1.981  m)   Wt (!) 244.9 kg   SpO2 96%   BMI 62.40 kg/m   Physical Exam Vitals and nursing note reviewed.  Constitutional:      Appearance: He is well-developed. He is morbidly obese.  HENT:     Head: Normocephalic and atraumatic.  Eyes:     Conjunctiva/sclera: Conjunctivae normal.     Pupils: Pupils are equal, round, and reactive to light.  Cardiovascular:     Rate and Rhythm: Normal rate and regular rhythm.     Heart sounds: Normal heart sounds.  Pulmonary:     Effort: Pulmonary effort is normal.     Breath sounds: Rales present.     Comments: Rales at bases Abdominal:     General: Bowel sounds are normal.     Palpations: Abdomen is soft.  Musculoskeletal:        General: Normal range of motion.     Cervical back: Normal range of motion.     Comments: BLE are extremely edematous, there are chronic venous stasis changes of the lower legs and ankles, weeping of clear fluid present  Skin:    General: Skin is warm and dry.  Neurological:     Mental Status: He is alert and oriented to person, place, and time.     (all labs ordered are listed, but only abnormal results are displayed) Labs Reviewed  CBC WITH DIFFERENTIAL/PLATELET - Abnormal; Notable  for the following components:      Result Value   Hemoglobin 11.9 (*)    MCV 79.7 (*)    MCH 23.4 (*)    MCHC 29.4 (*)    RDW 18.8 (*)    All other components within normal limits  COMPREHENSIVE METABOLIC PANEL WITH GFR - Abnormal; Notable for the following components:   Potassium 2.7 (*)    Creatinine, Ser 1.40 (*)    Calcium 8.7 (*)    Albumin 3.4 (*)    All other components within normal limits  BRAIN NATRIURETIC PEPTIDE  MAGNESIUM   TROPONIN I (HIGH SENSITIVITY)  TROPONIN I (HIGH SENSITIVITY)    EKG: EKG Interpretation Date/Time:  Wednesday September 03 2023 00:48:26 EDT Ventricular Rate:  76 PR Interval:  153 QRS Duration:  105 QT Interval:  335 QTC Calculation: 377 R Axis:   76  Text Interpretation: Sinus rhythm  Probable left atrial enlargement Nonspecific T abnormalities, lateral leads Confirmed by Griselda Norris [phone]) on 09/03/2023 1:25:56 AM  Radiology: [facility] Chest Port 1 View Result Date: 09/03/2023 EXAM: 1 VIEW XRAY OF THE CHEST 09/03/2023 12:44:00 AM COMPARISON: 07/12/2023 CLINICAL HISTORY: SOB, CHF. Encounter for shortness of breath and congestive heart failure. FINDINGS: LUNGS AND PLEURA: No focal pulmonary opacity. No frank interstitial edema. No pleural effusion. No pneumothorax. HEART AND MEDIASTINUM: Stable cardiomegaly. No acute abnormality of the cardiac and mediastinal silhouettes. BONES AND SOFT TISSUES: No acute osseous abnormality. IMPRESSION: 1. Stable cardiomegaly. 2. No frank interstitial edema. Electronically signed by: Pinkie Pebbles MD 09/03/2023 12:56 AM EDT RP Workstation: [id number]     Procedures   Medications Ordered in the ED  furosemide  (LASIX ) injection 40 mg (40 mg Intravenous Given 09/03/23 0103)  potassium chloride  SA (KLOR-CON  M) CR tablet 40 mEq (40 mEq Oral Given 09/03/23 0102)  potassium chloride  10 mEq in 100 mL IVPB (0 mEq Intravenous Stopped 09/03/23 0314)  hydrALAZINE  (APRESOLINE ) tablet 50 mg (50 mg Oral Given 09/03/23 0102)  amLODipine  (NORVASC ) tablet 10 mg (10 mg Oral Given 09/03/23 0102)  carvedilol  (COREG ) tablet 25 mg (25 mg Oral Given 09/03/23 0102)                                    Medical Decision Making Amount and/or Complexity of Data Reviewed Labs: ordered. Radiology: ordered and independent interpretation performed. ECG/medicine tests: ordered and independent interpretation performed.  Risk Prescription drug management. Decision regarding hospitalization.   33 year old male presenting to the ED with significant lower extremity edema.  Has begun leaking fluid over the past 48 hours, acutely worse today so significant other brought him in.  Patient is morbidly obese.  He does have extensive edema of the lower extremities, venous stasis  changes of the lower legs and ankles which appear chronic.  I do not appreciate any evidence of acute cellulitis.  He is consistently weeping clear fluid despite dressings in place.  He is also hypertensive at 224/121 during exam.  He has been non-complaint with HTN meds for unclear reasons.  Does have some rales on exam.    EKG is abnormal for similar to prior.  Labs without leukocytosis.  Stable anemia.  Potassium is low at 2.7, magnesium  is normal.  Will give IV and p.o. replacement.  Chest x-ray with cardiomegaly but no frank pulmonary edema.  Give dose of IV lasix .  Home BP meds also given and pressures are downtrending.  Troponin is negative.  I suspect he needs admission for aggressive medication management and further address his LE edema.    Discussed with Dr. Keturah-- will be a carry over admission for AM team.  Final diagnoses:  Peripheral edema  Other hypervolemia  Non compliance w medication regimen    ED Discharge Orders     None          Jarold Olam HERO, PA-C 09/03/23 ARTEMUS Griselda Norris, MD 09/03/23 [phone]

## 2023-09-03 NOTE — ED Notes (Signed)
 Charge RN notified patient coming to floor

## 2023-09-03 NOTE — Progress Notes (Signed)
 Ortho tech was called and no response.

## 2023-09-04 ENCOUNTER — Telehealth (INDEPENDENT_AMBULATORY_CARE_PROVIDER_SITE_OTHER): Payer: Self-pay | Admitting: Primary Care

## 2023-09-04 ENCOUNTER — Other Ambulatory Visit (HOSPITAL_COMMUNITY): Payer: Self-pay

## 2023-09-04 DIAGNOSIS — R2243 Localized swelling, mass and lump, lower limb, bilateral: Secondary | ICD-10-CM | POA: Diagnosis not present

## 2023-09-04 DIAGNOSIS — I1A0 Resistant hypertension: Secondary | ICD-10-CM | POA: Diagnosis not present

## 2023-09-04 DIAGNOSIS — R600 Localized edema: Secondary | ICD-10-CM | POA: Diagnosis not present

## 2023-09-04 DIAGNOSIS — I890 Lymphedema, not elsewhere classified: Secondary | ICD-10-CM | POA: Diagnosis not present

## 2023-09-04 LAB — CBC WITH DIFFERENTIAL/PLATELET
Abs Immature Granulocytes: 0.01 K/uL (ref 0.00–0.07)
Basophils Absolute: 0 K/uL (ref 0.0–0.1)
Basophils Relative: 0 %
Eosinophils Absolute: 0.2 K/uL (ref 0.0–0.5)
Eosinophils Relative: 3 %
HCT: 36.5 % — ABNORMAL LOW (ref 39.0–52.0)
Hemoglobin: 11 g/dL — ABNORMAL LOW (ref 13.0–17.0)
Immature Granulocytes: 0 %
Lymphocytes Relative: 32 %
Lymphs Abs: 2.1 K/uL (ref 0.7–4.0)
MCH: 23.5 pg — ABNORMAL LOW (ref 26.0–34.0)
MCHC: 30.1 g/dL (ref 30.0–36.0)
MCV: 78 fL — ABNORMAL LOW (ref 80.0–100.0)
Monocytes Absolute: 0.6 K/uL (ref 0.1–1.0)
Monocytes Relative: 9 %
Neutro Abs: 3.7 K/uL (ref 1.7–7.7)
Neutrophils Relative %: 56 %
Platelets: 241 K/uL (ref 150–400)
RBC: 4.68 MIL/uL (ref 4.22–5.81)
RDW: 18.7 % — ABNORMAL HIGH (ref 11.5–15.5)
WBC: 6.6 K/uL (ref 4.0–10.5)
nRBC: 0 % (ref 0.0–0.2)

## 2023-09-04 LAB — COMPREHENSIVE METABOLIC PANEL WITH GFR
ALT: 14 U/L (ref 0–44)
AST: 14 U/L — ABNORMAL LOW (ref 15–41)
Albumin: 2.9 g/dL — ABNORMAL LOW (ref 3.5–5.0)
Alkaline Phosphatase: 64 U/L (ref 38–126)
Anion gap: 9 (ref 5–15)
BUN: 14 mg/dL (ref 6–20)
CO2: 32 mmol/L (ref 22–32)
Calcium: 8.5 mg/dL — ABNORMAL LOW (ref 8.9–10.3)
Chloride: 98 mmol/L (ref 98–111)
Creatinine, Ser: 1.48 mg/dL — ABNORMAL HIGH (ref 0.61–1.24)
GFR, Estimated: >60 mL/min (ref >60)
Glucose, Bld: 112 mg/dL — ABNORMAL HIGH (ref 70–99)
Potassium: 3.2 mmol/L — ABNORMAL LOW (ref 3.5–5.1)
Sodium: 139 mmol/L (ref 135–145)
Total Bilirubin: 0.4 mg/dL (ref 0.0–1.2)
Total Protein: 6.8 g/dL (ref 6.5–8.1)

## 2023-09-04 LAB — IRON AND TIBC
Iron: 24 ug/dL — ABNORMAL LOW (ref 45–182)
Saturation Ratios: 6 % — ABNORMAL LOW (ref 17.9–39.5)
TIBC: 385 ug/dL (ref 250–450)
UIBC: 361 ug/dL

## 2023-09-04 LAB — RETICULOCYTES
Immature Retic Fract: 24.2 % — ABNORMAL HIGH (ref 2.3–15.9)
RBC.: 4.64 MIL/uL (ref 4.22–5.81)
Retic Count, Absolute: 79.8 K/uL (ref 19.0–186.0)
Retic Ct Pct: 1.7 % (ref 0.4–3.1)

## 2023-09-04 LAB — MAGNESIUM: Magnesium: 1.8 mg/dL (ref 1.7–2.4)

## 2023-09-04 LAB — FOLATE: Folate: 7.6 ng/mL (ref >5.9)

## 2023-09-04 LAB — FERRITIN: Ferritin: 17 ng/mL — ABNORMAL LOW (ref 24–336)

## 2023-09-04 LAB — VITAMIN B12: Vitamin B-12: 365 pg/mL (ref 180–914)

## 2023-09-04 LAB — PHOSPHORUS: Phosphorus: 3.6 mg/dL (ref 2.5–4.6)

## 2023-09-04 MED ORDER — SPIRONOLACTONE 25 MG PO TABS
25.0000 mg | ORAL_TABLET | Freq: Two times a day (BID) | ORAL | 0 refills | Status: DC
  Filled 2023-09-04: qty 60, 30d supply, fill #0

## 2023-09-04 MED ORDER — POTASSIUM CHLORIDE 10 MEQ/100ML IV SOLN
10.0000 meq | INTRAVENOUS | Status: AC
  Administered 2023-09-04 (×4): 10 meq via INTRAVENOUS
  Filled 2023-09-04 (×4): qty 100

## 2023-09-04 MED ORDER — HYDRALAZINE HCL 50 MG PO TABS
100.0000 mg | ORAL_TABLET | Freq: Three times a day (TID) | ORAL | Status: DC
  Administered 2023-09-04: 100 mg via ORAL
  Filled 2023-09-04: qty 2

## 2023-09-04 MED ORDER — ACETAMINOPHEN 325 MG PO TABS
650.0000 mg | ORAL_TABLET | Freq: Four times a day (QID) | ORAL | 0 refills | Status: DC | PRN
  Filled 2023-09-04: qty 20, 3d supply, fill #0

## 2023-09-04 MED ORDER — MAGNESIUM SULFATE 2 GM/50ML IV SOLN
2.0000 g | Freq: Once | INTRAVENOUS | Status: AC
  Administered 2023-09-04: 2 g via INTRAVENOUS
  Filled 2023-09-04: qty 50

## 2023-09-04 MED ORDER — IRBESARTAN 150 MG PO TABS
150.0000 mg | ORAL_TABLET | Freq: Every day | ORAL | 0 refills | Status: DC
  Filled 2023-09-04: qty 30, 30d supply, fill #0

## 2023-09-04 MED ORDER — IRBESARTAN 150 MG PO TABS
150.0000 mg | ORAL_TABLET | Freq: Every day | ORAL | Status: DC
Start: 2023-09-04 — End: 2023-09-04
  Administered 2023-09-04: 150 mg via ORAL
  Filled 2023-09-04: qty 1

## 2023-09-04 MED ORDER — ONDANSETRON HCL 4 MG PO TABS
4.0000 mg | ORAL_TABLET | Freq: Four times a day (QID) | ORAL | 0 refills | Status: DC | PRN
  Filled 2023-09-04: qty 20, 5d supply, fill #0

## 2023-09-04 MED ORDER — AMLODIPINE BESYLATE 10 MG PO TABS
10.0000 mg | ORAL_TABLET | Freq: Every day | ORAL | 0 refills | Status: DC
  Filled 2023-09-04: qty 30, 30d supply, fill #0

## 2023-09-04 MED ORDER — CARVEDILOL 25 MG PO TABS
25.0000 mg | ORAL_TABLET | Freq: Two times a day (BID) | ORAL | 0 refills | Status: DC
  Filled 2023-09-04: qty 60, 30d supply, fill #0

## 2023-09-04 MED ORDER — HYDRALAZINE HCL 100 MG PO TABS
100.0000 mg | ORAL_TABLET | Freq: Three times a day (TID) | ORAL | 0 refills | Status: DC
  Filled 2023-09-04: qty 90, 30d supply, fill #0

## 2023-09-04 NOTE — Progress Notes (Signed)
 Reviewed AVS, patient expressed understanding of medications, MD follow up reviewed.   Removed IV, Site clean, dry and intact.  Patient states all belongings brought to the hospital at time of admission are accounted for and packed to take home.  Picked up medications from [facility] pharmacy. Pt transported to entrance A where family member was waiting in vehicle to transport home.

## 2023-09-04 NOTE — Plan of Care (Signed)
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
## 2023-09-04 NOTE — Care Management CC44 (Signed)
 Condition Code 44 Documentation Completed  Patient Details  Name: Jared Mccoy MRN: [id number] Date of Birth: 11/06/1990   Condition Code 44 given:    Patient signature on Condition Code 44 notice:    Documentation of 2 MD's agreement:    Code 44 added to claim:       Cassady Stanczak G Henrik Orihuela, RN 09/04/2023, 11:50 AM

## 2023-09-04 NOTE — Progress Notes (Signed)
 Mobility Specialist Progress Note:    09/04/23 0903  Mobility  Activity Ambulated with assistance in hallway;Ambulated with assistance in room  Level of Assistance Modified independent, requires aide device or extra time  Assistive Device Other (Comment) (hand rails)  Distance Ambulated (ft) 100 ft  Activity Response Tolerated well  Mobility Referral Yes  Mobility visit 1 Mobility  Mobility Specialist Start Time (ACUTE ONLY) [id number]  Mobility Specialist Stop Time (ACUTE ONLY) 0926  Mobility Specialist Time Calculation (min) (ACUTE ONLY) 23 min   Pt received in bed, agreeable to mobility session. Ambulated on RA SpO2 91-94%. ModI required for stability, using hand rails in hallway. Tolerated well, asx throughout. Returned pt to room, sitting by sink for bath. All needs met.   Maleigha Colvard Mobility Specialist Please contact via Special educational needs teacher or  Rehab office at [phone]

## 2023-09-04 NOTE — Telephone Encounter (Signed)
 Will forward to provider

## 2023-09-04 NOTE — Care Management Obs Status (Signed)
 MEDICARE OBSERVATION STATUS NOTIFICATION   Patient Details  Name: Jared Mccoy MRN: [id number] Date of Birth: [birth year]   Medicare Observation Status Notification Given:       Roxie KANDICE Stain, RN 09/04/2023, 11:51 AM
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
## 2023-09-04 NOTE — Progress Notes (Signed)
 Heart Failure Navigator Progress Note  Assessed for Heart & Vascular TOC clinic readiness.  Patient does not meet criteria due to EF 65-70% . chronic lymphedema, cards does not think edema is related to cardiac etiology. No HF TOC.   Navigator will sign off at this time.   Stephane Haddock, BSN, Scientist, clinical (histocompatibility and immunogenetics) Only
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
## 2023-09-06 NOTE — Discharge Summary (Signed)
 Physician Discharge Summary   Patient: Jared Mccoy MRN: [id number] DOB: 1990-08-04  Admit date:     09/02/2023  Discharge date: 09/04/2023  Discharge Physician: Alejandro Marker, DO   PCP: Celestia Rosaline SQUIBB, NP   Recommendations at discharge:   Follow-up with PCP within 1 to 2 weeks repeat CBC, CMP, mag, Phos within 1 week Follow-up with cardiology in outpatient setting within 1 to 2 weeks Follow-up with the lymphedema clinic in the outpatient setting.  Discharge Diagnoses: Principal Problem:   Leg edema Active Problems:   Resistant hypertension   Morbid obesity (HCC)   Peripheral edema  Resolved Problems:   * No resolved hospital problems. *  Hospital Course: Jared Mccoy is a super morbidly obese 33 y.o. adult with medical history significant for but not limited to peripheral vascular disease, essential hypertension, paroxysmal atrial fibrillation not on anticoagulation as well as other comorbidities who presented to the hospital with worsening pain in his lower extremities as well as worsening swelling with weeping.  He states that this has been progressively worsening for last week or so but over the last few days it acutely worsened in the setting of the last day significantly started weeping.  States that legs were so swollen they started weeping clear fluid and states that they feel extremely heavy.  He did report some mild shortness of breath but denied a cough or fever.  Currently has not been compliant with his diuretics and recently was taken off of Lasix  due to his persistent hypokalemia and was supposed to be switched to spironolactone .  Denies any chest discomfort, burning or discomfort in his urine, lightheadedness or dizziness.  Given his symptoms this prompted him to be evaluated in the ED and [facility] is now admitting this patient for worsening right extremity swelling and cardiology has been consulted and given his volume overload for further evaluation.   **Interim  History: Cardiology felt that his leg swelling was not related to cardiac in origin and they recommended Unna boots and wound care evaluation.  Patient was given some diuresis and his leg swelling and pain improved.  He is improved and blood pressure improved as well and he was deemed medically stable for discharge he will need to follow-up with PCP and cardiology outpatient setting as well as the lymphedema clinic.  Assessment and Plan:  LE Pain and Bilateral Leg Swelling in the setting of Lymphedema and chronic venous stasis changes: Diurese with IV Lasix  and Pain Control. Check LE Duplex to r/o DVT. WOC nurse consulted and recommending Lymphedema Clinic.  Currently getting Unna boots and given a dose of IV Lasix  4 mg x 1.  Cardiology feels that his lower extremity swelling is not in favor of a cardiac etiology.  TSH was 1.75.  Cardiology has cleared the patient for discharge given that his legs are improved and cardiology feel he has no significant valvular disease.  He will need outpatient wound care and has a pending appointment with the vein center for long-term management   Volume Overload: Improved cardiology consulted. BNP not elevated and recent ECHO showed no evidence of CHF but he does have Cardiomegaly and did have some slight SOB prior to admission.  Given diuresis and his lower extremity swelling is in the setting of venous stasis and lymphedema.  Lasix  is now stopped.   Essential HTN: Uncontrolled.  Has a history of noncompliance.  Resume Home Antihypertensives w/ Amlodipine  10 mg po daily, Carvedilol  25 mg po BID, and Hydralazine  50 mg  po TID. C/w IV Hydralazine  10 mg q6hprn for HBP.  Also getting IV Lasix  cardiology consulted for further evaluation and recommendations. CTM BP per Protocol. Last BP reading was 154/82.  Cardiology feels that they are not been able to get his blood pressure at goal and will need outpatient follow-up.  Had appointment with advanced hypertension clinic but did  not follow-up has been referred again.  Cardiology made some further adjustments.  Patient will be discharged on amlodipine  10 mg p.o. daily, carvedilol  25 1 p.o. twice daily, hydralazine  100 mg 3 times daily and spironolactone  25 mg twice daily and not use furosemide  given the hypokalemia   PAF: Monitor on Telemetry; Cards Consulted for further evaluation. Not on AC due to CHA2DS2-VASc of 1   Hypokalemia: K+ is now 3.2. Replete prior to D/C. CTM and Replete as Necessary. Repeat CMP in the AM    Renal Insufficiency: BUN/Cr Trend: Recent Labs  Lab 09/02/23 1931 09/03/23 0957 09/03/23 1316 09/04/23 0342  BUN 10 10  --  14  CREATININE 1.40* 1.30*   < > 1.48*   < > = values in this interval not displayed.  -Getting Diuresed as Above -Avoid Nephrotoxic Medications, Contrast Dyes, Hypotension and Dehydration to Ensure Adequate Renal Perfusion and will need to Renally Adjust Meds -Continue to Monitor and Trend Renal Function carefully and repeat CMP in the AM    Cocaine Abuse: May have contributed to his decompensation as above.  Cocaine is positive and will need continued counseling   Microcyctic Anemia: Hgb/Hct Trend:  Recent Labs  Lab 09/02/23 1931 09/03/23 0957 09/03/23 1316 09/04/23 0342  HGB 11.9* 10.9* 11.3* 11.0*  HCT 40.5 36.4* 38.3* 36.5*  MCV 79.7* 79.8* 79.5* 78.0*  -Checked Anemia Panel showed an iron level of 24, UIBC 361, TIBC 385, saturation ratios of 6%, ferritin 117, folate level 7.6 vitamin B12 365. CTM for S/Sx of Bleeding; No overt bleeding noted. Repeat CBC within 1 week   Hypoalbuminemia: Patient's Albumin Lvl Trending down and went from 3.4 -> 3.0 -> 2.9. CTM and Trend and repeat CMP within 1 week   Class III (Super Morbid) Obesity: Complicates overall prognosis and care -Estimated body mass index is 62.4 kg/m as calculated from the following:   Height as of this encounter: 6' 6 (1.981 m).   Weight as of this encounter: 244.9 kg. Weight Loss and Dietary  Counseling given   Suspected OSA: Will need outpatient sleep study and will place him on CPAP nightly here   Consultants: Cardiology Procedures performed: As delineated as above Disposition: Home Diet recommendation:  Discharge Diet Orders (From admission, onward)     Start     Ordered   09/04/23 0000  Diet - low sodium heart healthy        09/04/23 1601           Cardiac diet DISCHARGE MEDICATION: Allergies as of 09/04/2023       Reactions   Crab (diagnostic) Diarrhea, Nausea And Vomiting   Reaction only to crab legs         Medication List     TAKE these medications    acetaminophen  325 MG tablet Commonly known as: TYLENOL  Take 2 tablets (650 mg total) by mouth every 6 (six) hours as needed for mild pain (pain score 1-3) or fever (or Fever >/= 101).   amLODipine  10 MG tablet Commonly known as: NORVASC  Take 1 tablet (10 mg total) by mouth daily.   carvedilol  25 MG tablet Commonly known  as: COREG  Take 1 tablet (25 mg total) by mouth 2 (two) times daily with a meal.   hydrALAZINE  100 MG tablet Commonly known as: APRESOLINE  Take 1 tablet (100 mg total) by mouth every 8 (eight) hours. What changed:  medication strength how much to take when to take this   irbesartan  150 MG tablet Commonly known as: AVAPRO  Take 1 tablet (150 mg total) by mouth daily.   ondansetron  4 MG tablet Commonly known as: ZOFRAN  Take 1 tablet (4 mg total) by mouth every 6 (six) hours as needed for nausea.   spironolactone  25 MG tablet Commonly known as: ALDACTONE  Take 1 tablet (25 mg total) by mouth 2 (two) times daily.               Discharge Care Instructions  (From admission, onward)           Start     Ordered   09/04/23 0000  Discharge wound care:       Comments: Cleanse bilateral lower legs with soap and water and pat dry.  ED/Bedside RN to apply aquacel (LAWSON # [id number]) to open wound.  Ortho tech to wrap bilateral legs in [employer]. May require every other  day replacement initially until edema is better managed.   09/04/23 1601            Follow-up Information     Vannie Reche RAMAN, NP. Go on 09/11/2023.   Specialty: Cardiology Why: Please go to advanced hypertension clinic visit with Reche Vannie, NP on 09/11/23 at 1 PM. Contact information: [address] [place] [address] [phone]                Discharge Exam: [name]   09/02/23 1924  Weight: (!) 244.9 kg   Vitals:   09/04/23 1147 09/04/23 1601  BP: 137/65 137/65  Pulse: 79   Resp: 18   Temp: 97.6 F (36.4 C)   SpO2: 93%    Examination: Physical Exam:  Constitutional: WN/WD super morbidly obese African-American male in no acute distress Respiratory: Diminished to auscultation bilaterally, no wheezing, rales, rhonchi or crackles. Normal respiratory effort and patient is not tachypenic. No accessory muscle use.  Unlabored breathing Cardiovascular: RRR, no murmurs / rubs / gallops. S1 and S2 auscultated.  Legs are swollen and wrapped in Unna boots Abdomen: Soft, non-tender, secondary body habitus. Bowel sounds positive.  GU: Deferred. Musculoskeletal: No clubbing / cyanosis of digits/nails. No joint deformity upper and lower extremities.  Skin: Has a wound on his right posterior lower leg but legs are wrapped in Unna boots Neurologic: CN 2-12 grossly intact with no focal deficits. Romberg sign cerebellar reflexes not assessed.  Psychiatric: Normal judgment and insight. Alert and oriented x 3. Normal mood and appropriate affect.   Condition at discharge: stable  The results of significant diagnostics from this hospitalization (including imaging, microbiology, ancillary and laboratory) are listed below for reference.   Imaging Studies: VAS US  LOWER EXTREMITY VENOUS (DVT) Result Date: 09/04/2023  Lower Venous DVT Study Patient Name:  LINKEN MCGLOTHEN  Date of Exam:   09/03/2023 Medical Rec #: [id number]        Accession #:    [id number] Date of  Birth: [birth year]         Patient Gender: M Patient Age:   33 years Exam Location:  [facility] Procedure:      VAS US  LOWER EXTREMITY VENOUS (DVT) Referring Phys: ALEJANDRO MARKER --------------------------------------------------------------------------------  Indications: Edema.  Risk Factors: Lymphedema,  medical noncompliance, obesity (BMI 62.40). Limitations: Body habitus and poor ultrasound/tissue interface. Comparison Study: RLEV on 10/09/2020 & LLEV on 04/13/3033 were negative for DVT Performing Technologist: Ezzie Potters RVT, RDMS  Examination Guidelines: A complete evaluation includes B-mode imaging, spectral Doppler, color Doppler, and power Doppler as needed of all accessible portions of each vessel. Bilateral testing is considered an integral part of a complete examination. Limited examinations for reoccurring indications may be performed as noted. The reflux portion of the exam is performed with the patient in reverse Trendelenburg.  +---------+---------------+---------+-----------+----------+-------------------+ RIGHT    CompressibilityPhasicitySpontaneityPropertiesThrombus Aging      +---------+---------------+---------+-----------+----------+-------------------+ CFV      Full           Yes      Yes                                      +---------+---------------+---------+-----------+----------+-------------------+ SFJ      Full                                                             +---------+---------------+---------+-----------+----------+-------------------+ FV Prox  Full           Yes      Yes                                      +---------+---------------+---------+-----------+----------+-------------------+ FV Mid   Full           Yes      Yes                                      +---------+---------------+---------+-----------+----------+-------------------+ FV DistalFull           Yes      Yes                                       +---------+---------------+---------+-----------+----------+-------------------+ PFV      Full                                                             +---------+---------------+---------+-----------+----------+-------------------+ POP      Full           Yes      Yes                                      +---------+---------------+---------+-----------+----------+-------------------+ PTV                                                   unable to visualize +---------+---------------+---------+-----------+----------+-------------------+ PERO  unable to visualize +---------+---------------+---------+-----------+----------+-------------------+   +--------+---------------+---------+-----------+----------+--------------------+ LEFT    CompressibilityPhasicitySpontaneityPropertiesThrombus Aging       +--------+---------------+---------+-----------+----------+--------------------+ CFV     Full           Yes      Yes                                       +--------+---------------+---------+-----------+----------+--------------------+ SFJ     Full                                                              +--------+---------------+---------+-----------+----------+--------------------+ FV Prox Full           Yes      Yes                                       +--------+---------------+---------+-----------+----------+--------------------+ FV Mid                 Yes      Yes                  patent by                                                                 color/doppler        +--------+---------------+---------+-----------+----------+--------------------+ FV                     Yes      Yes                  patent by            Distal                                               color/doppler        +--------+---------------+---------+-----------+----------+--------------------+  PFV                    Yes      Yes                  patent by                                                                 color/doppler        +--------+---------------+---------+-----------+----------+--------------------+ POP     Full           Yes      Yes                                       +--------+---------------+---------+-----------+----------+--------------------+  PTV                                                  unable to visualize  +--------+---------------+---------+-----------+----------+--------------------+ PERO                                                 unable to visualize  +--------+---------------+---------+-----------+----------+--------------------+     Summary: BILATERAL: -No evidence of popliteal cyst, bilaterally. -Bilateral subcutaneous edema. RIGHT: - There is no evidence of deep vein thrombosis in the lower extremity. However, portions of this examination were limited- see technologist comments above.  LEFT: - There is no evidence of deep vein thrombosis in the lower extremity. However, portions of this examination were limited- see technologist comments above.  - Ultrasound characteristics of enlarged lymph nodes noted in the groin.  *See table(s) above for measurements and observations. Electronically signed by Lonni Gaskins MD on 09/04/2023 at 8:26:55 AM.    Final    DG Chest Port 1 View Result Date: 09/03/2023 EXAM: 1 VIEW XRAY OF THE CHEST 09/03/2023 12:44:00 AM COMPARISON: 07/12/2023 CLINICAL HISTORY: SOB, CHF. Encounter for shortness of breath and congestive heart failure. FINDINGS: LUNGS AND PLEURA: No focal pulmonary opacity. No frank interstitial edema. No pleural effusion. No pneumothorax. HEART AND MEDIASTINUM: Stable cardiomegaly. No acute abnormality of the cardiac and mediastinal silhouettes. BONES AND SOFT TISSUES: No acute osseous abnormality. IMPRESSION: 1. Stable cardiomegaly. 2. No frank interstitial edema.  Electronically signed by: Pinkie Pebbles MD 09/03/2023 12:56 AM EDT RP Workstation: [id number]   Microbiology: Results for orders placed or performed during the hospital encounter of 04/06/23  Resp panel by RT-PCR (RSV, Flu A&B, Covid) Anterior Nasal Swab     Status: Abnormal   Collection Time: 04/06/23  2:25 PM   Specimen: Anterior Nasal Swab  Result Value Ref Range Status   SARS Coronavirus 2 by RT PCR NEGATIVE NEGATIVE Final    Comment: (NOTE) SARS-CoV-2 target nucleic acids are NOT DETECTED.  The SARS-CoV-2 RNA is generally detectable in upper respiratory specimens during the acute phase of infection. The lowest concentration of SARS-CoV-2 viral copies this assay can detect is 138 copies/mL. A negative result does not preclude SARS-Cov-2 infection and should not be used as the sole basis for treatment or other patient management decisions. A negative result may occur with  improper specimen collection/handling, submission of specimen other than nasopharyngeal swab, presence of viral mutation(s) within the areas targeted by this assay, and inadequate number of viral copies(<138 copies/mL). A negative result must be combined with clinical observations, patient history, and epidemiological information. The expected result is Negative.  Fact Sheet for Patients:  BloggerCourse.com  Fact Sheet for Healthcare Providers:  SeriousBroker.it  This test is no t yet approved or cleared by the [place]  FDA and  has been authorized for detection and/or diagnosis of SARS-CoV-2 by FDA under an Emergency Use Authorization (EUA). This EUA will remain  in effect (meaning this test can be used) for the duration of the COVID-19 declaration under Section 564(b)(1) of the Act, 21 U.S.C.section 360bbb-3(b)(1), unless the authorization is terminated  or revoked sooner.       Influenza A by PCR POSITIVE (A) NEGATIVE Final   Influenza B by PCR  NEGATIVE NEGATIVE Final    Comment: (NOTE) The Xpert Xpress SARS-CoV-2/FLU/RSV plus assay is intended as an aid in the diagnosis of influenza from Nasopharyngeal swab specimens and should not be used as a sole basis for treatment. Nasal washings and aspirates are unacceptable for Xpert Xpress SARS-CoV-2/FLU/RSV testing.  Fact Sheet for Patients: BloggerCourse.com  Fact Sheet for Healthcare Providers: SeriousBroker.it  This test is not yet approved or cleared by the [place]  FDA and has been authorized for detection and/or diagnosis of SARS-CoV-2 by FDA under an Emergency Use Authorization (EUA). This EUA will remain in effect (meaning this test can be used) for the duration of the COVID-19 declaration under Section 564(b)(1) of the Act, 21 U.S.C. section 360bbb-3(b)(1), unless the authorization is terminated or revoked.     Resp Syncytial Virus by PCR NEGATIVE NEGATIVE Final    Comment: (NOTE) Fact Sheet for Patients: BloggerCourse.com  Fact Sheet for Healthcare Providers: SeriousBroker.it  This test is not yet approved or cleared by the [place]  FDA and has been authorized for detection and/or diagnosis of SARS-CoV-2 by FDA under an Emergency Use Authorization (EUA). This EUA will remain in effect (meaning this test can be used) for the duration of the COVID-19 declaration under Section 564(b)(1) of the Act, 21 U.S.C. section 360bbb-3(b)(1), unless the authorization is terminated or revoked.  Performed at [facility], [address], [place], [place] [address]   Culture, blood (Routine X 2) w Reflex to ID Panel     Status: Abnormal   Collection Time: 04/06/23  6:05 PM   Specimen: BLOOD  Result Value Ref Range Status   Specimen Description   Final    BLOOD SITE NOT SPECIFIED Performed at [facility], [address]  [place], [place] [address]    Special Requests   Final    BOTTLES DRAWN AEROBIC AND ANAEROBIC Blood Culture adequate volume Performed at [facility], [address], [place], [place] [address]    Culture  Setup Time   Final    GRAM POSITIVE COCCI IN CHAINS IN BOTH AEROBIC AND ANAEROBIC BOTTLES CRITICAL RESULT CALLED TO, READ BACK BY AND VERIFIED WITH: PHARMD ANH PHAM ON 04/07/23 @ 1347 BY DRT    Culture (A)  Final    STREPTOCOCCUS GROUP G SUSCEPTIBILITIES PERFORMED ON PREVIOUS CULTURE WITHIN THE LAST 5 DAYS. Performed at [facility], [address], [place], [place] [address]    Report Status 04/09/2023 FINAL  Final  Culture, blood (Routine X 2) w Reflex to ID Panel     Status: Abnormal   Collection Time: 04/06/23  6:05 PM   Specimen: BLOOD  Result Value Ref Range Status   Specimen Description   Final    BLOOD SITE NOT SPECIFIED Performed at [facility], [address] [place], [place] [address]    Special Requests   Final    BOTTLES DRAWN AEROBIC AND ANAEROBIC Blood Culture adequate volume Performed at [facility], [address], [place], [place] [address]    Culture  Setup Time   Final    GRAM POSITIVE COCCI IN CHAINS IN BOTH AEROBIC AND ANAEROBIC BOTTLES CRITICAL RESULT CALLED TO, READ BACK BY AND VERIFIED WITH: PHARMD ANH PHAM ON 04/07/23 @ 1347 BY DRT Performed at [facility], [address], [place], [place] [address]    Culture STREPTOCOCCUS GROUP G (A)  Final   Report Status 04/09/2023 FINAL  Final   Organism ID,  Bacteria STREPTOCOCCUS GROUP G  Final      Susceptibility   Streptococcus group g - MIC*    CLINDAMYCIN  RESISTANT Resistant     AMPICILLIN <=0.25 SENSITIVE Sensitive     ERYTHROMYCIN >=8 RESISTANT Resistant     VANCOMYCIN  0.5 SENSITIVE Sensitive     CEFTRIAXONE  <=0.12 SENSITIVE Sensitive     LEVOFLOXACIN 0.5 SENSITIVE Sensitive     PENICILLIN  <=0.06 SENSITIVE Sensitive     *  STREPTOCOCCUS GROUP G  Blood Culture ID Panel (Reflexed)     Status: Abnormal   Collection Time: 04/06/23  6:05 PM  Result Value Ref Range Status   Enterococcus faecalis NOT DETECTED NOT DETECTED Final   Enterococcus Faecium NOT DETECTED NOT DETECTED Final   Listeria monocytogenes NOT DETECTED NOT DETECTED Final   Staphylococcus species NOT DETECTED NOT DETECTED Final   Staphylococcus aureus (BCID) NOT DETECTED NOT DETECTED Final   Staphylococcus epidermidis NOT DETECTED NOT DETECTED Final   Staphylococcus lugdunensis NOT DETECTED NOT DETECTED Final   Streptococcus species DETECTED (A) NOT DETECTED Final    Comment: Not Enterococcus species, Streptococcus agalactiae, Streptococcus pyogenes, or Streptococcus pneumoniae. CRITICAL RESULT CALLED TO, READ BACK BY AND VERIFIED WITH: PHARMD ANH PHAM ON 04/07/23 @ 1345 BY DRT    Streptococcus agalactiae NOT DETECTED NOT DETECTED Final   Streptococcus pneumoniae NOT DETECTED NOT DETECTED Final   Streptococcus pyogenes NOT DETECTED NOT DETECTED Final   A.calcoaceticus-baumannii NOT DETECTED NOT DETECTED Final   Bacteroides fragilis NOT DETECTED NOT DETECTED Final   Enterobacterales NOT DETECTED NOT DETECTED Final   Enterobacter cloacae complex NOT DETECTED NOT DETECTED Final   Escherichia coli NOT DETECTED NOT DETECTED Final   Klebsiella aerogenes NOT DETECTED NOT DETECTED Final   Klebsiella oxytoca NOT DETECTED NOT DETECTED Final   Klebsiella pneumoniae NOT DETECTED NOT DETECTED Final   Proteus species NOT DETECTED NOT DETECTED Final   Salmonella species NOT DETECTED NOT DETECTED Final   Serratia marcescens NOT DETECTED NOT DETECTED Final   Haemophilus influenzae NOT DETECTED NOT DETECTED Final   Neisseria meningitidis NOT DETECTED NOT DETECTED Final   Pseudomonas aeruginosa NOT DETECTED NOT DETECTED Final   Stenotrophomonas maltophilia NOT DETECTED NOT DETECTED Final   Candida albicans NOT DETECTED NOT DETECTED Final   Candida auris NOT  DETECTED NOT DETECTED Final   Candida glabrata NOT DETECTED NOT DETECTED Final   Candida krusei NOT DETECTED NOT DETECTED Final   Candida parapsilosis NOT DETECTED NOT DETECTED Final   Candida tropicalis NOT DETECTED NOT DETECTED Final   Cryptococcus neoformans/gattii NOT DETECTED NOT DETECTED Final    Comment: Performed at [facility], [address] [place], [place] [address]  MRSA Next Gen by PCR, Nasal     Status: None   Collection Time: 04/07/23 11:27 AM   Specimen: Nasal Mucosa; Nasal Swab  Result Value Ref Range Status   MRSA by PCR Next Gen NOT DETECTED NOT DETECTED Final    Comment: (NOTE) The GeneXpert MRSA Assay (FDA approved for NASAL specimens only), is one component of a comprehensive MRSA colonization surveillance program. It is not intended to diagnose MRSA infection nor to guide or monitor treatment for MRSA infections. Test performance is not FDA approved in patients less than 3 years old. Performed at [facility], [address], [place], [place] [address]   Respiratory (~20 pathogens) panel by PCR     Status: Abnormal   Collection Time: 04/07/23  3:02 PM   Specimen: Nasopharyngeal Swab; Respiratory  Result  Value Ref Range Status   Adenovirus NOT DETECTED NOT DETECTED Corrected   Coronavirus 229E NOT DETECTED NOT DETECTED Corrected    Comment: (NOTE) The Coronavirus on the Respiratory Panel, DOES NOT test for the novel  Coronavirus (2019 nCoV) CORRECTED ON 02/24 AT 2001: PREVIOUSLY REPORTED AS NOT DETECTED    Coronavirus HKU1 NOT DETECTED NOT DETECTED Corrected   Coronavirus NL63 NOT DETECTED NOT DETECTED Corrected   Coronavirus OC43 NOT DETECTED NOT DETECTED Corrected   Metapneumovirus NOT DETECTED NOT DETECTED Corrected   Rhinovirus / Enterovirus NOT DETECTED NOT DETECTED Corrected   Influenza A EQUIVOCAL (A) NOT DETECTED Corrected    Comment: Referred to [facility] in [place], [place] for serotyping.   Influenza B NOT  DETECTED NOT DETECTED Corrected   Parainfluenza Virus 1 NOT DETECTED NOT DETECTED Corrected   Parainfluenza Virus 2 NOT DETECTED NOT DETECTED Corrected   Parainfluenza Virus 3 NOT DETECTED NOT DETECTED Corrected   Parainfluenza Virus 4 NOT DETECTED NOT DETECTED Corrected   Respiratory Syncytial Virus NOT DETECTED NOT DETECTED Corrected   Bordetella pertussis NOT DETECTED NOT DETECTED Corrected   Bordetella Parapertussis NOT DETECTED NOT DETECTED Corrected   Chlamydophila pneumoniae NOT DETECTED NOT DETECTED Corrected   Mycoplasma pneumoniae NOT DETECTED NOT DETECTED Corrected    Comment: Performed at [facility], [address], [place], [place] [address]   Labs: CBC: Recent Labs  Lab 09/02/23 1931 09/03/23 0957 09/03/23 1316 09/04/23 0342  WBC 7.7 7.8 7.3 6.6  NEUTROABS 4.6 5.1  --  3.7  HGB 11.9* 10.9* 11.3* 11.0*  HCT 40.5 36.4* 38.3* 36.5*  MCV 79.7* 79.8* 79.5* 78.0*  PLT 270 238 257 241   Basic Metabolic Panel: Recent Labs  Lab 09/02/23 1931 09/03/23 0104 09/03/23 0957 09/03/23 1316 09/04/23 0342  NA 141  --  141  --  139  K 2.7*  --  3.0*  --  3.2*  CL 101  --  100  --  98  CO2 31  --  32  --  32  GLUCOSE 99  --  110*  --  112*  BUN 10  --  10  --  14  CREATININE 1.40*  --  1.30* 1.28* 1.48*  CALCIUM 8.7*  --  8.3*  --  8.5*  MG  --  1.8 1.8  --  1.8  PHOS  --   --  3.7  --  3.6   Liver Function Tests: Recent Labs  Lab 09/02/23 1931 09/03/23 0957 09/04/23 0342  AST 17 14* 14*  ALT 17 15 14   ALKPHOS 77 67 64  BILITOT 0.4 0.4 0.4  PROT 7.8 6.7 6.8  ALBUMIN 3.4* 3.0* 2.9*   CBG: No results for input(s): GLUCAP in the last 168 hours.  Discharge time spent: greater than 30 minutes.  Signed: Alejandro Marker, DO [facility] 09/06/2023

## 2023-09-06 NOTE — Hospital Course (Signed)
 Jared Mccoy is a super morbidly obese 33 y.o. adult with medical history significant for but not limited to peripheral vascular disease, essential hypertension, paroxysmal atrial fibrillation not on anticoagulation as well as other comorbidities who presented to the hospital with worsening pain in his lower extremities as well as worsening swelling with weeping.  He states that this has been progressively worsening for last week or so but over the last few days it acutely worsened in the setting of the last day significantly started weeping.  States that legs were so swollen they started weeping clear fluid and states that they feel extremely heavy.  He did report some mild shortness of breath but denied a cough or fever.  Currently has not been compliant with his diuretics and recently was taken off of Lasix  due to his persistent hypokalemia and was supposed to be switched to spironolactone .  Denies any chest discomfort, burning or discomfort in his urine, lightheadedness or dizziness.  Given his symptoms this prompted him to be evaluated in the ED and [facility] is now admitting this patient for worsening right extremity swelling and cardiology has been consulted and given his volume overload for further evaluation.   **Interim History: Cardiology felt that his leg swelling was not related to cardiac in origin and they recommended Unna boots and wound care evaluation.  Patient was given some diuresis and his leg swelling and pain improved.  He is improved and blood pressure improved as well and he was deemed medically stable for discharge he will need to follow-up with PCP and cardiology outpatient setting as well as the lymphedema clinic.  Assessment and Plan:  LE Pain and Bilateral Leg Swelling in the setting of Lymphedema and chronic venous stasis changes: Diurese with IV Lasix  and Pain Control. Check LE Duplex to r/o DVT. WOC nurse consulted and recommending Lymphedema Clinic.  Currently getting Unna boots  and given a dose of IV Lasix  4 mg x 1.  Cardiology feels that his lower extremity swelling is not in favor of a cardiac etiology.  TSH was 1.75.  Cardiology has cleared the patient for discharge given that his legs are improved and cardiology feel he has no significant valvular disease.  He will need outpatient wound care and has a pending appointment with the vein center for long-term management   Volume Overload: Improved cardiology consulted. BNP not elevated and recent ECHO showed no evidence of CHF but he does have Cardiomegaly and did have some slight SOB prior to admission.  Given diuresis and his lower extremity swelling is in the setting of venous stasis and lymphedema.  Lasix  is now stopped.   Essential HTN: Uncontrolled.  Has a history of noncompliance.  Resume Home Antihypertensives w/ Amlodipine  10 mg po daily, Carvedilol  25 mg po BID, and Hydralazine  50 mg po TID. C/w IV Hydralazine  10 mg q6hprn for HBP.  Also getting IV Lasix  cardiology consulted for further evaluation and recommendations. CTM BP per Protocol. Last BP reading was 154/82.  Cardiology feels that they are not been able to get his blood pressure at goal and will need outpatient follow-up.  Had appointment with advanced hypertension clinic but did not follow-up has been referred again.  Cardiology made some further adjustments.  Patient will be discharged on amlodipine  10 mg p.o. daily, carvedilol  25 1 p.o. twice daily, hydralazine  100 mg 3 times daily and spironolactone  25 mg twice daily and not use furosemide  given the hypokalemia   PAF: Monitor on Telemetry; Cards Consulted for further  evaluation. Not on AC due to CHA2DS2-VASc of 1   Hypokalemia: K+ is now 3.2. Replete prior to D/C. CTM and Replete as Necessary. Repeat CMP in the AM    Renal Insufficiency: BUN/Cr Trend: Recent Labs  Lab 09/02/23 1931 09/03/23 0957 09/03/23 1316 09/04/23 0342  BUN 10 10  --  14  CREATININE 1.40* 1.30*   < > 1.48*   < > = values in this  interval not displayed.  -Getting Diuresed as Above -Avoid Nephrotoxic Medications, Contrast Dyes, Hypotension and Dehydration to Ensure Adequate Renal Perfusion and will need to Renally Adjust Meds -Continue to Monitor and Trend Renal Function carefully and repeat CMP in the AM    Cocaine Abuse: May have contributed to his decompensation as above.  Cocaine is positive and will need continued counseling   Microcyctic Anemia: Hgb/Hct Trend:  Recent Labs  Lab 09/02/23 1931 09/03/23 0957 09/03/23 1316 09/04/23 0342  HGB 11.9* 10.9* 11.3* 11.0*  HCT 40.5 36.4* 38.3* 36.5*  MCV 79.7* 79.8* 79.5* 78.0*  -Checked Anemia Panel showed an iron level of 24, UIBC 361, TIBC 385, saturation ratios of 6%, ferritin 117, folate level 7.6 vitamin B12 365. CTM for S/Sx of Bleeding; No overt bleeding noted. Repeat CBC within 1 week   Hypoalbuminemia: Patient's Albumin Lvl Trending down and went from 3.4 -> 3.0 -> 2.9. CTM and Trend and repeat CMP within 1 week   Class III (Super Morbid) Obesity: Complicates overall prognosis and care -Estimated body mass index is 62.4 kg/m as calculated from the following:   Height as of this encounter: 6' 6 (1.981 m).   Weight as of this encounter: 244.9 kg. Weight Loss and Dietary Counseling given   Suspected OSA: Will need outpatient sleep study and will place him on CPAP nightly here

## 2023-09-07 ENCOUNTER — Other Ambulatory Visit: Payer: Self-pay | Admitting: Primary Care

## 2023-09-07 DIAGNOSIS — L089 Local infection of the skin and subcutaneous tissue, unspecified: Secondary | ICD-10-CM

## 2023-09-07 NOTE — Progress Notes (Signed)
 Orders place for [facility] to evaluate and tx wound

## 2023-09-08 ENCOUNTER — Telehealth: Payer: Self-pay

## 2023-09-08 ENCOUNTER — Telehealth (INDEPENDENT_AMBULATORY_CARE_PROVIDER_SITE_OTHER): Payer: Self-pay

## 2023-09-08 NOTE — Transitions of Care (Post Inpatient/ED Visit) (Signed)
 09/08/2023  Name: Jared Mccoy MRN: [id number] DOB: 1990-11-09  Today's TOC FU Call Status: Today's TOC FU Call Status:: Successful TOC FU Call Completed TOC FU Call Complete Date: 09/08/23 Patient's Name and Date of Birth confirmed.  Transition Care Management Follow-up Telephone Call Date of Discharge: 09/04/23 Discharge Facility: [facility]) Type of Discharge: Inpatient Admission Primary Inpatient Discharge Diagnosis:: peripheral edema How have you been since you were released from the hospital?: Same Any questions or concerns?: No  Items Reviewed: Did you receive and understand the discharge instructions provided?: Yes Medications obtained,verified, and reconciled?: Yes (Medications Reviewed) (he has all medications and did not have any questions about the med regime) Any new allergies since your discharge?: No Dietary orders reviewed?: Yes Type of Diet Ordered:: heart healthy,low sodium Do you have support at home?: Yes People in Home [RPT]: sibling(s) Name of Support/Comfort Primary Source: he stated his sister is helping him and he is staying with her  Medications Reviewed Today: Medications Reviewed Today     Reviewed by Marvis Bradley, RN (Case Manager) on 09/08/23 at 1244  Med List Status: <None>   Medication Order Taking? Sig Documenting Provider Last Dose Status Informant  acetaminophen  (TYLENOL ) 325 MG tablet [id number]  Take 2 tablets (650 mg total) by mouth every 6 (six) hours as needed for mild pain (pain score 1-3) or fever (or Fever >/= 101). Sheikh, Omair [place], DO  Active   amLODipine  (NORVASC ) 10 MG tablet [id number]  Take 1 tablet (10 mg total) by mouth daily. Sheikh, Omair [place], [place]  Active   carvedilol  (COREG ) 25 MG tablet [id number]  Take 1 tablet (25 mg total) by mouth 2 (two) times daily with a meal. Sherrill, Omair [place], DO  Active   hydrALAZINE  (APRESOLINE ) 100 MG tablet [id number]  Take 1 tablet (100 mg total) by mouth every 8 (eight) hours. Sheikh,  Omair [place], DO  Active   irbesartan  (AVAPRO ) 150 MG tablet [id number]  Take 1 tablet (150 mg total) by mouth daily. Sheikh, Omair [place], DO  Active   ondansetron  (ZOFRAN ) 4 MG tablet [id number]  Take 1 tablet (4 mg total) by mouth every 6 (six) hours as needed for nausea. Sheikh, Omair [place], [place]  Active   spironolactone  (ALDACTONE ) 25 MG tablet [id number]  Take 1 tablet (25 mg total) by mouth 2 (two) times daily. Sherrill Cable [place], [place]  Active             Home Care and Equipment/Supplies: Were Home Health Services Ordered?: No Any new equipment or medical supplies ordered?: No (He has instructions for wound care /Unna Boots that may require every other day changing but he has no supplies and no one to do the wound care)  Functional Questionnaire: Do you need assistance with bathing/showering or dressing?: No Do you need assistance with meal preparation?: No Do you need assistance with eating?: No Do you have difficulty maintaining continence: No Do you need assistance with getting out of bed/getting out of a chair/moving?: No Do you have difficulty managing or taking your medications?: No  Follow up appointments reviewed: PCP Follow-up appointment confirmed?: Yes Date of PCP follow-up appointment?: 09/11/23 Follow-up Provider: Rosaline Bohr, NP Specialist Hospital Follow-up appointment confirmed?: Yes Date of Specialist follow-up appointment?: 09/11/23 Follow-Up Specialty Provider:: HTN clinic.    09/12/2023- wound care clinic.  09/18/2023- [facility] Do you need transportation to your follow-up appointment?: No Do you understand care options if your condition(s) worsen?: Yes-patient verbalized understanding  SIGNATURE Slater Diesel, RN

## 2023-09-08 NOTE — Telephone Encounter (Signed)
 From the [facility] call:  He has instructions for wound care /Unna Boots that may require every other day changing but he has no supplies and no one to do the wound care  He has an appointment at [facility] 09/11/2023 and an appointment at the wound clinic 09/12/2023

## 2023-09-08 NOTE — Telephone Encounter (Signed)
 Copied from CRM [id number]. Topic: Clinical - Medical Advice >> Sep 08, 2023 12:17 PM Nathanel BROCKS wrote: Reason for CRM:  pt is going to need home health for unna boot to apply on the boot. This will be for both legs.   ----------------------------------------------------------------------- From previous Reason for Contact - Home Health Verbal Orders: Caller/Agency:  Callback Number:  Service Requested:   Frequency:  Any new concerns about the patient?

## 2023-09-11 ENCOUNTER — Ambulatory Visit (INDEPENDENT_AMBULATORY_CARE_PROVIDER_SITE_OTHER): Admitting: Primary Care

## 2023-09-11 ENCOUNTER — Encounter (HOSPITAL_BASED_OUTPATIENT_CLINIC_OR_DEPARTMENT_OTHER): Payer: Self-pay | Admitting: Family

## 2023-09-11 ENCOUNTER — Encounter (INDEPENDENT_AMBULATORY_CARE_PROVIDER_SITE_OTHER): Payer: Self-pay | Admitting: Primary Care

## 2023-09-11 ENCOUNTER — Other Ambulatory Visit: Payer: Self-pay

## 2023-09-11 ENCOUNTER — Ambulatory Visit (HOSPITAL_BASED_OUTPATIENT_CLINIC_OR_DEPARTMENT_OTHER): Admitting: Family

## 2023-09-11 VITALS — BP 123/80 | HR 81 | Resp 16 | Wt >= 6400 oz

## 2023-09-11 VITALS — BP 115/67 | HR 88 | Ht 72.0 in | Wt >= 6400 oz

## 2023-09-11 DIAGNOSIS — Z23 Encounter for immunization: Secondary | ICD-10-CM | POA: Diagnosis not present

## 2023-09-11 DIAGNOSIS — L089 Local infection of the skin and subcutaneous tissue, unspecified: Secondary | ICD-10-CM

## 2023-09-11 DIAGNOSIS — I890 Lymphedema, not elsewhere classified: Secondary | ICD-10-CM | POA: Diagnosis not present

## 2023-09-11 DIAGNOSIS — R600 Localized edema: Secondary | ICD-10-CM

## 2023-09-11 DIAGNOSIS — Z6841 Body Mass Index (BMI) 40.0 and over, adult: Secondary | ICD-10-CM | POA: Diagnosis not present

## 2023-09-11 DIAGNOSIS — R7303 Prediabetes: Secondary | ICD-10-CM

## 2023-09-11 DIAGNOSIS — S81809A Unspecified open wound, unspecified lower leg, initial encounter: Secondary | ICD-10-CM | POA: Diagnosis not present

## 2023-09-11 DIAGNOSIS — I10 Essential (primary) hypertension: Secondary | ICD-10-CM

## 2023-09-11 DIAGNOSIS — G4733 Obstructive sleep apnea (adult) (pediatric): Secondary | ICD-10-CM | POA: Diagnosis not present

## 2023-09-11 DIAGNOSIS — E662 Morbid (severe) obesity with alveolar hypoventilation: Secondary | ICD-10-CM

## 2023-09-11 MED ORDER — SEMAGLUTIDE-WEIGHT MANAGEMENT 0.25 MG/0.5ML ~~LOC~~ SOAJ
0.2500 mg | SUBCUTANEOUS | 0 refills | Status: AC
  Filled 2023-09-11 – 2023-10-06 (×2): qty 2, 28d supply, fill #0

## 2023-09-11 MED ORDER — HYDRALAZINE HCL 100 MG PO TABS
100.0000 mg | ORAL_TABLET | Freq: Three times a day (TID) | ORAL | 0 refills | Status: DC
  Filled 2023-09-11 – 2023-11-06 (×3): qty 90, 30d supply, fill #0

## 2023-09-11 MED ORDER — IRBESARTAN 150 MG PO TABS
150.0000 mg | ORAL_TABLET | Freq: Every day | ORAL | 0 refills | Status: DC
  Filled 2023-09-11 – 2023-11-06 (×3): qty 30, 30d supply, fill #0

## 2023-09-11 MED ORDER — CARVEDILOL 25 MG PO TABS
25.0000 mg | ORAL_TABLET | Freq: Two times a day (BID) | ORAL | 0 refills | Status: DC
  Filled 2023-09-11 – 2023-11-06 (×3): qty 60, 30d supply, fill #0

## 2023-09-11 MED ORDER — AMLODIPINE BESYLATE 10 MG PO TABS
10.0000 mg | ORAL_TABLET | Freq: Every day | ORAL | 0 refills | Status: DC
  Filled 2023-09-11 – 2023-11-06 (×3): qty 30, 30d supply, fill #0

## 2023-09-11 MED ORDER — SEMAGLUTIDE-WEIGHT MANAGEMENT 1.7 MG/0.75ML ~~LOC~~ SOAJ
1.7000 mg | SUBCUTANEOUS | 0 refills | Status: DC
  Filled 2023-09-11: qty 3, 28d supply, fill #0

## 2023-09-11 MED ORDER — SEMAGLUTIDE-WEIGHT MANAGEMENT 0.5 MG/0.5ML ~~LOC~~ SOAJ
0.5000 mg | SUBCUTANEOUS | 0 refills | Status: DC
  Filled 2023-09-11 – 2023-09-30 (×2): qty 2, 28d supply, fill #0

## 2023-09-11 MED ORDER — SEMAGLUTIDE-WEIGHT MANAGEMENT 1 MG/0.5ML ~~LOC~~ SOAJ
1.0000 mg | SUBCUTANEOUS | 0 refills | Status: DC
  Filled 2023-09-11 – 2023-09-30 (×2): qty 2, 28d supply, fill #0

## 2023-09-11 MED ORDER — SPIRONOLACTONE 25 MG PO TABS
25.0000 mg | ORAL_TABLET | Freq: Two times a day (BID) | ORAL | 0 refills | Status: DC
  Filled 2023-09-11 – 2023-11-06 (×2): qty 60, 30d supply, fill #0

## 2023-09-11 MED ORDER — SEMAGLUTIDE-WEIGHT MANAGEMENT 2.4 MG/0.75ML ~~LOC~~ SOAJ
2.4000 mg | SUBCUTANEOUS | 0 refills | Status: DC
  Filled 2023-09-11: qty 3, 28d supply, fill #0

## 2023-09-11 NOTE — Progress Notes (Signed)
 [facility]   Jared Mccoy is a 33 y.o. adult presents for hypertension evaluation, Denies shortness of breath, headaches, chest pain or lower extremity edema, sudden onset, vision changes, unilateral weakness, dizziness, paresthesias . Patient was needing dental clearance but denied by PCP and dentist and until blood pressure was controlled.  Blood pressure was well-controlled readings recheck blood pressure with the same 1-2 points difference. Patient has a cardiology appointment this afternoon recommend he still needs to keep a 24-hour cancellation is required or he will be charged for no-show patient is aware and will be present at his appointment.  Patient reports adherence with medications.  Family / Social history: CVA father died 72  Past Medical History:  Diagnosis Date   Hypertension    not on medication   Obese    Peripheral vascular disease (HCC)    edema in legs    Past Surgical History:  Procedure Laterality Date   MANDIBULAR HARDWARE REMOVAL N/A 12/27/2016   Procedure: MANDIBULAR HARDWARE REMOVAL;  Surgeon: Karis Clunes, MD;  Location: [facility] OR;  Service: ENT;  Laterality: N/A;   OPEN REDUCTION INTERNAL FIXATION (ORIF) DISTAL RADIAL FRACTURE Left 11/10/2016   Procedure: OPEN REDUCTION INTERNAL FIXATION (ORIF) DISTAL RADIAL FRACTURE;  Surgeon: Sebastian Lenis, MD;  Location: [facility] OR;  Service: Orthopedics;  Laterality: Left;   ORIF MANDIBULAR FRACTURE Right 11/10/2016   Procedure: OPEN REDUCTION INTERNAL FIXATION (ORIF) MANDIBULAR FRACTURE, Ear Laceration Repair;  Surgeon: Karis Clunes, MD;  Location: [facility] OR;  Service: ENT;  Laterality: Right;   Allergies  Allergen Reactions   Crab (Diagnostic) Diarrhea and Nausea And Vomiting    Reaction only to crab legs    Current Outpatient Medications on File Prior to Visit  Medication Sig Dispense Refill   acetaminophen  (TYLENOL ) 325 MG tablet Take 2 tablets (650 mg total) by mouth every 6 (six) hours as needed for mild  pain (pain score 1-3) or fever (or Fever >/= 101). 20 tablet 0   amLODipine  (NORVASC ) 10 MG tablet Take 1 tablet (10 mg total) by mouth daily. 30 tablet 0   carvedilol  (COREG ) 25 MG tablet Take 1 tablet (25 mg total) by mouth 2 (two) times daily with a meal. 60 tablet 0   hydrALAZINE  (APRESOLINE ) 100 MG tablet Take 1 tablet (100 mg total) by mouth every 8 (eight) hours. 90 tablet 0   irbesartan  (AVAPRO ) 150 MG tablet Take 1 tablet (150 mg total) by mouth daily. 30 tablet 0   ondansetron  (ZOFRAN ) 4 MG tablet Take 1 tablet (4 mg total) by mouth every 6 (six) hours as needed for nausea. 20 tablet 0   spironolactone  (ALDACTONE ) 25 MG tablet Take 1 tablet (25 mg total) by mouth 2 (two) times daily. 60 tablet 0   No current facility-administered medications on file prior to visit.   Social History   Socioeconomic History   Marital status: Single    Spouse name: Not on file   Number of children: Not on file   Years of education: Not on file   Highest education level: Not on file  Occupational History   Not on file  Tobacco Use   Smoking status: Every Day    Current packs/day: 0.50    Average packs/day: 0.5 packs/day for 20.0 years (10.0 ttl pk-yrs)    Types: Cigarettes   Smokeless tobacco: Never   Tobacco comments:    I pack of cigarettes a day. 07/09/2023  Vaping Use   Vaping status: Never Used  Substance and Sexual  Activity   Alcohol use: Yes    Comment: 12/26/16- 1 fifth a week   Drug use: No   Sexual activity: Yes  Other Topics Concern   Not on file  Social History Narrative          Social Drivers of Health   Financial Resource Strain: Not on file  Food Insecurity: No Food Insecurity (09/03/2023)   Hunger Vital Sign    Worried About Running Out of Food in the Last Year: Never true    Ran Out of Food in the Last Year: Never true  Transportation Needs: No Transportation Needs (09/03/2023)   PRAPARE - Administrator, Civil Service (Medical): No    Lack of  Transportation (Non-Medical): No  Physical Activity: Not on file  Stress: Not on file  Social Connections: Not on file  Intimate Partner Violence: Not At Risk (09/03/2023)   Humiliation, Afraid, Rape, and Kick questionnaire    Fear of Current or Ex-Partner: No    Emotionally Abused: No    Physically Abused: No    Sexually Abused: No   No family history on file. Health Maintenance  Topic Date Due   Medicare Annual Wellness (AWV)  Never done   Pneumococcal Vaccine 56-64 Years old (1 of 2 - PCV) Never done   Hepatitis B Vaccines (1 of 3 - 19+ 3-dose series) Never done   HPV VACCINES (1 - 3-dose SCDM series) Never done   COVID-19 Vaccine (1 - 2024-25 season) Never done   INFLUENZA VACCINE  09/12/2023   DTaP/Tdap/Td (5 - Td or Tdap) 08/11/2029   Hepatitis C Screening  Completed   HIV Screening  Completed   Meningococcal B Vaccine  Aged Out     OBJECTIVE: BP 123/80 (BP Location: Left Arm, Patient Position: Sitting) Comment (Cuff Size): thigh  Pulse 81   Resp 16   Wt (!) 550 lb (249.5 kg)   SpO2 97%   BMI 63.56 kg/m    Physical Exam Vitals reviewed.  Constitutional:      Appearance: He is obese.     Comments: Severe morbid obesity   HENT:     Head: Normocephalic.     Nose: Nose normal.  Cardiovascular:     Rate and Rhythm: Normal rate and regular rhythm.  Pulmonary:     Effort: Pulmonary effort is normal.     Breath sounds: Normal breath sounds.  Abdominal:     General: Bowel sounds are normal. There is distension.     Palpations: Abdomen is soft.  Musculoskeletal:        General: Swelling present. Normal range of motion.     Cervical back: Normal range of motion and neck supple.     Right lower leg: Edema present.     Left lower leg: Edema present.  Skin:    Findings: Erythema present.  Neurological:     Mental Status: He is alert and oriented to person, place, and time.     Motor: Weakness present.     Gait: Gait abnormal.  Psychiatric:        Mood and  Affect: Mood normal.        Behavior: Behavior normal.        Thought Content: Thought content normal.        Judgment: Judgment normal.      ROS  Last 3 Office BP readings: BP Readings from Last 3 Encounters:  09/04/23 137/65  08/11/23 (!) 162/100  07/29/23 (!) 140/78  BMET    Component Value Date/Time   NA 139 09/04/2023 0342   K 3.2 (L) 09/04/2023 0342   CL 98 09/04/2023 0342   CO2 32 09/04/2023 0342   GLUCOSE 112 (H) 09/04/2023 0342   BUN 14 09/04/2023 0342   CREATININE 1.48 (H) 09/04/2023 0342   CALCIUM 8.5 (L) 09/04/2023 0342   GFRNONAA >60 09/04/2023 0342   GFRAA >60 08/12/2019 2123    Renal function: Estimated Creatinine Clearance (by C-G formula based on SCr of 1.48 mg/dL (H)) Male: 873.0 mL/min (A) Male: 152 mL/min (A)  Clinical ASCVD: No  The ASCVD Risk score (Arnett DK, et al., 2019) failed to calculate for the following reasons:   The 2019 ASCVD risk score is only valid for ages 23 to 75  ASCVD risk factors include- [place]   ASSESSMENT & PLAN: Neng was seen today for blood pressure check.  Diagnoses and all orders for this visit:  Essential hypertension Well control  DIET: Limit salt intake, read nutrition labels to check salt content, limit fried and high fatty foods  Avoid using multisymptom OTC cold preparations that generally contain sudafed which can rise BP. Consult with pharmacist on best cold relief products to use for persons with HTN EXERCISE Discussed incorporating exercise such as walking - 30 minutes most days of the week and can do in 10 minute intervals   . Patient has cardiology appointment this afternoon evaluation  Lymphedema 2/2 Wound infection close Previously treated by Dr. Harden appointment scheduled patient's legs started losing out fluids he went to the hospital and was admitted and missed his appointment Appointment with Dr. Ozell Stallion at the wound care center tomorrow  Obesity hypoventilation syndrome  (HCC) Obesity hypoventilation syndrome is a condition in which severely overweight people fail to breathe rapidly or deeply enough, resulting in low oxygen levels and high blood carbon dioxide levels. The syndrome is often associated with obstructive sleep apnea, which causes periods of absent or reduced breathing in sleep, resulting in many partial awakenings during the night and sleepiness during the day. The disease puts strain on the heart, which may lead to heart failure and leg swelling.     This note has been created with Education officer, environmental. Any transcriptional errors are unintentional.   [name], NP 09/11/2023, 10:27 AM

## 2023-09-11 NOTE — Progress Notes (Signed)
 [facility] Initial Assessment:    Date:  09/16/2023   ID:  Jared Mccoy, DOB January 17, 1991, MRN [id number]  PCP:  Celestia Rosaline SQUIBB, NP  Cardiologist:  None  Nephrologist:  Referring MD: Celestia Rosaline SQUIBB, NP   CC: Hypertension  History of Present Illness:    Jared Mccoy is a 33 y.o. adult with a hx of with history of chronic lymphedema, HTN, OSA on BiPAP, PAF not on OAC due to low CHA2DS2-VASc score.  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  He was IV diuresed.  He was discharged on amlodipine  10 mg daily, carvedilol  25 mg twice daily, hydralazine  100 mg 3 times daily, spironolactone  25 mg twice daily.  Lasix  deferred due to history of significant hypokalemia.  Here to establish with [facility].  Hypertensive BP readings noted as far back as 2014 in medical record.  Since hospital discharge she reports stable exertional dyspnea.  No orthopnea, PND.  Persistent bilateral lower extremity edema and has upcoming visit with wound clinic as well as vascular team. BP at home 170-190 with arm cuff which he uses on his forearm.  Endorses adherence to his present antihypertensive regimen taking medications at 10A, 1P, 6P. Reports episode of lightheadedness getting out of the car today and reports body felt weird.  He did have a sandwich earlier today.  Previous antihypertensives: Lasix -hypokalemia  Past Medical History:  Diagnosis Date   Hypertension    not on medication   Obese    Peripheral vascular disease (HCC)    edema in legs     Past Surgical History:  Procedure Laterality Date   MANDIBULAR HARDWARE REMOVAL N/A 12/27/2016   Procedure: MANDIBULAR HARDWARE REMOVAL;  Surgeon: Karis Clunes, MD;  Location: [facility] OR;  Service: ENT;  Laterality: N/A;   OPEN REDUCTION INTERNAL FIXATION (ORIF) DISTAL RADIAL  FRACTURE Left 11/10/2016   Procedure: OPEN REDUCTION INTERNAL FIXATION (ORIF) DISTAL RADIAL FRACTURE;  Surgeon: Sebastian Lenis, MD;  Location: [facility] OR;  Service: Orthopedics;  Laterality: Left;   ORIF MANDIBULAR FRACTURE Right 11/10/2016   Procedure: OPEN REDUCTION INTERNAL FIXATION (ORIF) MANDIBULAR FRACTURE, Ear Laceration Repair;  Surgeon: Karis Clunes, MD;  Location: [facility] OR;  Service: ENT;  Laterality: Right;    Current Medications: Current Meds  Medication Sig   acetaminophen  (TYLENOL ) 325 MG tablet Take 2 tablets (650 mg total) by mouth every 6 (six) hours as needed for mild pain (pain score 1-3) or fever (or Fever >/= 101).   ondansetron  (ZOFRAN ) 4 MG tablet Take 1 tablet (4 mg total) by mouth every 6 (six) hours as needed for nausea.   Semaglutide -Weight Management 0.25 MG/0.5ML SOAJ Inject 0.25 mg into the skin once a week for 28 days.   [START ON 10/10/2023] Semaglutide -Weight Management 0.5 MG/0.5ML SOAJ Inject 0.5 mg into the skin once a week for 28 days.   [START ON 11/08/2023] Semaglutide -Weight Management 1 MG/0.5ML SOAJ Inject 1 mg into the skin once a week for 28 days.   [START ON 12/07/2023] Semaglutide -Weight Management 1.7 MG/0.75ML SOAJ Inject 1.7 mg into the skin once a week for 28 days.   [START ON 01/05/2024] Semaglutide -Weight Management 2.4 MG/0.75ML SOAJ Inject 2.4 mg into the skin once a week for 28 days.   [DISCONTINUED] amLODipine  (NORVASC ) 10 MG tablet Take 1 tablet (10 mg total) by mouth daily.   [DISCONTINUED] carvedilol  (COREG ) 25 MG  tablet Take 1 tablet (25 mg total) by mouth 2 (two) times daily with a meal.   [DISCONTINUED] hydrALAZINE  (APRESOLINE ) 100 MG tablet Take 1 tablet (100 mg total) by mouth every 8 (eight) hours.   [DISCONTINUED] irbesartan  (AVAPRO ) 150 MG tablet Take 1 tablet (150 mg total) by mouth daily.   [DISCONTINUED] spironolactone  (ALDACTONE ) 25 MG tablet Take 1 tablet (25 mg total) by mouth 2 (two) times daily.     Allergies:   Crab (diagnostic)    Social History   Socioeconomic History   Marital status: Single    Spouse name: Not on file   Number of children: Not on file   Years of education: Not on file   Highest education level: Not on file  Occupational History   Not on file  Tobacco Use   Smoking status: Every Day    Current packs/day: 0.50    Average packs/day: 0.5 packs/day for 20.0 years (10.0 ttl pk-yrs)    Types: Cigarettes   Smokeless tobacco: Never   Tobacco comments:    I pack of cigarettes a day. 07/09/2023  Vaping Use   Vaping status: Never Used  Substance and Sexual Activity   Alcohol use: Yes    Comment: 12/26/16- 1 fifth a week   Drug use: No   Sexual activity: Yes  Other Topics Concern   Not on file  Social History Narrative   ** Merged History Encounter **       Social Drivers of Health   Financial Resource Strain: Not on file  Food Insecurity: No Food Insecurity (09/03/2023)   Hunger Vital Sign    Worried About Running Out of Food in the Last Year: Never true    Ran Out of Food in the Last Year: Never true  Transportation Needs: No Transportation Needs (09/03/2023)   PRAPARE - Administrator, Civil Service (Medical): No    Lack of Transportation (Non-Medical): No  Physical Activity: Not on file  Stress: Not on file  Social Connections: Not on file     Family History: The patient's family history is not on file.  ROS:   Please see the history of present illness.     All other systems reviewed and are negative.  EKGs/Labs/Other Studies Reviewed:         Recent Labs: 09/03/2023: B Natriuretic Peptide 29.4; TSH 1.715 09/11/2023: ALT 13; BUN 15; Creatinine, Ser 1.36; Hemoglobin 10.9; Magnesium  1.9; Platelets 277; Potassium 3.5; Sodium 142   Recent Lipid Panel    Component Value Date/Time   CHOL 164 07/12/2023 1454   TRIG 65 07/12/2023 1454   HDL 39 (L) 07/12/2023 1454   CHOLHDL 4.2 07/12/2023 1454   VLDL 13 07/12/2023 1454   LDLCALC 112 (H) 07/12/2023 1454     Physical Exam:   VS:  BP 115/67   Pulse 88   Ht 6' (1.829 m)   Wt (!) 553 lb 9.6 oz (251.1 kg)   SpO2 96%   BMI 75.08 kg/m  , BMI Body mass index is 75.08 kg/m. GENERAL:  Well appearing, morbidly obese HEENT: Pupils equal round and reactive, fundi not visualized, oral mucosa unremarkable NECK:  No jugular venous distention, waveform within normal limits, carotid upstroke brisk and symmetric, no bruits, no thyromegaly LYMPHATICS:  No cervical adenopathy LUNGS:  Clear to auscultation bilaterally HEART:  RRR.  PMI not displaced or sustained,S1 and S2 within normal limits, no S3, no S4, no clicks, no rubs, no murmurs ABD:  Flat, positive bowel  sounds normal in frequency in pitch, no bruits, no rebound, no guarding, no midline pulsatile mass, no hepatomegaly, no splenomegaly EXT:  2 plus pulses throughout, no cyanosis no clubbing.  Marked bilateral lower extremity lymphedema SKIN:  No rashes no nodules NEURO:  Cranial nerves II through XII grossly intact, motor grossly intact throughout PSYCH:  Cognitively intact, oriented to person place and time   ASSESSMENT/PLAN:    Resistant HTN -BP now at goal less than 130/80.  Suspect home cuff inaccurate due to size, request he bring to next OF. Continue present antihypertensive regimen amlodipine  10 mg daily, carvedilol  25 mg twice daily, hydralazine  100 mg 3 times daily, irbesartan  150 mg daily, spironolactone  25 mg twice daily.   Encouraged to take Hydralazine  ~8 hours apart for best coverage. Labs today BMP, mag, CBC, phosphorus per hospital discharge instructions. Recommend aiming for 150 minutes of moderate intensity activity per week and following a heart healthy diet.   Secondary workup 08/2023 normal TSH OSA treated Hesitant to stop Spironolactone  to test for hyperaldosteronism 2018 CT normal adrenals Low suspicion renal duplex would be diagnostic due to body habitus. Could consider CTA abd for evaluation in future  Morbid  obesity- Weight loss via diet and exercise encouraged. Discussed the impact being overweight would have on cardiovascular risk. prior Auth attempted for Wegovy  but not covered by his insurance for weight loss.   Will attempt prior authorization for Zepbound for OSA.   Refer to PREP exercise program.  OSA-continue BiPAP  PAF- RRR by auscultation today.  Not on OAC due to low CHA2DS2-VASc score.  Lymphedema- upcoming visits to establish with wound care and vascular.  Screening for Secondary Hypertension:     Relevant Labs/Studies:    Latest Ref Rng & Units 09/11/2023    2:22 PM 09/04/2023    3:42 AM 09/03/2023    1:16 PM  Basic Labs  Sodium 134 - 144 mmol/L 142  139    Potassium 3.5 - 5.2 mmol/L 3.5  3.2    Creatinine 0.76 - 1.27 mg/dL 8.63  8.51  8.71        Latest Ref Rng & Units 09/03/2023    9:57 AM 07/12/2023    2:54 PM  Thyroid    TSH 0.350 - 4.500 uIU/mL 1.715  3.030                  he  interested in enrolling in the PREP exercise and nutrition program through the [facility].     Disposition:    FU with MD/APP/PharmD in 2-3 months  -  Medication Adjustments/Labs and Tests Ordered: Current medicines are reviewed at length with the patient today.  Concerns regarding medicines are outlined above.  Orders Placed This Encounter  Procedures   CBC   Comp Met (CMET)   Magnesium    Phosphorus   Amb Referral To Provider Referral Exercise Program (P.R.E.P)   Ambulatory referral to Nutrition and Diabetic Education   Meds ordered this encounter  Medications   Semaglutide -Weight Management 0.25 MG/0.5ML SOAJ    Sig: Inject 0.25 mg into the skin once a week for 28 days.    Dispense:  2 mL    Refill:  0    Supervising Provider:   LONNI SLAIN [[id number]]   Semaglutide -Weight Management 0.5 MG/0.5ML SOAJ    Sig: Inject 0.5 mg into the skin once a week for 28 days.    Dispense:  2 mL    Refill:  0    Supervising Provider:   LONNI,  BRIDGETTE [[id number]]    Semaglutide -Weight Management 1 MG/0.5ML SOAJ    Sig: Inject 1 mg into the skin once a week for 28 days.    Dispense:  2 mL    Refill:  0    Supervising Provider:   LONNI SLAIN [[id number]]   Semaglutide -Weight Management 1.7 MG/0.75ML SOAJ    Sig: Inject 1.7 mg into the skin once a week for 28 days.    Dispense:  3 mL    Refill:  0    Supervising Provider:   LONNI SLAIN [[id number]]   Semaglutide -Weight Management 2.4 MG/0.75ML SOAJ    Sig: Inject 2.4 mg into the skin once a week for 28 days.    Dispense:  3 mL    Refill:  0    Supervising Provider:   LONNI SLAIN [[id number]]   spironolactone  (ALDACTONE ) 25 MG tablet    Sig: Take 1 tablet (25 mg total) by mouth 2 (two) times daily.    Dispense:  60 tablet    Refill:  0    Supervising Provider:   LONNI SLAIN [[id number]]   irbesartan  (AVAPRO ) 150 MG tablet    Sig: Take 1 tablet (150 mg total) by mouth daily.    Dispense:  30 tablet    Refill:  0    Supervising Provider:   CHRISTOPHER, BRIDGETTE [[id number]]   hydrALAZINE  (APRESOLINE ) 100 MG tablet    Sig: Take 1 tablet (100 mg total) by mouth every 8 (eight) hours.    Dispense:  90 tablet    Refill:  0    Supervising Provider:   LONNI SLAIN [[id number]]   carvedilol  (COREG ) 25 MG tablet    Sig: Take 1 tablet (25 mg total) by mouth 2 (two) times daily with a meal.    Dispense:  60 tablet    Refill:  0    Supervising Provider:   LONNI SLAIN [[id number]]   amLODipine  (NORVASC ) 10 MG tablet    Sig: Take 1 tablet (10 mg total) by mouth daily.    Dispense:  30 tablet    Refill:  0    Supervising Provider:   LONNI SLAIN [[id number]]     Signed, Reche GORMAN Finder, NP  09/16/2023 6:32 AM    [facility]

## 2023-09-11 NOTE — Patient Instructions (Signed)
 HPV Vaccine Information for Parents: What to Know Human papillomavirus (HPV) is a common virus. It is contagious, which means that it spreads from person to person through skin-to-skin or sexual contact. There are many types of HPV viruses. Genital or mucosal HPV can cause warts in the genitals Cutaneous or nonmucosal HPV can cause warts on the hands or feet. Some genital HPV types may cause cancer. Your child can get a shot, also called a vaccine, to help prevent the HPV types that can cause cancer, genital warts, or warts near the opening of the butt (anus). the shot is safe and effective. Getting the vaccine before your child is sexually active gives them the best protection from HPV through adulthood. What actions can I take to lower my child's risk for HPV? Have your child get the HPV vaccine before they become sexually active. The best time to get the shot is at around 58-87 years old. The vaccine may be given to children as young as 3 years old. If your child gets the vaccine before they are 43 years old, it can be given as 2 shots, 6-12 months apart. In some cases, 3 shots are needed. What are the risks and benefits of the HPV vaccine? Benefits Getting the vaccine can help prevent certain cancers. These include: Mouth or throat cancer. Anal cancer. Cancers of the cervix, vulva, and vagina in females. Penile cancer in males. Your child is less likely to get these cancers if they get the vaccine before they become sexually active. The vaccine also prevents genital warts caused by HPV. Risks In rare cases, side effects and reactions have been reported. These include: Soreness, redness, or swelling at the injection site. Dizziness or fainting. Fever. Headache. Muscle or joint pain. Who should not get the HPV vaccine or wait to get it? Some children should not get the HPV vaccine or should wait to get it. Ask the health care provider if your child should get the vaccine if: Your child  has had a severe allergic reaction to other vaccines. Your child is allergic to yeast. Your child has a fever. Your child has had a recent illness. Your child is pregnant or may be pregnant. Where to find more information To learn more: Go to [employer].no Click "Health Topics." Type "HPV" in the search box. Go to healthychildren.org Type "HPV" in the search box. This information is not intended to replace advice given to you by your health care provider. Make sure you discuss any questions you have with your health care provider. Document Revised: 11/01/2022 Document Reviewed: 11/01/2022 Elsevier Patient Education  2025 [employer].

## 2023-09-11 NOTE — Patient Instructions (Signed)
 Medication Instructions:   You have been prescribed a GLP-1 today called Wegovy .  This is a once weekly injection that helps you lose weight and maintain weight loss.  This medication works best when combined with healthy diet and regular exercise.  GLP1 medications help you lose weight by reducing appetite and helping you feel fuller longer. Most common side effects include nausea, vomiting, diarrhea. These side effects can be limited by eating slowly and eating small meals and often improve after a few days. We have Sent a prescription and are awaiting prior authorization from insurance.     Labwork: Your physician recommends that you return for lab work today    Follow-Up: Please follow up in 2-3 months in [facility] with Dr. Raford, Reche Finder, NP or Allean Mink PharmD    Special Instructions:   We have put in a referral to PREP exercise program and the registered dietician

## 2023-09-12 ENCOUNTER — Ambulatory Visit (HOSPITAL_BASED_OUTPATIENT_CLINIC_OR_DEPARTMENT_OTHER): Payer: Self-pay | Admitting: Family

## 2023-09-12 ENCOUNTER — Other Ambulatory Visit (HOSPITAL_COMMUNITY): Payer: Self-pay

## 2023-09-12 ENCOUNTER — Ambulatory Visit (HOSPITAL_BASED_OUTPATIENT_CLINIC_OR_DEPARTMENT_OTHER): Admitting: Internal Medicine

## 2023-09-12 ENCOUNTER — Telehealth: Payer: Self-pay | Admitting: Pharmacy Technician

## 2023-09-12 ENCOUNTER — Other Ambulatory Visit: Payer: Self-pay

## 2023-09-12 LAB — COMPREHENSIVE METABOLIC PANEL WITH GFR
ALT: 13 IU/L (ref 0–44)
AST: 16 IU/L (ref 0–40)
Albumin: 3.7 g/dL — ABNORMAL LOW (ref 4.1–5.1)
Alkaline Phosphatase: 85 IU/L (ref 44–121)
BUN/Creatinine Ratio: 11 (ref 9–20)
BUN: 15 mg/dL (ref 6–20)
Bilirubin Total: 0.2 mg/dL (ref 0.0–1.2)
CO2: 25 mmol/L (ref 20–29)
Calcium: 8.9 mg/dL (ref 8.7–10.2)
Chloride: 101 mmol/L (ref 96–106)
Creatinine, Ser: 1.36 mg/dL — ABNORMAL HIGH (ref 0.76–1.27)
Globulin, Total: 3.3 g/dL (ref 1.5–4.5)
Glucose: 95 mg/dL (ref 70–99)
Potassium: 3.5 mmol/L (ref 3.5–5.2)
Sodium: 142 mmol/L (ref 134–144)
Total Protein: 7 g/dL (ref 6.0–8.5)
eGFR: 70 mL/min/1.73 (ref >59)

## 2023-09-12 LAB — CBC
Hematocrit: 35 % — ABNORMAL LOW (ref 37.5–51.0)
Hemoglobin: 10.9 g/dL — ABNORMAL LOW (ref 13.0–17.7)
MCH: 23.5 pg — ABNORMAL LOW (ref 26.6–33.0)
MCHC: 31.1 g/dL — ABNORMAL LOW (ref 31.5–35.7)
MCV: 75 fL — ABNORMAL LOW (ref 79–97)
Platelets: 277 x10E3/uL (ref 150–450)
RBC: 4.64 x10E6/uL (ref 4.14–5.80)
RDW: 17.4 % — ABNORMAL HIGH (ref 11.6–15.4)
WBC: 6.8 x10E3/uL (ref 3.4–10.8)

## 2023-09-12 LAB — PHOSPHORUS: Phosphorus: 2.6 mg/dL — ABNORMAL LOW (ref 2.8–4.1)

## 2023-09-12 LAB — MAGNESIUM: Magnesium: 1.9 mg/dL (ref 1.6–2.3)

## 2023-09-12 NOTE — Telephone Encounter (Signed)
 Pharmacy Patient Advocate Encounter  Received notification from [facility] that Prior Authorization for wegovy  0.25mg  has been DENIED.  Full denial letter will be uploaded to the media tab. See denial reason below.   PA #/Case ID/Reference #: [id number]

## 2023-09-12 NOTE — Telephone Encounter (Signed)
 Pharmacy Patient Advocate Encounter   Received notification from [employer] that prior authorization for wegovy  0.25mg  is required/requested.   Insurance verification completed.   The patient is insured through [employer] .   Per test claim: PA required; PA submitted to above mentioned insurance via latent Key/confirmation #/EOC [id number] Status is pending

## 2023-09-15 ENCOUNTER — Telehealth: Payer: Self-pay

## 2023-09-15 ENCOUNTER — Other Ambulatory Visit (HOSPITAL_COMMUNITY): Payer: Self-pay

## 2023-09-15 DIAGNOSIS — Z6841 Body Mass Index (BMI) 40.0 and over, adult: Secondary | ICD-10-CM

## 2023-09-15 NOTE — Telephone Encounter (Signed)
 Will trial PA for Zepbound for severe sleep apnea. See updated encounter.   Jared Fana S Melonie Germani, NP

## 2023-09-15 NOTE — Telephone Encounter (Signed)
 Called mz:EMZE program referral; he would like to attend at [facility] on Aug 26, every T/Th at 10am; assessment visit scheduled for Aug 18 at 10am

## 2023-09-15 NOTE — Telephone Encounter (Signed)
 Pharmacy Patient Advocate Encounter   Received notification from CoverMyMeds that prior authorization for Zepbound 2.5MG /0.5ML pen-injectors is required/requested.   Insurance verification completed.   The patient is insured through [facility] .   Per test claim: PA required; PA submitted to above mentioned insurance via CoverMyMeds Key/confirmation #/EOC [id number] Status is pending

## 2023-09-16 ENCOUNTER — Other Ambulatory Visit (HOSPITAL_COMMUNITY): Payer: Self-pay

## 2023-09-16 ENCOUNTER — Other Ambulatory Visit: Payer: Self-pay

## 2023-09-16 NOTE — Telephone Encounter (Signed)
 Pharmacy Patient Advocate Encounter  Received notification from [facility] that Prior Authorization for Zepbound 2.5MG /0.5ML pen-injectors  has been DENIED.  Full denial letter will be uploaded to the media tab. See denial reason below.   PA #/Case ID/Reference #: [id number]

## 2023-09-18 ENCOUNTER — Ambulatory Visit: Attending: Primary Care

## 2023-09-25 ENCOUNTER — Telehealth: Payer: Self-pay

## 2023-09-25 NOTE — Telephone Encounter (Signed)
 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

## 2023-09-26 ENCOUNTER — Encounter (INDEPENDENT_AMBULATORY_CARE_PROVIDER_SITE_OTHER)

## 2023-09-26 DIAGNOSIS — G4733 Obstructive sleep apnea (adult) (pediatric): Secondary | ICD-10-CM | POA: Diagnosis not present

## 2023-09-30 ENCOUNTER — Other Ambulatory Visit: Payer: Self-pay

## 2023-10-06 ENCOUNTER — Other Ambulatory Visit: Payer: Self-pay

## 2023-10-06 ENCOUNTER — Other Ambulatory Visit (HOSPITAL_COMMUNITY): Payer: Self-pay

## 2023-10-08 ENCOUNTER — Other Ambulatory Visit: Payer: Self-pay

## 2023-10-08 NOTE — Telephone Encounter (Signed)
 Tonya from CoverMyMeds requesting a c/b to go over prior serbia

## 2023-10-14 ENCOUNTER — Telehealth (INDEPENDENT_AMBULATORY_CARE_PROVIDER_SITE_OTHER): Payer: Self-pay | Admitting: Primary Care

## 2023-10-14 ENCOUNTER — Ambulatory Visit (INDEPENDENT_AMBULATORY_CARE_PROVIDER_SITE_OTHER)

## 2023-10-14 NOTE — Telephone Encounter (Signed)
 Called pt to reschedule appt. Pt did not answer and LVM

## 2023-10-17 ENCOUNTER — Other Ambulatory Visit: Payer: Self-pay

## 2023-10-20 ENCOUNTER — Other Ambulatory Visit: Payer: Self-pay

## 2023-10-20 NOTE — Progress Notes (Deleted)
 [facility]           OF [place]  History and Physical   Jared Mccoy is a 33 y.o. adult who presents with lymphedema  ***  The pt does *** have hx of previous venous procedures. The patient has *** history of DVT. Pt does *** history of varicose vein.   Pt does *** history of skin changes in lower legs.   There is *** family history of venous disorders.   The patient has *** used compression stockings in the past.    The pt is not on a statin for cholesterol management.  The pt is not on a daily aspirin .   Other AC:  none The pt is on BB, ARB, diuretic for hypertension.   The pt is not on medication for diabetes.   Tobacco hx:  current    Past Medical History:  Diagnosis Date   Hypertension    not on medication   Obese    Peripheral vascular disease (HCC)    edema in legs     Past Surgical History:  Procedure Laterality Date   MANDIBULAR HARDWARE REMOVAL N/A 12/27/2016   Procedure: MANDIBULAR HARDWARE REMOVAL;  Surgeon: Karis Clunes, MD;  Location: [facility] OR;  Service: ENT;  Laterality: N/A;   OPEN REDUCTION INTERNAL FIXATION (ORIF) DISTAL RADIAL FRACTURE Left 11/10/2016   Procedure: OPEN REDUCTION INTERNAL FIXATION (ORIF) DISTAL RADIAL FRACTURE;  Surgeon: Sebastian Lenis, MD;  Location: [facility] OR;  Service: Orthopedics;  Laterality: Left;   ORIF MANDIBULAR FRACTURE Right 11/10/2016   Procedure: OPEN REDUCTION INTERNAL FIXATION (ORIF) MANDIBULAR FRACTURE, Ear Laceration Repair;  Surgeon: Karis Clunes, MD;  Location: [facility] OR;  Service: ENT;  Laterality: Right;    Social History   Socioeconomic History   Marital status: Single    Spouse name: Not on file   Number of children: Not on file   Years of education: Not on file   Highest education level: Not on file  Occupational History   Not on file  Tobacco Use   Smoking status: Every Day    Current packs/day: 0.50    Average packs/day: 0.5 packs/day for 20.0 years (10.0 ttl pk-yrs)    Types:  Cigarettes   Smokeless tobacco: Never   Tobacco comments:    I pack of cigarettes a day. 07/09/2023  Vaping Use   Vaping status: Never Used  Substance and Sexual Activity   Alcohol use: Yes    Comment: 12/26/16- 1 fifth a week   Drug use: No   Sexual activity: Yes  Other Topics Concern   Not on file  Social History Narrative   ** Merged History Encounter **       Social Drivers of Health   Financial Resource Strain: Not on file  Food Insecurity: No Food Insecurity (09/03/2023)   Hunger Vital Sign    Worried About Running Out of Food in the Last Year: Never true    Ran Out of Food in the Last Year: Never true  Transportation Needs: No Transportation Needs (09/03/2023)   PRAPARE - Administrator, Civil Service (Medical): No    Lack of Transportation (Non-Medical): No  Physical Activity: Not on file  Stress: Not on file  Social Connections: Not on file  Intimate Partner Violence: Not At Risk (09/03/2023)   Humiliation, Afraid, Rape, and Kick questionnaire    Fear of Current or Ex-Partner: No    Emotionally Abused: No  Physically Abused: No    Sexually Abused: No    ***No family history on file.  Current Outpatient Medications  Medication Sig Dispense Refill   acetaminophen  (TYLENOL ) 325 MG tablet Take 2 tablets (650 mg total) by mouth every 6 (six) hours as needed for mild pain (pain score 1-3) or fever (or Fever >/= 101). 20 tablet 0   amLODipine  (NORVASC ) 10 MG tablet Take 1 tablet (10 mg total) by mouth daily. 30 tablet 0   carvedilol  (COREG ) 25 MG tablet Take 1 tablet (25 mg total) by mouth 2 (two) times daily with a meal. 60 tablet 0   hydrALAZINE  (APRESOLINE ) 100 MG tablet Take 1 tablet (100 mg total) by mouth every 8 (eight) hours. 90 tablet 0   irbesartan  (AVAPRO ) 150 MG tablet Take 1 tablet (150 mg total) by mouth daily. 30 tablet 0   ondansetron  (ZOFRAN ) 4 MG tablet Take 1 tablet (4 mg total) by mouth every 6 (six) hours as needed for nausea. 20 tablet  0   semaglutide -weight management (WEGOVY ) 0.5 MG/0.5ML SOAJ SQ injection Inject 0.5 mg into the skin once a week for 28 days. 2 mL 0   [START ON 11/08/2023] semaglutide -weight management (WEGOVY ) 1 MG/0.5ML SOAJ SQ injection Inject 1 mg into the skin once a week for 28 days. 2 mL 0   [START ON 12/07/2023] Semaglutide -Weight Management 1.7 MG/0.75ML SOAJ Inject 1.7 mg into the skin once a week for 28 days. 3 mL 0   [START ON 01/05/2024] Semaglutide -Weight Management 2.4 MG/0.75ML SOAJ Inject 2.4 mg into the skin once a week for 28 days. 3 mL 0   spironolactone  (ALDACTONE ) 25 MG tablet Take 1 tablet (25 mg total) by mouth 2 (two) times daily. 60 tablet 0   No current facility-administered medications for this visit.    Allergies  Allergen Reactions   Crab (Diagnostic) Diarrhea and Nausea And Vomiting    Reaction only to crab legs     REVIEW OF SYSTEMS:  *** [X]  denotes positive finding, [ ]  denotes negative finding Cardiac  Comments:  Chest pain or chest pressure:    Shortness of breath upon exertion:    Short of breath when lying flat:    Irregular heart rhythm:        Vascular    Pain in calf, thigh, or hip brought on by ambulation:    Pain in feet at night that wakes you up from your sleep:     Blood clot in your veins:    Leg swelling:  x       Pulmonary    Oxygen at home:    Productive cough:     Wheezing:         Neurologic    Sudden weakness in arms or legs:     Sudden numbness in arms or legs:     Sudden onset of difficulty speaking or slurred speech:    Temporary loss of vision in one eye:     Problems with dizziness:         Gastrointestinal    Blood in stool:     Vomited blood:         Genitourinary    Burning when urinating:     Blood in urine:        Psychiatric    Major depression:         Hematologic    Bleeding problems:    Problems with blood clotting too easily:  Skin    Rashes or ulcers:        Constitutional    Fever or chills:       PHYSICAL EXAMINATION:  ***  General:  WDWN in NAD; vital signs documented above Gait: Not observed HENT: WNL, normocephalic Pulmonary: normal non-labored breathing without wheezing Cardiac: {Desc; regular/irreg:14544} HR; {With/Without:20273} carotid bruit*** Abdomen: soft, NT, aortic pulse is *** palpable Skin: {With/Without:20273} rashes Vascular Exam/Pulses:  Right Left  Radial {Exam; arterial pulse strength 0-4:30167} {Exam; arterial pulse strength 0-4:30167}  DP {Exam; arterial pulse strength 0-4:30167} {Exam; arterial pulse strength 0-4:30167}  PT {Exam; arterial pulse strength 0-4:30167} {Exam; arterial pulse strength 0-4:30167}   Extremities: ***  Neurologic: A&O X 3;  moving all extremities equally Psychiatric:  The pt has {Desc; normal/abnormal:11317::Normal} affect.    Jared Mccoy is a 33 y.o. adult who presents with: ***    -pt has *** pedal pulses -he was unable to get a venous reflux study due to weight limit of the bed.  He did have a DVT study in July 2025 that was negative for DVT in BLE.   -discussed with pt about wearing *** high *** mmHg compression stockings and pt was measured for these today.   *** -discussed the importance of leg elevation and how to elevate properly - pt is advised to elevate their legs and a diagram is given to them to demonstrate for pt to lay flat on their back with knees elevated and slightly bent with their feet higher than their knees, which puts their feet higher than their heart for 15 minutes per day.  If pt cannot lay flat, advised to lay as flat as possible.  -pt is advised to continue as much walking as possible and avoid sitting or standing for long periods of time.  -discussed importance of weight loss and exercise and that water aerobics would also be beneficial.  -handout with recommendations given -pt will f/u ***   Lucie Apt, [facility] [phone]  Clinic MD:  Gretta

## 2023-10-21 ENCOUNTER — Ambulatory Visit: Attending: Primary Care

## 2023-10-24 ENCOUNTER — Emergency Department (HOSPITAL_COMMUNITY)

## 2023-10-24 ENCOUNTER — Other Ambulatory Visit: Payer: Self-pay

## 2023-10-24 ENCOUNTER — Emergency Department (HOSPITAL_COMMUNITY)
Admission: EM | Admit: 2023-10-24 | Discharge: 2023-10-25 | Disposition: A | Discharge status: Home / Self Care | Attending: Emergency Medicine | Admitting: Emergency Medicine

## 2023-10-24 DIAGNOSIS — S91105A Unspecified open wound of left lesser toe(s) without damage to nail, initial encounter: Secondary | ICD-10-CM | POA: Diagnosis not present

## 2023-10-24 DIAGNOSIS — I4891 Unspecified atrial fibrillation: Secondary | ICD-10-CM | POA: Diagnosis not present

## 2023-10-24 DIAGNOSIS — S81812A Laceration without foreign body, left lower leg, initial encounter: Secondary | ICD-10-CM | POA: Insufficient documentation

## 2023-10-24 DIAGNOSIS — S81811A Laceration without foreign body, right lower leg, initial encounter: Secondary | ICD-10-CM | POA: Diagnosis not present

## 2023-10-24 DIAGNOSIS — W19XXXA Unspecified fall, initial encounter: Secondary | ICD-10-CM | POA: Diagnosis not present

## 2023-10-24 DIAGNOSIS — M546 Pain in thoracic spine: Secondary | ICD-10-CM | POA: Diagnosis not present

## 2023-10-24 DIAGNOSIS — S3991XA Unspecified injury of abdomen, initial encounter: Secondary | ICD-10-CM | POA: Insufficient documentation

## 2023-10-24 DIAGNOSIS — W138XXA Fall from, out of or through other building or structure, initial encounter: Secondary | ICD-10-CM | POA: Insufficient documentation

## 2023-10-24 DIAGNOSIS — S8991XA Unspecified injury of right lower leg, initial encounter: Secondary | ICD-10-CM | POA: Diagnosis present

## 2023-10-24 DIAGNOSIS — I10 Essential (primary) hypertension: Secondary | ICD-10-CM | POA: Insufficient documentation

## 2023-10-24 DIAGNOSIS — Z79899 Other long term (current) drug therapy: Secondary | ICD-10-CM | POA: Diagnosis not present

## 2023-10-24 DIAGNOSIS — R609 Edema, unspecified: Secondary | ICD-10-CM | POA: Diagnosis not present

## 2023-10-24 DIAGNOSIS — M79674 Pain in right toe(s): Secondary | ICD-10-CM | POA: Diagnosis not present

## 2023-10-24 DIAGNOSIS — T148XXA Other injury of unspecified body region, initial encounter: Secondary | ICD-10-CM

## 2023-10-24 LAB — I-STAT CHEM 8, ED
BUN: 19 mg/dL (ref 6–20)
Calcium, Ion: 1.14 mmol/L — ABNORMAL LOW (ref 1.15–1.40)
Chloride: 102 mmol/L (ref 98–111)
Creatinine, Ser: 1.4 mg/dL — ABNORMAL HIGH (ref 0.61–1.24)
Glucose, Bld: 85 mg/dL (ref 70–99)
HCT: 36 % — ABNORMAL LOW (ref 39.0–52.0)
Hemoglobin: 12.2 g/dL — ABNORMAL LOW (ref 13.0–17.0)
Potassium: 3.2 mmol/L — ABNORMAL LOW (ref 3.5–5.1)
Sodium: 142 mmol/L (ref 135–145)
TCO2: 28 mmol/L (ref 22–32)

## 2023-10-24 MED ORDER — IOHEXOL 300 MG/ML  SOLN
100.0000 mL | Freq: Once | INTRAMUSCULAR | Status: AC | PRN
  Administered 2023-10-24: 100 mL via INTRAVENOUS

## 2023-10-24 MED ORDER — POTASSIUM CHLORIDE CRYS ER 20 MEQ PO TBCR
40.0000 meq | EXTENDED_RELEASE_TABLET | Freq: Once | ORAL | Status: AC
  Administered 2023-10-24: 40 meq via ORAL
  Filled 2023-10-24: qty 2

## 2023-10-24 MED ORDER — OXYCODONE-ACETAMINOPHEN 5-325 MG PO TABS
1.0000 | ORAL_TABLET | Freq: Once | ORAL | Status: AC
Start: 2023-10-24 — End: 2023-10-24
  Administered 2023-10-24: 1 via ORAL
  Filled 2023-10-24: qty 1

## 2023-10-24 NOTE — Discharge Instructions (Signed)
 Thank you for letting us  evaluate you today.  Your imaging did not show any fractures.  We have given you pain medicine here Emergency Department for pain.  Your potassium was mildly low so we gave you a potassium supplement here.  Your blood pressure was also mildly elevated which may be secondary to pain.  Please follow-up with PCP regarding reobtaining lab work for potassium, recheck of blood pressure  I have also provided you with wound care follow-up.  Please follow-up with them regarding continued treatment of the wounds to the back of legs as we were unable to bring them back together.  You also had an abnormal finding of extensive lymph node enlargement in your abdomen and pelvis; please call your PCP on Monday to discuss this with them and inform them that you need a contrasted CT scan of your chest as well as further evaluation for this finding.  Return to Emergency Department for expanding significant worsening symptoms, red hot, draining wounds to indicate severe infection. Please take the prescribed antibiotic to prevent infection.

## 2023-10-24 NOTE — Telephone Encounter (Signed)
 Checked with pharmacy team.  Unfortunately no alternative covered by his insurance.  Recommend referral to healthy [facility].  I have placed a referral if you will make him aware.  Best,  Reche GORMAN Finder, NP

## 2023-10-24 NOTE — Addendum Note (Signed)
 Addended by: Andreah Goheen S on: 10/24/2023 10:37 AM   Modules accepted: Orders

## 2023-10-24 NOTE — ED Provider Notes (Signed)
  Physical Exam  BP (!) 169/118   Pulse 76   Temp 98.6 F (37 C)   Resp 18   SpO2 97%   Physical Exam  Procedures  Procedures  ED Course / MDM    Medical Decision Making Amount and/or Complexity of Data Reviewed Radiology: ordered.  Risk OTC drugs. Prescription drug management.    Care of this patient assumed from preceding ED provider Tinnie Matter, PA-C at time of shift change.  Please see associated note for further insight of the patient's ED course.  In brief, patient fell through a porch today, has some skin injury to the posterior calves bilaterally not amenable to repair per preceding ED provider.  Alternative changes pending completion of his imaging and likely discharge home.  Remainder patient's imaging is negative for traumatic injury though there was incidental finding of extensive lymphadenopathy in the abdomen and pelvis.  Recommendation was for further evaluation with contrasted CT of the chest About any risk.  Patient symptoms, fevers, chills, unexplained weight loss.  Discussed this finding with the patient and recommendation for further imaging.  Patient with strong preference to be discharged home at this time with plan to call his PCP on Monday to schedule outpatient imaging, due to concern for prolonged wait given patient has already received single contrast bolus and would need to clear that prior to administration of contrast in the ED tonight.  I feel this is reasonable, patient follows up regularly with his PCP and is amenable to this plan.  Regarding his acute injuries tonight, his wounds were irrigated and dressed with bacitracin  in the emergency department.  Given his extensive medical history he was initiated on prophylactic antibiotics to hopefully prevent infection from developing in the skin avulsions to his posterior calves bilaterally.  Clinical concern for emergent underlying condition at this time that would warrant further ED workup or inpatient  management this evening is exceedingly low.  Jared Mccoy voiced understanding of his medical evaluation and treatment plan. Each of their questions answered to their expressed satisfaction.  Return precautions were given.  Patient is well-appearing, stable, and was discharged in good condition.  This chart was dictated using voice recognition software, Dragon. Despite the best efforts of this provider to proofread and correct errors, errors may still occur which can change documentation meaning.       Jared Pleasant SAUNDERS, PA-C 10/25/23 0013    Dean Clarity, MD 10/27/23 1555

## 2023-10-24 NOTE — Telephone Encounter (Signed)
 Yeah, unfortunately with the Medicaid cuts, they no longer cover these medications.  Officially as of Oct 1, but I think they're denying all new prescriptions, since taking for 1 month is pointless.  His best bet is to get in with [facility].   With his size it's going to be a combination of a lot of things to get his weight down, and they are best to work with him on that.

## 2023-10-24 NOTE — Telephone Encounter (Signed)
 Left voicemail for the patient that his insurance will not cover the medication and due to no other alternatives we will refer him to healthy weight and wellness.  The referral has been placed and he should receive a call to get this scheduled.

## 2023-10-24 NOTE — Telephone Encounter (Signed)
 Do you mind reviewing? BMI >70, A1c 06/2023 of 5.5. Really would like get him on GLP1 but I can't think of any other alternatives.   Hayley Horn S Tamrah Victorino, NP

## 2023-10-24 NOTE — ED Triage Notes (Addendum)
 Patient BIB EMS c/o fall. Per report he fell in a wooden porch. Patient landed on his bottom. Patient denies LOC. Patient sustained a avulsion on his left lower leg and  laceration on bilateral legs. Bleeding controlled. Hx DM, Lymphedema.

## 2023-10-24 NOTE — ED Provider Notes (Signed)
 [place] EMERGENCY DEPARTMENT AT [facility] Provider Note   CSN: [id number] Arrival date & time: 10/24/23  1943     Patient presents with: Laceration and Fall   Jared Mccoy is a 33 y.o. adult presents emergency department for evaluation of HTN, OSA, A-fib, morbid obesity, bilateral peripheral edema presents emergency department via EMS for evaluation of bilateral lower leg avulsions, toe pain following falling through a wooden porch.  He landed on his legs and then his legs buckled and he landed on his buttocks.  Approximate height of fall was 2 feet and ports came up to his mid thigh.  He had to get a hammer to break the boards to get out of the porch.  Last Tdap 2021.  Denies head injury, LOC, complaints prior to injury     Laceration Fall       Prior to Admission medications   Medication Sig Start Date End Date Taking? Authorizing Provider  acetaminophen  (TYLENOL ) 325 MG tablet Take 2 tablets (650 mg total) by mouth every 6 (six) hours as needed for mild pain (pain score 1-3) or fever (or Fever >/= 101). 09/04/23   Sheikh, Omair Latif, DO  amLODipine  (NORVASC ) 10 MG tablet Take 1 tablet (10 mg total) by mouth daily. 09/11/23   Walker, Caitlin S, NP  carvedilol  (COREG ) 25 MG tablet Take 1 tablet (25 mg total) by mouth 2 (two) times daily with a meal. 09/11/23   Walker, Caitlin S, NP  hydrALAZINE  (APRESOLINE ) 100 MG tablet Take 1 tablet (100 mg total) by mouth every 8 (eight) hours. 09/11/23   Walker, Caitlin S, NP  irbesartan  (AVAPRO ) 150 MG tablet Take 1 tablet (150 mg total) by mouth daily. 09/11/23   Walker, Caitlin S, NP  ondansetron  (ZOFRAN ) 4 MG tablet Take 1 tablet (4 mg total) by mouth every 6 (six) hours as needed for nausea. 09/04/23   Sherrill Cable Latif, DO  semaglutide -weight management (WEGOVY ) 0.5 MG/0.5ML SOAJ SQ injection Inject 0.5 mg into the skin once a week for 28 days. 10/10/23 11/07/23  Vannie Reche RAMAN, NP  semaglutide -weight management (WEGOVY ) 1  MG/0.5ML SOAJ SQ injection Inject 1 mg into the skin once a week for 28 days. 11/08/23 12/06/23  Vannie Reche RAMAN, NP  Semaglutide -Weight Management 1.7 MG/0.75ML SOAJ Inject 1.7 mg into the skin once a week for 28 days. 12/07/23 01/04/24  Vannie Reche RAMAN, NP  Semaglutide -Weight Management 2.4 MG/0.75ML SOAJ Inject 2.4 mg into the skin once a week for 28 days. 01/05/24 02/02/24  Vannie Reche RAMAN, NP  spironolactone  (ALDACTONE ) 25 MG tablet Take 1 tablet (25 mg total) by mouth 2 (two) times daily. 09/11/23   Vannie Reche RAMAN, NP    Allergies: Crab (diagnostic)    Review of Systems  Skin:  Positive for wound.    Updated Vital Signs BP (!) 169/118   Pulse 76   Temp 98.6 F (37 C)   Resp 18   SpO2 97%   Physical Exam Vitals and nursing note reviewed.  Constitutional:      General: He is not in acute distress.    Appearance: Normal appearance.  HENT:     Head: Normocephalic and atraumatic.  Eyes:     Conjunctiva/sclera: Conjunctivae normal.  Cardiovascular:     Rate and Rhythm: Normal rate.     Pulses:          Dorsalis pedis pulses are 1+ on the right side and 1+ on the left side.  Pulmonary:  Effort: Pulmonary effort is normal. No respiratory distress.     Breath sounds: Normal breath sounds.  Musculoskeletal:     Right lower leg: Edema present.     Left lower leg: Edema present.     Comments: Physical exam is limited secondary to body habitus.  No obvious tenderness to pelvis, thighs, tib/fib, ankles bilaterally.  Does have bony tenderness to left 2nd and 3rd digit of foot  Skin:    Coloration: Skin is not jaundiced or pale.  Neurological:     Mental Status: He is alert and oriented to person, place, and time. Mental status is at baseline.    Posterior LLE   Posterior RLE   (all labs ordered are listed, but only abnormal results are displayed) Labs Reviewed  I-STAT CHEM 8, ED - Abnormal; Notable for the following components:      Result Value   Potassium  3.2 (*)    Creatinine, Ser 1.40 (*)    Calcium, Ion 1.14 (*)    Hemoglobin 12.2 (*)    HCT 36.0 (*)    All other components within normal limits    EKG: None  Radiology: DG Toe 2nd Right Result Date: 10/24/2023 CLINICAL DATA:  Abnormal foot x-ray EXAM: RIGHT SECOND TOE COMPARISON:  None Available. FINDINGS: There is no evidence of fracture or dislocation. There is no evidence of arthropathy or other focal bone abnormality. Soft tissues are unremarkable. IMPRESSION: Negative. Electronically Signed   By: Luke Bun M.D.   On: 10/24/2023 22:22   DG Femur Portable Min 2 Views Left Result Date: 10/24/2023 CLINICAL DATA:  ttp.  Fall EXAM: LEFT FEMUR PORTABLE 2 VIEWS COMPARISON:  None Available. FINDINGS: There is no evidence of fracture or other focal bone lesions. Soft tissues are unremarkable. IMPRESSION: Negative for acute traumatic injury. Electronically Signed   By: Morgane  Naveau M.D.   On: 10/24/2023 21:32   DG Femur Portable Min 2 Views Right Result Date: 10/24/2023 CLINICAL DATA:  ttp EXAM: RIGHT FEMUR PORTABLE 2 VIEW COMPARISON:  None Available. FINDINGS: There is no evidence of fracture or other focal bone lesions. Soft tissues are unremarkable. IMPRESSION: Negative for acute traumatic injury. Electronically Signed   By: Morgane  Naveau M.D.   On: 10/24/2023 21:31   DG Tibia/Fibula Right Result Date: 10/24/2023 CLINICAL DATA:  Fall EXAM: RIGHT TIBIA AND FIBULA - 2 VIEW COMPARISON:  X-ray right tibia fibula 08/09/2022 FINDINGS: There is no evidence of fracture or other focal bone lesions. Persistent diffuse subcutaneus soft tissue edema. IMPRESSION: No acute displaced fracture or dislocation. Electronically Signed   By: Morgane  Naveau M.D.   On: 10/24/2023 21:31   DG Pelvis Portable Result Date: 10/24/2023 CLINICAL DATA:  Fall EXAM: PORTABLE PELVIS 1-2 VIEWS COMPARISON:  None Available. FINDINGS: Limited evaluation due to overlapping osseous structures and overlying soft tissues.  There is no evidence of pelvic fracture or diastasis. No acute displaced fracture or dislocation of either hips. No pelvic bone lesions are seen. IMPRESSION: Negative for acute traumatic injury. Electronically Signed   By: Morgane  Naveau M.D.   On: 10/24/2023 21:26   DG Foot Complete Right Result Date: 10/24/2023 CLINICAL DATA:  ttp of 2nd and 3rd digits EXAM: RIGHT FOOT COMPLETE - 3+ VIEW COMPARISON:  None Available. FINDINGS: Question dislocation of the second digit distal interphalangeal joint- limited evaluation due to overlapping osseous structures and overlying soft tissues. No acute displaced fracture there is no evidence of arthropathy or other focal bone abnormality. Subcutaneus soft tissue edema. IMPRESSION:  Question dislocation of the second digit distal interphalangeal joint- limited evaluation due to overlapping osseous structures and overlying soft tissues. Consider dedicated radiographs of the second digit. Electronically Signed   By: Morgane  Naveau M.D.   On: 10/24/2023 21:19   DG Tibia/Fibula Left Result Date: 10/24/2023 CLINICAL DATA:  Pain.  Fall. EXAM: LEFT TIBIA AND FIBULA - 2 VIEW COMPARISON:  None Available. FINDINGS: There is no evidence of fracture or other focal bone lesions. Soft tissues are unremarkable. IMPRESSION: Negative. Electronically Signed   By: Greig Pique M.D.   On: 10/24/2023 21:17     Medications Ordered in the ED  potassium chloride  SA (KLOR-CON  M) CR tablet 40 mEq (has no administration in time range)  oxyCODONE -acetaminophen  (PERCOCET/ROXICET) 5-325 MG per tablet 1 tablet (1 tablet Oral Given 10/24/23 2025)  iohexol  (OMNIPAQUE ) 300 MG/ML solution 100 mL (100 mLs Intravenous Contrast Given 10/24/23 2155)                                    Medical Decision Making Amount and/or Complexity of Data Reviewed Radiology: ordered.  Risk Prescription drug management.   Patient presents to the ED for concern of bilateral avulsion wounds, toe pain, this  involves an extensive number of treatment options, and is a complaint that carries with it a high risk of complications and morbidity.  The differential diagnosis includes fracture, contusion, dislocation, infection   Co morbidities that complicate the patient evaluation  Morbid obesity See HPI   Additional history obtained:  Additional history obtained from Nursing   External records from outside source obtained and reviewed including triage note   Lab Tests:  I Ordered, and personally interpreted labs.  The pertinent results include:   Potassium 3.2 Creatinine 1.40 Hgb 12.2   Imaging Studies ordered:  I ordered imaging studies including CT abdomen pelvis with contrast, L-spine, thoracic spine, bilateral femur x-ray, right foot x-ray, pelvis x-ray, bilateral tib-fib x-rays I independently visualized and interpreted imaging which showed  Question dislocation of the second digit distal interphalangeal joint- limited evaluation due to overlapping osseous structures and overlying soft tissues CT imaging reads pending at sign out  I agree with the radiologist interpretation    Medicines ordered and prescription drug management:  I ordered medication including Percocet for pain Reevaluation of the patient after these medicines showed that the patient improved I have reviewed the patients home medicines and have made adjustments as needed    Problem List / ED Course:  Fall Avulsion to posterior calves bilaterally Denies head injury, LOC, complaints prior to fall.  Sounds mechanical in nature as porch broke from underneath of him and he had no complaints prior Sustained to avulsions to posterior calves bilaterally.  No active drainage nor bleeding. Unable to reapproximate due to size Physical exam is limited due to body habitus.  No obvious bony tenderness to pelvis, femurs, tib/fib, ankles bilaterally but will still obtain imaging as he did fall with reports it was about a  3 foot fall down to ensure no fractures.  Due to limited physical exam, did order lower extremity x-rays although he is not specifically reporting pain there as I am unable to palpate the bony anatomy to fully check for fractures.   Ordered CT abdomen pelvis for mechanism, better view of pelvis and L-spine and difficulty with physical exam Provided Percocet in ED for pain   Reevaluation:  After the interventions noted above,  I reevaluated the patient and found that they have :improved   Social Determinants of Health:  Tobacco abuse   Dispostion:  Signed out to [employer] PA pending CT imaging formal reads.  As long as there is no acute traumatic injury requiring emergent or acute intervention, feel that patient will likely be discharged to home with symptomatic treatment.  Provided wound care follow-up as unable to reapproximate 2 wounds on posterior legs  Final diagnoses:  Acute midline thoracic back pain  Pain in right toe(s)  Fall, initial encounter    ED Discharge Orders     None        Minnie Tinnie BRAVO, PA 10/24/23 2225    Dean Clarity, MD 10/27/23 1555

## 2023-10-24 NOTE — Telephone Encounter (Signed)
 Jared Mccoy with [employer] called in about denial for both  Wegovy  and Zepbound, she asked are there any alternatives the provider wanted to try? Please advise.

## 2023-10-25 ENCOUNTER — Emergency Department

## 2023-10-25 ENCOUNTER — Inpatient Hospital Stay
Admission: EM | Admit: 2023-10-25 | Discharge: 2023-10-30 | DRG: 871 | Disposition: A | Discharge status: Home Health | Attending: Internal Medicine | Admitting: Internal Medicine

## 2023-10-25 ENCOUNTER — Encounter: Payer: Self-pay | Admitting: Emergency Medicine

## 2023-10-25 DIAGNOSIS — R053 Chronic cough: Secondary | ICD-10-CM | POA: Diagnosis not present

## 2023-10-25 DIAGNOSIS — A408 Other streptococcal sepsis: Principal | ICD-10-CM | POA: Diagnosis present

## 2023-10-25 DIAGNOSIS — N189 Chronic kidney disease, unspecified: Secondary | ICD-10-CM | POA: Diagnosis not present

## 2023-10-25 DIAGNOSIS — Z7985 Long-term (current) use of injectable non-insulin antidiabetic drugs: Secondary | ICD-10-CM

## 2023-10-25 DIAGNOSIS — Z7982 Long term (current) use of aspirin: Secondary | ICD-10-CM | POA: Diagnosis not present

## 2023-10-25 DIAGNOSIS — R5381 Other malaise: Secondary | ICD-10-CM | POA: Diagnosis not present

## 2023-10-25 DIAGNOSIS — I129 Hypertensive chronic kidney disease with stage 1 through stage 4 chronic kidney disease, or unspecified chronic kidney disease: Secondary | ICD-10-CM | POA: Diagnosis not present

## 2023-10-25 DIAGNOSIS — B955 Unspecified streptococcus as the cause of diseases classified elsewhere: Secondary | ICD-10-CM

## 2023-10-25 DIAGNOSIS — Z1152 Encounter for screening for COVID-19: Secondary | ICD-10-CM | POA: Diagnosis not present

## 2023-10-25 DIAGNOSIS — D631 Anemia in chronic kidney disease: Secondary | ICD-10-CM | POA: Diagnosis present

## 2023-10-25 DIAGNOSIS — R079 Chest pain, unspecified: Secondary | ICD-10-CM | POA: Diagnosis not present

## 2023-10-25 DIAGNOSIS — I38 Endocarditis, valve unspecified: Secondary | ICD-10-CM | POA: Diagnosis not present

## 2023-10-25 DIAGNOSIS — R7881 Bacteremia: Secondary | ICD-10-CM | POA: Diagnosis not present

## 2023-10-25 DIAGNOSIS — R519 Headache, unspecified: Secondary | ICD-10-CM | POA: Diagnosis present

## 2023-10-25 DIAGNOSIS — L03116 Cellulitis of left lower limb: Principal | ICD-10-CM | POA: Diagnosis present

## 2023-10-25 DIAGNOSIS — I739 Peripheral vascular disease, unspecified: Secondary | ICD-10-CM | POA: Diagnosis present

## 2023-10-25 DIAGNOSIS — B957 Other staphylococcus as the cause of diseases classified elsewhere: Secondary | ICD-10-CM | POA: Diagnosis not present

## 2023-10-25 DIAGNOSIS — Z6841 Body Mass Index (BMI) 40.0 and over, adult: Secondary | ICD-10-CM | POA: Diagnosis not present

## 2023-10-25 DIAGNOSIS — A419 Sepsis, unspecified organism: Secondary | ICD-10-CM | POA: Diagnosis present

## 2023-10-25 DIAGNOSIS — W138XXA Fall from, out of or through other building or structure, initial encounter: Secondary | ICD-10-CM | POA: Diagnosis present

## 2023-10-25 DIAGNOSIS — E559 Vitamin D deficiency, unspecified: Secondary | ICD-10-CM | POA: Diagnosis not present

## 2023-10-25 DIAGNOSIS — R509 Fever, unspecified: Secondary | ICD-10-CM | POA: Diagnosis not present

## 2023-10-25 DIAGNOSIS — I480 Paroxysmal atrial fibrillation: Secondary | ICD-10-CM | POA: Diagnosis present

## 2023-10-25 DIAGNOSIS — G4733 Obstructive sleep apnea (adult) (pediatric): Secondary | ICD-10-CM | POA: Diagnosis not present

## 2023-10-25 DIAGNOSIS — N179 Acute kidney failure, unspecified: Secondary | ICD-10-CM | POA: Diagnosis present

## 2023-10-25 DIAGNOSIS — J9601 Acute respiratory failure with hypoxia: Secondary | ICD-10-CM | POA: Diagnosis present

## 2023-10-25 DIAGNOSIS — I160 Hypertensive urgency: Secondary | ICD-10-CM | POA: Diagnosis present

## 2023-10-25 DIAGNOSIS — F17210 Nicotine dependence, cigarettes, uncomplicated: Secondary | ICD-10-CM | POA: Diagnosis not present

## 2023-10-25 DIAGNOSIS — Z91013 Allergy to seafood: Secondary | ICD-10-CM

## 2023-10-25 DIAGNOSIS — I890 Lymphedema, not elsewhere classified: Secondary | ICD-10-CM | POA: Diagnosis not present

## 2023-10-25 DIAGNOSIS — H5713 Ocular pain, bilateral: Secondary | ICD-10-CM | POA: Diagnosis present

## 2023-10-25 DIAGNOSIS — E611 Iron deficiency: Secondary | ICD-10-CM | POA: Diagnosis present

## 2023-10-25 DIAGNOSIS — Y92008 Other place in unspecified non-institutional (private) residence as the place of occurrence of the external cause: Secondary | ICD-10-CM

## 2023-10-25 DIAGNOSIS — S81802A Unspecified open wound, left lower leg, initial encounter: Secondary | ICD-10-CM | POA: Diagnosis not present

## 2023-10-25 DIAGNOSIS — B954 Other streptococcus as the cause of diseases classified elsewhere: Secondary | ICD-10-CM | POA: Diagnosis not present

## 2023-10-25 DIAGNOSIS — I10 Essential (primary) hypertension: Secondary | ICD-10-CM | POA: Diagnosis not present

## 2023-10-25 DIAGNOSIS — Z79899 Other long term (current) drug therapy: Secondary | ICD-10-CM | POA: Diagnosis not present

## 2023-10-25 DIAGNOSIS — L0390 Cellulitis, unspecified: Principal | ICD-10-CM

## 2023-10-25 DIAGNOSIS — S81801A Unspecified open wound, right lower leg, initial encounter: Secondary | ICD-10-CM | POA: Diagnosis not present

## 2023-10-25 DIAGNOSIS — S81811A Laceration without foreign body, right lower leg, initial encounter: Secondary | ICD-10-CM | POA: Diagnosis not present

## 2023-10-25 LAB — CBC WITH DIFFERENTIAL/PLATELET
Abs Immature Granulocytes: 0.05 K/uL (ref 0.00–0.07)
Basophils Absolute: 0 K/uL (ref 0.0–0.1)
Basophils Relative: 0 %
Eosinophils Absolute: 0.1 K/uL (ref 0.0–0.5)
Eosinophils Relative: 1 %
HCT: 36.8 % — ABNORMAL LOW (ref 39.0–52.0)
Hemoglobin: 10.9 g/dL — ABNORMAL LOW (ref 13.0–17.0)
Immature Granulocytes: 1 %
Lymphocytes Relative: 7 %
Lymphs Abs: 0.7 K/uL (ref 0.7–4.0)
MCH: 23.2 pg — ABNORMAL LOW (ref 26.0–34.0)
MCHC: 29.6 g/dL — ABNORMAL LOW (ref 30.0–36.0)
MCV: 78.3 fL — ABNORMAL LOW (ref 80.0–100.0)
Monocytes Absolute: 0.6 K/uL (ref 0.1–1.0)
Monocytes Relative: 6 %
Neutro Abs: 9.5 K/uL — ABNORMAL HIGH (ref 1.7–7.7)
Neutrophils Relative %: 85 %
Platelets: 212 K/uL (ref 150–400)
RBC: 4.7 MIL/uL (ref 4.22–5.81)
RDW: 18.9 % — ABNORMAL HIGH (ref 11.5–15.5)
WBC: 11 K/uL — ABNORMAL HIGH (ref 4.0–10.5)
nRBC: 0 % (ref 0.0–0.2)

## 2023-10-25 LAB — URINALYSIS, W/ REFLEX TO CULTURE (INFECTION SUSPECTED)
Bacteria, UA: NONE SEEN
Bilirubin Urine: NEGATIVE
Glucose, UA: NEGATIVE mg/dL
Hgb urine dipstick: NEGATIVE
Ketones, ur: NEGATIVE mg/dL
Leukocytes,Ua: NEGATIVE
Nitrite: NEGATIVE
Protein, ur: 100 mg/dL — AB
Specific Gravity, Urine: 1.021 (ref 1.005–1.030)
pH: 7 (ref 5.0–8.0)

## 2023-10-25 LAB — COMPREHENSIVE METABOLIC PANEL WITH GFR
ALT: 15 U/L (ref 0–44)
AST: 20 U/L (ref 15–41)
Albumin: 3.2 g/dL — ABNORMAL LOW (ref 3.5–5.0)
Alkaline Phosphatase: 63 U/L (ref 38–126)
Anion gap: 9 (ref 5–15)
BUN: 16 mg/dL (ref 6–20)
CO2: 29 mmol/L (ref 22–32)
Calcium: 8.4 mg/dL — ABNORMAL LOW (ref 8.9–10.3)
Chloride: 99 mmol/L (ref 98–111)
Creatinine, Ser: 1.36 mg/dL — ABNORMAL HIGH (ref 0.61–1.24)
GFR, Estimated: >60 mL/min (ref >60)
Glucose, Bld: 88 mg/dL (ref 70–99)
Potassium: 3.1 mmol/L — ABNORMAL LOW (ref 3.5–5.1)
Sodium: 137 mmol/L (ref 135–145)
Total Bilirubin: 0.8 mg/dL (ref 0.0–1.2)
Total Protein: 7.5 g/dL (ref 6.5–8.1)

## 2023-10-25 LAB — RESP PANEL BY RT-PCR (RSV, FLU A&B, COVID)  RVPGX2
Influenza A by PCR: NEGATIVE
Influenza B by PCR: NEGATIVE
Resp Syncytial Virus by PCR: NEGATIVE
SARS Coronavirus 2 by RT PCR: NEGATIVE

## 2023-10-25 LAB — LACTIC ACID, PLASMA: Lactic Acid, Venous: 1.5 mmol/L (ref 0.5–1.9)

## 2023-10-25 MED ORDER — ENOXAPARIN SODIUM 120 MG/0.8ML IJ SOSY
0.5000 mg/kg | PREFILLED_SYRINGE | INTRAMUSCULAR | Status: DC
  Administered 2023-10-25: 117 mg via SUBCUTANEOUS
  Filled 2023-10-25 (×2): qty 0.78

## 2023-10-25 MED ORDER — ACETAMINOPHEN 325 MG PO TABS
650.0000 mg | ORAL_TABLET | Freq: Once | ORAL | Status: AC
  Administered 2023-10-25: 650 mg via ORAL
  Filled 2023-10-25: qty 2

## 2023-10-25 MED ORDER — CARVEDILOL 25 MG PO TABS
25.0000 mg | ORAL_TABLET | Freq: Two times a day (BID) | ORAL | Status: DC
  Administered 2023-10-25 – 2023-10-30 (×10): 25 mg via ORAL
  Filled 2023-10-25: qty 1
  Filled 2023-10-25: qty 4
  Filled 2023-10-25 (×3): qty 1
  Filled 2023-10-25: qty 4
  Filled 2023-10-25: qty 1
  Filled 2023-10-25: qty 4
  Filled 2023-10-25 (×2): qty 1

## 2023-10-25 MED ORDER — ONDANSETRON HCL 4 MG PO TABS: 4.0000 mg | ORAL_TABLET | Freq: Four times a day (QID) | ORAL | Status: DC | PRN

## 2023-10-25 MED ORDER — SPIRONOLACTONE 25 MG PO TABS
25.0000 mg | ORAL_TABLET | Freq: Two times a day (BID) | ORAL | Status: DC
  Administered 2023-10-26 (×2): 25 mg via ORAL
  Filled 2023-10-25 (×3): qty 1

## 2023-10-25 MED ORDER — SODIUM CHLORIDE 0.9 % IV SOLN
2.0000 g | Freq: Once | INTRAVENOUS | Status: AC
  Administered 2023-10-25: 2 g via INTRAVENOUS
  Filled 2023-10-25: qty 12.5

## 2023-10-25 MED ORDER — LACTATED RINGERS IV SOLN
150.0000 mL/h | INTRAVENOUS | Status: DC
  Administered 2023-10-26: 150 mL/h via INTRAVENOUS

## 2023-10-25 MED ORDER — CEPHALEXIN 500 MG PO CAPS: 500.0000 mg | ORAL_CAPSULE | Freq: Two times a day (BID) | ORAL | 0 refills | Status: DC

## 2023-10-25 MED ORDER — SODIUM CHLORIDE 0.9 % IV SOLN
2.0000 g | Freq: Three times a day (TID) | INTRAVENOUS | Status: DC
  Administered 2023-10-26: 2 g via INTRAVENOUS
  Filled 2023-10-25: qty 12.5

## 2023-10-25 MED ORDER — KETOROLAC TROMETHAMINE 30 MG/ML IJ SOLN
30.0000 mg | Freq: Four times a day (QID) | INTRAMUSCULAR | Status: DC | PRN
  Administered 2023-10-25: 30 mg via INTRAVENOUS
  Filled 2023-10-25: qty 1

## 2023-10-25 MED ORDER — VANCOMYCIN HCL IN DEXTROSE 1-5 GM/200ML-% IV SOLN
1000.0000 mg | Freq: Once | INTRAVENOUS | Status: DC
Start: 2023-10-25 — End: 2023-10-25

## 2023-10-25 MED ORDER — ONDANSETRON HCL 4 MG/2ML IJ SOLN: 4.0000 mg | Freq: Four times a day (QID) | INTRAMUSCULAR | Status: DC | PRN

## 2023-10-25 MED ORDER — MORPHINE SULFATE (PF) 4 MG/ML IV SOLN
4.0000 mg | Freq: Once | INTRAVENOUS | Status: AC
  Administered 2023-10-25: 4 mg via INTRAVENOUS
  Filled 2023-10-25: qty 1

## 2023-10-25 MED ORDER — VANCOMYCIN HCL 1500 MG/300ML IV SOLN
1500.0000 mg | Freq: Once | INTRAVENOUS | Status: AC
Start: 2023-10-25 — End: 2023-10-26
  Administered 2023-10-25: 1500 mg via INTRAVENOUS
  Filled 2023-10-25: qty 300

## 2023-10-25 MED ORDER — AMLODIPINE BESYLATE 10 MG PO TABS
10.0000 mg | ORAL_TABLET | Freq: Every day | ORAL | Status: DC
  Administered 2023-10-25 – 2023-10-30 (×6): 10 mg via ORAL
  Filled 2023-10-25 (×2): qty 1
  Filled 2023-10-25 (×2): qty 2
  Filled 2023-10-25: qty 1
  Filled 2023-10-25: qty 2

## 2023-10-25 MED ORDER — HYDRALAZINE HCL 50 MG PO TABS
100.0000 mg | ORAL_TABLET | Freq: Three times a day (TID) | ORAL | Status: DC
  Administered 2023-10-25 – 2023-10-30 (×15): 100 mg via ORAL
  Filled 2023-10-25 (×15): qty 2

## 2023-10-25 MED ORDER — ACETAMINOPHEN 325 MG PO TABS
650.0000 mg | ORAL_TABLET | Freq: Four times a day (QID) | ORAL | Status: DC | PRN
  Administered 2023-10-26 (×2): 650 mg via ORAL
  Filled 2023-10-25 (×2): qty 2

## 2023-10-25 MED ORDER — IRBESARTAN 150 MG PO TABS
150.0000 mg | ORAL_TABLET | Freq: Every day | ORAL | Status: DC
Start: 2023-10-25 — End: 2023-10-27
  Administered 2023-10-25 – 2023-10-26 (×2): 150 mg via ORAL
  Filled 2023-10-25 (×3): qty 1

## 2023-10-25 MED ORDER — VANCOMYCIN HCL 2000 MG/400ML IV SOLN
2000.0000 mg | Freq: Two times a day (BID) | INTRAVENOUS | Status: DC
  Filled 2023-10-25: qty 400

## 2023-10-25 MED ORDER — ACETAMINOPHEN 650 MG RE SUPP: 650.0000 mg | Freq: Four times a day (QID) | RECTAL | Status: DC | PRN

## 2023-10-25 MED ORDER — LACTATED RINGERS IV BOLUS
1000.0000 mL | Freq: Once | INTRAVENOUS | Status: AC
  Administered 2023-10-25: 1000 mL via INTRAVENOUS

## 2023-10-25 MED ORDER — VANCOMYCIN HCL IN DEXTROSE 1-5 GM/200ML-% IV SOLN
1000.0000 mg | Freq: Once | INTRAVENOUS | Status: AC
  Administered 2023-10-25: 1000 mg via INTRAVENOUS
  Filled 2023-10-25: qty 200

## 2023-10-25 MED ORDER — HYDROCODONE-ACETAMINOPHEN 5-325 MG PO TABS
1.0000 | ORAL_TABLET | ORAL | Status: DC | PRN
  Administered 2023-10-26: 1 via ORAL
  Filled 2023-10-25: qty 1
  Filled 2023-10-25: qty 2

## 2023-10-25 MED ORDER — BACITRACIN ZINC 500 UNIT/GM EX OINT
TOPICAL_OINTMENT | Freq: Two times a day (BID) | CUTANEOUS | Status: DC
  Filled 2023-10-25: qty 3.6

## 2023-10-25 NOTE — Progress Notes (Signed)
 Pharmacy Antibiotic Note  Jared Mccoy is a 33 y.o. adult admitted on 10/25/2023 with cellulitis.  Pharmacy has been consulted for Vancomycin , Cefepime  dosing.  Plan: Cefepime  2 gm IV X 1 given in ED on 9/13 @ 2048. Cefepime  2 gm IV Q8H ordered to start on 9/14 @ 0500.   Vancomycin  1 gm IV X 1 given in ED on 9/13 @ 2148.  Additional Vanc 1500 mg IV X 1 ordered to make total loading dose of 2500 mg.  Vancomycin  2 gm IV Q12H ordered to start on 9/14 @ 2200.  AUC = 465.4 Vanc trough = 14.0   Height: 6' (182.9 cm) Weight: (!) 234 kg (515 lb 14 oz) IBW/kg (Calculated) : 77.6  Temp (24hrs), Avg:101.4 F (38.6 C), Min:99.7 F (37.6 C), Max:103 F (39.4 C)  Recent Labs  Lab 10/24/23 2112 10/25/23 2021 10/25/23 2024  WBC  --  11.0*  --   CREATININE 1.40* 1.36*  --   LATICACIDVEN  --   --  1.5    Estimated Creatinine Clearance (by C-G formula based on SCr of 1.36 mg/dL (H)) Male: 874.6 mL/min (A) Male: 150.3 mL/min (A)    Allergies  Allergen Reactions   Crab (Diagnostic) Diarrhea and Nausea And Vomiting    Reaction only to crab legs     Antimicrobials this admission:   >>    >>   Dose adjustments this admission:   Microbiology results:  BCx:   UCx:    Sputum:    MRSA PCR:   Thank you for allowing pharmacy to be a part of this patient's care.  Aveen Stansel D 10/25/2023 11:13 PM

## 2023-10-25 NOTE — H&P (Signed)
 History and Physical    Patient: Jared Mccoy FMW:[id number] DOB: May 13, 1990 DOA: 10/25/2023 DOS: the patient was seen and examined on 10/25/2023 PCP: Celestia Rosaline SQUIBB, NP  Patient coming from: Home  Chief Complaint: fever and chills  HPI: Jared Mccoy is a 33 y.o. adult with medical history significant for Morbid obesity with suspected OSA, paroxysmal A-fib not on [facility], HTN, chronic lower extremity edema (noncardiac), for which he was hospitalized in July open (7/22 to 7/24), and started on diuretics and Unna boots, with being admitted with sepsis secondary to left lower extremity cellulitis possibly related to a wound on his left leg that he sustained when he fell through a porch the day prior.   On the day of arrival, he awoke with bodyaches and chills. In the ED Tmax 103 and tachycardic to 106, hypertensive with BP up to 211/112.Labs with WBC 11,000 and lactic acid 1.5.  Creatinine at baseline at 1.36, potassium 3.1, hemoglobin at baseline at 10.9. EKG showed sinus rhythm at 97 Left tib-fib showing soft tissue edema without gas or bony abnormality. Patient unable to get CT due to his weight Patient started on vancomycin  and cefepime . Admission requested.     Review of Systems: As mentioned in the history of present illness. All other systems reviewed and are negative.  Past Medical History:  Diagnosis Date   Hypertension    not on medication   Obese    Peripheral vascular disease (HCC)    edema in legs    Past Surgical History:  Procedure Laterality Date   MANDIBULAR HARDWARE REMOVAL N/A 12/27/2016   Procedure: MANDIBULAR HARDWARE REMOVAL;  Surgeon: Karis Clunes, MD;  Location: [facility] OR;  Service: ENT;  Laterality: N/A;   OPEN REDUCTION INTERNAL FIXATION (ORIF) DISTAL RADIAL FRACTURE Left 11/10/2016   Procedure: OPEN REDUCTION INTERNAL FIXATION (ORIF) DISTAL RADIAL FRACTURE;  Surgeon: Sebastian Lenis, MD;  Location: [facility] OR;  Service: Orthopedics;  Laterality: Left;   ORIF  MANDIBULAR FRACTURE Right 11/10/2016   Procedure: OPEN REDUCTION INTERNAL FIXATION (ORIF) MANDIBULAR FRACTURE, Ear Laceration Repair;  Surgeon: Karis Clunes, MD;  Location: [facility] OR;  Service: ENT;  Laterality: Right;   Social History:  reports that he has been smoking cigarettes. He has a 10 pack-year smoking history. He has never used smokeless tobacco. He reports current alcohol use. He reports that he does not use drugs.  Allergies  Allergen Reactions   Crab (Diagnostic) Diarrhea and Nausea And Vomiting    Reaction only to crab legs     History reviewed. No pertinent family history.  Prior to Admission medications   Medication Sig Start Date End Date Taking? Authorizing Provider  acetaminophen  (TYLENOL ) 325 MG tablet Take 2 tablets (650 mg total) by mouth every 6 (six) hours as needed for mild pain (pain score 1-3) or fever (or Fever >/= 101). 09/04/23   Sheikh, Omair Latif, DO  amLODipine  (NORVASC ) 10 MG tablet Take 1 tablet (10 mg total) by mouth daily. 09/11/23   Walker, Caitlin S, NP  carvedilol  (COREG ) 25 MG tablet Take 1 tablet (25 mg total) by mouth 2 (two) times daily with a meal. 09/11/23   Vannie Reche RAMAN, NP  cephALEXin  (KEFLEX ) 500 MG capsule Take 1 capsule (500 mg total) by mouth 2 (two) times daily for 5 days. 10/25/23 10/30/23  Sponseller, Pleasant SAUNDERS, PA-C  hydrALAZINE  (APRESOLINE ) 100 MG tablet Take 1 tablet (100 mg total) by mouth every 8 (eight) hours. 09/11/23   Walker, Caitlin S, NP  irbesartan  (AVAPRO )  150 MG tablet Take 1 tablet (150 mg total) by mouth daily. 09/11/23   Walker, Caitlin S, NP  ondansetron  (ZOFRAN ) 4 MG tablet Take 1 tablet (4 mg total) by mouth every 6 (six) hours as needed for nausea. 09/04/23   Sherrill Cable Latif, DO  semaglutide -weight management (WEGOVY ) 0.5 MG/0.5ML SOAJ SQ injection Inject 0.5 mg into the skin once a week for 28 days. 10/10/23 11/07/23  Vannie Reche RAMAN, NP  semaglutide -weight management (WEGOVY ) 1 MG/0.5ML SOAJ SQ injection Inject 1 mg into  the skin once a week for 28 days. 11/08/23 12/06/23  Vannie Reche RAMAN, NP  Semaglutide -Weight Management 1.7 MG/0.75ML SOAJ Inject 1.7 mg into the skin once a week for 28 days. 12/07/23 01/04/24  Vannie Reche RAMAN, NP  Semaglutide -Weight Management 2.4 MG/0.75ML SOAJ Inject 2.4 mg into the skin once a week for 28 days. 01/05/24 02/02/24  Vannie Reche RAMAN, NP  spironolactone  (ALDACTONE ) 25 MG tablet Take 1 tablet (25 mg total) by mouth 2 (two) times daily. 09/11/23   Vannie Reche RAMAN, NP    Physical Exam: Vitals:   10/25/23 2030 10/25/23 2031 10/25/23 2100 10/25/23 2200  BP: (!) 191/102  (!) 206/110 (!) 211/112  Pulse: 94  90   Resp:      Temp:  99.7 F (37.6 C)    TempSrc:      SpO2: 94%  97% 97%  Weight:      Height:       Physical Exam  Labs on Admission: I have personally reviewed following labs and imaging studies  CBC: Recent Labs  Lab 10/24/23 2112 10/25/23 2021  WBC  --  11.0*  NEUTROABS  --  9.5*  HGB 12.2* 10.9*  HCT 36.0* 36.8*  MCV  --  78.3*  PLT  --  212   Basic Metabolic Panel: Recent Labs  Lab 10/24/23 2112 10/25/23 2021  NA 142 137  K 3.2* 3.1*  CL 102 99  CO2  --  29  GLUCOSE 85 88  BUN 19 16  CREATININE 1.40* 1.36*  CALCIUM  --  8.4*   GFR: Estimated Creatinine Clearance (by C-G formula based on SCr of 1.36 mg/dL (H)) Male: 874.6 mL/min (A) Male: 150.3 mL/min (A) Liver Function Tests: Recent Labs  Lab 10/25/23 2021  AST 20  ALT 15  ALKPHOS 63  BILITOT 0.8  PROT 7.5  ALBUMIN 3.2*   No results for input(s): LIPASE, AMYLASE in the last 168 hours. No results for input(s): AMMONIA in the last 168 hours. Coagulation Profile: No results for input(s): INR, PROTIME in the last 168 hours. Cardiac Enzymes: No results for input(s): CKTOTAL, CKMB, CKMBINDEX, TROPONINI in the last 168 hours. BNP (last 3 results) No results for input(s): PROBNP in the last 8760 hours. HbA1C: No results for input(s): HGBA1C in the  last 72 hours. CBG: No results for input(s): GLUCAP in the last 168 hours. Lipid Profile: No results for input(s): CHOL, HDL, LDLCALC, TRIG, CHOLHDL, LDLDIRECT in the last 72 hours. Thyroid  Function Tests: No results for input(s): TSH, T4TOTAL, FREET4, T3FREE, THYROIDAB in the last 72 hours. Anemia Panel: No results for input(s): VITAMINB12, FOLATE, FERRITIN, TIBC, IRON, RETICCTPCT in the last 72 hours. Urine analysis:    Component Value Date/Time   COLORURINE YELLOW (A) 10/25/2023 2200   APPEARANCEUR HAZY (A) 10/25/2023 2200   LABSPEC 1.021 10/25/2023 2200   PHURINE 7.0 10/25/2023 2200   GLUCOSEU NEGATIVE 10/25/2023 2200   HGBUR NEGATIVE 10/25/2023 2200   BILIRUBINUR NEGATIVE 10/25/2023 2200  KETONESUR NEGATIVE 10/25/2023 2200   PROTEINUR 100 (A) 10/25/2023 2200   UROBILINOGEN 1.0 10/15/2008 1535   NITRITE NEGATIVE 10/25/2023 2200   LEUKOCYTESUR NEGATIVE 10/25/2023 2200    Radiological Exams on Admission: DG Tibia/Fibula Left Result Date: 10/25/2023 CLINICAL DATA:  Possible necrotizing fasciitis EXAM: LEFT TIBIA AND FIBULA - 2 VIEW COMPARISON:  None Available. FINDINGS: Considerable soft tissue swelling is noted in the left lower leg. No underlying bony abnormality is seen. IMPRESSION: Soft tissue edema similar to that seen on physical exam. No underlying bony abnormality is noted. Electronically Signed   By: Oneil Devonshire M.D.   On: 10/25/2023 21:32   DG Chest Port 1 View Result Date: 10/25/2023 CLINICAL DATA:  Questionable sepsis. EXAM: PORTABLE CHEST 1 VIEW COMPARISON:  Chest radiograph dated 09/03/2023. FINDINGS: Cardiomegaly with vascular congestion. No focal consolidation, pleural effusion or pneumothorax. No acute osseous pathology. IMPRESSION: Cardiomegaly with vascular congestion. No focal consolidation. Electronically Signed   By: Vanetta Chou M.D.   On: 10/25/2023 20:36   CT L-SPINE NO CHARGE Result Date: 10/24/2023 CLINICAL DATA:   Back trauma, no prior imaging (Age >= 16y) EXAM: CT Thoracic and Lumbar spine with contrast TECHNIQUE: Multiplanar CT images of the thoracic and lumbar spine were reconstructed from contemporary CT of the Chest, Abdomen, and Pelvis. RADIATION DOSE REDUCTION: This exam was performed according to the departmental dose-optimization program which includes automated exposure control, adjustment of the mA and/or kV according to patient size and/or use of iterative reconstruction technique. CONTRAST:  No additional COMPARISON:  CT chest abdomen pelvis 11/10/2016 FINDINGS: CT THORACIC SPINE FINDINGS Alignment: Normal. Vertebrae: No acute fracture or focal pathologic process. Paraspinal and other soft tissues: Negative. Disc levels: Maintained. CT LUMBAR SPINE FINDINGS Segmentation: 5 lumbar type vertebrae. Alignment: Normal. Vertebrae: No acute fracture or focal pathologic process. Paraspinal and other soft tissues: Negative. Disc levels: Maintained. Other: Limited sensitivity for the detection of hilar adenopathy on this noncontrast study. Please see separately dictated CT abdomen pelvis 10/24/2023. Chronic asymmetrically enlarged left greater than right psoas muscle. No surrounding fat stranding. IMPRESSION: 1. No acute displaced fracture or traumatic listhesis of the thoracolumbar spine. 2. Limited sensitivity for the detection of hilar adenopathy on this noncontrast study. Given CT abdomen pelvis findings, recommend CT chest with intravenous contrast for further evaluation. Electronically Signed   By: Morgane  Naveau M.D.   On: 10/24/2023 22:41   CT Thoracic Spine Wo Contrast Result Date: 10/24/2023 CLINICAL DATA:  Back trauma, no prior imaging (Age >= 16y) EXAM: CT Thoracic and Lumbar spine with contrast TECHNIQUE: Multiplanar CT images of the thoracic and lumbar spine were reconstructed from contemporary CT of the Chest, Abdomen, and Pelvis. RADIATION DOSE REDUCTION: This exam was performed according to the  departmental dose-optimization program which includes automated exposure control, adjustment of the mA and/or kV according to patient size and/or use of iterative reconstruction technique. CONTRAST:  No additional COMPARISON:  CT chest abdomen pelvis 11/10/2016 FINDINGS: CT THORACIC SPINE FINDINGS Alignment: Normal. Vertebrae: No acute fracture or focal pathologic process. Paraspinal and other soft tissues: Negative. Disc levels: Maintained. CT LUMBAR SPINE FINDINGS Segmentation: 5 lumbar type vertebrae. Alignment: Normal. Vertebrae: No acute fracture or focal pathologic process. Paraspinal and other soft tissues: Negative. Disc levels: Maintained. Other: Limited sensitivity for the detection of hilar adenopathy on this noncontrast study. Please see separately dictated CT abdomen pelvis 10/24/2023. Chronic asymmetrically enlarged left greater than right psoas muscle. No surrounding fat stranding. IMPRESSION: 1. No acute displaced fracture or traumatic listhesis of  the thoracolumbar spine. 2. Limited sensitivity for the detection of hilar adenopathy on this noncontrast study. Given CT abdomen pelvis findings, recommend CT chest with intravenous contrast for further evaluation. Electronically Signed   By: Morgane  Naveau M.D.   On: 10/24/2023 22:41   CT ABDOMEN PELVIS W CONTRAST Result Date: 10/24/2023 CLINICAL DATA:  Abdominal trauma, blunt.  Fall EXAM: CT ABDOMEN AND PELVIS WITH CONTRAST TECHNIQUE: Multidetector CT imaging of the abdomen and pelvis was performed using the standard protocol following bolus administration of intravenous contrast. RADIATION DOSE REDUCTION: This exam was performed according to the departmental dose-optimization program which includes automated exposure control, adjustment of the mA and/or kV according to patient size and/or use of iterative reconstruction technique. CONTRAST:  OMNIPAQUE  IOHEXOL  300 MG/ML  SOLN COMPARISON:  None Available. FINDINGS: Lower chest: No acute  abnormality. Hepatobiliary: Not enlarged. No focal lesion. No laceration or subcapsular hematoma. The gallbladder is otherwise unremarkable with no radio-opaque gallstones. No biliary ductal dilatation. Pancreas: Normal pancreatic contour. No main pancreatic duct dilatation. Spleen: Not enlarged. No focal lesion. No laceration, subcapsular hematoma, or vascular injury. Adrenals/Urinary Tract: No nodularity bilaterally. Bilateral kidneys enhance symmetrically. No hydronephrosis. No contusion, laceration, or subcapsular hematoma. No injury to the vascular structures or collecting systems. No hydroureter. The urinary bladder is unremarkable. Stomach/Bowel: No small or large bowel wall thickening or dilatation. The appendix is unremarkable. Vasculature/Lymphatics: No abdominal aorta or iliac aneurysm. No active contrast extravasation or pseudoaneurysm. Multiple enlarged bilateral inguinal, bilateral pelvic sidewall, and retroperitoneal lymph nodes. As an example a 2.1 cm left inguinal lymph node (6:108 close), a 1.5 cm left pelvic sidewall lymph node (6:81), and a 1.9 cm retroperitoneal lymph node (6:44). Reproductive: Prostate is unremarkable. Other: No simple free fluid ascites. No pneumoperitoneum. No hemoperitoneum. No mesenteric hematoma identified. No organized fluid collection. Musculoskeletal: No significant soft tissue hematoma. No acute pelvic fracture. Please see separately dictated CT thoracolumbar spine. Other ports and devices: None. IMPRESSION: 1. Multiple enlarged bilateral inguinal, bilateral pelvic sidewall, and retroperitoneal lymph nodes. Finding concerning for lymphoproliferative disorder versus less likely metastases. Recommend further evaluation with CT chest with intravenous contrast to evaluate for lymph nodes. 2. No acute intra-abdominal, intrapelvic traumatic injury. 3. Please see separately dictated CT thoracolumbar spine. Electronically Signed   By: Morgane  Naveau M.D.   On: 10/24/2023  22:34   DG Toe 2nd Right Result Date: 10/24/2023 CLINICAL DATA:  Abnormal foot x-ray EXAM: RIGHT SECOND TOE COMPARISON:  None Available. FINDINGS: There is no evidence of fracture or dislocation. There is no evidence of arthropathy or other focal bone abnormality. Soft tissues are unremarkable. IMPRESSION: Negative. Electronically Signed   By: Luke Bun M.D.   On: 10/24/2023 22:22   DG Femur Portable Min 2 Views Left Result Date: 10/24/2023 CLINICAL DATA:  ttp.  Fall EXAM: LEFT FEMUR PORTABLE 2 VIEWS COMPARISON:  None Available. FINDINGS: There is no evidence of fracture or other focal bone lesions. Soft tissues are unremarkable. IMPRESSION: Negative for acute traumatic injury. Electronically Signed   By: Morgane  Naveau M.D.   On: 10/24/2023 21:32   DG Femur Portable Min 2 Views Right Result Date: 10/24/2023 CLINICAL DATA:  ttp EXAM: RIGHT FEMUR PORTABLE 2 VIEW COMPARISON:  None Available. FINDINGS: There is no evidence of fracture or other focal bone lesions. Soft tissues are unremarkable. IMPRESSION: Negative for acute traumatic injury. Electronically Signed   By: Morgane  Naveau M.D.   On: 10/24/2023 21:31   DG Tibia/Fibula Right Result Date: 10/24/2023 CLINICAL DATA:  Fall EXAM: RIGHT TIBIA AND FIBULA - 2 VIEW COMPARISON:  X-ray right tibia fibula 08/09/2022 FINDINGS: There is no evidence of fracture or other focal bone lesions. Persistent diffuse subcutaneus soft tissue edema. IMPRESSION: No acute displaced fracture or dislocation. Electronically Signed   By: Morgane  Naveau M.D.   On: 10/24/2023 21:31   DG Pelvis Portable Result Date: 10/24/2023 CLINICAL DATA:  Fall EXAM: PORTABLE PELVIS 1-2 VIEWS COMPARISON:  None Available. FINDINGS: Limited evaluation due to overlapping osseous structures and overlying soft tissues. There is no evidence of pelvic fracture or diastasis. No acute displaced fracture or dislocation of either hips. No pelvic bone lesions are seen. IMPRESSION: Negative for  acute traumatic injury. Electronically Signed   By: Morgane  Naveau M.D.   On: 10/24/2023 21:26   DG Foot Complete Right Result Date: 10/24/2023 CLINICAL DATA:  ttp of 2nd and 3rd digits EXAM: RIGHT FOOT COMPLETE - 3+ VIEW COMPARISON:  None Available. FINDINGS: Question dislocation of the second digit distal interphalangeal joint- limited evaluation due to overlapping osseous structures and overlying soft tissues. No acute displaced fracture there is no evidence of arthropathy or other focal bone abnormality. Subcutaneus soft tissue edema. IMPRESSION: Question dislocation of the second digit distal interphalangeal joint- limited evaluation due to overlapping osseous structures and overlying soft tissues. Consider dedicated radiographs of the second digit. Electronically Signed   By: Morgane  Naveau M.D.   On: 10/24/2023 21:19   DG Tibia/Fibula Left Result Date: 10/24/2023 CLINICAL DATA:  Pain.  Fall. EXAM: LEFT TIBIA AND FIBULA - 2 VIEW COMPARISON:  None Available. FINDINGS: There is no evidence of fracture or other focal bone lesions. Soft tissues are unremarkable. IMPRESSION: Negative. Electronically Signed   By: Greig Pique M.D.   On: 10/24/2023 21:17   Data Reviewed for HPI: Relevant notes from primary care and specialist visits, past discharge summaries as available in EHR, including Care Everywhere. Prior diagnostic testing as pertinent to current admission diagnoses Updated medications and problem lists for reconciliation ED course, including vitals, labs, imaging, treatment and response to treatment Triage notes, nursing and pharmacy notes and ED provider's notes Notable results as noted above in HPI      Assessment and Plan: * Cellulitis of left lower extremity Sepsis Left lower extremity wound, traumatic Chronic lymphedema/venous stasis Sepsis criteria include fever and tachycardia with leukocytosis Sepsis fluids Continue vancomycin  and cefepime  Follow blood cultures Pain  control Keep leg elevated  Hypertensive urgency Continue home amlodipine , carvedilol , hydralazine , irbesartan  and spironolactone   Morbid obesity with BMI of 60.0-69.9, adult (HCC) Complicating factor to overall prognosis and care  Paroxysmal atrial fibrillation (HCC) Not currently on anticoagulation Will start aspirin     DVT prophylaxis: Lovenox   Consults: none  Advance Care Planning:   Code Status: Prior   Family Communication: none  Disposition Plan: Back to previous home environment  Severity of Illness: The appropriate patient status for this patient is INPATIENT. Inpatient status is judged to be reasonable and necessary in order to provide the required intensity of service to ensure the patient's safety. The patient's presenting symptoms, physical exam findings, and initial radiographic and laboratory data in the context of their chronic comorbidities is felt to place them at high risk for further clinical deterioration. Furthermore, it is not anticipated that the patient will be medically stable for discharge from the hospital within 2 midnights of admission.   * I certify that at the point of admission it is my clinical judgment that the patient will require inpatient hospital care  spanning beyond 2 midnights from the point of admission due to high intensity of service, high risk for further deterioration and high frequency of surveillance required.*  Author: Delayne LULLA Solian, MD 10/25/2023 10:34 PM  For on call review [email or web address].

## 2023-10-25 NOTE — ED Notes (Signed)
 First nurse: To ED [facility] for sudden onset of chills, body aches, rigors 45 min ago. Was seen at [facility] this AM after he fell through a porch.   Hx a fib (sinus today), HTN, morbid obesity  100.6, HR 85, NSR, 210/160 with hx HTN, 94% RA, RR 30, etco2 in 50s

## 2023-10-25 NOTE — Assessment & Plan Note (Signed)
 Complicating factor to overall prognosis and care

## 2023-10-25 NOTE — Progress Notes (Signed)
 Anticoagulation monitoring(Lovenox ):  33 yo male ordered Lovenox  40 mg Q24h    Filed Weights   10/25/23 1907 10/25/23 1910  Weight: (!) 235.9 kg (520 lb) (!) 234 kg (515 lb 14 oz)   BMI 70   Lab Results  Component Value Date   CREATININE 1.36 (H) 10/25/2023   CREATININE 1.40 (H) 10/24/2023   CREATININE 1.36 (H) 09/11/2023   Estimated Creatinine Clearance (by C-G formula based on SCr of 1.36 mg/dL (H)) Male: 874.6 mL/min (A) Male: 150.3 mL/min (A) Hemoglobin & Hematocrit     Component Value Date/Time   HGB 10.9 (L) 10/25/2023 2021   HGB 10.9 (L) 09/11/2023 1422   HCT 36.8 (L) 10/25/2023 2021   HCT 35.0 (L) 09/11/2023 1422     Per Protocol for Patient with estCrcl > 30 ml/min and BMI > 30, will transition to Lovenox  117 mg Q24h.

## 2023-10-25 NOTE — ED Notes (Signed)
 Pt's wounds were irrigated, Ointment was applied and wounds were bandaged with a 4x4 dressing and roller gauze.

## 2023-10-25 NOTE — ED Provider Notes (Signed)
 [facility] Provider Note    Event Date/Time   First MD Initiated Contact with Patient 10/25/23 1944     (approximate)   History   Chills   HPI  Jared Mccoy is a 33 y.o. adult who presents to the emergency department today because of concerns for chills, tremors, body aches.  The patient states that the symptoms started suddenly this evening.  Patient denies any chest pain or shortness of breath.  Denies any abdominal pain nausea or vomiting.  Was seen yesterday after falling through a porch and suffered wounds to his bilateral legs.  He says that the wound to his left leg has been hurting it significantly since then.     Physical Exam   Triage Vital Signs: ED Triage Vitals  Encounter Vitals Group     BP 10/25/23 1907 (!) 212/96     Girls Systolic BP Percentile --      Girls Diastolic BP Percentile --      Boys Systolic BP Percentile --      Boys Diastolic BP Percentile --      Pulse Rate 10/25/23 1907 (!) 101     Resp 10/25/23 1911 (!) 22     Temp 10/25/23 1911 (!) 103 F (39.4 C)     Temp Source 10/25/23 1911 Oral     SpO2 10/25/23 1911 100 %     Weight 10/25/23 1907 (!) 520 lb (235.9 kg)     Height 10/25/23 1907 6' (1.829 m)     Head Circumference --      Peak Flow --      Pain Score 10/25/23 1909 6     Pain Loc --      Pain Education --      Exclude from Growth Chart --     Most recent vital signs: Vitals:   10/25/23 1907 10/25/23 1911  BP: (!) 212/96   Pulse: (!) 101   Resp:  (!) 22  Temp:  (!) 103 F (39.4 C)  SpO2:  100%   General: Awake, alert, oriented. CV:  Good peripheral perfusion. Tachycardia. Resp:  Normal effort. Lungs clear. Abd:  No distention.  Other:  Significant bilateral lower extremity edema. Wound to left calf with purulent and odorous discharge. Significant tenderness to left calf.    ED Results / Procedures / Treatments   Labs (all labs ordered are listed, but only abnormal results are  displayed) Labs Reviewed  RESP PANEL BY RT-PCR (RSV, FLU A&B, COVID)  [id number]  CULTURE, BLOOD (ROUTINE X 2)  CULTURE, BLOOD (ROUTINE X 2)  LACTIC ACID, PLASMA  LACTIC ACID, PLASMA  COMPREHENSIVE METABOLIC PANEL WITH GFR  CBC WITH DIFFERENTIAL/PLATELET  URINALYSIS, W/ REFLEX TO CULTURE (INFECTION SUSPECTED)     EKG  I, Guadalupe Eagles, attending physician, personally viewed and interpreted this EKG  EKG Time: *** Rate: *** Rhythm: *** Axis: *** Intervals: qtc *** QRS: *** ST changes: *** Impression: ***    RADIOLOGY I independently interpreted and visualized the CXR. My interpretation: No pneumonia Radiology interpretation: ***  I independently interpreted and visualized the CT extremity left lower leg. My interpretation: *** Radiology interpretation: ***  PROCEDURES:  Critical Care performed: Yes  CRITICAL CARE Performed by: Guadalupe Eagles   Total critical care time: *** minutes  Critical care time was exclusive of separately billable procedures and treating other patients.  Critical care was necessary to treat or prevent imminent or life-threatening deterioration.  Critical care was time spent personally  by me on the following activities: development of treatment plan with patient and/or surrogate as well as nursing, discussions with consultants, evaluation of patient's response to treatment, examination of patient, obtaining history from patient or surrogate, ordering and performing treatments and interventions, ordering and review of laboratory studies, ordering and review of radiographic studies, pulse oximetry and re-evaluation of patient's condition.   Procedures    MEDICATIONS ORDERED IN ED: Medications  acetaminophen  (TYLENOL ) tablet 650 mg (650 mg Oral Given 10/25/23 1914)     IMPRESSION / MDM / ASSESSMENT AND PLAN / ED COURSE  I reviewed the triage vital signs and the nursing notes.                              Differential diagnosis  includes, but is not limited to, ***  Patient's presentation is most consistent with {EM COPA:27473}   ***The patient is on the cardiac monitor to evaluate for evidence of arrhythmia and/or significant heart rate changes.  ***      FINAL CLINICAL IMPRESSION(S) / ED DIAGNOSES   Final diagnoses:  None        Rx / DC Orders   ED Discharge Orders     None        Note:  This document was prepared using Dragon voice recognition software and may include unintentional dictation errors.

## 2023-10-25 NOTE — Assessment & Plan Note (Signed)
 Continue home amlodipine , carvedilol , hydralazine , irbesartan  and spironolactone 

## 2023-10-25 NOTE — Assessment & Plan Note (Signed)
 Sepsis Wound bilateral lower extremities Chronic lymphedema/venous stasis Sepsis criteria include fever and tachycardia with leukocytosis Sepsis fluids Continue vancomycin  and cefepime  Follow blood cultures Pain control Keep leg elevated

## 2023-10-25 NOTE — Assessment & Plan Note (Signed)
 Not currently on anticoagulation Will start aspirin 

## 2023-10-25 NOTE — Hospital Course (Signed)
 Jared Mccoy

## 2023-10-25 NOTE — ED Triage Notes (Signed)
 Patient c/o chills, not feeling well, generalized body aches.

## 2023-10-26 ENCOUNTER — Inpatient Hospital Stay

## 2023-10-26 DIAGNOSIS — L03116 Cellulitis of left lower limb: Secondary | ICD-10-CM | POA: Diagnosis not present

## 2023-10-26 LAB — BASIC METABOLIC PANEL WITH GFR
Anion gap: 10 (ref 5–15)
BUN: 19 mg/dL (ref 6–20)
CO2: 26 mmol/L (ref 22–32)
Calcium: 8.1 mg/dL — ABNORMAL LOW (ref 8.9–10.3)
Chloride: 100 mmol/L (ref 98–111)
Creatinine, Ser: 1.73 mg/dL — ABNORMAL HIGH (ref 0.61–1.24)
GFR, Estimated: 52 mL/min — ABNORMAL LOW (ref >60)
Glucose, Bld: 105 mg/dL — ABNORMAL HIGH (ref 70–99)
Potassium: 3.4 mmol/L — ABNORMAL LOW (ref 3.5–5.1)
Sodium: 136 mmol/L (ref 135–145)

## 2023-10-26 LAB — BLOOD CULTURE ID PANEL (REFLEXED) - BCID2

## 2023-10-26 LAB — CBC
HCT: 35.9 % — ABNORMAL LOW (ref 39.0–52.0)
Hemoglobin: 10.8 g/dL — ABNORMAL LOW (ref 13.0–17.0)
MCH: 22.8 pg — ABNORMAL LOW (ref 26.0–34.0)
MCHC: 30.1 g/dL (ref 30.0–36.0)
MCV: 75.9 fL — ABNORMAL LOW (ref 80.0–100.0)
Platelets: 205 K/uL (ref 150–400)
RBC: 4.73 MIL/uL (ref 4.22–5.81)
RDW: 18.9 % — ABNORMAL HIGH (ref 11.5–15.5)
WBC: 18.6 K/uL — ABNORMAL HIGH (ref 4.0–10.5)
nRBC: 0 % (ref 0.0–0.2)

## 2023-10-26 LAB — MAGNESIUM: Magnesium: 1.7 mg/dL (ref 1.7–2.4)

## 2023-10-26 LAB — PROTIME-INR
INR: 1.1 (ref 0.8–1.2)
Prothrombin Time: 15.2 s (ref 11.4–15.2)

## 2023-10-26 LAB — CK: Total CK: 300 U/L (ref 49–397)

## 2023-10-26 LAB — CORTISOL-AM, BLOOD: Cortisol - AM: 22.4 ug/dL (ref 6.7–22.6)

## 2023-10-26 LAB — MRSA NEXT GEN BY PCR, NASAL: MRSA by PCR Next Gen: DETECTED — AB

## 2023-10-26 LAB — VITAMIN D 25 HYDROXY (VIT D DEFICIENCY, FRACTURES): Vit D, 25-Hydroxy: 9.3 ng/mL — ABNORMAL LOW (ref 30–100)

## 2023-10-26 LAB — PHOSPHORUS: Phosphorus: 2.7 mg/dL (ref 2.5–4.6)

## 2023-10-26 MED ORDER — SODIUM CHLORIDE 0.9 % IV SOLN
100.0000 mg | Freq: Two times a day (BID) | INTRAVENOUS | Status: DC
  Administered 2023-10-26 – 2023-10-28 (×4): 100 mg via INTRAVENOUS
  Filled 2023-10-26 (×6): qty 100

## 2023-10-26 MED ORDER — CLOTRIMAZOLE-BETAMETHASONE 1-0.05 % EX CREA
1.0000 | TOPICAL_CREAM | Freq: Two times a day (BID) | CUTANEOUS | Status: DC
  Administered 2023-10-26 – 2023-10-30 (×7): 1 via TOPICAL
  Filled 2023-10-26 (×2): qty 15

## 2023-10-26 MED ORDER — FUROSEMIDE 10 MG/ML IJ SOLN
8.0000 mg/h | INTRAMUSCULAR | Status: DC
  Administered 2023-10-26: 8 mg/h via INTRAVENOUS
  Filled 2023-10-26: qty 20

## 2023-10-26 MED ORDER — SODIUM CHLORIDE 0.9 % IV SOLN
2.0000 g | INTRAVENOUS | Status: DC
  Administered 2023-10-26 – 2023-10-30 (×5): 2 g via INTRAVENOUS
  Filled 2023-10-26 (×5): qty 20

## 2023-10-26 MED ORDER — ENOXAPARIN SODIUM 120 MG/0.8ML IJ SOSY
120.0000 mg | PREFILLED_SYRINGE | INTRAMUSCULAR | Status: DC
  Administered 2023-10-26 – 2023-10-29 (×4): 120 mg via SUBCUTANEOUS
  Filled 2023-10-26 (×5): qty 0.8

## 2023-10-26 MED ORDER — ASPIRIN 81 MG PO TBEC
81.0000 mg | DELAYED_RELEASE_TABLET | Freq: Every day | ORAL | Status: DC
  Administered 2023-10-26 – 2023-10-30 (×5): 81 mg via ORAL
  Filled 2023-10-26 (×5): qty 1

## 2023-10-26 MED ORDER — FE FUM-VIT C-VIT B12-FA 460-60-0.01-1 MG PO CAPS
1.0000 | ORAL_CAPSULE | Freq: Every day | ORAL | Status: DC
  Administered 2023-10-26 – 2023-10-30 (×5): 1 via ORAL
  Filled 2023-10-26 (×5): qty 1

## 2023-10-26 NOTE — Progress Notes (Signed)
 [facility] Progress Note  Patient: Jared Mccoy    FMW:[id number]  DOA: 10/25/2023     Date of Service: the patient was seen and examined on 10/26/2023  No chief complaint on file.  Brief hospital course: BERLEY GAMBRELL is a 33 y.o. adult with medical history significant for Morbid obesity with suspected OSA, paroxysmal A-fib not on AC, HTN, chronic lower extremity edema (noncardiac), for which he was hospitalized in July  (7/22 to 7/24), and started on diuretics and Unna boots, with being admitted with sepsis secondary from suspected lower extremity cellulitis possibly related to a wound on his left leg . On the day of arrival, he awoke with bodyaches and chills. In the ED Tmax 103 and tachycardic to 106, hypertensive with BP up to 211/112.Labs with WBC 11,000 and lactic acid 1.5.  Creatinine at baseline at 1.36, potassium 3.1, hemoglobin at baseline at 10.9. EKG showed sinus rhythm at 97 Left tib-fib showing soft tissue edema without gas or bony abnormality. Patient unable to get CT due to his weight Patient started on vancomycin  and cefepime . Admission requested.   Assessment and Plan:  # Sepsis secondary to Cellulitis of left lower extremity Wound bilateral lower extremities Chronic lymphedema/venous stasis Sepsis criteria include fever and tachycardia with leukocytosis S/p Sepsis fluids, stopped due to worsening of respiratory failure S/p vancomycin  and cefepime  9/14 started ceftriaxone  2 g IV daily blood cultures growing strep, follow surgery report Pain control Keep leg elevated Check MRSA screen if positive then plan is to start doxycycline   # Acute hypoxic respiratory failure secondary to pulmonary vascular congestion, hypoventilation syndrome due to super morbid obesity and OSA  Continue supplemental O2 evaluation gradually wean off Started IV Lasix  infusion  # Chronic bilateral lower extremity lymphedema Started Lasix  IV infusion to diarrhea Check renal  functions and urine output  # AKI, most likely due to Check renal function daily and monitor urine output  Avoided IV fluid due to respiratory failure Started Lasix , renal function will be monitored Check renal sonogram to rule out obstruction Avoid nephrotoxic medication, urinalysis medications   # Hypertensive urgency  Continue home amlodipine , carvedilol , hydralazine , irbesartan  and spironolactone  Monitor BP and titrate medications accordingly    # Paroxysmal atrial fibrillation (HCC) Not currently on anticoagulation started aspirin  81 mg p.o. daily  # Super morbid obesity Body mass index is 69.97 kg/m.  Interventions: Calorie restricted diet and daily exercise advised to lose body weight.  Lifestyle modification discussed.   Diet: Heart healthy diet DVT Prophylaxis: Subcutaneous Lovenox    Advance goals of care discussion: Full code  Family Communication: family was not present at bedside, at the time of interview.  The pt provided permission to discuss medical plan with the family. Opportunity was given to ask question and all questions were answered satisfactorily.   Disposition:  Pt is from Home, admitted with sepsis due to cellulitis, still on IV Abx, which precludes a safe discharge. Discharge to Home, when stable, may need few days to improve.  Subjective: No significant events overnight, patient was complaining of pain in the left lower extremity 6/10, no any other complaints.  Physical Exam: General: NAD, lying comfortably, morbidly obese Appear in no distress, affect appropriate Eyes: PERRLA ENT: Oral Mucosa Clear, moist  Neck: no JVD,  Cardiovascular: S1 and S2 Present, no Murmur,  Respiratory: good respiratory effort, Bilateral Air entry equal and Decreased, no Crackles, no wheezes Abdomen: Bowel Sound present, Soft and no tenderness,  Skin: no rashes Extremities: LLE erythema and  tenderness, chronic lymphedema bilateral LE Neurologic: without any new  focal findings Gait not checked due to patient safety concerns  Vitals:   10/26/23 1000 10/26/23 1100 10/26/23 1115 10/26/23 1200  BP: 138/75 (!) 146/70  130/71  Pulse: 89 87  93  Resp: (!) 35  (!) 30 (!) 30  Temp:    (!) 100.6 F (38.1 C)  TempSrc:    Oral  SpO2: (!) 83% 93%  93%  Weight:      Height:       No intake or output data in the 24 hours ending 10/26/23 1249 Filed Weights   10/25/23 1907 10/25/23 1910  Weight: (!) 235.9 kg (!) 234 kg    Data Reviewed: I have personally reviewed and interpreted daily labs, tele strips, imagings as discussed above. I reviewed all nursing notes, pharmacy notes, vitals, pertinent old records I have discussed plan of care as described above with RN and patient/family.  CBC: Recent Labs  Lab 10/24/23 2112 10/25/23 2021 10/26/23 0916  WBC  --  11.0* 18.6*  NEUTROABS  --  9.5*  --   HGB 12.2* 10.9* 10.8*  HCT 36.0* 36.8* 35.9*  MCV  --  78.3* 75.9*  PLT  --  212 205   Basic Metabolic Panel: Recent Labs  Lab 10/24/23 2112 10/25/23 2021 10/26/23 0916  NA 142 137 136  K 3.2* 3.1* 3.4*  CL 102 99 100  CO2  --  29 26  GLUCOSE 85 88 105*  BUN 19 16 19   CREATININE 1.40* 1.36* 1.73*  CALCIUM  --  8.4* 8.1*  MG  --   --  1.7  PHOS  --   --  2.7    Studies: DG Tibia/Fibula Left Result Date: 10/25/2023 CLINICAL DATA:  Possible necrotizing fasciitis EXAM: LEFT TIBIA AND FIBULA - 2 VIEW COMPARISON:  None Available. FINDINGS: Considerable soft tissue swelling is noted in the left lower leg. No underlying bony abnormality is seen. IMPRESSION: Soft tissue edema similar to that seen on physical exam. No underlying bony abnormality is noted. Electronically Signed   By: Oneil Devonshire M.D.   On: 10/25/2023 21:32   DG Chest Port 1 View Result Date: 10/25/2023 CLINICAL DATA:  Questionable sepsis. EXAM: PORTABLE CHEST 1 VIEW COMPARISON:  Chest radiograph dated 09/03/2023. FINDINGS: Cardiomegaly with vascular congestion. No focal  consolidation, pleural effusion or pneumothorax. No acute osseous pathology. IMPRESSION: Cardiomegaly with vascular congestion. No focal consolidation. Electronically Signed   By: Vanetta Chou M.D.   On: 10/25/2023 20:36    Scheduled Meds:  amLODipine   10 mg Oral Daily   carvedilol   25 mg Oral BID WC   enoxaparin  (LOVENOX ) injection  0.5 mg/kg Subcutaneous Q24H   Fe Fum-Vit C-Vit B12-FA  1 capsule Oral QPC breakfast   hydrALAZINE   100 mg Oral Q8H   irbesartan   150 mg Oral Daily   spironolactone   25 mg Oral BID   Continuous Infusions:  cefTRIAXone  (ROCEPHIN )  IV Stopped (10/26/23 1100)   lactated ringers  150 mL/hr (10/26/23 0842)   PRN Meds: acetaminophen  **OR** acetaminophen , HYDROcodone -acetaminophen , ketorolac , ondansetron  **OR** ondansetron  (ZOFRAN ) IV  Time spent: 35 minutes  Author: ELVAN SOR. MD [facility] 10/26/2023 12:49 PM  To reach On-call, see care teams to locate the attending and reach out to them via [email or web address]. If 7PM-7AM, please contact night-coverage If you still have difficulty reaching the attending provider, please page the [facility] (Director on Call) for [facility] on amion for assistance.

## 2023-10-26 NOTE — ED Notes (Signed)
 Pt vitals reading a low O2. Upon assessment pt c/o feeling SOB. O2 stats readind in the upper 80's with current being 88% on RA. O2 applied. Dr. Von at bedside. O2 currently at 4L and pt sating between 90-93% on 4L.

## 2023-10-26 NOTE — Consult Note (Addendum)
 WOC Nurse Consult Note: patient with known history of lymphedema and venous insufficiency; has been seen by [facility] team on previous admissions for lymphedema and venous ulcers  Reason for Consult: B posterior leg wounds  Wound type: full thickness B posterior legs r/t trauma post fall through a porch; wounds in setting of chronic lymphedema  Pressure Injury POA: NA not pressure  Measurement: see nursing flowsheet  Wound bed: red moist  Drainage (amount, consistency, odor) see nursing flowsheet; weeping per MD notes  Periwound: changes consistent with lymphedema  Dressing procedure/placement/frequency: Cleanse B lower leg wounds with Vashe wound cleanser Soila [phone]) do not rinse and allow to air dry. Apply silver hydrofiber ([place] [phone] Aquacel [employer]) cut to fit wound bed daily, cover with ABD pads and secure with Kerlix roll gauze beginning right above toes and ending right below knees. Apply Ace bandage wrapped in same fashion as Kerlix for light compression.   It is unknown if patient receives ongoing management of lymphedema/venous insufficiency as outpatient. Would recommend follow-up with vascular/wound care center/ or lymphedema clinic at discharge.   POC discussed with bedside nurse. WOC team will not follow. Re-consult if further needs arise.   Thank you,    Powell Bar MSN, RN-BC, [facility]  Lymphedema  Resources (updated 12/2020) Each site requires a referral from your primary care MD [facility] [address] [place], [place]  [phone] (Upper extremities)  [address] [place], [place] [phone] (Lower extremities, PATIENT CAN NOT HAVE A WOUND)  [facility] [address] [place], [place] [address] [phone]  [facility] [address] Medical Office [address]  [place], [place] [phone]  [facility] [address] [place], [place] [address] [phone]  [facility]  (only treatment for lymphedema related to cancer diagnosis) [address]  [place], [place] [address] [phone]    [facility] [address] [place], [place] [address] [phone] [facility] ([facility]) [phone] [address] [place], [place] [address] [phone]

## 2023-10-26 NOTE — ED Notes (Signed)
 Sister Graham, [phone]) updated with patient permission.

## 2023-10-27 ENCOUNTER — Ambulatory Visit: Admitting: Dietician

## 2023-10-27 ENCOUNTER — Inpatient Hospital Stay (HOSPITAL_COMMUNITY)
Admit: 2023-10-27 | Discharge: 2023-10-27 | Disposition: A | Discharge status: Home / Self Care | Attending: Student | Admitting: Student

## 2023-10-27 DIAGNOSIS — I38 Endocarditis, valve unspecified: Secondary | ICD-10-CM | POA: Diagnosis not present

## 2023-10-27 DIAGNOSIS — L03116 Cellulitis of left lower limb: Secondary | ICD-10-CM | POA: Diagnosis not present

## 2023-10-27 LAB — ECHOCARDIOGRAM COMPLETE
AR max vel: 3.68 cm2
AV Area VTI: 3.61 cm2
AV Area mean vel: 3.61 cm2
AV Mean grad: 6 mmHg
AV Peak grad: 12.7 mmHg
Ao pk vel: 1.78 m/s
Calc EF: 82.2 %
Height: 72 in
MV VTI: 4.24 cm2
S' Lateral: 3.4 cm
Single Plane A2C EF: 87 %
Single Plane A4C EF: 76.9 %
Weight: 8254.02 [oz_av]

## 2023-10-27 LAB — MAGNESIUM: Magnesium: 1.8 mg/dL (ref 1.7–2.4)

## 2023-10-27 LAB — BASIC METABOLIC PANEL WITH GFR
Anion gap: 10 (ref 5–15)
BUN: 26 mg/dL — ABNORMAL HIGH (ref 6–20)
CO2: 28 mmol/L (ref 22–32)
Calcium: 7.9 mg/dL — ABNORMAL LOW (ref 8.9–10.3)
Chloride: 97 mmol/L — ABNORMAL LOW (ref 98–111)
Creatinine, Ser: 1.92 mg/dL — ABNORMAL HIGH (ref 0.61–1.24)
GFR, Estimated: 46 mL/min — ABNORMAL LOW (ref >60)
Glucose, Bld: 111 mg/dL — ABNORMAL HIGH (ref 70–99)
Potassium: 3.4 mmol/L — ABNORMAL LOW (ref 3.5–5.1)
Sodium: 135 mmol/L (ref 135–145)

## 2023-10-27 LAB — CBC
HCT: 34.5 % — ABNORMAL LOW (ref 39.0–52.0)
Hemoglobin: 10.4 g/dL — ABNORMAL LOW (ref 13.0–17.0)
MCH: 23.3 pg — ABNORMAL LOW (ref 26.0–34.0)
MCHC: 30.1 g/dL (ref 30.0–36.0)
MCV: 77.4 fL — ABNORMAL LOW (ref 80.0–100.0)
Platelets: 200 K/uL (ref 150–400)
RBC: 4.46 MIL/uL (ref 4.22–5.81)
RDW: 19 % — ABNORMAL HIGH (ref 11.5–15.5)
WBC: 12.5 K/uL — ABNORMAL HIGH (ref 4.0–10.5)
nRBC: 0 % (ref 0.0–0.2)

## 2023-10-27 LAB — PHOSPHORUS: Phosphorus: 3.5 mg/dL (ref 2.5–4.6)

## 2023-10-27 MED ORDER — OXYCODONE HCL 5 MG PO TABS
5.0000 mg | ORAL_TABLET | Freq: Four times a day (QID) | ORAL | Status: DC | PRN
  Administered 2023-10-27 – 2023-10-28 (×3): 10 mg via ORAL
  Filled 2023-10-27 (×3): qty 2

## 2023-10-27 MED ORDER — PERFLUTREN LIPID MICROSPHERE
1.0000 mL | INTRAVENOUS | Status: AC | PRN
  Administered 2023-10-27: 2 mL via INTRAVENOUS

## 2023-10-27 MED ORDER — ACETAMINOPHEN 325 MG PO TABS
650.0000 mg | ORAL_TABLET | Freq: Three times a day (TID) | ORAL | Status: DC
  Administered 2023-10-27 – 2023-10-30 (×10): 650 mg via ORAL
  Filled 2023-10-27 (×10): qty 2

## 2023-10-27 MED ORDER — VITAMIN D (ERGOCALCIFEROL) 1.25 MG (50000 UNIT) PO CAPS
50000.0000 [IU] | ORAL_CAPSULE | ORAL | Status: DC
  Administered 2023-10-27: 50000 [IU] via ORAL
  Filled 2023-10-27: qty 1

## 2023-10-27 MED ORDER — IRBESARTAN 150 MG PO TABS
150.0000 mg | ORAL_TABLET | Freq: Every day | ORAL | Status: DC
  Administered 2023-10-30: 150 mg via ORAL
  Filled 2023-10-27: qty 1

## 2023-10-27 MED ORDER — SPIRONOLACTONE 25 MG PO TABS
25.0000 mg | ORAL_TABLET | Freq: Two times a day (BID) | ORAL | Status: DC
  Administered 2023-10-30: 25 mg via ORAL
  Filled 2023-10-27: qty 1

## 2023-10-27 NOTE — ED Notes (Signed)
 This paramedic has been in the pt's room multiple times due to BIPAP machine alarm going off.  It is noted the shakes his head and when he does this the face mask moves into his mouth   The mask has been tighten and re-adjusted.  RT states they do not have a bigger size mask.  The pt was advised to try and not to move his head and he agreed to same.   Called RT and spoke about the consideration of high flow.

## 2023-10-27 NOTE — Progress Notes (Signed)
 [facility] Progress Note  Patient: Jared Mccoy    FMW:[id number]  DOA: 10/25/2023     Date of Service: the patient was seen and examined on 10/27/2023  No chief complaint on file.  Brief hospital course: CAPONE SCHWINN is a 33 y.o. adult with medical history significant for Morbid obesity with suspected OSA, paroxysmal A-fib not on AC, HTN, chronic lower extremity edema (noncardiac), for which he was hospitalized in July  (7/22 to 7/24), and started on diuretics and Unna boots, with being admitted with sepsis secondary from suspected lower extremity cellulitis possibly related to a wound on his left leg . On the day of arrival, he awoke with bodyaches and chills. In the ED Tmax 103 and tachycardic to 106, hypertensive with BP up to 211/112.Labs with WBC 11,000 and lactic acid 1.5.  Creatinine at baseline at 1.36, potassium 3.1, hemoglobin at baseline at 10.9. EKG showed sinus rhythm at 97 Left tib-fib showing soft tissue edema without gas or bony abnormality. Patient unable to get CT due to his weight Patient started on vancomycin  and cefepime . Admission requested.   Assessment and Plan:  # Sepsis secondary to Cellulitis of left lower extremity Wound bilateral lower extremities Chronic lymphedema/venous stasis Sepsis criteria include fever and tachycardia with leukocytosis S/p Sepsis fluids, stopped due to worsening of respiratory failure S/p vancomycin  and cefepime  9/14 started ceftriaxone  2 g IV daily and doxycycline  blood cultures growing strep, follow sensitivity report Pain control Keep leg elevated MRSA screen is positive   # Acute hypoxic respiratory failure secondary to pulmonary vascular congestion, hypoventilation syndrome due to super morbid obesity and OSA  Continue supplemental O2 evaluation gradually wean off S/p IV Lasix  infusion, DC'd on 9/15 due to renal failure  # Chronic bilateral lower extremity lymphedema Started Lasix  IV infusion to  diarrhea Check renal functions and urine output  # AKI, most likely due to sepsis Check renal function daily and monitor urine output  Avoided IV fluid due to respiratory failure S/p Lasix  given due to respiratory failure and lower extremity edema 9/15 discontinued Lasix  infusion due to worsening of renal functions US  renal: Unremarkable renal ultrasound.  Avoid nephrotoxic medication, urinalysis medications sCr 1.92 slightly elevated 9/15 hold off irbesartan  and Aldactone  for few days Bladder scan shows 148 mL urine, no significant retention    # Hypertensive urgency  Continue home amlodipine , carvedilol , hydralazine  9/15 Held irbesartan  and spironolactone  due to elevated creatinine Monitor BP and titrate medications accordingly    # Paroxysmal atrial fibrillation (HCC) Not currently on anticoagulation started aspirin  81 mg p.o. daily  # Vitamin D  deficiency: started vitamin D  50,000 units p.o. weekly, follow with PCP to repeat vitamin D  level after 3 to 6 months.  # Iron deficiency, Tsat 6%, elevated IV iron due to active infection Started oral iron supplement with vitamin C.  Follow-up with PCP to repeat iron profile after 3 to 6 months  # Folic acid  level 7.6, lower end.  Started Trigel supplement # Vitamin B12 level 365, goal >400, started Trigel oral supplement   # Super morbid obesity Body mass index is 69.97 kg/m.  Interventions: Calorie restricted diet and daily exercise advised to lose body weight.  Lifestyle modification discussed.   Diet: Heart healthy diet DVT Prophylaxis: Subcutaneous Lovenox    Advance goals of care discussion: Full code  Family Communication: family was not present at bedside, at the time of interview.  The pt provided permission to discuss medical plan with the family. Opportunity was given to  ask question and all questions were answered satisfactorily.   Disposition:  Pt is from Home, admitted with sepsis due to cellulitis, still on  IV Abx, which precludes a safe discharge. Discharge to Home, when stable, may need few days to improve.  Subjective: No significant events overnight, patient was complaining of feeling generalized body ache due to lying in the bed.  Patient was advised to move around, PT and OT consult placed.  Lower extremity edema and cellulitis is gradually improving.   Physical Exam: General: NAD, lying comfortably, morbidly obese Appear in no distress, affect appropriate Eyes: PERRLA ENT: Oral Mucosa Clear, moist  Neck: no JVD,  Cardiovascular: S1 and S2 Present, no Murmur,  Respiratory: good respiratory effort, Bilateral Air entry equal and Decreased, no Crackles, no wheezes Abdomen: Bowel Sound present, Soft and no tenderness,  Skin: no rashes Extremities: LLE erythema and tenderness, chronic lymphedema bilateral LE Neurologic: without any new focal findings Gait not checked due to patient safety concerns  Vitals:   10/27/23 0900 10/27/23 1000 10/27/23 1030 10/27/23 1120  BP: 139/81 (!) 99/52 (!) 103/59 111/61  Pulse: 81 76 76 75  Resp: 14   20  Temp:    98.4 F (36.9 C)  TempSrc:      SpO2: (!) 88% (!) 87% (!) 85% 96%  Weight:      Height:        Intake/Output Summary (Last 24 hours) at 10/27/2023 1328 Last data filed at 10/26/2023 2253 Gross per 24 hour  Intake --  Output 600 ml  Net -600 ml   Filed Weights   10/25/23 1907 10/25/23 1910  Weight: (!) 235.9 kg (!) 234 kg    Data Reviewed: I have personally reviewed and interpreted daily labs, tele strips, imagings as discussed above. I reviewed all nursing notes, pharmacy notes, vitals, pertinent old records I have discussed plan of care as described above with RN and patient/family.  CBC: Recent Labs  Lab 10/24/23 2112 10/25/23 2021 10/26/23 0916 10/27/23 0435  WBC  --  11.0* 18.6* 12.5*  NEUTROABS  --  9.5*  --   --   HGB 12.2* 10.9* 10.8* 10.4*  HCT 36.0* 36.8* 35.9* 34.5*  MCV  --  78.3* 75.9* 77.4*  PLT  --  212  205 200   Basic Metabolic Panel: Recent Labs  Lab 10/24/23 2112 10/25/23 2021 10/26/23 0916 10/27/23 0435  NA 142 137 136 135  K 3.2* 3.1* 3.4* 3.4*  CL 102 99 100 97*  CO2  --  29 26 28   GLUCOSE 85 88 105* 111*  BUN 19 16 19  26*  CREATININE 1.40* 1.36* 1.73* 1.92*  CALCIUM  --  8.4* 8.1* 7.9*  MG  --   --  1.7 1.8  PHOS  --   --  2.7 3.5    Studies: US  RENAL Result Date: 10/26/2023 CLINICAL DATA:  Acute renal insufficiency. EXAM: RENAL / URINARY TRACT ULTRASOUND COMPLETE COMPARISON:  CT abdomen pelvis dated 10/24/2023. FINDINGS: Evaluation is limited due to body habitus. Right Kidney: Renal measurements: 13.2 x 6.3 x 6.4 cm = volume: 275 mL. Normal echogenicity. No hydronephrosis or shadowing stone. Left Kidney: Renal measurements: 12.1 x 6.2 x 6.3 cm = volume: 247 mL. Normal echogenicity. No hydronephrosis or shadowing stone. Bladder: Appears normal for degree of bladder distention. Other: None. IMPRESSION: Unremarkable renal ultrasound. Electronically Signed   By: Vanetta Chou M.D.   On: 10/26/2023 15:03    Scheduled Meds:  amLODipine   10 mg Oral  Daily   aspirin  EC  81 mg Oral Daily   carvedilol   25 mg Oral BID WC   clotrimazole -betamethasone   1 Application Topical BID   enoxaparin  (LOVENOX ) injection  120 mg Subcutaneous Q24H   Fe Fum-Vit C-Vit B12-FA  1 capsule Oral QPC breakfast   hydrALAZINE   100 mg Oral Q8H   [START ON 10/30/2023] irbesartan   150 mg Oral Daily   [START ON 10/30/2023] spironolactone   25 mg Oral BID   Vitamin D  (Ergocalciferol )  50,000 Units Oral Q7 days   Continuous Infusions:  cefTRIAXone  (ROCEPHIN )  IV Stopped (10/27/23 1031)   doxycycline  (VIBRAMYCIN ) IV 100 mg (10/27/23 0856)   PRN Meds: acetaminophen  **OR** acetaminophen , HYDROcodone -acetaminophen , ondansetron  **OR** ondansetron  (ZOFRAN ) IV  Time spent: 55 minutes  Author: ELVAN SOR. MD [facility] 10/27/2023 1:28 PM  To reach On-call, see care teams to locate the attending and  reach out to them via [email or web address]. If 7PM-7AM, please contact night-coverage If you still have difficulty reaching the attending provider, please page the [facility] (Director on Call) for [facility] on amion for assistance.

## 2023-10-27 NOTE — ED Notes (Signed)
 No alarms going off at this time.  Pt's face mask has been working appropriately.

## 2023-10-27 NOTE — Plan of Care (Signed)
   Problem: Fluid Volume: Goal: Hemodynamic stability will improve Outcome: Progressing   Problem: Clinical Measurements: Goal: Diagnostic test results will improve Outcome: Progressing Goal: Signs and symptoms of infection will decrease Outcome: Progressing   Problem: Education: Goal: Knowledge of General Education information will improve Description: Including pain rating scale, medication(s)/side effects and non-pharmacologic comfort measures Outcome: Progressing
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
## 2023-10-28 ENCOUNTER — Other Ambulatory Visit: Payer: Self-pay

## 2023-10-28 DIAGNOSIS — R7881 Bacteremia: Secondary | ICD-10-CM | POA: Diagnosis not present

## 2023-10-28 DIAGNOSIS — S81801A Unspecified open wound, right lower leg, initial encounter: Secondary | ICD-10-CM | POA: Diagnosis not present

## 2023-10-28 DIAGNOSIS — Z6841 Body Mass Index (BMI) 40.0 and over, adult: Secondary | ICD-10-CM

## 2023-10-28 DIAGNOSIS — N189 Chronic kidney disease, unspecified: Secondary | ICD-10-CM

## 2023-10-28 DIAGNOSIS — B954 Other streptococcus as the cause of diseases classified elsewhere: Secondary | ICD-10-CM

## 2023-10-28 DIAGNOSIS — S81802A Unspecified open wound, left lower leg, initial encounter: Secondary | ICD-10-CM

## 2023-10-28 DIAGNOSIS — I129 Hypertensive chronic kidney disease with stage 1 through stage 4 chronic kidney disease, or unspecified chronic kidney disease: Secondary | ICD-10-CM

## 2023-10-28 DIAGNOSIS — F17210 Nicotine dependence, cigarettes, uncomplicated: Secondary | ICD-10-CM

## 2023-10-28 DIAGNOSIS — L03116 Cellulitis of left lower limb: Secondary | ICD-10-CM | POA: Diagnosis not present

## 2023-10-28 DIAGNOSIS — I890 Lymphedema, not elsewhere classified: Secondary | ICD-10-CM

## 2023-10-28 LAB — CULTURE, BLOOD (ROUTINE X 2)
Special Requests: ADEQUATE
Special Requests: ADEQUATE

## 2023-10-28 LAB — BASIC METABOLIC PANEL WITH GFR
Anion gap: 11 (ref 5–15)
BUN: 37 mg/dL — ABNORMAL HIGH (ref 6–20)
CO2: 26 mmol/L (ref 22–32)
Calcium: 8.3 mg/dL — ABNORMAL LOW (ref 8.9–10.3)
Chloride: 97 mmol/L — ABNORMAL LOW (ref 98–111)
Creatinine, Ser: 2 mg/dL — ABNORMAL HIGH (ref 0.61–1.24)
GFR, Estimated: 44 mL/min — ABNORMAL LOW (ref >60)
Glucose, Bld: 106 mg/dL — ABNORMAL HIGH (ref 70–99)
Potassium: 3.3 mmol/L — ABNORMAL LOW (ref 3.5–5.1)
Sodium: 134 mmol/L — ABNORMAL LOW (ref 135–145)

## 2023-10-28 LAB — CBC
HCT: 32.3 % — ABNORMAL LOW (ref 39.0–52.0)
Hemoglobin: 9.8 g/dL — ABNORMAL LOW (ref 13.0–17.0)
MCH: 23.3 pg — ABNORMAL LOW (ref 26.0–34.0)
MCHC: 30.3 g/dL (ref 30.0–36.0)
MCV: 76.7 fL — ABNORMAL LOW (ref 80.0–100.0)
Platelets: 196 K/uL (ref 150–400)
RBC: 4.21 MIL/uL — ABNORMAL LOW (ref 4.22–5.81)
RDW: 18.8 % — ABNORMAL HIGH (ref 11.5–15.5)
WBC: 11.2 K/uL — ABNORMAL HIGH (ref 4.0–10.5)
nRBC: 0 % (ref 0.0–0.2)

## 2023-10-28 LAB — PHOSPHORUS: Phosphorus: 3.6 mg/dL (ref 2.5–4.6)

## 2023-10-28 LAB — MAGNESIUM: Magnesium: 2.1 mg/dL (ref 1.7–2.4)

## 2023-10-28 MED ORDER — POTASSIUM CHLORIDE CRYS ER 20 MEQ PO TBCR
20.0000 meq | EXTENDED_RELEASE_TABLET | Freq: Once | ORAL | Status: AC
  Administered 2023-10-28: 20 meq via ORAL
  Filled 2023-10-28: qty 1

## 2023-10-28 MED ORDER — CHLORHEXIDINE GLUCONATE CLOTH 2 % EX PADS
6.0000 | MEDICATED_PAD | Freq: Every day | CUTANEOUS | Status: DC
  Administered 2023-10-28 – 2023-10-30 (×3): 6 via TOPICAL

## 2023-10-28 MED ORDER — MUPIROCIN 2 % EX OINT
1.0000 | TOPICAL_OINTMENT | Freq: Two times a day (BID) | CUTANEOUS | Status: DC
  Administered 2023-10-28 – 2023-10-30 (×5): 1 via NASAL
  Filled 2023-10-28: qty 22

## 2023-10-28 NOTE — Evaluation (Signed)
 Occupational Therapy Evaluation Patient Details Name: Jared Mccoy MRN: [id number] DOB: 1990-11-05 Today's Date: 10/28/2023   History of Present Illness   Pt is  a 33 year old male dmitted with sepsis secondary from suspected lower extremity cellulitis possibly related to a wound on his left leg .    PMH signfiicant for Morbid obesity with suspected OSA, paroxysmal A-fib not on AC, HTN, chronic lower extremity edema (noncardiac), for which he was hospitalized in July  (7/22 to 7/24), and started on diuretics and Unna boots,     Clinical Impressions Chart reviewed to date, pt greeted semi supine in bed, agreeable to OT evaluation. PTA pt report he is generally MOD I with ADL/IADL, increased difficulties accessing his LB and performing peri care. Pt does appear to have BLE wrapped right now. Pt presents with deficits in strength ,endurance, activity tolerance, balance, affecting safe and optimal ADL completion. Bed mobility competed generally at a supervision level. He amb with no AD approx 200', increased fatigue with continued activity. Pt reports he feels close to recent baseline, perhaps a bit weaker. Pt is left in bedside chair, all needs met. OT will follow acutely to facilitate optimal ADL/functional mobility performance.      If plan is discharge home, recommend the following:   A little help with bathing/dressing/bathroom     Functional Status Assessment   Patient has had a recent decline in their functional status and demonstrates the ability to make significant improvements in function in a reasonable and predictable amount of time.     Equipment Recommendations    (bariatric BSC)     Recommendations for Other Services         Precautions/Restrictions   Precautions Precautions: Fall Recall of Precautions/Restrictions: Intact Restrictions Weight Bearing Restrictions Per Provider Order: No     Mobility Bed Mobility Overal bed mobility: Needs Assistance Bed  Mobility: Supine to Sit     Supine to sit: Supervision          Transfers Overall transfer level: Needs assistance   Transfers: Sit to/from Stand Sit to Stand: Supervision, Modified independent (Device/Increase time)                  Balance Overall balance assessment: Needs assistance Sitting-balance support: Feet supported Sitting balance-Leahy Scale: Good     Standing balance support: During functional activity Standing balance-Leahy Scale: Fair                             ADL either performed or assessed with clinical judgement   ADL Overall ADL's : Needs assistance/impaired Eating/Feeding: Modified independent   Grooming: Modified independent           Upper Body Dressing : Minimal assistance;Sitting   Lower Body Dressing: Maximal assistance;Sitting/lateral leans   Toilet Transfer: Contact guard assist;Ambulation Toilet Transfer Details (indicate cue type and reason): simulated with no AD         Functional mobility during ADLs: Contact guard assist (approx 200' with chair follow)       Vision Patient Visual Report: No change from baseline       Perception         Praxis         Pertinent Vitals/Pain Pain Assessment Pain Assessment: 0-10 Pain Score: 6  Pain Location: initially 5/10 HA, after mobility no HA, but 6/10 pain in LLE Pain Descriptors / Indicators: Discomfort Pain Intervention(s): Monitored during session, Repositioned, Limited activity within  patient's tolerance (PT notified RN)     Extremity/Trunk Assessment Upper Extremity Assessment Upper Extremity Assessment: Overall WFL for tasks assessed   Lower Extremity Assessment Lower Extremity Assessment: Defer to PT evaluation       Communication Communication Communication: No apparent difficulties   Cognition Arousal: Alert Behavior During Therapy: WFL for tasks assessed/performed Cognition: No apparent impairments                                Following commands: Intact       Cueing  General Comments   Cueing Techniques: Verbal cues  spo2 96% on RA pre mobility, HR 69 bpm; Spo2 98% post mobility, HR 104 bpm; pt c/o fatigue   Exercises Other Exercises Other Exercises: edu re role of OT, role of rehab, safe ADL completion   Shoulder Instructions      Home Living Family/patient expects to be discharged to:: Private residence Living Arrangements: Children (sister) Available Help at Discharge: Family;Friend(s);Available PRN/intermittently Type of Home: House Home Access: Level entry     Home Layout: Multi-level;Able to live on main level with bedroom/bathroom Alternate Level Stairs-Number of Steps: flight Alternate Level Stairs-Rails: Right Bathroom Shower/Tub: Tub/shower unit;Walk-in shower         Home Equipment: None          Prior Functioning/Environment Prior Level of Function : Independent/Modified Independent;Driving             Mobility Comments: amb with no AD home/community distances ADLs Comments: MOD I-I with ADL/IADL; has three young daugthers he cares for, cooks/cleans/drives    OT Problem List: Decreased strength;Impaired balance (sitting and/or standing);Decreased activity tolerance;Decreased knowledge of use of DME or AE   OT Treatment/Interventions: Self-care/ADL training;DME and/or AE instruction;Therapeutic activities;Balance training;Therapeutic exercise;Energy conservation;Patient/family education      OT Goals(Current goals can be found in the care plan section)   Acute Rehab OT Goals Patient Stated Goal: improve function OT Goal Formulation: With patient Time For Goal Achievement: 11/11/23 Potential to Achieve Goals: Good ADL Goals Pt Will Perform Grooming: with modified independence;sitting;standing Pt Will Perform Lower Body Dressing: with modified independence;sitting/lateral leans;sit to/from stand Pt Will Transfer to Toilet: with modified  independence;ambulating Pt Will Perform Toileting - Clothing Manipulation and hygiene: with modified independence;sit to/from stand   OT Frequency:  Min 3X/week    Co-evaluation              AM-PAC OT 6 Clicks Daily Activity     Outcome Measure Help from another person eating meals?: None Help from another person taking care of personal grooming?: None Help from another person toileting, which includes using toliet, bedpan, or urinal?: A Little Help from another person bathing (including washing, rinsing, drying)?: A Little Help from another person to put on and taking off regular upper body clothing?: None Help from another person to put on and taking off regular lower body clothing?: A Lot 6 Click Score: 20   End of Session Equipment Utilized During Treatment: Gait belt Nurse Communication: Mobility status  Activity Tolerance: Patient tolerated treatment well Patient left: in chair;with call bell/phone within reach;with chair alarm set  OT Visit Diagnosis: Other abnormalities of gait and mobility (R26.89);Muscle weakness (generalized) (M62.81)                Time: 9084-9055 OT Time Calculation (min): 29 min Charges:  OT General Charges $OT Visit: 1 Visit OT Evaluation $OT Eval Moderate Complexity: 1 Mod  Therisa Sheffield, OTD OTR/L  10/28/23, 10:35 AM

## 2023-10-28 NOTE — Progress Notes (Signed)
 Mobility Specialist Progress Note:    10/28/23 1421  Mobility  Activity Pivoted/transferred from chair to bed  Level of Assistance Modified independent, requires aide device or extra time  Assistive Device None  Distance Ambulated (ft) 3 ft  Range of Motion/Exercises Active;All extremities  Activity Response Tolerated well  Mobility visit 1 Mobility  Mobility Specialist Start Time (ACUTE ONLY) 1411  Mobility Specialist Stop Time (ACUTE ONLY) 1420  Mobility Specialist Time Calculation (min) (ACUTE ONLY) 9 min   Pt received in chair, not agreeable to ambulation but requesting to get back in bed. Modi to stand and transfer with no AD. Alarm on, all needs met.  Sherrilee Ditty Mobility Specialist Please contact via Special educational needs teacher or  Rehab office at [phone]

## 2023-10-28 NOTE — Evaluation (Signed)
 Physical Therapy Evaluation Patient Details Name: Jared Mccoy MRN: [id number] DOB: 06-Nov-1990 Today's Date: 10/28/2023  History of Present Illness  Pt is a 33 y.o. male initially presenting to hospital 10/24/23 with c/o fall through a wooden porch (wounds to B LE's noted); pt discharged and returned next day with concerns of chills, tremors, and body aches; wound to L leg also hurting significantly since then.  Pt admitted with cellulitis of L LE, wound B LE's, chronic lymphedema/venous stasis, hypertensive urgency, paroxysmal a-fib.  PMH includes morbid obesity with suspected OSA, paroxysmal a-fib not on AC, htn, chronic LE edema, PVD, ORIF distal radial fx L 2018, ORIF mandibular fx 2018.  Clinical Impression  PT/OT co-evaluation performed.  Prior to recent medical concerns, pt reports being independent with functional mobility; lives with his sister and his 3 children (7, 51, and 56 y.o.); no STE home but has flight of steps to 2nd floor bedroom.  Currently pt is SBA semi-supine to sitting EOB; modified independent with transfers; and CGA ambulating 200 feet (no AD use).  Pt appearing to fatigue with increased distance ambulating; increased B lateral sway noted during ambulation and generalized weakness.  Pt appearing motivated to get up and move.  Pt would currently benefit from skilled PT to address noted impairments and functional limitations (see below for any additional details).  Upon hospital discharge, pt would benefit from ongoing therapy.     If plan is discharge home, recommend the following: A little help with walking and/or transfers;A little help with bathing/dressing/bathroom;Assistance with cooking/housework;Assist for transportation;Help with stairs or ramp for entrance   Can travel by private vehicle        Equipment Recommendations None recommended by PT  Recommendations for Other Services       Functional Status Assessment Patient has had a recent decline in their  functional status and demonstrates the ability to make significant improvements in function in a reasonable and predictable amount of time.     Precautions / Restrictions Precautions Precautions: Fall Recall of Precautions/Restrictions: Intact Restrictions Weight Bearing Restrictions Per Provider Order: No      Mobility  Bed Mobility Overal bed mobility: Needs Assistance Bed Mobility: Supine to Sit     Supine to sit: Supervision     General bed mobility comments: SBA for lines    Transfers Overall transfer level: Modified independent   Transfers: Sit to/from Stand Sit to Stand: Modified independent (Device/Increase time)           General transfer comment: steady transfer from bed    Ambulation/Gait Ambulation/Gait assistance: Contact guard assist Gait Distance (Feet): 200 Feet Assistive device: None Gait Pattern/deviations: Step-through pattern Gait velocity: decreased     General Gait Details: mild increased B lateral sway but no loss of balance noted; chair follow for safety  Stairs            Wheelchair Mobility     Tilt Bed    Modified [name]       Balance Overall balance assessment: Needs assistance Sitting-balance support: No upper extremity supported, Feet supported Sitting balance-Leahy Scale: Good Sitting balance - Comments: steady reaching within BOS   Standing balance support: During functional activity, No upper extremity supported Standing balance-Leahy Scale: Good Standing balance comment: increased B lateral sway during ambulation but no loss of balance noted  Pertinent Vitals/Pain Pain Assessment Pain Assessment: 0-10 Pain Score: 6  Pain Location: initially 5/10 HA; after mobility no HA, but 6/10 pain in LLE Pain Descriptors / Indicators: Discomfort Pain Intervention(s): Limited activity within patient's tolerance, Monitored during session, Repositioned,  Patient requesting pain meds-RN notified    Home Living Family/patient expects to be discharged to:: Private residence Living Arrangements: Children (Pt's sister and pt's 7, 4, and 3 y.o. children.) Available Help at Discharge: Family;Friend(s);Available PRN/intermittently Type of Home: House Home Access: Level entry     Alternate Level Stairs-Number of Steps: flight (13 steps) Home Layout: Multi-level;Able to live on main level with bedroom/bathroom Home Equipment: None      Prior Function Prior Level of Function : Independent/Modified Independent;Driving             Mobility Comments: Independent with ambulation with no AD home/community distances ADLs Comments: MOD I-I with ADL/IADL; has three young daugthers he cares for, cooks/cleans/drives     Extremity/Trunk Assessment   Upper Extremity Assessment Upper Extremity Assessment: Overall WFL for tasks assessed    Lower Extremity Assessment Lower Extremity Assessment: Generalized weakness    Cervical / Trunk Assessment Cervical / Trunk Assessment: Normal  Communication   Communication Communication: No apparent difficulties    Cognition Arousal: Alert Behavior During Therapy: WFL for tasks assessed/performed   PT - Cognitive impairments: No apparent impairments                         Following commands: Intact       Cueing Cueing Techniques: Verbal cues     General Comments General comments (skin integrity, edema, etc.): Pre-mobility HR 69 bpm with SpO2 sats 96% on room air; post mobility HR 104 bpm with SpO2 sats 98% on room air.  Nursing cleared pt for participation in physical therapy.  Pt agreeable to PT/OT session.    Exercises     Assessment/Plan    PT Assessment Patient needs continued PT services  PT Problem List Decreased strength;Decreased activity tolerance;Decreased balance;Decreased mobility;Pain;Decreased skin integrity       PT Treatment Interventions DME instruction;Gait  training;Stair training;Functional mobility training;Therapeutic activities;Therapeutic exercise;Balance training;Patient/family education    PT Goals (Current goals can be found in the Care Plan section)  Acute Rehab PT Goals Patient Stated Goal: to improve pain in L LE PT Goal Formulation: With patient Time For Goal Achievement: 11/11/23 Potential to Achieve Goals: Good    Frequency Min 2X/week     Co-evaluation PT/OT/SLP Co-Evaluation/Treatment: Yes Reason for Co-Treatment: For patient/therapist safety;To address functional/ADL transfers PT goals addressed during session: Mobility/safety with mobility OT goals addressed during session: ADL's and self-care       AM-PAC PT 6 Clicks Mobility  Outcome Measure Help needed turning from your back to your side while in a flat bed without using bedrails?: None Help needed moving from lying on your back to sitting on the side of a flat bed without using bedrails?: A Little Help needed moving to and from a bed to a chair (including a wheelchair)?: A Little Help needed standing up from a chair using your arms (e.g., wheelchair or bedside chair)?: A Little Help needed to walk in hospital room?: A Little Help needed climbing 3-5 steps with a railing? : A Little 6 Click Score: 19    End of Session Equipment Utilized During Treatment: Gait belt Activity Tolerance: Patient tolerated treatment well Patient left: in chair;with call bell/phone within reach;with chair alarm set (sitting in bariatric  recliner) Nurse Communication: Mobility status;Precautions;Patient requests pain meds PT Visit Diagnosis: Other abnormalities of gait and mobility (R26.89);Muscle weakness (generalized) (M62.81);Pain Pain - Right/Left: Left Pain - part of body: Leg    Time: 9080-9055 PT Time Calculation (min) (ACUTE ONLY): 25 min   Charges:   PT Evaluation $PT Eval Low Complexity: 1 Low   PT General Charges $$ ACUTE PT VISIT: 1 Visit        Damien Caulk, PT 10/28/23, 12:17 PM

## 2023-10-28 NOTE — Progress Notes (Signed)
 [facility] Progress Note  Patient: Jared Mccoy    FMW:[id number]  DOA: 10/25/2023     Date of Service: the patient was seen and examined on 10/28/2023  No chief complaint on file.  Brief hospital course: ROLAND LIPKE is a 33 y.o. adult with medical history significant for Morbid obesity with suspected OSA, paroxysmal A-fib not on AC, HTN, chronic lower extremity edema (noncardiac), for which he was hospitalized in July  (7/22 to 7/24), and started on diuretics and Unna boots, with being admitted with sepsis secondary from suspected lower extremity cellulitis possibly related to a wound on his left leg . On the day of arrival, he awoke with bodyaches and chills. In the ED Tmax 103 and tachycardic to 106, hypertensive with BP up to 211/112.Labs with WBC 11,000 and lactic acid 1.5.  Creatinine at baseline at 1.36, potassium 3.1, hemoglobin at baseline at 10.9. EKG showed sinus rhythm at 97 Left tib-fib showing soft tissue edema without gas or bony abnormality. Patient unable to get CT due to his weight Patient started on vancomycin  and cefepime . Admission requested.   Assessment and Plan:  # Sepsis secondary to Cellulitis of left lower extremity Wound bilateral lower extremities Chronic lymphedema/venous stasis Sepsis criteria include fever and tachycardia with leukocytosis S/p Sepsis fluids, stopped due to worsening of respiratory failure S/p vancomycin  and cefepime  9/14 started ceftriaxone  2 g IV daily and doxycycline  blood cultures growing strep, sensitivity report reviewed  Pain control Keep leg elevated MRSA screen is positive  TTE: LVEF 65 to 70%, no significant valvular abnormality, no vegetations, poor window to visualize valves. 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 Check renal functions and urine output Continue Unna boots  # AKI, most likely due to sepsis Check renal function daily and monitor urine output  Avoided IV fluid due to respiratory failure S/p Lasix  given due to respiratory failure and lower extremity edema 9/15 discontinued Lasix  infusion due to worsening of renal functions US  renal: Unremarkable renal ultrasound.  Avoid nephrotoxic medication, use renally dosed medications  sCr 1.92>>2.0 slightly elevated 9/15 hold off irbesartan  and Aldactone  for few days Bladder scan shows 148 mL urine, no significant retention   # Hypertensive urgency  Continue home amlodipine , carvedilol , hydralazine  9/15 Held irbesartan  and spironolactone  due to elevated creatinine Monitor BP and titrate medications accordingly    # Paroxysmal atrial fibrillation (HCC) Not currently on anticoagulation, unknown reason.  Recommend to follow with cardiology as an outpatient started aspirin  81 mg p.o. daily  # Vitamin D  deficiency: started vitamin D  50,000 units p.o. weekly, follow with PCP to repeat vitamin D  level after 3 to 6 months.  # Iron deficiency, Tsat 6%, elevated IV iron due to active infection Started oral iron supplement with vitamin C.  Follow-up with PCP to repeat iron profile after 3 to 6 months  # Folic acid  level 7.6, lower end.  Started Trigel supplement # Vitamin B12 level 365, goal >400, started Trigel oral supplement   # Super morbid obesity Body mass index is 69.97 kg/m.  Interventions: Calorie restricted diet and daily exercise advised to lose body weight.  Lifestyle modification discussed.  Diet: Heart healthy diet DVT Prophylaxis: Subcutaneous Lovenox    Advance goals of care discussion: Full code  Family Communication: family was not  present at bedside, at the time of interview.  The pt provided permission to discuss medical plan with the family. Opportunity was given to ask question and all questions were answered satisfactorily.   Disposition:  Pt is from Home, admitted with sepsis due to cellulitis, still on IV Abx, which precludes a safe discharge. Discharge to Home, when stable, may need few days to improve.  Subjective: No significant events overnight, patient was being calm in the recliner.  Patient was sleepy, stated that pain is under control with current pain medications.  Denied any specific complaints.   Physical Exam: General: NAD, lying comfortably, morbidly obese Appear in no distress, affect appropriate Eyes: PERRLA ENT: Oral Mucosa Clear, moist  Neck: no JVD,  Cardiovascular: S1 and S2 Present, no Murmur,  Respiratory: good respiratory effort, Bilateral Air entry equal and Decreased, no Crackles, no wheezes Abdomen: Bowel Sound present, Soft and no tenderness,  Skin: no rashes Extremities: LLE erythema and tenderness, chronic lymphedema bilateral LE Neurologic: without any new focal findings Gait not checked due to patient safety concerns  Vitals:   10/27/23 2309 10/28/23 0438 10/28/23 0850 10/28/23 1245  BP: 111/64 (!) 151/89 128/78 112/67  Pulse: 71 (!) 58 77 68  Resp: 20 20 16 18   Temp: 99 F (37.2 C) 98.5 F (36.9 C) 98.4 F (36.9 C) 98.3 F (36.8 C)  TempSrc:      SpO2: 97% 94% 92% 96%  Weight:      Height:        Intake/Output Summary (Last 24 hours) at 10/28/2023 1443 Last data filed at 10/28/2023 1300 Gross per 24 hour  Intake 960 ml  Output --  Net 960 ml   Filed Weights   10/25/23 1907 10/25/23 1910  Weight: (!) 235.9 kg (!) 234 kg    Data Reviewed: I have personally reviewed and interpreted daily labs, tele strips, imagings as discussed above. I reviewed all nursing notes, pharmacy notes, vitals, pertinent old records I have discussed plan of care as described above  with RN and patient/family.  CBC: Recent Labs  Lab 10/24/23 2112 10/25/23 2021 10/26/23 0916 10/27/23 0435 10/28/23 0625  WBC  --  11.0* 18.6* 12.5* 11.2*  NEUTROABS  --  9.5*  --   --   --   HGB 12.2* 10.9* 10.8* 10.4* 9.8*  HCT 36.0* 36.8* 35.9* 34.5* 32.3*  MCV  --  78.3* 75.9* 77.4* 76.7*  PLT  --  212 205 200 196   Basic Metabolic Panel: Recent Labs  Lab 10/24/23 2112 10/25/23 2021 10/26/23 0916 10/27/23 0435 10/28/23 0625  NA 142 137 136 135 134*  K 3.2* 3.1* 3.4* 3.4* 3.3*  CL 102 99 100 97* 97*  CO2  --  29 26 28 26   GLUCOSE 85 88 105* 111* 106*  BUN 19 16 19  26* 37*  CREATININE 1.40* 1.36* 1.73* 1.92* 2.00*  CALCIUM  --  8.4* 8.1* 7.9* 8.3*  MG  --   --  1.7 1.8 2.1  PHOS  --   --  2.7 3.5 3.6    Studies: No results found.   Scheduled Meds:  acetaminophen   650 mg Oral TID   amLODipine   10 mg Oral Daily   aspirin  EC  81 mg Oral Daily   carvedilol   25 mg Oral BID WC   Chlorhexidine  Gluconate Cloth  6 each  Topical Daily   clotrimazole -betamethasone   1 Application Topical BID   enoxaparin  (LOVENOX ) injection  120 mg Subcutaneous Q24H   Fe Fum-Vit C-Vit B12-FA  1 capsule Oral QPC breakfast   hydrALAZINE   100 mg Oral Q8H   [START ON 10/30/2023] irbesartan   150 mg Oral Daily   mupirocin  ointment  1 Application Nasal BID   [START ON 10/30/2023] spironolactone   25 mg Oral BID   Vitamin D  (Ergocalciferol )  50,000 Units Oral Q7 days   Continuous Infusions:  cefTRIAXone  (ROCEPHIN )  IV 2 g (10/28/23 1014)   doxycycline  (VIBRAMYCIN ) IV 100 mg (10/28/23 0609)   PRN Meds: ondansetron  **OR** ondansetron  (ZOFRAN ) IV, oxyCODONE   Time spent: 55 minutes  Author: ELVAN SOR. MD [facility] 10/28/2023 2:43 PM  To reach On-call, see care teams to locate the attending and reach out to them via [email or web address]. If 7PM-7AM, please contact night-coverage If you still have difficulty reaching the attending provider, please page the [facility] (Director on Call) for [facility] on amion for assistance.

## 2023-10-28 NOTE — Consult Note (Signed)
 WOC team consulted for unna boots. Will plan to see Wednesday 10/29/2023.   Thank you,    Powell Bar MSN, RN-BC, [employer]

## 2023-10-28 NOTE — Care Management Important Message (Signed)
 Important Message  Patient Details  Name: Jared Mccoy MRN: [id number] Date of Birth: [birth year]   Important Message Given:  Yes - Medicare IM     Jared Mccoy 10/28/2023, 11:17 AM

## 2023-10-28 NOTE — Plan of Care (Signed)
   Problem: Fluid Volume: Goal: Hemodynamic stability will improve Outcome: Progressing   Problem: Clinical Measurements: Goal: Diagnostic test results will improve Outcome: Progressing Goal: Signs and symptoms of infection will decrease Outcome: Progressing

## 2023-10-29 ENCOUNTER — Other Ambulatory Visit: Payer: Self-pay

## 2023-10-29 DIAGNOSIS — B955 Unspecified streptococcus as the cause of diseases classified elsewhere: Secondary | ICD-10-CM

## 2023-10-29 DIAGNOSIS — I160 Hypertensive urgency: Secondary | ICD-10-CM

## 2023-10-29 DIAGNOSIS — L03116 Cellulitis of left lower limb: Secondary | ICD-10-CM | POA: Diagnosis not present

## 2023-10-29 LAB — BASIC METABOLIC PANEL WITH GFR
Anion gap: 9 (ref 5–15)
BUN: 38 mg/dL — ABNORMAL HIGH (ref 6–20)
CO2: 30 mmol/L (ref 22–32)
Calcium: 8.5 mg/dL — ABNORMAL LOW (ref 8.9–10.3)
Chloride: 98 mmol/L (ref 98–111)
Creatinine, Ser: 1.61 mg/dL — ABNORMAL HIGH (ref 0.61–1.24)
GFR, Estimated: 57 mL/min — ABNORMAL LOW (ref >60)
Glucose, Bld: 86 mg/dL (ref 70–99)
Potassium: 3.4 mmol/L — ABNORMAL LOW (ref 3.5–5.1)
Sodium: 137 mmol/L (ref 135–145)

## 2023-10-29 LAB — MAGNESIUM: Magnesium: 2.3 mg/dL (ref 1.7–2.4)

## 2023-10-29 LAB — PHOSPHORUS: Phosphorus: 3.3 mg/dL (ref 2.5–4.6)

## 2023-10-29 LAB — CBC
HCT: 33.3 % — ABNORMAL LOW (ref 39.0–52.0)
Hemoglobin: 9.9 g/dL — ABNORMAL LOW (ref 13.0–17.0)
MCH: 23.2 pg — ABNORMAL LOW (ref 26.0–34.0)
MCHC: 29.7 g/dL — ABNORMAL LOW (ref 30.0–36.0)
MCV: 78.2 fL — ABNORMAL LOW (ref 80.0–100.0)
Platelets: 247 K/uL (ref 150–400)
RBC: 4.26 MIL/uL (ref 4.22–5.81)
RDW: 18.6 % — ABNORMAL HIGH (ref 11.5–15.5)
WBC: 8.9 K/uL (ref 4.0–10.5)
nRBC: 0 % (ref 0.0–0.2)

## 2023-10-29 NOTE — TOC Initial Note (Addendum)
 Transition of Care [facility]) - Initial/Assessment Note    Patient Details  Name: Jared Mccoy MRN: [id number] Date of Birth: Sep 27, 1990  Transition of Care [facility]) CM/SW Contact:    Lauraine [name], LCSW Phone Number: 10/29/2023, 12:28 PM  Clinical Narrative:   CSW met with patient. No family at bedside. CSW introduced role and explained that therapy recommendations would be discussed. He is agreeable to home health. After reviewing the CMS list, he said [facility] was first preference. They do not accept his insurance but said they think [facility] is the only agency that does. [facility] accepted for PT, OT, RN. Patient is agreeable to 3-in-1. Will order closer to discharge. Patient is unsure who will pick him up at discharge. No further concerns. CSW will continue to follow patient for support and facilitate return home once stable.               4:06 pm: Per MD note, likely discharge tomorrow. Ordered bariatric 3-in-1 through [facility].  Expected Discharge Plan: Home w Home Health Services Barriers to Discharge: Continued Medical Work up   Patient Goals and CMS Choice   CMS Medicare.gov Compare Post Acute Care list provided to:: Patient        Expected Discharge Plan and Services     Post Acute Care Choice: Home Health, Durable Medical Equipment Living arrangements for the past 2 months: Single Family Home                           HH Arranged: RN, PT, OT [facility] Agency: [facility] Date HH Agency Contacted: 10/29/23   Representative spoke with at [facility] Agency: Shaun  Prior Living Arrangements/Services Living arrangements for the past 2 months: Single Family Home   Patient language and need for interpreter reviewed:: Yes Do you feel safe going back to the place where you live?: Yes      Need for Family Participation in Patient Care: Yes (Comment)     Criminal Activity/Legal Involvement Pertinent to Current Situation/Hospitalization: No - Comment as  needed  Activities of Daily Living   ADL Screening (condition at time of admission) Independently performs ADLs?: Yes (appropriate for developmental age) Is the patient deaf or have difficulty hearing?: No Does the patient have difficulty seeing, even when wearing glasses/contacts?: No Does the patient have difficulty concentrating, remembering, or making decisions?: No  Permission Sought/Granted Permission sought to share information with : Facility Industrial/product designer granted to share information with : Yes, Verbal Permission Granted     Permission granted to share info w AGENCY: Home Health Agencies        Emotional Assessment Appearance:: Appears stated age Attitude/Demeanor/Rapport: Engaged Affect (typically observed): Accepting, Calm Orientation: : Oriented to Self, Oriented to Place, Oriented to  Time, Oriented to Situation Alcohol / Substance Use: Not Applicable Psych Involvement: No (comment)  Admission diagnosis:  Cellulitis of left lower extremity [L03.116] Fever, unspecified fever cause [R50.9] Cellulitis, unspecified cellulitis site [L03.90] Patient Active Problem List   Diagnosis Date Noted   Cellulitis of left lower extremity 10/25/2023   Morbid obesity with BMI of 60.0-69.9, adult (HCC) 10/25/2023   Hypertensive urgency 10/25/2023   Leg wound, left, initial encounter 10/25/2023   Leg edema 09/03/2023   Resistant hypertension 09/03/2023   Morbid obesity (HCC) 09/03/2023   Peripheral edema 09/03/2023   Paroxysmal atrial fibrillation (HCC) 07/13/2023   New onset a-fib (HCC) 07/12/2023   Excessive daytime sleepiness 07/09/2023  Obesity hypoventilation syndrome (HCC) 07/09/2023   Sepsis (HCC) 04/06/2023   Lymphedema 04/06/2023   Influenza A 04/06/2023   Leukocytosis 04/06/2023   AKI (acute kidney injury) (HCC) 04/06/2023   Essential hypertension 04/06/2023   Hypokalemia 08/11/2022   Wound infection 08/09/2022   Multiple trauma 11/10/2016    PCP:  Celestia Rosaline SQUIBB, NP Pharmacy:   [facility] [address] [place] [address] Phone: [phone] Fax: [phone]  [facility] [address] [place] ([place]), [place] - [address] [place] ([place]) [place] [address] Phone: [phone] Fax: [phone]     Social Drivers of Health (SDOH) Social History: SDOH Screenings   Food Insecurity: No Food Insecurity (10/27/2023)  Housing: Low Risk  (10/27/2023)  Transportation Needs: No Transportation Needs (10/27/2023)  Utilities: Not At Risk (10/27/2023)  Depression (PHQ2-9): Low Risk  (09/11/2023)  Tobacco Use: High Risk (10/25/2023)   SDOH Interventions:     Readmission Risk Interventions    10/29/2023   12:27 PM  Readmission Risk Prevention Plan  PCP or Specialist Appt within 3-5 Days Complete  HRI or Home Care Consult Complete  Social Work Consult for Recovery Care Planning/Counseling Complete  Palliative Care Screening Not Applicable

## 2023-10-29 NOTE — Progress Notes (Signed)
 [facility] Progress Note  Patient: Jared Mccoy    FMW:[id number]  DOA: 10/25/2023     Date of Service: the patient was seen and examined on 10/29/2023  No chief complaint on file.  Brief hospital course: Jared Mccoy is a 33 y.o. adult with medical history significant for Morbid obesity with suspected OSA, paroxysmal A-fib not on AC, HTN, chronic lower extremity edema (noncardiac), for which he was hospitalized in July  (7/22 to 7/24), and started on diuretics and Unna boots, with being admitted with sepsis secondary from suspected lower extremity cellulitis possibly related to a wound on his left leg . On the day of arrival, he awoke with bodyaches and chills. In the ED Tmax 103 and tachycardic to 106, hypertensive with BP up to 211/112.Labs with WBC 11,000 and lactic acid 1.5.  Creatinine at baseline at 1.36, potassium 3.1, hemoglobin at baseline at 10.9. EKG showed sinus rhythm at 97 Left tib-fib showing soft tissue edema without gas or bony abnormality. Patient unable to get CT due to his weight Patient started on vancomycin  and cefepime . Admission requested.   Assessment and Plan:  # Sepsis secondary to Cellulitis of left lower extremity Wound bilateral lower extremities Chronic lymphedema/venous stasis Sepsis criteria include fever and tachycardia with leukocytosis S/p Sepsis fluids, stopped due to worsening of respiratory failure S/p vancomycin  and cefepime  9/14 started ceftriaxone  2 g IV daily and doxycycline  blood cultures growing strep, sensitivity report reviewed  Pain control Keep leg elevated MRSA screen is positive  TTE: LVEF 65 to 70%, no significant valvular abnormality, no vegetations, poor window to visualize valves. 9/15 ID consulted 9/16 follow repeat blood cultures 916 f/u wound culture bilateral lower extremity sent by ID   # Acute hypoxic respiratory failure secondary to pulmonary vascular congestion, hypoventilation syndrome due to super  morbid obesity and OSA  Continue supplemental O2 evaluation gradually wean off S/p IV Lasix  infusion, DC'd on 9/15 due to renal failure  # Chronic bilateral lower extremity lymphedema S/p Lasix  IV infusion, DC'd on 9/15 due to elevated creatinine. Check renal functions and urine output Continue Unna boots  # AKI, most likely due to sepsis Check renal function daily and monitor urine output  Avoided IV fluid due to respiratory failure S/p Lasix  given due to respiratory failure and lower extremity edema 9/15 discontinued Lasix  infusion due to worsening of renal functions US  renal: Unremarkable renal ultrasound.  Avoid nephrotoxic medication, use renally dosed medications  sCr 1.92>>2.0 .>1.61 gradually improving 9/15 hold off irbesartan  and Aldactone  for few days Bladder scan shows 148 mL urine, no significant retention   # Hypertensive urgency  Continue home amlodipine , carvedilol , hydralazine  9/15 Held irbesartan  and spironolactone  due to elevated creatinine Monitor BP and titrate medications accordingly    # Paroxysmal atrial fibrillation (HCC) Not currently on anticoagulation, unknown reason.  Recommend to follow with cardiology as an outpatient started aspirin  81 mg p.o. daily  # Vitamin D  deficiency: started vitamin D  50,000 units p.o. weekly, follow with PCP to repeat vitamin D  level after 3 to 6 months.  # Iron deficiency, Tsat 6%, elevated IV iron due to active infection Started oral iron supplement with vitamin C.  Follow-up with PCP to repeat iron profile after 3 to 6 months  # Folic acid  level 7.6, lower end.  Started Trigel supplement # Vitamin B12 level 365, goal >400, started Trigel oral supplement   # Super morbid obesity Body mass index is 69.97 kg/m.  Interventions: Calorie restricted diet and daily exercise  advised to lose body weight.  Lifestyle modification discussed.   Diet: Heart healthy diet DVT Prophylaxis: Subcutaneous Lovenox    Advance goals  of care discussion: Full code  Family Communication: family was not present at bedside, at the time of interview.  The pt provided permission to discuss medical plan with the family. Opportunity was given to ask question and all questions were answered satisfactorily.   Disposition:  Pt is from Home, admitted with sepsis due to cellulitis, still on IV Abx, which precludes a safe discharge. Discharge to Home, when stable, may need few days to improve. 9/17 D/C In AM f/u ID for Abx and duration    Subjective: No significant events overnight, patient was being comfortably on the bed.  He is feeling little bit improvement as compared to yesterday.  Denied any specific complaints.   Physical Exam: General: NAD, lying comfortably, morbidly obese Appear in no distress, affect appropriate Eyes: PERRLA ENT: Oral Mucosa Clear, moist  Neck: no JVD,  Cardiovascular: S1 and S2 Present, no Murmur,  Respiratory: good respiratory effort, Bilateral Air entry equal and Decreased, no Crackles, no wheezes Abdomen: Bowel Sound present, Soft and no tenderness,  Skin: no rashes Extremities: LLE erythema and tenderness, chronic lymphedema bilateral LE Neurologic: without any new focal findings Gait not checked due to patient safety concerns  Vitals:   10/29/23 0012 10/29/23 0350 10/29/23 0903 10/29/23 1226  BP: 134/72 (!) 155/95 (!) 145/61 135/82  Pulse: 69 62 68 68  Resp: 20 18    Temp: 97.6 F (36.4 C) 98.3 F (36.8 C) 98.2 F (36.8 C) 98.5 F (36.9 C)  TempSrc:   Oral Oral  SpO2: 97% 100% 100% 95%  Weight:      Height:        Intake/Output Summary (Last 24 hours) at 10/29/2023 1514 Last data filed at 10/29/2023 1418 Gross per 24 hour  Intake 960 ml  Output 1700 ml  Net -740 ml   Filed Weights   10/25/23 1907 10/25/23 1910  Weight: (!) 235.9 kg (!) 234 kg    Data Reviewed: I have personally reviewed and interpreted daily labs, tele strips, imagings as discussed above. I reviewed all  nursing notes, pharmacy notes, vitals, pertinent old records I have discussed plan of care as described above with RN and patient/family.  CBC: Recent Labs  Lab 10/25/23 2021 10/26/23 0916 10/27/23 0435 10/28/23 0625 10/29/23 0442  WBC 11.0* 18.6* 12.5* 11.2* 8.9  NEUTROABS 9.5*  --   --   --   --   HGB 10.9* 10.8* 10.4* 9.8* 9.9*  HCT 36.8* 35.9* 34.5* 32.3* 33.3*  MCV 78.3* 75.9* 77.4* 76.7* 78.2*  PLT 212 205 200 196 247   Basic Metabolic Panel: Recent Labs  Lab 10/25/23 2021 10/26/23 0916 10/27/23 0435 10/28/23 0625 10/29/23 0442  NA 137 136 135 134* 137  K 3.1* 3.4* 3.4* 3.3* 3.4*  CL 99 100 97* 97* 98  CO2 29 26 28 26 30   GLUCOSE 88 105* 111* 106* 86  BUN 16 19 26* 37* 38*  CREATININE 1.36* 1.73* 1.92* 2.00* 1.61*  CALCIUM 8.4* 8.1* 7.9* 8.3* 8.5*  MG  --  1.7 1.8 2.1 2.3  PHOS  --  2.7 3.5 3.6 3.3    Studies: No results found.   Scheduled Meds:  acetaminophen   650 mg Oral TID   amLODipine   10 mg Oral Daily   aspirin  EC  81 mg Oral Daily   carvedilol   25 mg Oral BID WC  Chlorhexidine  Gluconate Cloth  6 each Topical Daily   clotrimazole -betamethasone   1 Application Topical BID   enoxaparin  (LOVENOX ) injection  120 mg Subcutaneous Q24H   Fe Fum-Vit C-Vit B12-FA  1 capsule Oral QPC breakfast   hydrALAZINE   100 mg Oral Q8H   [START ON 10/30/2023] irbesartan   150 mg Oral Daily   mupirocin  ointment  1 Application Nasal BID   [START ON 10/30/2023] spironolactone   25 mg Oral BID   Vitamin D  (Ergocalciferol )  50,000 Units Oral Q7 days   Continuous Infusions:  cefTRIAXone  (ROCEPHIN )  IV 2 g (10/29/23 0950)   PRN Meds: ondansetron  **OR** ondansetron  (ZOFRAN ) IV, oxyCODONE   Time spent: 42 minutes  Author: ELVAN SOR. MD [facility] 10/29/2023 3:14 PM  To reach On-call, see care teams to locate the attending and reach out to them via [email or web address]. If 7PM-7AM, please contact night-coverage If you still have difficulty reaching the attending  provider, please page the [facility] (Director on Call) for [facility] on amion for assistance.

## 2023-10-29 NOTE — TOC CM/SW Note (Signed)
 Patient is not able to walk the distance required to go the bathroom, or he/she is unable to safely negotiate stairs required to access the bathroom.  A 3in1 BSC will alleviate this problem

## 2023-10-29 NOTE — Progress Notes (Signed)
 Mobility Specialist Progress Note:    10/29/23 0945  Mobility  Activity Refused and notified nurse if applicable   Pt refused mobility at this time d/t sleeping. This dino will attempt at a later time.   Sherrilee Ditty Mobility Specialist Please contact via Special educational needs teacher or  Rehab office at [phone]

## 2023-10-29 NOTE — Progress Notes (Signed)
 Primary RN received c/o this morning of bed linens not being changed since Sunday. Spoke with mobility tech, McKenzie, this afternoon who recounts personally changing sheets yesterday (Tues 9/16) after noticing they were soiled and before returning patient back to bed.

## 2023-10-29 NOTE — Consult Note (Signed)
 WOC Nurse Consult Note: Reason for Consult:Bilateral lower leg lymphedema, traumatic wounds to posterior lower legs from fall at home on porch.   Wound type: traumatic Pressure Injury POA: NA Measurement:Left lower leg:  3 cm x 1 cmx 0.2 cm  Right lower leg:  2 cm x 2 cmx 0.2 cm  Wound bed: Ruddy red Drainage (amount, consistency, odor) moderate serous weeping to lower legs.   Periwound: Chronic edema to bilateral legs, beginning above the knee.   Dressing procedure/placement/frequency:  Cleanse B lower leg wounds with Vashe wound cleanser Soila [phone]) do not rinse and allow to air dry. Apply silver hydrofiber ([place] [phone] Aquacel AG) cut to fit wound bed daily, cover with ABD pads and secure with Kerlix roll gauze beginning right above toes and ending right below knees. Secure with Coban for modified light compression.  Patient to elevate legs as often as possible.  RN changed dressings at 0600 this AM and patient has refused therapy due to sleeping.  ACE wraps are in place for now.  Can replace with COban at next dressing change per modified wound care orders.  Will not follow at this time.  Please re-consult if needed.  Darice Cooley MSN, RN, FNP-BC CWON Wound, Ostomy, Continence Nurse Outpatient [facility] [phone] Work cell phone:  [phone]

## 2023-10-29 NOTE — Progress Notes (Signed)
 Mobility Specialist Progress Note:    10/29/23 1357  Mobility  Activity Refused and notified nurse if applicable   Pt refused mobility. Educated the pt on benefits of ambulating/getting OOB and provided max encouragement. Pt still not agreeable, will attempt at later time.   Sherrilee Ditty Mobility Specialist Please contact via Special educational needs teacher or  Rehab office at [phone]

## 2023-10-29 NOTE — Progress Notes (Signed)
 9379 BLE dressings completed. Pt stated that he has not been given a bath and bed sheets have not been changed since Sunday, wash clothes, soap, and towels offered to pt. Encouraged pt to wash up independently, offered set up and help if needed. Bed sheets were changed by Andree, CNA. Minutes later an ED nurse called the floor reporting that the pt's sister (could not remember her name) called the ED to speak with the nurse that was taking care of her bother. Per ED report, the sister stated that the Pt called her to let her know that we will not allow him or give him a bath, and that he was in the bathroom crying. Per report, the sister kept yelling and was rude on the phone. We went to check on the Pt and the Pt was using the [facility]. Sister called the floor, she was made aware by the unit secretary that she is not on the contact list and if she does not have a password we cannot release any information. The call was transferred to the house supervisor. Corean, Manager was made aware of the situation.

## 2023-10-29 NOTE — Progress Notes (Signed)
 Date of Admission:  10/25/2023      ID: Jared Mccoy is a 33 y.o. adult Principal Problem:   Cellulitis of left lower extremity Active Problems:   Sepsis (HCC)   Paroxysmal atrial fibrillation (HCC)   Morbid obesity with BMI of 60.0-69.9, adult (HCC)   Hypertensive urgency   Leg wound, left, initial encounter    Subjective: Pt is feeling okay Has cough but that he says is baseline  Medications:   acetaminophen   650 mg Oral TID   amLODipine   10 mg Oral Daily   aspirin  EC  81 mg Oral Daily   carvedilol   25 mg Oral BID WC   Chlorhexidine  Gluconate Cloth  6 each Topical Daily   clotrimazole -betamethasone   1 Application Topical BID   enoxaparin  (LOVENOX ) injection  120 mg Subcutaneous Q24H   Fe Fum-Vit C-Vit B12-FA  1 capsule Oral QPC breakfast   hydrALAZINE   100 mg Oral Q8H   [START ON 10/30/2023] irbesartan   150 mg Oral Daily   mupirocin  ointment  1 Application Nasal BID   [START ON 10/30/2023] spironolactone   25 mg Oral BID   Vitamin D  (Ergocalciferol )  50,000 Units Oral Q7 days    Objective: Vital signs in last 24 hours: Patient Vitals for the past 24 hrs:  BP Temp Temp src Pulse Resp SpO2  10/29/23 0903 (!) 145/61 98.2 F (36.8 C) Oral 68 -- 100 %  10/29/23 0350 (!) 155/95 98.3 F (36.8 C) -- 62 18 100 %  10/29/23 0012 134/72 97.6 F (36.4 C) -- 69 20 97 %  10/28/23 2035 (!) 143/92 98.3 F (36.8 C) -- 64 20 92 %  10/28/23 1807 125/64 98.5 F (36.9 C) Oral 77 -- 99 %  10/28/23 1245 112/67 98.3 F (36.8 C) -- 68 18 96 %      PHYSICAL EXAM:  General: Alert, cooperative, no distress, morbid obesity Lungs: Clear to auscultation bilaterally. No Wheezing or Rhonchi. No rales. Heart: Regular rate and rhythm, no murmur, rub or gallop. Abdomen: Soft, non-tender,not distended. Bowel sounds normal. No masses Extremities: b/l lymohedema      Skin: No rashes or lesions. Or bruising Lymph: Cervical, supraclavicular normal. Neurologic: Grossly non-focal  Lab  Results    Latest Ref Rng & Units 10/29/2023    4:42 AM 10/28/2023    6:25 AM 10/27/2023    4:35 AM  CBC  WBC 4.0 - 10.5 K/uL 8.9  11.2  12.5   Hemoglobin 13.0 - 17.0 g/dL 9.9  9.8  89.5   Hematocrit 39.0 - 52.0 % 33.3  32.3  34.5   Platelets 150 - 400 K/uL 247  196  200        Latest Ref Rng & Units 10/29/2023    4:42 AM 10/28/2023    6:25 AM 10/27/2023    4:35 AM  CMP  Glucose 70 - 99 mg/dL 86  893  888   BUN 6 - 20 mg/dL 38  37  26   Creatinine 0.61 - 1.24 mg/dL 8.38  7.99  8.07   Sodium 135 - 145 mmol/L 137  134  135   Potassium 3.5 - 5.1 mmol/L 3.4  3.3  3.4   Chloride 98 - 111 mmol/L 98  97  97   CO2 22 - 32 mmol/L 30  26  28    Calcium 8.9 - 10.3 mg/dL 8.5  8.3  7.9       Microbiology: 10/25/2023 blood culture 2 out of 2 Streptococcus group G  bacteremia 10/28/2023 blood cultures have been sent 11/01/1623 wound cultures from both legs have been sent    Assessment/Plan: Streptococcus group G bacteremia.  Sudden onset Following an injury to his left leg when he fell through a wooden porch Cellulitis of the leg with wounds bilaterally Severe lymphedema legs with varicose changes Risk for endocarditis is very low Patient is currently on ceftriaxone  Repeat cultures have been sent Echocardiogram was done but that not a good study Wound culture is pending Leukocytosis has resolved If the blood culture remains negative  tomorrow then IV antibiotic can be switched to p.o. amoxicillin  1 g 3 times a day for a total of 14 days.  Until 11/07/2023  Hypertensive urgency was very high this admission and he is started on hydralazine , Coreg , irbesartan , amlodipine  and spironolactone .  AKI on CKD improving  Discussed the management with the patient

## 2023-10-30 ENCOUNTER — Other Ambulatory Visit: Payer: Self-pay

## 2023-10-30 DIAGNOSIS — B957 Other staphylococcus as the cause of diseases classified elsewhere: Secondary | ICD-10-CM

## 2023-10-30 DIAGNOSIS — L03116 Cellulitis of left lower limb: Secondary | ICD-10-CM | POA: Diagnosis not present

## 2023-10-30 DIAGNOSIS — B955 Unspecified streptococcus as the cause of diseases classified elsewhere: Secondary | ICD-10-CM

## 2023-10-30 LAB — BASIC METABOLIC PANEL WITH GFR
Anion gap: 7 (ref 5–15)
BUN: 27 mg/dL — ABNORMAL HIGH (ref 6–20)
CO2: 29 mmol/L (ref 22–32)
Calcium: 8.1 mg/dL — ABNORMAL LOW (ref 8.9–10.3)
Chloride: 101 mmol/L (ref 98–111)
Creatinine, Ser: 1.16 mg/dL (ref 0.61–1.24)
GFR, Estimated: >60 mL/min (ref >60)
Glucose, Bld: 92 mg/dL (ref 70–99)
Potassium: 3.4 mmol/L — ABNORMAL LOW (ref 3.5–5.1)
Sodium: 137 mmol/L (ref 135–145)

## 2023-10-30 LAB — CBC
HCT: 33 % — ABNORMAL LOW (ref 39.0–52.0)
Hemoglobin: 9.7 g/dL — ABNORMAL LOW (ref 13.0–17.0)
MCH: 23 pg — ABNORMAL LOW (ref 26.0–34.0)
MCHC: 29.4 g/dL — ABNORMAL LOW (ref 30.0–36.0)
MCV: 78.2 fL — ABNORMAL LOW (ref 80.0–100.0)
Platelets: 258 K/uL (ref 150–400)
RBC: 4.22 MIL/uL (ref 4.22–5.81)
RDW: 18.6 % — ABNORMAL HIGH (ref 11.5–15.5)
WBC: 7.2 K/uL (ref 4.0–10.5)
nRBC: 0 % (ref 0.0–0.2)

## 2023-10-30 MED ORDER — CLOTRIMAZOLE-BETAMETHASONE 1-0.05 % EX CREA
1.0000 | TOPICAL_CREAM | Freq: Two times a day (BID) | CUTANEOUS | 0 refills | Status: DC
  Filled 2023-10-30: qty 30, 30d supply, fill #0

## 2023-10-30 MED ORDER — VITAMIN D (ERGOCALCIFEROL) 1.25 MG (50000 UNIT) PO CAPS
50000.0000 [IU] | ORAL_CAPSULE | ORAL | 0 refills | Status: DC
  Filled 2023-10-30: qty 5, 35d supply, fill #0

## 2023-10-30 MED ORDER — DOXYCYCLINE HYCLATE 100 MG PO TABS
100.0000 mg | ORAL_TABLET | Freq: Two times a day (BID) | ORAL | Status: DC
Start: 2023-10-30 — End: 2023-10-30

## 2023-10-30 MED ORDER — POTASSIUM CHLORIDE CRYS ER 20 MEQ PO TBCR
40.0000 meq | EXTENDED_RELEASE_TABLET | Freq: Once | ORAL | Status: AC
  Administered 2023-10-30: 40 meq via ORAL
  Filled 2023-10-30: qty 2

## 2023-10-30 MED ORDER — AMOXICILLIN 500 MG PO CAPS
1000.0000 mg | ORAL_CAPSULE | Freq: Three times a day (TID) | ORAL | 0 refills | Status: AC
  Filled 2023-10-30: qty 54, 9d supply, fill #0

## 2023-10-30 MED ORDER — FE FUM-VIT C-VIT B12-FA 460-60-0.01-1 MG PO CAPS
1.0000 | ORAL_CAPSULE | Freq: Every day | ORAL | 0 refills | Status: DC
  Filled 2023-10-30: qty 30, 30d supply, fill #0

## 2023-10-30 MED ORDER — DOXYCYCLINE HYCLATE 100 MG PO CAPS
100.0000 mg | ORAL_CAPSULE | Freq: Two times a day (BID) | ORAL | 0 refills | Status: AC
  Filled 2023-10-30: qty 14, 7d supply, fill #0

## 2023-10-30 NOTE — Progress Notes (Signed)
 Date of Admission:  10/25/2023      ID: Jared Mccoy is a 33 y.o. adult Principal Problem:   Cellulitis of left lower extremity Active Problems:   Sepsis (HCC)   Paroxysmal atrial fibrillation (HCC)   Morbid obesity with BMI of 60.0-69.9, adult (HCC)   Hypertensive urgency   Leg wound, left, initial encounter   Bacteremia due to Streptococcus    Subjective: Pt is feeling okay- wants to go home Has cough but that he says is baseline Is c/o pain head and eyes  Medications:   acetaminophen   650 mg Oral TID   amLODipine   10 mg Oral Daily   aspirin  EC  81 mg Oral Daily   carvedilol   25 mg Oral BID WC   Chlorhexidine  Gluconate Cloth  6 each Topical Daily   clotrimazole -betamethasone   1 Application Topical BID   enoxaparin  (LOVENOX ) injection  120 mg Subcutaneous Q24H   Fe Fum-Vit C-Vit B12-FA  1 capsule Oral QPC breakfast   hydrALAZINE   100 mg Oral Q8H   irbesartan   150 mg Oral Daily   mupirocin  ointment  1 Application Nasal BID   spironolactone   25 mg Oral BID   Vitamin D  (Ergocalciferol )  50,000 Units Oral Q7 days    Objective: Vital signs in last 24 hours: Patient Vitals for the past 24 hrs:  BP Temp Pulse Resp SpO2  10/30/23 1127 (!) 180/95 -- -- -- --  10/30/23 1101 (!) 187/100 98.9 F (37.2 C) 64 18 100 %  10/30/23 0756 (!) 159/95 98.6 F (37 C) 72 19 95 %  10/29/23 2340 (!) 154/84 97.6 F (36.4 C) 77 18 97 %  10/29/23 1948 134/74 97.9 F (36.6 C) 79 16 96 %  10/29/23 1638 (!) 153/86 97.9 F (36.6 C) 70 18 95 %      PHYSICAL EXAM:  General: Alert, cooperative, no distress, morbid obesity Eyes no congestion  Lungs: Clear to auscultation bilaterally. No Wheezing or Rhonchi. No rales. Heart: Regular rate and rhythm, no murmur, rub or gallop. Abdomen: Soft, non-tender,not distended. Bowel sounds normal. No masses Extremities: b/l lymohedema      Skin: No rashes or lesions. Or bruising Lymph: Cervical, supraclavicular normal. Neurologic: Grossly  non-focal  Lab Results    Latest Ref Rng & Units 10/30/2023    4:28 AM 10/29/2023    4:42 AM 10/28/2023    6:25 AM  CBC  WBC 4.0 - 10.5 K/uL 7.2  8.9  11.2   Hemoglobin 13.0 - 17.0 g/dL 9.7  9.9  9.8   Hematocrit 39.0 - 52.0 % 33.0  33.3  32.3   Platelets 150 - 400 K/uL 258  247  196        Latest Ref Rng & Units 10/30/2023    4:28 AM 10/29/2023    4:42 AM 10/28/2023    6:25 AM  CMP  Glucose 70 - 99 mg/dL 92  86  893   BUN 6 - 20 mg/dL 27  38  37   Creatinine 0.61 - 1.24 mg/dL 8.83  8.38  7.99   Sodium 135 - 145 mmol/L 137  137  134   Potassium 3.5 - 5.1 mmol/L 3.4  3.4  3.3   Chloride 98 - 111 mmol/L 101  98  97   CO2 22 - 32 mmol/L 29  30  26    Calcium 8.9 - 10.3 mg/dL 8.1  8.5  8.3       Microbiology: 10/25/2023 blood culture 2 out of  2 Streptococcus group G bacteremia 10/28/2023 blood cultures have been sent 11/01/1623 wound cultures from both legs have been sent    Assessment/Plan: Streptococcus group G bacteremia.  Sudden onset Following an injury to his left leg when he fell through a wooden porch Cellulitis of the leg with wounds bilaterally Severe lymphedema legs with varicose changes Risk for endocarditis is very low Patient is currently on ceftriaxone  Repeat culture neg Echocardiogram was done but that not a good study Wound culture is staph aureus and staph simulans- there is no group G streptococcus  Leukocytosis has resolved Pt does not want any other work up like TEE  IV ceftriaxone   can be switched to p.o. amoxicillin  1 g 3 times a day for a total of 14 days.  Until 11/07/2023 Will do surveillance culture on 11/17/23 at [facility] clinic  Doxycycline  100mg  PO BID for 7 days for staph  wounds b/l legs   Hypertensive urgency was very high this admission and he is started on hydralazine , Coreg , irbesartan , amlodipine  and spironolactone . The headache could be due to high BP  AKI on CKD improving  Discussed the management with the patient, hospitalist and  pharmacist

## 2023-10-30 NOTE — Progress Notes (Addendum)
 [facility] Progress Note  Patient: Jared Mccoy    FMW:[id number]  DOA: 10/25/2023     Date of Service: the patient was seen and examined on 10/30/2023  No chief complaint on file.  Brief hospital course: Jared Mccoy is a 33 y.o. adult with medical history significant for Morbid obesity with suspected OSA, paroxysmal A-fib not on AC, HTN, chronic lower extremity edema (noncardiac), for which he was hospitalized in July  (7/22 to 7/24), and started on diuretics and Unna boots, with being admitted with sepsis secondary from suspected lower extremity cellulitis possibly related to a wound on his left leg . On the day of arrival, he awoke with bodyaches and chills. In the ED Tmax 103 and tachycardic to 106, hypertensive with BP up to 211/112.Labs with WBC 11,000 and lactic acid 1.5.  Creatinine at baseline at 1.36, potassium 3.1, hemoglobin at baseline at 10.9. EKG showed sinus rhythm at 97 Left tib-fib showing soft tissue edema without gas or bony abnormality. Patient unable to get CT due to his weight Patient started on vancomycin  and cefepime . Admission requested.   I assumed care on 10/30/23.   Assessment and Plan:  # Sepsis secondary to Cellulitis of left lower extremity Wound bilateral lower extremities Chronic lymphedema/venous stasis Sepsis criteria include fever and tachycardia with leukocytosis S/p Sepsis fluids, stopped due to worsening of respiratory failure S/p vancomycin  and cefepime  9/14 started ceftriaxone  2 g IV daily and doxycycline  blood cultures growing strep, sensitivity report reviewed  Pain control Keep leg elevated MRSA screen is positive  TTE: LVEF 65 to 70%, no significant valvular abnormality, no vegetations, poor window to visualize valves. 9/15 ID consulted 9/16 follow repeat blood cultures 916 f/u wound culture bilateral lower extremity sent by ID  9/18 -- TEE requested by ID given different organisms in wound cultures vs  blood. 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 Check renal functions and urine output Continue Unna boots  # AKI, most likely due to sepsis.  Resolved 9/18 Cr normalized to 1.16 --Monitor BMP and urine output Avoided IV fluid due to respiratory failure S/p Lasix  given due to respiratory failure and lower extremity edema 9/15 discontinued Lasix  infusion due to worsening of renal functions US  renal: Unremarkable renal ultrasound.  Avoid nephrotoxic medication, use renally dosed medications  sCr 1.92>>2.0 .>1.61 gradually improving 9/15 hold off irbesartan  and Aldactone  for few days Bladder scan shows 148 mL urine, no significant retention Resume held meds tomorrow if Cr stable.  # Hypertensive urgency  Continue home amlodipine , carvedilol , hydralazine  9/15 Held irbesartan  and spironolactone  due to elevated creatinine Monitor BP and titrate medications accordingly    # Paroxysmal atrial fibrillation (HCC) Not currently on anticoagulation, unknown reason.  Recommend to follow with cardiology as an outpatient started aspirin  81 mg p.o. daily  # Vitamin D  deficiency: started vitamin D  50,000 units p.o. weekly, follow with PCP to repeat vitamin D  level after 3 to 6 months.  # Iron deficiency, Tsat 6%, elevated IV iron due to active infection Started oral iron supplement with vitamin C.  Follow-up with PCP to repeat iron profile after 3 to 6 months  # Folic acid  level 7.6, lower end.  Started Trigel supplement # Vitamin B12 level 365, goal >400, started Trigel oral supplement   # Super morbid obesity Body mass index is 69.97  kg/m.  Interventions: Calorie restricted diet and daily exercise advised to lose body weight.  Lifestyle modification discussed.   Diet: Heart healthy diet DVT Prophylaxis: Subcutaneous Lovenox    Advance goals of care discussion: Full code  Family Communication: None present. Pt updated in detail & is capable to update.   Disposition:  Home with [facility] pending completion of Infectious Disease evaluation.  TEE has been requested.    Subjective: Pt was sleeping but woke to voice on rounds this AM.  He reports bilateral eye pain since admission that he reports mentioning a thousand times.  He denies any changes to his vision, he can still see clearly, eyes just hurt.  He isn't able to further describe the pain or if he also has headache.  He said at first thought it was headache but now just eyes.  Confirms multiple times no loss of vision whatsoever.  Denies other acute complaints.  Agreeable to d/c home today if cleared by ID.     Physical Exam: General exam: awake, alert, no acute distress, morbidly obese HEENT: bilateral eyes with faint mild conjunctival injection in the corners, no ocular drainage for fluid noted, visual acuity intact, moist mucus membranes, hearing grossly normal  Respiratory system: CTAB diminished due to body habitus, no wheezes, rales or rhonchi, normal respiratory effort. Cardiovascular system: normal S1/S2, RRR, BLE lymphedema Gastrointestinal system: soft, NT, ND Central nervous system: A&O x3. no gross focal neurologic deficits, normal speech Extremities: BLE lymphedema, wraps clean dry intact on b/l lower legs Psychiatry: normal mood, flat affect, judgement and insight appear normal   Vitals:   10/29/23 2340 10/30/23 0756 10/30/23 1101 10/30/23 1127  BP: (!) 154/84 (!) 159/95 (!) 187/100 (!) 180/95  Pulse: 77 72 64   Resp: 18 19 18    Temp: 97.6 F (36.4 C) 98.6 F (37 C) 98.9 F (37.2 C)   TempSrc:      SpO2: 97% 95% 100%   Weight:      Height:          Data Reviewed:  CBC: Recent Labs  Lab 10/25/23 2021 10/26/23 0916 10/27/23 0435 10/28/23 0625 10/29/23 0442 10/30/23 0428  WBC 11.0* 18.6* 12.5* 11.2* 8.9 7.2  NEUTROABS 9.5*  --   --   --   --   --   HGB 10.9* 10.8* 10.4* 9.8* 9.9* 9.7*  HCT 36.8* 35.9* 34.5* 32.3* 33.3* 33.0*  MCV 78.3* 75.9* 77.4* 76.7* 78.2* 78.2*  PLT 212 205 200 196 247 258   Basic Metabolic Panel: Recent Labs  Lab 10/26/23 0916 10/27/23 0435 10/28/23 0625 10/29/23 0442 10/30/23 0428  NA 136 135 134* 137 137  K 3.4* 3.4* 3.3* 3.4* 3.4*  CL 100 97* 97* 98 101  CO2 26 28 26 30 29   GLUCOSE 105* 111* 106* 86 92  BUN 19 26* 37* 38* 27*  CREATININE 1.73* 1.92* 2.00* 1.61* 1.16  CALCIUM 8.1* 7.9* 8.3* 8.5* 8.1*  MG 1.7 1.8 2.1 2.3  --   PHOS 2.7 3.5 3.6 3.3  --      Time spent: 52 minutes including time at bedside and coordination of care with staff and consultants   Author: Burnard Cunning, DO [facility] 10/30/2023 2:51 PM

## 2023-10-31 ENCOUNTER — Telehealth: Payer: Self-pay | Admitting: *Deleted

## 2023-10-31 ENCOUNTER — Ambulatory Visit (HOSPITAL_COMMUNITY): Admission: RE | Admit: 2023-10-31 | Source: Home / Self Care | Admitting: Oral Surgery

## 2023-10-31 ENCOUNTER — Encounter (HOSPITAL_COMMUNITY): Admission: RE | Payer: Self-pay | Source: Home / Self Care

## 2023-10-31 DIAGNOSIS — Z59819 Housing instability, housed unspecified: Secondary | ICD-10-CM

## 2023-10-31 LAB — AEROBIC CULTURE W GRAM STAIN (SUPERFICIAL SPECIMEN): Gram Stain: NONE SEEN

## 2023-10-31 SURGERY — ECHOCARDIOGRAM, TRANSESOPHAGEAL
Anesthesia: General

## 2023-10-31 SURGERY — DENTAL RESTORATION/EXTRACTIONS
Anesthesia: General

## 2023-10-31 NOTE — Transitions of Care (Post Inpatient/ED Visit) (Signed)
 10/31/2023  Name: Jared Mccoy MRN: [id number] DOB: 1990-07-02  Today's TOC FU Call Status: Today's TOC FU Call Status:: Successful TOC FU Call Completed TOC FU Call Complete Date: 10/31/23 Patient's Name and Date of Birth confirmed.  Transition Care Management Follow-up Telephone Call Date of Discharge: 10/30/23 Discharge Facility: [facility]) Type of Discharge: Inpatient Admission Primary Inpatient Discharge Diagnosis:: Cellulitis of left lower extremity How have you been since you were released from the hospital?: Same Any questions or concerns?: No  Items Reviewed: Did you receive and understand the discharge instructions provided?: Yes Medications obtained,verified, and reconciled?: Yes (Medications Reviewed) Any new allergies since your discharge?: No Dietary orders reviewed?: Yes Type of Diet Ordered:: low sodium heart healthy Do you have support at home?: Yes People in Home [RPT]: sibling(s) Name of Support/Comfort Primary Source: Sister/Tamisha  Medications Reviewed Today: Medications Reviewed Today     Reviewed by Lucky Andrea LABOR, RN (Registered Nurse) on 10/31/23 at [phone]  Med List Status: <None>   Medication Order Taking? Sig Documenting Provider Last Dose Status Informant  acetaminophen  (TYLENOL ) 325 MG tablet [id number] Yes Take 2 tablets (650 mg total) by mouth every 6 (six) hours as needed for mild pain (pain score 1-3) or fever (or Fever >/= 101). Sherrill Cable Parcoal, DO  Active Pharmacy Records, Other  amLODipine  (NORVASC ) 10 MG tablet [id number] Yes Take 1 tablet (10 mg total) by mouth daily. Vannie Reche RAMAN, NP  Active Pharmacy Records, Other  amoxicillin  (AMOXIL ) 500 MG capsule [id number] Yes Take 2 capsules (1,000 mg total) by mouth 3 (three) times daily for 9 days. Fayette Bodily, MD  Active   carvedilol  (COREG ) 25 MG tablet [id number] Yes Take 1 tablet (25 mg total) by mouth 2 (two) times daily with a meal. Vannie Reche RAMAN, NP  Active Pharmacy Records, Other  clotrimazole -betamethasone  (LOTRISONE ) cream [id number] Yes Apply 1 Application topically 2 (two) times daily. Fausto Sor A, DO  Active   doxycycline  (VIBRAMYCIN ) 100 MG capsule [id number] Yes Take 1 capsule (100 mg total) by mouth 2 (two) times daily for 7 days. Fayette Bodily, MD  Active   Fe Fum-Vit C-Vit B12-FA (TRIGELS-F FORTE) CAPS capsule [id number] Yes Take 1 capsule by mouth daily after breakfast. Fausto Sor LABOR, DO  Active   hydrALAZINE  (APRESOLINE ) 100 MG tablet [id number] Yes Take 1 tablet (100 mg total) by mouth every 8 (eight) hours. Vannie Reche RAMAN, NP  Active Pharmacy Records, Other  irbesartan  (AVAPRO ) 150 MG tablet [id number] Yes Take 1 tablet (150 mg total) by mouth daily. Vannie Reche RAMAN, NP  Active Pharmacy Records, Other  spironolactone  (ALDACTONE ) 25 MG tablet [id number] Yes Take 1 tablet (25 mg total) by mouth 2 (two) times daily. Vannie Reche RAMAN, NP  Active Pharmacy Records, Other  Vitamin D , Ergocalciferol , (DRISDOL ) 1.25 MG (50000 UNIT) CAPS capsule [id number] Yes Take 1 capsule (50,000 Units total) by mouth every 7 (seven) days. Fausto Sor LABOR, DO  Active           RNCM advised patient to pick up refills for spironolactone , irbesartan , hydralazine , carvedilol  and amlodipine  prior to running out. Last dispense date was in July for a 30 day supply. Patient reports having some and taking as directed.  Home Care and Equipment/Supplies: Were Home Health Services Ordered?: Yes Name of Home Health Agency:: [facility] Has Agency set up a time to come to your home?: No (RNCM verified with [facility] [phone], SOC planned for 11/01/23) EMR reviewed for Home Health Orders:  Orders present/patient has not received call (refer to CM for follow-up) Any new equipment or medical supplies ordered?: Yes Name of Medical supply agency?: [facility] delivered 3n1 to hospital Were you able to get the equipment/medical  supplies?: Yes Do you have any questions related to the use of the equipment/supplies?: No  Functional Questionnaire: Do you need assistance with bathing/showering or dressing?: No Do you need assistance with meal preparation?: No Do you need assistance with eating?: No Do you have difficulty maintaining continence: No Do you need assistance with getting out of bed/getting out of a chair/moving?: No Do you have difficulty managing or taking your medications?: No  Follow up appointments reviewed: PCP Follow-up appointment confirmed?: No (Patient prefers to scheduled himself via MyChart) MD Provider Line Number:[phone] Given: No Specialist Hospital Follow-up appointment confirmed?: Yes Date of Specialist follow-up appointment?: 11/03/23 Follow-Up Specialty Provider:: Wound Care with Delon Bohr Do you need transportation to your follow-up appointment?: No Do you understand care options if your condition(s) worsen?: Yes-patient verbalized understanding  SDOH Interventions Today    Flowsheet Row Most Recent Value  SDOH Interventions   Food Insecurity Interventions Intervention Not Indicated  Housing Interventions Other (Comment)  [Referral to VBCI SW for housing resources]  Transportation Interventions Intervention Not Indicated  Utilities Interventions Intervention Not Indicated   Patient became frustrated during the assessment and declined to continue.  Andrea Dimes RN, BSN [place]  [facility] RN Care Manager [phone]

## 2023-11-01 DIAGNOSIS — I480 Paroxysmal atrial fibrillation: Secondary | ICD-10-CM | POA: Diagnosis not present

## 2023-11-01 DIAGNOSIS — I890 Lymphedema, not elsewhere classified: Secondary | ICD-10-CM | POA: Diagnosis not present

## 2023-11-01 DIAGNOSIS — I160 Hypertensive urgency: Secondary | ICD-10-CM | POA: Diagnosis not present

## 2023-11-01 DIAGNOSIS — Z556 Problems related to health literacy: Secondary | ICD-10-CM | POA: Diagnosis not present

## 2023-11-01 DIAGNOSIS — E785 Hyperlipidemia, unspecified: Secondary | ICD-10-CM | POA: Diagnosis not present

## 2023-11-01 DIAGNOSIS — I739 Peripheral vascular disease, unspecified: Secondary | ICD-10-CM | POA: Diagnosis not present

## 2023-11-01 DIAGNOSIS — E662 Morbid (severe) obesity with alveolar hypoventilation: Secondary | ICD-10-CM | POA: Diagnosis not present

## 2023-11-01 DIAGNOSIS — A419 Sepsis, unspecified organism: Secondary | ICD-10-CM | POA: Diagnosis not present

## 2023-11-01 DIAGNOSIS — I119 Hypertensive heart disease without heart failure: Secondary | ICD-10-CM | POA: Diagnosis not present

## 2023-11-01 DIAGNOSIS — I872 Venous insufficiency (chronic) (peripheral): Secondary | ICD-10-CM | POA: Diagnosis not present

## 2023-11-01 DIAGNOSIS — Z6841 Body Mass Index (BMI) 40.0 and over, adult: Secondary | ICD-10-CM | POA: Diagnosis not present

## 2023-11-01 DIAGNOSIS — L03116 Cellulitis of left lower limb: Secondary | ICD-10-CM | POA: Diagnosis not present

## 2023-11-01 DIAGNOSIS — F17210 Nicotine dependence, cigarettes, uncomplicated: Secondary | ICD-10-CM | POA: Diagnosis not present

## 2023-11-02 LAB — CULTURE, BLOOD (ROUTINE X 2)
Culture: NO GROWTH
Culture: NO GROWTH
Special Requests: ADEQUATE
Special Requests: ADEQUATE

## 2023-11-03 ENCOUNTER — Encounter (HOSPITAL_BASED_OUTPATIENT_CLINIC_OR_DEPARTMENT_OTHER): Attending: General Surgery | Admitting: General Surgery

## 2023-11-03 DIAGNOSIS — I890 Lymphedema, not elsewhere classified: Secondary | ICD-10-CM | POA: Insufficient documentation

## 2023-11-03 DIAGNOSIS — L97822 Non-pressure chronic ulcer of other part of left lower leg with fat layer exposed: Secondary | ICD-10-CM | POA: Diagnosis not present

## 2023-11-03 DIAGNOSIS — E662 Morbid (severe) obesity with alveolar hypoventilation: Secondary | ICD-10-CM | POA: Diagnosis not present

## 2023-11-03 DIAGNOSIS — L97812 Non-pressure chronic ulcer of other part of right lower leg with fat layer exposed: Secondary | ICD-10-CM | POA: Insufficient documentation

## 2023-11-04 ENCOUNTER — Telehealth: Payer: Self-pay | Admitting: *Deleted

## 2023-11-04 NOTE — Progress Notes (Signed)
 Complex Care Management Note  Care Guide Note 11/04/2023 Name: Jared Mccoy MRN: [id number] DOB: Aug 26, 1990  Jared Mccoy is a 34 y.o. year old adult who sees Celestia Rosaline SQUIBB, NP for primary care. I reached out to Fairy JONETTA Solian by phone today to offer complex care management services.  [name] was given information about Complex Care Management services today including:   The Complex Care Management services include support from the care team which includes your Nurse Care Manager, Clinical Social Worker, or Pharmacist.  The Complex Care Management team is here to help remove barriers to the health concerns and goals most important to you. Complex Care Management services are voluntary, and the patient may decline or stop services at any time by request to their care team member.   Complex Care Management Consent Status: Patient agreed to services and verbal consent obtained.   Follow up plan:  Telephone appointment with complex care management team member scheduled for:  9/30 with RNCM and 11/07/23 with BSW   Encounter Outcome:  Patient Scheduled  Harlene Satterfield  [facility]  [facility], [facility] Guide  Direct Dial: [phone]  Fax [phone]

## 2023-11-05 DIAGNOSIS — I160 Hypertensive urgency: Secondary | ICD-10-CM | POA: Diagnosis not present

## 2023-11-05 DIAGNOSIS — I872 Venous insufficiency (chronic) (peripheral): Secondary | ICD-10-CM | POA: Diagnosis not present

## 2023-11-05 DIAGNOSIS — Z6841 Body Mass Index (BMI) 40.0 and over, adult: Secondary | ICD-10-CM | POA: Diagnosis not present

## 2023-11-05 DIAGNOSIS — E662 Morbid (severe) obesity with alveolar hypoventilation: Secondary | ICD-10-CM | POA: Diagnosis not present

## 2023-11-05 DIAGNOSIS — I480 Paroxysmal atrial fibrillation: Secondary | ICD-10-CM | POA: Diagnosis not present

## 2023-11-05 DIAGNOSIS — L03116 Cellulitis of left lower limb: Secondary | ICD-10-CM | POA: Diagnosis not present

## 2023-11-05 DIAGNOSIS — A419 Sepsis, unspecified organism: Secondary | ICD-10-CM | POA: Diagnosis not present

## 2023-11-05 DIAGNOSIS — I890 Lymphedema, not elsewhere classified: Secondary | ICD-10-CM | POA: Diagnosis not present

## 2023-11-05 DIAGNOSIS — Z556 Problems related to health literacy: Secondary | ICD-10-CM | POA: Diagnosis not present

## 2023-11-05 DIAGNOSIS — E785 Hyperlipidemia, unspecified: Secondary | ICD-10-CM | POA: Diagnosis not present

## 2023-11-05 DIAGNOSIS — F17210 Nicotine dependence, cigarettes, uncomplicated: Secondary | ICD-10-CM | POA: Diagnosis not present

## 2023-11-05 DIAGNOSIS — I739 Peripheral vascular disease, unspecified: Secondary | ICD-10-CM | POA: Diagnosis not present

## 2023-11-05 DIAGNOSIS — I119 Hypertensive heart disease without heart failure: Secondary | ICD-10-CM | POA: Diagnosis not present

## 2023-11-06 ENCOUNTER — Other Ambulatory Visit (HOSPITAL_COMMUNITY): Payer: Self-pay

## 2023-11-06 ENCOUNTER — Institutional Professional Consult (permissible substitution) (HOSPITAL_BASED_OUTPATIENT_CLINIC_OR_DEPARTMENT_OTHER): Admitting: Family

## 2023-11-06 ENCOUNTER — Encounter (HOSPITAL_BASED_OUTPATIENT_CLINIC_OR_DEPARTMENT_OTHER): Payer: Self-pay

## 2023-11-06 DIAGNOSIS — F17210 Nicotine dependence, cigarettes, uncomplicated: Secondary | ICD-10-CM | POA: Diagnosis not present

## 2023-11-06 DIAGNOSIS — Z556 Problems related to health literacy: Secondary | ICD-10-CM | POA: Diagnosis not present

## 2023-11-06 DIAGNOSIS — I119 Hypertensive heart disease without heart failure: Secondary | ICD-10-CM | POA: Diagnosis not present

## 2023-11-06 DIAGNOSIS — I890 Lymphedema, not elsewhere classified: Secondary | ICD-10-CM | POA: Diagnosis not present

## 2023-11-06 DIAGNOSIS — I739 Peripheral vascular disease, unspecified: Secondary | ICD-10-CM | POA: Diagnosis not present

## 2023-11-06 DIAGNOSIS — L03116 Cellulitis of left lower limb: Secondary | ICD-10-CM | POA: Diagnosis not present

## 2023-11-06 DIAGNOSIS — I160 Hypertensive urgency: Secondary | ICD-10-CM | POA: Diagnosis not present

## 2023-11-06 DIAGNOSIS — Z6841 Body Mass Index (BMI) 40.0 and over, adult: Secondary | ICD-10-CM | POA: Diagnosis not present

## 2023-11-06 DIAGNOSIS — I872 Venous insufficiency (chronic) (peripheral): Secondary | ICD-10-CM | POA: Diagnosis not present

## 2023-11-06 DIAGNOSIS — I480 Paroxysmal atrial fibrillation: Secondary | ICD-10-CM | POA: Diagnosis not present

## 2023-11-06 DIAGNOSIS — E785 Hyperlipidemia, unspecified: Secondary | ICD-10-CM | POA: Diagnosis not present

## 2023-11-06 DIAGNOSIS — A419 Sepsis, unspecified organism: Secondary | ICD-10-CM | POA: Diagnosis not present

## 2023-11-06 DIAGNOSIS — E662 Morbid (severe) obesity with alveolar hypoventilation: Secondary | ICD-10-CM | POA: Diagnosis not present

## 2023-11-07 ENCOUNTER — Other Ambulatory Visit: Payer: Self-pay

## 2023-11-07 NOTE — Patient Outreach (Signed)
 Complex Care Management   Visit Note  11/07/2023  Name:  Jared Mccoy MRN: [id number] DOB: 07-23-1990  Situation: Referral received for Complex Care Management related to SDOH Barriers:  Housing   I obtained verbal consent from Patient.  Visit completed with Patient  on the phone  Background:   Past Medical History:  Diagnosis Date   Hypertension    not on medication   Obese    Peripheral vascular disease    edema in legs     Assessment:  BSW spoke with patient today to assess for SDOH barriers. During the call, BSW determined that housing and financial resource strain are barriers for this patient. Patient stated that he is currently living in his mother's home with his three children after being asked to leave his previous housing. Patient reported that the person he was renting from was also leasing the property and had failed to pay their lease, resulting in everyone having to vacate. Patient indicated that his current living arrangement with his mother is temporary, and he would like to secure independent housing for himself and his children as soon as possible. BSW will send the patient a list of local housing resources, housing authorities in surrounding areas, and family support programs that may help him find affordable housing and assist with his children's needs. BSW will continue to provide support as needed and encouraged patient to reach out if further assistance is required. BSW will follow up with this patient on October 21 at 10 AM. Housing Authorities -[facility] - [address] [place], [place], [place] [address]  [phone] -[facility] - [address] [place], [place] [address]  [phone] [facility] - [address], [place], [place] [address]  [phone] [facility] Authority - [address], [place], [place] [address]  [phone] [facility] Authority - [address] [place], [place] [address]  [phone] [facility] Authority - [address] [place], [place], [place] [address]  [phone] -[facility] Authority ([place] - [address] [place], [place] [address]  [phone]  Affordable Housing & Rental Assistance  [place] ([place] Archivist)  Holiday representative of [employer]	[place] [employer]  [place] [employer]  [employer] [place] - 2-1-1   Resources for Children  -[facility] Department of Social Services (DSS) - Apply for childcare vouchers and family support  -Haematologist  -[place] [employer] - Head Start and Early [employer] programs  -[employer] [place] -[employer] of [place]  SDOH Interventions Today    Flowsheet Row Most Recent Value  SDOH Interventions   Financial Strain Interventions Community Resources Provided      Recommendation:   No recommendations at this time.  Follow Up Plan:   Telephone follow-up 12/02/2023 at 10am  Orlean Fey, BSW [place]  [facility] Social Worker, [facility] [phone]

## 2023-11-07 NOTE — Patient Instructions (Signed)
 Visit Information  Thank you for taking time to visit with me today. Please don't hesitate to contact me if I can be of assistance to you before our next scheduled appointment.  Our next appointment is by telephone on 12/02/2023 at 10am Please call the care guide team at [phone] if you need to cancel or reschedule your appointment.     Please call the Suicide and Crisis Lifeline: 988 call the [place]  National Suicide Prevention Lifeline: 2021043776 or TTY: (825)092-3894 TTY [phone]) to talk to a trained counselor call 1-800-273-TALK (toll free, 24 hour hotline) go to [facility] [address] [place] [phone]) call 911 if you are experiencing a Mental Health or Behavioral Health Crisis or need someone to talk to.  Patient verbalizes understanding of instructions and care plan provided today and agrees to view in MyChart. Active MyChart status and patient understanding of how to access instructions and care plan via MyChart confirmed with patient.     Orlean Fey, BSW [place]  [facility] Social Worker, [facility] [phone]

## 2023-11-10 ENCOUNTER — Ambulatory Visit (HOSPITAL_BASED_OUTPATIENT_CLINIC_OR_DEPARTMENT_OTHER): Admitting: General Surgery

## 2023-11-11 ENCOUNTER — Other Ambulatory Visit: Payer: Self-pay | Admitting: *Deleted

## 2023-11-11 ENCOUNTER — Other Ambulatory Visit: Payer: Self-pay

## 2023-11-11 ENCOUNTER — Other Ambulatory Visit (HOSPITAL_COMMUNITY): Payer: Self-pay

## 2023-11-11 DIAGNOSIS — L03116 Cellulitis of left lower limb: Secondary | ICD-10-CM | POA: Diagnosis not present

## 2023-11-11 DIAGNOSIS — I160 Hypertensive urgency: Secondary | ICD-10-CM | POA: Diagnosis not present

## 2023-11-11 DIAGNOSIS — E662 Morbid (severe) obesity with alveolar hypoventilation: Secondary | ICD-10-CM | POA: Diagnosis not present

## 2023-11-11 DIAGNOSIS — Z556 Problems related to health literacy: Secondary | ICD-10-CM | POA: Diagnosis not present

## 2023-11-11 DIAGNOSIS — Z6841 Body Mass Index (BMI) 40.0 and over, adult: Secondary | ICD-10-CM | POA: Diagnosis not present

## 2023-11-11 DIAGNOSIS — A419 Sepsis, unspecified organism: Secondary | ICD-10-CM | POA: Diagnosis not present

## 2023-11-11 DIAGNOSIS — I119 Hypertensive heart disease without heart failure: Secondary | ICD-10-CM | POA: Diagnosis not present

## 2023-11-11 DIAGNOSIS — I872 Venous insufficiency (chronic) (peripheral): Secondary | ICD-10-CM | POA: Diagnosis not present

## 2023-11-11 DIAGNOSIS — E785 Hyperlipidemia, unspecified: Secondary | ICD-10-CM | POA: Diagnosis not present

## 2023-11-11 DIAGNOSIS — I890 Lymphedema, not elsewhere classified: Secondary | ICD-10-CM | POA: Diagnosis not present

## 2023-11-11 DIAGNOSIS — F17210 Nicotine dependence, cigarettes, uncomplicated: Secondary | ICD-10-CM | POA: Diagnosis not present

## 2023-11-11 DIAGNOSIS — I739 Peripheral vascular disease, unspecified: Secondary | ICD-10-CM | POA: Diagnosis not present

## 2023-11-11 DIAGNOSIS — I480 Paroxysmal atrial fibrillation: Secondary | ICD-10-CM | POA: Diagnosis not present
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
## 2023-11-11 NOTE — Patient Instructions (Signed)
 Visit Information  Thank you for taking time to visit with me today. Please don't hesitate to contact me if I can be of assistance to you before our next scheduled appointment.  Your next care management appointment is by telephone on 11/25/23  at 9:30 am  Please call the care guide team at [phone] if you need to cancel, schedule, or reschedule an appointment.   Please call the Suicide and Crisis Lifeline: 988 call the [place]  National Suicide Prevention Lifeline: 825-480-6482 or TTY: 5160814090 TTY 587-589-4960) to talk to a trained counselor call 1-800-273-TALK (toll free, 24 hour hotline) go to [facility] [address], [place] [phone]) call 911 if you are experiencing a Mental Health or Behavioral Health Crisis or need someone to talk to.  Harmoni Lucus, RN, BSN, Theatre manager [employer] [phone]

## 2023-11-12 ENCOUNTER — Encounter (INDEPENDENT_AMBULATORY_CARE_PROVIDER_SITE_OTHER): Payer: Self-pay

## 2023-11-12 ENCOUNTER — Other Ambulatory Visit: Payer: Self-pay | Admitting: Pharmacist

## 2023-11-12 ENCOUNTER — Other Ambulatory Visit: Payer: Self-pay

## 2023-11-12 DIAGNOSIS — I10 Essential (primary) hypertension: Secondary | ICD-10-CM

## 2023-11-12 MED ORDER — IRBESARTAN 150 MG PO TABS
150.0000 mg | ORAL_TABLET | Freq: Every day | ORAL | 0 refills | Status: DC
  Filled 2023-11-12: qty 90, 90d supply, fill #0

## 2023-11-12 NOTE — Progress Notes (Signed)
 Pharmacy Quality Measure Review  This patient is appearing on a report for being at risk of failing the adherence measure for hypertension (ACEi/ARB) medications this calendar year.   Medication: irbesartan  Last fill date: 11/07/2023 for 30 day supply  Insurance report was not up to date. No action needed at this time. Reminder set for next fill.   Herlene Fleeta Morris, PharmD, JAQUELINE, CPP Clinical Pharmacist [facility] [phone]

## 2023-11-13 NOTE — Discharge Summary (Signed)
 Physician Discharge Summary   Patient: Jared Mccoy MRN: [id number] DOB: December 25, 1990  Admit date:     10/25/2023  Discharge date: 10/30/2023  Discharge Physician: Burnard DELENA Cunning   PCP: Celestia Rosaline SQUIBB, NP   Recommendations at discharge:    Follow up in ID clinic 10/6 Follow up with primary Care in1-2 weeks Repeat Blood Cultures 10/6 for bacteremia surveillance Repeat CBC, CMP at follow up Continue wound care and close monitoring  Discharge Diagnoses: Principal Problem:   Cellulitis of left lower extremity Active Problems:   Sepsis (HCC)   Leg wound, left, initial encounter   Hypertensive urgency   Morbid obesity with BMI of 60.0-69.9, adult (HCC)   Paroxysmal atrial fibrillation (HCC)   Bacteremia due to Streptococcus  Resolved Problems:   * No resolved hospital problems. *  Hospital Course:  Jared Mccoy is a 33 y.o. adult with medical history significant for Morbid obesity with suspected OSA, paroxysmal A-fib not on AC, HTN, chronic lower extremity edema (noncardiac), for which he was hospitalized in July  (7/22 to 7/24), and started on diuretics and Unna boots, with being admitted with sepsis secondary from suspected lower extremity cellulitis possibly related to a wound on his left leg . On the day of arrival, he awoke with bodyaches and chills. In the ED Tmax 103 and tachycardic to 106, hypertensive with BP up to 211/112.Labs with WBC 11,000 and lactic acid 1.5.  Creatinine at baseline at 1.36, potassium 3.1, hemoglobin at baseline at 10.9. EKG showed sinus rhythm at 97 Left tib-fib showing soft tissue edema without gas or bony abnormality. Patient unable to get CT due to his weight Patient started on vancomycin  and cefepime . Admission requested.    I assumed care today, 10/30/23. Patient requesting to go home today.  Patient was seen by ID and reiterated wanting to go home.  He declined to have TEE or other evaluation.  ID recommended transition to PO  amoxicillin  1 g TID for total 14 days, through 11/07/23 and followup in [place] ID clinic 11/17/23 with surveillance cultures planned. Also recommended Doxycyline 100 PO BID x 7 days for b/l leg wounds.  Patient is requesting to go home today, declines further inpatient evaluation.  He is medically stable for discharge.    Assessment and Plan:  # Sepsis secondary to Cellulitis of left lower extremity Wound bilateral lower extremities Chronic lymphedema/venous stasis Sepsis criteria include fever and tachycardia with leukocytosis S/p Sepsis fluids, stopped due to worsening of respiratory failure S/p vancomycin  and cefepime  9/14 started ceftriaxone  2 g IV daily and doxycycline  blood cultures growing strep, sensitivity report reviewed  Pain control Keep leg elevated MRSA screen is positive  TTE: LVEF 65 to 70%, no significant valvular abnormality, no vegetations, poor window to visualize valves. 9/15 ID consulted 9/16 follow repeat blood cultures 916 f/u wound culture bilateral lower extremity sent by ID   9/18 -- TEE requested by ID given different organisms in wound cultures vs blood. 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 Check renal functions and urine output Continue Unna boots   # AKI, most likely  due to sepsis.  Resolved 9/18 Cr normalized to 1.16 --Monitor BMP and urine output Avoided IV fluid due to respiratory failure S/p Lasix  given due to respiratory failure and lower extremity edema 9/15 discontinued Lasix  infusion due to worsening of  renal functions US  renal: Unremarkable renal ultrasound.  Avoid nephrotoxic medication, use renally dosed medications  sCr 1.92>>2.0 .>1.61 gradually improving 9/15 hold off irbesartan  and Aldactone  for few days Bladder scan shows 148 mL urine, no significant retention Resume held meds tomorrow if Cr stable.   # Hypertensive urgency  Continue home amlodipine , carvedilol , hydralazine  9/15 Held irbesartan  and spironolactone  due to elevated creatinine Monitor BP and titrate medications accordingly     # Paroxysmal atrial fibrillation (HCC) Not currently on anticoagulation, unknown reason.  Recommend to follow with cardiology as an outpatient started aspirin  81 mg p.o. daily   # Vitamin D  deficiency: started vitamin D  50,000 units p.o. weekly, follow with PCP to repeat vitamin D  level after 3 to 6 months.   # Iron deficiency, Tsat 6%, elevated IV iron due to active infection Started oral iron supplement with vitamin C.  Follow-up with PCP to repeat iron profile after 3 to 6 months   # Folic acid  level 7.6, lower end.  Started Trigel supplement # Vitamin B12 level 365, goal >400, started Trigel oral supplement     # Super morbid obesity Body mass index is 69.97 kg/m.  Interventions: Calorie restricted diet and daily exercise advised to lose body weight.  Lifestyle modification discussed.         Consultants: ID, wound care,PCCM Procedures performed: as above  Disposition: Home health Diet recommendation:  Discharge Diet Orders (From admission, onward)     Start     Ordered   10/30/23 0000  Diet - low sodium heart healthy        10/30/23 1659            DISCHARGE MEDICATION: Allergies as of 10/30/2023       Reactions   Crab (diagnostic) Diarrhea, Nausea And Vomiting   Reaction only to crab legs         Medication List     STOP taking these medications    cephALEXin  500 MG capsule Commonly known as: KEFLEX    ondansetron  4 MG tablet Commonly known as:  ZOFRAN    semaglutide -weight management 0.5 MG/0.5ML Soaj SQ injection Commonly known as: WEGOVY    semaglutide -weight management 1 MG/0.5ML Soaj SQ injection Commonly known as: WEGOVY    semaglutide -weight management 1.7 MG/0.75ML Soaj SQ injection Commonly known as: WEGOVY    semaglutide -weight management 2.4 MG/0.75ML Soaj SQ injection Commonly known as: WEGOVY        TAKE these medications    acetaminophen  325 MG tablet Commonly known as: TYLENOL  Take 2 tablets (650 mg total) by mouth every 6 (six) hours as needed for mild pain (pain score 1-3) or fever (or Fever >/= 101).   amLODipine  10 MG tablet Commonly known as: NORVASC  Take 1 tablet (10 mg total) by mouth daily.   carvedilol  25 MG tablet Commonly known as: COREG  Take 1 tablet (25 mg total) by mouth 2 (two) times daily with a meal.   clotrimazole -betamethasone  cream Commonly known as: LOTRISONE  Apply 1 Application topically 2 (two) times daily.   hydrALAZINE  100 MG tablet Commonly known as: APRESOLINE  Take 1 tablet (100 mg total) by mouth every 8 (eight) hours.   spironolactone  25 MG tablet Commonly known as: ALDACTONE  Take 1 tablet (25 mg total) by mouth 2 (two) times daily.  Trigels-F Forte Caps capsule Generic drug: Fe Fum-Vit C-Vit B12-FA Take 1 capsule by mouth daily after breakfast.   Vitamin D  (Ergocalciferol ) 1.25 MG (50000 UNIT) Caps capsule Commonly known as: DRISDOL  Take 1 capsule (50,000 Units total) by mouth every 7 (seven) days.       ASK your doctor about these medications    amoxicillin  500 MG capsule Commonly known as: AMOXIL  Take 2 capsules (1,000 mg total) by mouth 3 (three) times daily for 9 days. Ask about: Should I take this medication?   doxycycline  100 MG capsule Commonly known as: VIBRAMYCIN  Take 1 capsule (100 mg total) by mouth 2 (two) times daily for 7 days. Ask about: Should I take this medication?               Discharge Care Instructions  (From admission,  onward)           Start     Ordered   10/30/23 0000  Discharge wound care:       Comments: Cleanse both lower leg wounds with Vashe wound cleanser  Do not rinse and allow to air dry.  Apply silver hydrofiber cut to fit wound beds daily, cover with ABD pads and secure with Kerlix roll gauze beginning right above toes and ending right below knees.  Apply Ace bandage wrapped in same fashion as Kerlix for light compression.   10/30/23 1659            Follow-up Information     [facility]  Follow up.   Why: They will follow up with you for your home health needs. Contact information: [address] [place] [address] [phone]                Discharge Exam: Jared Mccoy   10/25/23 1907 10/25/23 1910  Weight: (!) 235.9 kg (!) 234 kg   General exam: awake, alert, no acute distress, morbidly obese HEENT: bilateral eyes with faint mild conjunctival injection in the corners, no ocular drainage for fluid noted, visual acuity intact, moist mucus membranes, hearing grossly normal  Respiratory system: CTAB diminished due to body habitus, no wheezes, rales or rhonchi, normal respiratory effort. Cardiovascular system: normal S1/S2, RRR, BLE lymphedema Gastrointestinal system: soft, NT, ND Central nervous system: A&O x3. no gross focal neurologic deficits, normal speech Extremities: BLE lymphedema, wraps clean dry intact on b/l lower legs Psychiatry: normal mood, flat affect, judgement and insight appear normal  Condition at discharge: stable  The results of significant diagnostics from this hospitalization (including imaging, microbiology, ancillary and laboratory) are listed below for reference.   Imaging Studies: ECHOCARDIOGRAM COMPLETE Result Date: 10/27/2023    ECHOCARDIOGRAM REPORT   Patient Name:   JOSEFF LUCKMAN Date of Exam: 10/27/2023 Medical Rec #:  [id number]       Height:       72.0 in Accession #:    [id number]      Weight:        515.9 lb Date of Birth:  April 04, 1990        BSA:          3.187 m Patient Age:    33 years        BP:           148/78 mmHg Patient Gender: M               HR:           76 bpm. Exam Location:  [facility] Procedure: 2D  Echo, Color Doppler, Cardiac Doppler and Intracardiac            Opacification Agent (Both Spectral and Color Flow Doppler were            utilized during procedure). Indications:     Endocarditis I38  History:         Patient has prior history of Echocardiogram examinations, most                  recent 07/13/2023. Signs/Symptoms:Bacteremia.  Sonographer:     Rosina Dunk Referring Phys:  [id number] ELVAN SOR Diagnosing Phys: Deatrice Cage MD  Sonographer Comments: Image acquisition challenging due to patient body habitus. IMPRESSIONS  1. Left ventricular ejection fraction, by estimation, is 65 to 70%. The left ventricle has normal function. The left ventricle has no regional wall motion abnormalities. The left ventricular internal cavity size was mildly dilated. There is moderate left ventricular hypertrophy. Left ventricular diastolic parameters are indeterminate.  2. Right ventricular systolic function is normal. The right ventricular size is normal.  3. Left atrial size was moderately dilated.  4. Right atrial size was mildly dilated.  5. The mitral valve is normal in structure. No evidence of mitral valve regurgitation. No evidence of mitral stenosis.  6. The aortic valve is normal in structure. Aortic valve regurgitation is not visualized. No aortic stenosis is present.  7. Aortic dilatation noted. There is mild dilatation of the aortic root, measuring 40 mm.  8. The inferior vena cava is dilated in size with >50% respiratory variability, suggesting right atrial pressure of 8 mmHg.  9. No clear vegetations but most valves were not well visualized. Poor image quality. FINDINGS  Left Ventricle: Left ventricular ejection fraction, by estimation, is 65 to 70%. The left ventricle has normal  function. The left ventricle has no regional wall motion abnormalities. Definity  contrast agent was given IV to delineate the left ventricular  endocardial borders. The left ventricular internal cavity size was mildly dilated. There is moderate left ventricular hypertrophy. Left ventricular diastolic parameters are indeterminate. Right Ventricle: The right ventricular size is normal. No increase in right ventricular wall thickness. Right ventricular systolic function is normal. Left Atrium: Left atrial size was moderately dilated. Right Atrium: Right atrial size was mildly dilated. Pericardium: There is no evidence of pericardial effusion. Mitral Valve: The mitral valve is normal in structure. No evidence of mitral valve regurgitation. No evidence of mitral valve stenosis. MV peak gradient, 4.3 mmHg. The mean mitral valve gradient is 2.0 mmHg. Tricuspid Valve: The tricuspid valve is normal in structure. Tricuspid valve regurgitation is not demonstrated. No evidence of tricuspid stenosis. Aortic Valve: The aortic valve is normal in structure. Aortic valve regurgitation is not visualized. No aortic stenosis is present. Aortic valve mean gradient measures 6.0 mmHg. Aortic valve peak gradient measures 12.7 mmHg. Aortic valve area, by VTI measures 3.61 cm. Pulmonic Valve: The pulmonic valve was normal in structure. Pulmonic valve regurgitation is trivial. No evidence of pulmonic stenosis. Aorta: Aortic dilatation noted. There is mild dilatation of the aortic root, measuring 40 mm. Venous: The inferior vena cava is dilated in size with greater than 50% respiratory variability, suggesting right atrial pressure of 8 mmHg. IAS/Shunts: No atrial level shunt detected by color flow Doppler.  LEFT VENTRICLE PLAX 2D LVIDd:         6.30 cm      Diastology LVIDs:         3.40 cm      LV e' medial:  6.96 cm/s LV PW:         1.70 cm      LV e' lateral: 9.36 cm/s LV IVS:        1.60 cm LVOT diam:     2.45 cm LV SV:         107 LV SV  Index:   34 LVOT Area:     4.71 cm  LV Volumes (MOD) LV vol d, MOD A2C: 131.0 ml LV vol d, MOD A4C: 189.0 ml LV vol s, MOD A2C: 17.0 ml LV vol s, MOD A4C: 43.7 ml LV SV MOD A2C:     114.0 ml LV SV MOD A4C:     189.0 ml LV SV MOD BP:      131.2 ml RIGHT VENTRICLE             IVC RV S prime:     14.60 cm/s  IVC diam: 2.80 cm TAPSE (M-mode): 2.4 cm LEFT ATRIUM              Index        RIGHT ATRIUM           Index LA diam:        4.70 cm  1.47 cm/m   RA Area:     21.20 cm LA Vol (A2C):   134.0 ml 42.05 ml/m  RA Volume:   64.20 ml  20.14 ml/m LA Vol (A4C):   96.8 ml  30.37 ml/m LA Biplane Vol: 116.0 ml 36.40 ml/m  AORTIC VALVE                     PULMONIC VALVE AV Area (Vmax):    3.68 cm      PV Vmax:        0.94 m/s AV Area (Vmean):   3.61 cm      PV Vmean:       62.700 cm/s AV Area (VTI):     3.61 cm      PV VTI:         0.152 m AV Vmax:           178.00 cm/s   PV Peak grad:   3.5 mmHg AV Vmean:          115.000 cm/s  PV Mean grad:   2.0 mmHg AV VTI:            0.298 m       RVOT Peak grad: 4 mmHg AV Peak Grad:      12.7 mmHg AV Mean Grad:      6.0 mmHg LVOT Vmax:         139.00 cm/s LVOT Vmean:        88.100 cm/s LVOT VTI:          0.228 m LVOT/AV VTI ratio: 0.77  AORTA Ao Root diam: 4.00 cm Ao Asc diam:  3.60 cm MITRAL VALVE MV Area VTI:  4.24 cm   SHUNTS MV Peak grad: 4.3 mmHg   Systemic VTI:  0.23 m MV Mean grad: 2.0 mmHg   Systemic Diam: 2.45 cm MV Vmax:      1.04 m/s   Pulmonic VTI:  0.173 m MV Vmean:     66.0 cm/s Deatrice Cage MD Electronically signed by Deatrice Cage MD Signature Date/Time: 10/27/2023/1:33:18 PM    Final    US  RENAL Result Date: 10/26/2023 CLINICAL DATA:  Acute renal insufficiency. EXAM: RENAL / URINARY TRACT ULTRASOUND COMPLETE COMPARISON:  CT  abdomen pelvis dated 10/24/2023. FINDINGS: Evaluation is limited due to body habitus. Right Kidney: Renal measurements: 13.2 x 6.3 x 6.4 cm = volume: 275 mL. Normal echogenicity. No hydronephrosis or shadowing stone. Left Kidney: Renal  measurements: 12.1 x 6.2 x 6.3 cm = volume: 247 mL. Normal echogenicity. No hydronephrosis or shadowing stone. Bladder: Appears normal for degree of bladder distention. Other: None. IMPRESSION: Unremarkable renal ultrasound. Electronically Signed   By: Vanetta Chou M.D.   On: 10/26/2023 15:03   DG Tibia/Fibula Left Result Date: 10/25/2023 CLINICAL DATA:  Possible necrotizing fasciitis EXAM: LEFT TIBIA AND FIBULA - 2 VIEW COMPARISON:  None Available. FINDINGS: Considerable soft tissue swelling is noted in the left lower leg. No underlying bony abnormality is seen. IMPRESSION: Soft tissue edema similar to that seen on physical exam. No underlying bony abnormality is noted. Electronically Signed   By: Oneil Devonshire M.D.   On: 10/25/2023 21:32   DG Chest Port 1 View Result Date: 10/25/2023 CLINICAL DATA:  Questionable sepsis. EXAM: PORTABLE CHEST 1 VIEW COMPARISON:  Chest radiograph dated 09/03/2023. FINDINGS: Cardiomegaly with vascular congestion. No focal consolidation, pleural effusion or pneumothorax. No acute osseous pathology. IMPRESSION: Cardiomegaly with vascular congestion. No focal consolidation. Electronically Signed   By: Vanetta Chou M.D.   On: 10/25/2023 20:36   CT L-SPINE NO CHARGE Result Date: 10/24/2023 CLINICAL DATA:  Back trauma, no prior imaging (Age >= 16y) EXAM: CT Thoracic and Lumbar spine with contrast TECHNIQUE: Multiplanar CT images of the thoracic and lumbar spine were reconstructed from contemporary CT of the Chest, Abdomen, and Pelvis. RADIATION DOSE REDUCTION: This exam was performed according to the departmental dose-optimization program which includes automated exposure control, adjustment of the mA and/or kV according to patient size and/or use of iterative reconstruction technique. CONTRAST:  No additional COMPARISON:  CT chest abdomen pelvis 11/10/2016 FINDINGS: CT THORACIC SPINE FINDINGS Alignment: Normal. Vertebrae: No acute fracture or focal pathologic process.  Paraspinal and other soft tissues: Negative. Disc levels: Maintained. CT LUMBAR SPINE FINDINGS Segmentation: 5 lumbar type vertebrae. Alignment: Normal. Vertebrae: No acute fracture or focal pathologic process. Paraspinal and other soft tissues: Negative. Disc levels: Maintained. Other: Limited sensitivity for the detection of hilar adenopathy on this noncontrast study. Please see separately dictated CT abdomen pelvis 10/24/2023. Chronic asymmetrically enlarged left greater than right psoas muscle. No surrounding fat stranding. IMPRESSION: 1. No acute displaced fracture or traumatic listhesis of the thoracolumbar spine. 2. Limited sensitivity for the detection of hilar adenopathy on this noncontrast study. Given CT abdomen pelvis findings, recommend CT chest with intravenous contrast for further evaluation. Electronically Signed   By: Morgane  Naveau M.D.   On: 10/24/2023 22:41   CT Thoracic Spine Wo Contrast Result Date: 10/24/2023 CLINICAL DATA:  Back trauma, no prior imaging (Age >= 16y) EXAM: CT Thoracic and Lumbar spine with contrast TECHNIQUE: Multiplanar CT images of the thoracic and lumbar spine were reconstructed from contemporary CT of the Chest, Abdomen, and Pelvis. RADIATION DOSE REDUCTION: This exam was performed according to the departmental dose-optimization program which includes automated exposure control, adjustment of the mA and/or kV according to patient size and/or use of iterative reconstruction technique. CONTRAST:  No additional COMPARISON:  CT chest abdomen pelvis 11/10/2016 FINDINGS: CT THORACIC SPINE FINDINGS Alignment: Normal. Vertebrae: No acute fracture or focal pathologic process. Paraspinal and other soft tissues: Negative. Disc levels: Maintained. CT LUMBAR SPINE FINDINGS Segmentation: 5 lumbar type vertebrae. Alignment: Normal. Vertebrae: No acute fracture or focal pathologic process. Paraspinal and other soft tissues:  Negative. Disc levels: Maintained. Other: Limited sensitivity  for the detection of hilar adenopathy on this noncontrast study. Please see separately dictated CT abdomen pelvis 10/24/2023. Chronic asymmetrically enlarged left greater than right psoas muscle. No surrounding fat stranding. IMPRESSION: 1. No acute displaced fracture or traumatic listhesis of the thoracolumbar spine. 2. Limited sensitivity for the detection of hilar adenopathy on this noncontrast study. Given CT abdomen pelvis findings, recommend CT chest with intravenous contrast for further evaluation. Electronically Signed   By: Morgane  Naveau M.D.   On: 10/24/2023 22:41   CT ABDOMEN PELVIS W CONTRAST Result Date: 10/24/2023 CLINICAL DATA:  Abdominal trauma, blunt.  Fall EXAM: CT ABDOMEN AND PELVIS WITH CONTRAST TECHNIQUE: Multidetector CT imaging of the abdomen and pelvis was performed using the standard protocol following bolus administration of intravenous contrast. RADIATION DOSE REDUCTION: This exam was performed according to the departmental dose-optimization program which includes automated exposure control, adjustment of the mA and/or kV according to patient size and/or use of iterative reconstruction technique. CONTRAST:  OMNIPAQUE  IOHEXOL  300 MG/ML  SOLN COMPARISON:  None Available. FINDINGS: Lower chest: No acute abnormality. Hepatobiliary: Not enlarged. No focal lesion. No laceration or subcapsular hematoma. The gallbladder is otherwise unremarkable with no radio-opaque gallstones. No biliary ductal dilatation. Pancreas: Normal pancreatic contour. No main pancreatic duct dilatation. Spleen: Not enlarged. No focal lesion. No laceration, subcapsular hematoma, or vascular injury. Adrenals/Urinary Tract: No nodularity bilaterally. Bilateral kidneys enhance symmetrically. No hydronephrosis. No contusion, laceration, or subcapsular hematoma. No injury to the vascular structures or collecting systems. No hydroureter. The urinary bladder is unremarkable. Stomach/Bowel: No small or large bowel wall  thickening or dilatation. The appendix is unremarkable. Vasculature/Lymphatics: No abdominal aorta or iliac aneurysm. No active contrast extravasation or pseudoaneurysm. Multiple enlarged bilateral inguinal, bilateral pelvic sidewall, and retroperitoneal lymph nodes. As an example a 2.1 cm left inguinal lymph node (6:108 close), a 1.5 cm left pelvic sidewall lymph node (6:81), and a 1.9 cm retroperitoneal lymph node (6:44). Reproductive: Prostate is unremarkable. Other: No simple free fluid ascites. No pneumoperitoneum. No hemoperitoneum. No mesenteric hematoma identified. No organized fluid collection. Musculoskeletal: No significant soft tissue hematoma. No acute pelvic fracture. Please see separately dictated CT thoracolumbar spine. Other ports and devices: None. IMPRESSION: 1. Multiple enlarged bilateral inguinal, bilateral pelvic sidewall, and retroperitoneal lymph nodes. Finding concerning for lymphoproliferative disorder versus less likely metastases. Recommend further evaluation with CT chest with intravenous contrast to evaluate for lymph nodes. 2. No acute intra-abdominal, intrapelvic traumatic injury. 3. Please see separately dictated CT thoracolumbar spine. Electronically Signed   By: Morgane  Naveau M.D.   On: 10/24/2023 22:34   DG Toe 2nd Right Result Date: 10/24/2023 CLINICAL DATA:  Abnormal foot x-ray EXAM: RIGHT SECOND TOE COMPARISON:  None Available. FINDINGS: There is no evidence of fracture or dislocation. There is no evidence of arthropathy or other focal bone abnormality. Soft tissues are unremarkable. IMPRESSION: Negative. Electronically Signed   By: Luke Bun M.D.   On: 10/24/2023 22:22   DG Femur Portable Min 2 Views Left Result Date: 10/24/2023 CLINICAL DATA:  ttp.  Fall EXAM: LEFT FEMUR PORTABLE 2 VIEWS COMPARISON:  None Available. FINDINGS: There is no evidence of fracture or other focal bone lesions. Soft tissues are unremarkable. IMPRESSION: Negative for acute traumatic  injury. Electronically Signed   By: Morgane  Naveau M.D.   On: 10/24/2023 21:32   DG Femur Portable Min 2 Views Right Result Date: 10/24/2023 CLINICAL DATA:  ttp EXAM: RIGHT FEMUR PORTABLE 2 VIEW COMPARISON:  None Available. FINDINGS: There is no evidence of fracture or other focal bone lesions. Soft tissues are unremarkable. IMPRESSION: Negative for acute traumatic injury. Electronically Signed   By: Morgane  Naveau M.D.   On: 10/24/2023 21:31   DG Tibia/Fibula Right Result Date: 10/24/2023 CLINICAL DATA:  Fall EXAM: RIGHT TIBIA AND FIBULA - 2 VIEW COMPARISON:  X-ray right tibia fibula 08/09/2022 FINDINGS: There is no evidence of fracture or other focal bone lesions. Persistent diffuse subcutaneus soft tissue edema. IMPRESSION: No acute displaced fracture or dislocation. Electronically Signed   By: Morgane  Naveau M.D.   On: 10/24/2023 21:31   DG Pelvis Portable Result Date: 10/24/2023 CLINICAL DATA:  Fall EXAM: PORTABLE PELVIS 1-2 VIEWS COMPARISON:  None Available. FINDINGS: Limited evaluation due to overlapping osseous structures and overlying soft tissues. There is no evidence of pelvic fracture or diastasis. No acute displaced fracture or dislocation of either hips. No pelvic bone lesions are seen. IMPRESSION: Negative for acute traumatic injury. Electronically Signed   By: Morgane  Naveau M.D.   On: 10/24/2023 21:26   DG Foot Complete Right Result Date: 10/24/2023 CLINICAL DATA:  ttp of 2nd and 3rd digits EXAM: RIGHT FOOT COMPLETE - 3+ VIEW COMPARISON:  None Available. FINDINGS: Question dislocation of the second digit distal interphalangeal joint- limited evaluation due to overlapping osseous structures and overlying soft tissues. No acute displaced fracture there is no evidence of arthropathy or other focal bone abnormality. Subcutaneus soft tissue edema. IMPRESSION: Question dislocation of the second digit distal interphalangeal joint- limited evaluation due to overlapping osseous structures and  overlying soft tissues. Consider dedicated radiographs of the second digit. Electronically Signed   By: Morgane  Naveau M.D.   On: 10/24/2023 21:19   DG Tibia/Fibula Left Result Date: 10/24/2023 CLINICAL DATA:  Pain.  Fall. EXAM: LEFT TIBIA AND FIBULA - 2 VIEW COMPARISON:  None Available. FINDINGS: There is no evidence of fracture or other focal bone lesions. Soft tissues are unremarkable. IMPRESSION: Negative. Electronically Signed   By: Greig Pique M.D.   On: 10/24/2023 21:17    Microbiology: Results for orders placed or performed during the hospital encounter of 10/25/23  Resp panel by RT-PCR (RSV, Flu A&B, Covid) Anterior Nasal Swab     Status: None   Collection Time: 10/25/23  7:12 PM   Specimen: Anterior Nasal Swab  Result Value Ref Range Status   SARS Coronavirus 2 by RT PCR NEGATIVE NEGATIVE Final    Comment: (NOTE) SARS-CoV-2 target nucleic acids are NOT DETECTED.  The SARS-CoV-2 RNA is generally detectable in upper respiratory specimens during the acute phase of infection. The lowest concentration of SARS-CoV-2 viral copies this assay can detect is 138 copies/mL. A negative result does not preclude SARS-Cov-2 infection and should not be used as the sole basis for treatment or other patient management decisions. A negative result may occur with  improper specimen collection/handling, submission of specimen other than nasopharyngeal swab, presence of viral mutation(s) within the areas targeted by this assay, and inadequate number of viral copies(<138 copies/mL). A negative result must be combined with clinical observations, patient history, and epidemiological information. The expected result is Negative.  Fact Sheet for Patients:  BloggerCourse.com  Fact Sheet for Healthcare Providers:  SeriousBroker.it  This test is no t yet approved or cleared by the [place]  FDA and  has been authorized for detection and/or  diagnosis of SARS-CoV-2 by FDA under an Emergency Use Authorization (EUA). This EUA will remain  in effect (meaning this test can be used)  for the duration of the COVID-19 declaration under Section 564(b)(1) of the Act, 21 U.S.C.section 360bbb-3(b)(1), unless the authorization is terminated  or revoked sooner.       Influenza A by PCR NEGATIVE NEGATIVE Final   Influenza B by PCR NEGATIVE NEGATIVE Final    Comment: (NOTE) The Xpert Xpress SARS-CoV-2/FLU/RSV plus assay is intended as an aid in the diagnosis of influenza from Nasopharyngeal swab specimens and should not be used as a sole basis for treatment. Nasal washings and aspirates are unacceptable for Xpert Xpress SARS-CoV-2/FLU/RSV testing.  Fact Sheet for Patients: BloggerCourse.com  Fact Sheet for Healthcare Providers: SeriousBroker.it  This test is not yet approved or cleared by the [place]  FDA and has been authorized for detection and/or diagnosis of SARS-CoV-2 by FDA under an Emergency Use Authorization (EUA). This EUA will remain in effect (meaning this test can be used) for the duration of the COVID-19 declaration under Section 564(b)(1) of the Act, 21 U.S.C. section 360bbb-3(b)(1), unless the authorization is terminated or revoked.     Resp Syncytial Virus by PCR NEGATIVE NEGATIVE Final    Comment: (NOTE) Fact Sheet for Patients: BloggerCourse.com  Fact Sheet for Healthcare Providers: SeriousBroker.it  This test is not yet approved or cleared by the [place]  FDA and has been authorized for detection and/or diagnosis of SARS-CoV-2 by FDA under an Emergency Use Authorization (EUA). This EUA will remain in effect (meaning this test can be used) for the duration of the COVID-19 declaration under Section 564(b)(1) of the Act, 21 U.S.C. section 360bbb-3(b)(1), unless the authorization is terminated  or revoked.  Performed at [facility], [address] [place], [place] [address]   Blood Culture (routine x 2)     Status: Abnormal   Collection Time: 10/25/23  8:20 PM   Specimen: BLOOD RIGHT ARM  Result Value Ref Range Status   Specimen Description   Final    BLOOD RIGHT ARM Performed at [facility], [address] [place], [place] [address]    Special Requests   Final    BOTTLES DRAWN AEROBIC AND ANAEROBIC Blood Culture adequate volume Performed at [facility], [address] [place], [place] [address]    Culture  Setup Time   Final    GRAM POSITIVE COCCI IN BOTH AEROBIC AND ANAEROBIC BOTTLES CRITICAL RESULT CALLED TO, READ BACK BY AND VERIFIED WITH: WALID NAZARI ON 10/26/23 AT 9177 QSD Performed at [facility], [address] [place], [place] [address]    Culture (A)  Final    STREPTOCOCCUS GROUP G SUSCEPTIBILITIES PERFORMED ON PREVIOUS CULTURE WITHIN THE LAST 5 DAYS. Performed at [facility], [address], [place], [place] [address]    Report Status 10/28/2023 FINAL  Final  Blood Culture (routine x 2)     Status: Abnormal   Collection Time: 10/25/23  8:27 PM   Specimen: BLOOD LEFT ARM  Result Value Ref Range Status   Specimen Description   Final    BLOOD LEFT ARM Performed at [facility], [address], [place], [place] [address]    Special Requests   Final    BOTTLES DRAWN AEROBIC AND ANAEROBIC Blood Culture adequate volume Performed at [facility], [address] [place], [place] [address]    Culture  Setup Time   Final    GRAM POSITIVE COCCI IN BOTH AEROBIC AND ANAEROBIC BOTTLES Organism ID to follow CRITICAL RESULT CALLED TO, READ BACK BY AND VERIFIED WITH: WALID NAZARI ON  10/26/23 AT 0822 QSD Performed at [facility], [address] [place], [place] [address]    Culture STREPTOCOCCUS GROUP G (A)  Final   Report Status 10/28/2023 FINAL  Final   Organism ID, Bacteria  STREPTOCOCCUS GROUP G  Final      Susceptibility   Streptococcus group g - MIC*    CLINDAMYCIN  RESISTANT Resistant     AMPICILLIN <=0.25 SENSITIVE Sensitive     ERYTHROMYCIN >=8 RESISTANT Resistant     VANCOMYCIN  0.5 SENSITIVE Sensitive     CEFTRIAXONE  <=0.12 SENSITIVE Sensitive     LEVOFLOXACIN 0.5 SENSITIVE Sensitive     PENICILLIN  <=0.06 SENSITIVE Sensitive     * STREPTOCOCCUS GROUP G  Blood Culture ID Panel (Reflexed)     Status: Abnormal   Collection Time: 10/25/23  8:27 PM  Result Value Ref Range Status   Enterococcus faecalis NOT DETECTED NOT DETECTED Final   Enterococcus Faecium NOT DETECTED NOT DETECTED Final   Listeria monocytogenes NOT DETECTED NOT DETECTED Final   Staphylococcus species NOT DETECTED NOT DETECTED Final   Staphylococcus aureus (BCID) NOT DETECTED NOT DETECTED Final   Staphylococcus epidermidis NOT DETECTED NOT DETECTED Final   Staphylococcus lugdunensis NOT DETECTED NOT DETECTED Final   Streptococcus species DETECTED (A) NOT DETECTED Final    Comment: Not Enterococcus species, Streptococcus agalactiae, Streptococcus pyogenes, or Streptococcus pneumoniae. CRITICAL RESULT CALLED TO, READ BACK BY AND VERIFIED WITH: WALID NAZARI ON 10/26/23 AT 0822 QSD    Streptococcus agalactiae NOT DETECTED NOT DETECTED Final   Streptococcus pneumoniae NOT DETECTED NOT DETECTED Final   Streptococcus pyogenes NOT DETECTED NOT DETECTED Final   A.calcoaceticus-baumannii NOT DETECTED NOT DETECTED Final   Bacteroides fragilis NOT DETECTED NOT DETECTED Final   Enterobacterales NOT DETECTED NOT DETECTED Final   Enterobacter cloacae complex NOT DETECTED NOT DETECTED Final   Escherichia coli NOT DETECTED NOT DETECTED Final   Klebsiella aerogenes NOT DETECTED NOT DETECTED Final   Klebsiella oxytoca NOT DETECTED NOT DETECTED Final   Klebsiella pneumoniae NOT DETECTED NOT DETECTED Final   Proteus species NOT DETECTED NOT DETECTED Final   Salmonella species NOT DETECTED NOT DETECTED  Final   Serratia marcescens NOT DETECTED NOT DETECTED Final   Haemophilus influenzae NOT DETECTED NOT DETECTED Final   Neisseria meningitidis NOT DETECTED NOT DETECTED Final   Pseudomonas aeruginosa NOT DETECTED NOT DETECTED Final   Stenotrophomonas maltophilia NOT DETECTED NOT DETECTED Final   Candida albicans NOT DETECTED NOT DETECTED Final   Candida auris NOT DETECTED NOT DETECTED Final   Candida glabrata NOT DETECTED NOT DETECTED Final   Candida krusei NOT DETECTED NOT DETECTED Final   Candida parapsilosis NOT DETECTED NOT DETECTED Final   Candida tropicalis NOT DETECTED NOT DETECTED Final   Cryptococcus neoformans/gattii NOT DETECTED NOT DETECTED Final    Comment: Performed at [facility], [address] [place], [place] [address]  MRSA Next Gen by PCR, Nasal     Status: Abnormal   Collection Time: 10/26/23  1:01 PM   Specimen: Nasal Mucosa; Nasal Swab  Result Value Ref Range Status   MRSA by PCR Next Gen DETECTED (A) NOT DETECTED Final    Comment: RESULT CALLED TO, READ BACK BY AND VERIFIED WITH: MYRDA N., RN AT 1628 10/26/23 RAM (NOTE) The GeneXpert MRSA Assay (FDA approved for NASAL specimens only), is one component of a comprehensive MRSA colonization surveillance program. It is not intended to diagnose MRSA infection nor to guide or monitor treatment for MRSA infections. Test performance is not  FDA approved in patients less than 49 years old. Performed at [facility], [address] [place], [place] [address]   Culture, blood (Routine X 2) w Reflex to ID Panel     Status: None   Collection Time: 10/28/23 11:05 AM   Specimen: BLOOD  Result Value Ref Range Status   Specimen Description BLOOD BLOOD LEFT HAND  Final   Special Requests   Final    BOTTLES DRAWN AEROBIC AND ANAEROBIC Blood Culture adequate volume   Culture   Final    NO GROWTH 5 DAYS Performed at [facility], [address] [place], [place] [address]    Report Status  11/02/2023 FINAL  Final  Culture, blood (Routine X 2) w Reflex to ID Panel     Status: None   Collection Time: 10/28/23 11:11 AM   Specimen: BLOOD  Result Value Ref Range Status   Specimen Description BLOOD BLOOD LEFT HAND  Final   Special Requests   Final    BOTTLES DRAWN AEROBIC AND ANAEROBIC Blood Culture adequate volume   Culture   Final    NO GROWTH 5 DAYS Performed at [facility], [address], [place], [place] [address]    Report Status 11/02/2023 FINAL  Final  Aerobic Culture w Gram Stain (superficial specimen)     Status: None   Collection Time: 10/28/23  4:24 PM   Specimen: Wound  Result Value Ref Range Status   Specimen Description   Final    WOUND Performed at [facility], [address] [place], [place] [address]    Special Requests   Final    LL Performed at [facility], [address] [place], [place] [address]    Gram Stain NO WBC SEEN MODERATE GRAM POSITIVE COCCI   Final   Culture   Final    MODERATE STAPHYLOCOCCUS SIMULANS MODERATE METHICILLIN RESISTANT STAPHYLOCOCCUS AUREUS MODERATE DIPHTHEROIDS(CORYNEBACTERIUM SPECIES) Standardized susceptibility testing for this organism is not available. Performed at [facility], [address] [place], [place] [address]    Report Status 10/31/2023 FINAL  Final   Organism ID, Bacteria STAPHYLOCOCCUS SIMULANS  Final   Organism ID, Bacteria METHICILLIN RESISTANT STAPHYLOCOCCUS AUREUS  Final      Susceptibility   Methicillin resistant staphylococcus aureus - MIC*    CIPROFLOXACIN <=0.5 SENSITIVE Sensitive     ERYTHROMYCIN >=8 RESISTANT Resistant     GENTAMICIN <=0.5 SENSITIVE Sensitive     OXACILLIN >=4 RESISTANT Resistant     TETRACYCLINE <=1 SENSITIVE Sensitive     VANCOMYCIN  <=0.5 SENSITIVE Sensitive     TRIMETH/SULFA <=10 SENSITIVE Sensitive     CLINDAMYCIN  <=0.25 SENSITIVE Sensitive     RIFAMPIN <=0.5 SENSITIVE Sensitive     Inducible Clindamycin  NEGATIVE Sensitive      LINEZOLID  2 SENSITIVE Sensitive     * MODERATE METHICILLIN RESISTANT STAPHYLOCOCCUS AUREUS   Staphylococcus simulans - MIC*    CIPROFLOXACIN <=0.5 SENSITIVE Sensitive     ERYTHROMYCIN <=0.25 SENSITIVE Sensitive     GENTAMICIN <=0.5 SENSITIVE Sensitive     OXACILLIN >=4 RESISTANT Resistant     TETRACYCLINE <=1 SENSITIVE Sensitive     VANCOMYCIN  <=0.5 SENSITIVE Sensitive     TRIMETH/SULFA <=10 SENSITIVE Sensitive     CLINDAMYCIN  <=0.25 SENSITIVE Sensitive     RIFAMPIN <=0.5 SENSITIVE Sensitive     Inducible Clindamycin  NEGATIVE Sensitive     * MODERATE STAPHYLOCOCCUS SIMULANS  Aerobic Culture w Gram Stain (superficial specimen)     Status:  None   Collection Time: 10/28/23  4:25 PM   Specimen: Wound  Result Value Ref Range Status   Specimen Description   Final    WOUND Performed at [facility], [address] [place], [place] [address]    Special Requests   Final    RL Performed at [facility], [address] [place], [place] [address]    Gram Stain   Final    RARE WBC PRESENT, PREDOMINANTLY PMN RARE GRAM POSITIVE COCCI    Culture   Final    FEW STAPHYLOCOCCUS SIMULANS FEW STAPHYLOCOCCUS AUREUS SUSCEPTIBILITIES PERFORMED ON PREVIOUS CULTURE WITHIN THE LAST 5 DAYS. Performed at [facility], [address] [place], [place] [address]    Report Status 10/31/2023 FINAL  Final    Labs: CBC: No results for input(s): WBC, NEUTROABS, HGB, HCT, MCV, PLT in the last 168 hours. Basic Metabolic Panel: No results for input(s): NA, K, CL, CO2, GLUCOSE, BUN, CREATININE, CALCIUM, MG, PHOS in the last 168 hours. Liver Function Tests: No results for input(s): AST, ALT, ALKPHOS, BILITOT, PROT, ALBUMIN in the last 168 hours. CBG: No results for input(s): GLUCAP in the last 168 hours.  Discharge time spent: less than 30 minutes.  Signed: Burnard DELENA Cunning, DO [facility] 11/13/2023

## 2023-11-17 ENCOUNTER — Other Ambulatory Visit

## 2023-11-18 ENCOUNTER — Encounter (HOSPITAL_BASED_OUTPATIENT_CLINIC_OR_DEPARTMENT_OTHER): Attending: General Surgery | Admitting: General Surgery

## 2023-11-18 DIAGNOSIS — L97822 Non-pressure chronic ulcer of other part of left lower leg with fat layer exposed: Secondary | ICD-10-CM | POA: Insufficient documentation

## 2023-11-18 DIAGNOSIS — Z6841 Body Mass Index (BMI) 40.0 and over, adult: Secondary | ICD-10-CM | POA: Diagnosis not present

## 2023-11-18 DIAGNOSIS — I890 Lymphedema, not elsewhere classified: Secondary | ICD-10-CM | POA: Diagnosis not present

## 2023-11-18 DIAGNOSIS — E662 Morbid (severe) obesity with alveolar hypoventilation: Secondary | ICD-10-CM | POA: Diagnosis not present

## 2023-11-18 DIAGNOSIS — L97812 Non-pressure chronic ulcer of other part of right lower leg with fat layer exposed: Secondary | ICD-10-CM | POA: Insufficient documentation

## 2023-11-20 DIAGNOSIS — I119 Hypertensive heart disease without heart failure: Secondary | ICD-10-CM | POA: Diagnosis not present

## 2023-11-20 DIAGNOSIS — A419 Sepsis, unspecified organism: Secondary | ICD-10-CM | POA: Diagnosis not present

## 2023-11-20 DIAGNOSIS — F17210 Nicotine dependence, cigarettes, uncomplicated: Secondary | ICD-10-CM | POA: Diagnosis not present

## 2023-11-20 DIAGNOSIS — I480 Paroxysmal atrial fibrillation: Secondary | ICD-10-CM | POA: Diagnosis not present

## 2023-11-20 DIAGNOSIS — L03116 Cellulitis of left lower limb: Secondary | ICD-10-CM | POA: Diagnosis not present

## 2023-11-20 DIAGNOSIS — Z6841 Body Mass Index (BMI) 40.0 and over, adult: Secondary | ICD-10-CM | POA: Diagnosis not present

## 2023-11-20 DIAGNOSIS — E785 Hyperlipidemia, unspecified: Secondary | ICD-10-CM | POA: Diagnosis not present

## 2023-11-20 DIAGNOSIS — I160 Hypertensive urgency: Secondary | ICD-10-CM | POA: Diagnosis not present

## 2023-11-20 DIAGNOSIS — Z556 Problems related to health literacy: Secondary | ICD-10-CM | POA: Diagnosis not present

## 2023-11-20 DIAGNOSIS — I872 Venous insufficiency (chronic) (peripheral): Secondary | ICD-10-CM | POA: Diagnosis not present

## 2023-11-20 DIAGNOSIS — I890 Lymphedema, not elsewhere classified: Secondary | ICD-10-CM | POA: Diagnosis not present

## 2023-11-20 DIAGNOSIS — E662 Morbid (severe) obesity with alveolar hypoventilation: Secondary | ICD-10-CM | POA: Diagnosis not present

## 2023-11-20 DIAGNOSIS — I739 Peripheral vascular disease, unspecified: Secondary | ICD-10-CM | POA: Diagnosis not present

## 2023-11-21 DIAGNOSIS — A419 Sepsis, unspecified organism: Secondary | ICD-10-CM | POA: Diagnosis not present

## 2023-11-21 DIAGNOSIS — I119 Hypertensive heart disease without heart failure: Secondary | ICD-10-CM | POA: Diagnosis not present

## 2023-11-21 DIAGNOSIS — I872 Venous insufficiency (chronic) (peripheral): Secondary | ICD-10-CM | POA: Diagnosis not present

## 2023-11-21 DIAGNOSIS — F17210 Nicotine dependence, cigarettes, uncomplicated: Secondary | ICD-10-CM | POA: Diagnosis not present

## 2023-11-21 DIAGNOSIS — E662 Morbid (severe) obesity with alveolar hypoventilation: Secondary | ICD-10-CM | POA: Diagnosis not present

## 2023-11-21 DIAGNOSIS — I480 Paroxysmal atrial fibrillation: Secondary | ICD-10-CM | POA: Diagnosis not present

## 2023-11-21 DIAGNOSIS — E785 Hyperlipidemia, unspecified: Secondary | ICD-10-CM | POA: Diagnosis not present

## 2023-11-21 DIAGNOSIS — I890 Lymphedema, not elsewhere classified: Secondary | ICD-10-CM | POA: Diagnosis not present

## 2023-11-21 DIAGNOSIS — Z556 Problems related to health literacy: Secondary | ICD-10-CM | POA: Diagnosis not present

## 2023-11-21 DIAGNOSIS — Z6841 Body Mass Index (BMI) 40.0 and over, adult: Secondary | ICD-10-CM | POA: Diagnosis not present

## 2023-11-21 DIAGNOSIS — I739 Peripheral vascular disease, unspecified: Secondary | ICD-10-CM | POA: Diagnosis not present

## 2023-11-21 DIAGNOSIS — L03116 Cellulitis of left lower limb: Secondary | ICD-10-CM | POA: Diagnosis not present

## 2023-11-21 DIAGNOSIS — I160 Hypertensive urgency: Secondary | ICD-10-CM | POA: Diagnosis not present

## 2023-11-25 ENCOUNTER — Telehealth (INDEPENDENT_AMBULATORY_CARE_PROVIDER_SITE_OTHER): Payer: Self-pay

## 2023-11-25 ENCOUNTER — Other Ambulatory Visit: Payer: Self-pay | Admitting: *Deleted

## 2023-11-25 DIAGNOSIS — I160 Hypertensive urgency: Secondary | ICD-10-CM | POA: Diagnosis not present

## 2023-11-25 DIAGNOSIS — I890 Lymphedema, not elsewhere classified: Secondary | ICD-10-CM | POA: Diagnosis not present

## 2023-11-25 DIAGNOSIS — I119 Hypertensive heart disease without heart failure: Secondary | ICD-10-CM | POA: Diagnosis not present

## 2023-11-25 DIAGNOSIS — L03116 Cellulitis of left lower limb: Secondary | ICD-10-CM | POA: Diagnosis not present

## 2023-11-25 DIAGNOSIS — I739 Peripheral vascular disease, unspecified: Secondary | ICD-10-CM | POA: Diagnosis not present

## 2023-11-25 DIAGNOSIS — F17210 Nicotine dependence, cigarettes, uncomplicated: Secondary | ICD-10-CM | POA: Diagnosis not present

## 2023-11-25 DIAGNOSIS — E662 Morbid (severe) obesity with alveolar hypoventilation: Secondary | ICD-10-CM | POA: Diagnosis not present

## 2023-11-25 DIAGNOSIS — Z556 Problems related to health literacy: Secondary | ICD-10-CM | POA: Diagnosis not present

## 2023-11-25 DIAGNOSIS — A419 Sepsis, unspecified organism: Secondary | ICD-10-CM | POA: Diagnosis not present

## 2023-11-25 DIAGNOSIS — I480 Paroxysmal atrial fibrillation: Secondary | ICD-10-CM | POA: Diagnosis not present

## 2023-11-25 DIAGNOSIS — Z6841 Body Mass Index (BMI) 40.0 and over, adult: Secondary | ICD-10-CM | POA: Diagnosis not present

## 2023-11-25 DIAGNOSIS — E785 Hyperlipidemia, unspecified: Secondary | ICD-10-CM | POA: Diagnosis not present

## 2023-11-25 DIAGNOSIS — I872 Venous insufficiency (chronic) (peripheral): Secondary | ICD-10-CM | POA: Diagnosis not present

## 2023-11-25 NOTE — Telephone Encounter (Signed)
 FYI

## 2023-11-25 NOTE — Patient Instructions (Signed)
 Fairy JONETTA Solian - I am sorry I was unable to reach you today for our scheduled appointment. I work with Celestia Rosaline SQUIBB, NP and am calling to support your healthcare needs.  I have reschedule your appointment for 12/15/23 @ 3 pm. Please contact me at [phone] at your earliest convenience. I look forward to speaking with you soon.   Thank you,  Ghali Morissette, RN, BSN, ACM RN Care Manager [employer] [phone]

## 2023-11-25 NOTE — Telephone Encounter (Signed)
 Copied from CRM [id number]. Topic: Clinical - Medical Advice >> Nov 25, 2023 11:27 AM Harlene ORN wrote: Reason for CRM: [place] - nurse with [facility] Patient's BP report: 178 over 98 did not take his medication yet, but will be taking his medication in the next 10 minutes. Phone: [phone]

## 2023-11-26 DIAGNOSIS — G4733 Obstructive sleep apnea (adult) (pediatric): Secondary | ICD-10-CM | POA: Diagnosis not present

## 2023-11-27 ENCOUNTER — Other Ambulatory Visit: Payer: Self-pay

## 2023-11-27 ENCOUNTER — Ambulatory Visit (HOSPITAL_BASED_OUTPATIENT_CLINIC_OR_DEPARTMENT_OTHER): Admitting: Internal Medicine

## 2023-11-27 ENCOUNTER — Encounter (HOSPITAL_BASED_OUTPATIENT_CLINIC_OR_DEPARTMENT_OTHER): Admitting: Family

## 2023-11-27 DIAGNOSIS — I10 Essential (primary) hypertension: Secondary | ICD-10-CM

## 2023-11-28 ENCOUNTER — Other Ambulatory Visit: Payer: Self-pay | Admitting: Family

## 2023-11-28 DIAGNOSIS — I10 Essential (primary) hypertension: Secondary | ICD-10-CM

## 2023-11-28 DIAGNOSIS — Z556 Problems related to health literacy: Secondary | ICD-10-CM | POA: Diagnosis not present

## 2023-11-28 DIAGNOSIS — I739 Peripheral vascular disease, unspecified: Secondary | ICD-10-CM | POA: Diagnosis not present

## 2023-11-28 DIAGNOSIS — E785 Hyperlipidemia, unspecified: Secondary | ICD-10-CM | POA: Diagnosis not present

## 2023-11-28 DIAGNOSIS — Z6841 Body Mass Index (BMI) 40.0 and over, adult: Secondary | ICD-10-CM | POA: Diagnosis not present

## 2023-11-28 DIAGNOSIS — I872 Venous insufficiency (chronic) (peripheral): Secondary | ICD-10-CM | POA: Diagnosis not present

## 2023-11-28 DIAGNOSIS — R600 Localized edema: Secondary | ICD-10-CM

## 2023-11-28 DIAGNOSIS — I480 Paroxysmal atrial fibrillation: Secondary | ICD-10-CM | POA: Diagnosis not present

## 2023-11-28 DIAGNOSIS — L03116 Cellulitis of left lower limb: Secondary | ICD-10-CM | POA: Diagnosis not present

## 2023-11-28 DIAGNOSIS — I890 Lymphedema, not elsewhere classified: Secondary | ICD-10-CM | POA: Diagnosis not present

## 2023-11-28 DIAGNOSIS — I160 Hypertensive urgency: Secondary | ICD-10-CM | POA: Diagnosis not present

## 2023-11-28 DIAGNOSIS — F17210 Nicotine dependence, cigarettes, uncomplicated: Secondary | ICD-10-CM | POA: Diagnosis not present

## 2023-11-28 DIAGNOSIS — A419 Sepsis, unspecified organism: Secondary | ICD-10-CM | POA: Diagnosis not present

## 2023-11-28 DIAGNOSIS — E662 Morbid (severe) obesity with alveolar hypoventilation: Secondary | ICD-10-CM | POA: Diagnosis not present

## 2023-11-28 DIAGNOSIS — I119 Hypertensive heart disease without heart failure: Secondary | ICD-10-CM | POA: Diagnosis not present

## 2023-11-28 MED ORDER — CARVEDILOL 25 MG PO TABS: 25.0000 mg | ORAL_TABLET | Freq: Two times a day (BID) | ORAL | 6 refills | Status: DC

## 2023-11-28 MED ORDER — AMLODIPINE BESYLATE 10 MG PO TABS: 10.0000 mg | ORAL_TABLET | Freq: Every day | ORAL | 6 refills | Status: DC

## 2023-11-28 MED ORDER — SPIRONOLACTONE 25 MG PO TABS: 25.0000 mg | ORAL_TABLET | Freq: Two times a day (BID) | ORAL | 10 refills | Status: DC

## 2023-11-28 MED ORDER — HYDRALAZINE HCL 100 MG PO TABS: 100.0000 mg | ORAL_TABLET | Freq: Three times a day (TID) | ORAL | 3 refills | Status: DC

## 2023-12-01 ENCOUNTER — Telehealth: Payer: Self-pay

## 2023-12-01 ENCOUNTER — Encounter

## 2023-12-01 ENCOUNTER — Other Ambulatory Visit: Payer: Self-pay | Admitting: Family

## 2023-12-01 DIAGNOSIS — I10 Essential (primary) hypertension: Secondary | ICD-10-CM

## 2023-12-01 DIAGNOSIS — R600 Localized edema: Secondary | ICD-10-CM

## 2023-12-01 MED ORDER — SPIRONOLACTONE 25 MG PO TABS: 25.0000 mg | ORAL_TABLET | Freq: Two times a day (BID) | ORAL | 2 refills | Status: DC

## 2023-12-01 NOTE — Telephone Encounter (Signed)
 Is interested in joining next class at [facility] which will start Nov 3. Scheduled initial assessment on 10/27 at 10:30

## 2023-12-02 ENCOUNTER — Other Ambulatory Visit: Payer: Self-pay

## 2023-12-02 ENCOUNTER — Ambulatory Visit (HOSPITAL_BASED_OUTPATIENT_CLINIC_OR_DEPARTMENT_OTHER): Admitting: General Surgery

## 2023-12-02 NOTE — Patient Instructions (Signed)

## 2023-12-02 NOTE — Patient Outreach (Signed)
 BSW outreached patient today to confirm if he had received the community resources that were previously mailed. During the call, patient stated that he had not received the resources in the mail and requested that they be sent to his email instead. BSW emailed the community resources to the patient and will also resend the letter to the patient's mailing address. BSW informed patient to contact her if he had any further questions or needed additional assistance. BSW will close the case at this time.  Orlean Fey, BSW [place]  [facility] Social Worker, [employer] [facility] [phone]

## 2023-12-04 MED ORDER — IRBESARTAN 150 MG PO TABS: 150.0000 mg | ORAL_TABLET | Freq: Every day | ORAL | 2 refills | Status: DC

## 2023-12-10 ENCOUNTER — Other Ambulatory Visit: Payer: Self-pay | Admitting: Pharmacist

## 2023-12-10 NOTE — Progress Notes (Signed)
 Pharmacy Quality Measure Review  This patient is appearing on a report for being at risk of failing the adherence measure for hypertension (ACEi/ARB) medications this calendar year.   Medication: irbesartan  Last fill date: 12/05/2023 for 90 day supply  Insurance report was not up to date. No action needed at this time.   Herlene Fleeta Morris, PharmD, JAQUELINE, CPP Clinical Pharmacist [facility] [phone]

## 2023-12-15 ENCOUNTER — Other Ambulatory Visit: Payer: Self-pay | Admitting: *Deleted

## 2023-12-15 ENCOUNTER — Encounter: Payer: Self-pay | Admitting: Radiology

## 2023-12-17 ENCOUNTER — Other Ambulatory Visit: Payer: Self-pay

## 2023-12-17 NOTE — Patient Instructions (Signed)
 Visit Information  Thank you for taking time to visit with me today. Please don't hesitate to contact me if I can be of assistance to you before our next scheduled appointment.  Your next care management appointment is by telephone on 01/20/24 at  11 am  Please call the care guide team at [phone] if you need to cancel, schedule, or reschedule an appointment.   Please call the Suicide and Crisis Lifeline: 988 call the [place]  National Suicide Prevention Lifeline: 276 776 9092 or TTY: 442-588-2259 TTY 717-435-2979) to talk to a trained counselor call 1-800-273-TALK (toll free, 24 hour hotline) go to [facility] [address], [place] [phone]) call the [facility] Crisis Line: [phone] call 911 if you are experiencing a Mental Health or Behavioral Health Crisis or need someone to talk to.  Roquel Burgin, RN, BSN, Theatre Manager [employer] [phone]

## 2023-12-17 NOTE — Patient Outreach (Signed)
 Complex Care Management   Visit Note  12/17/2023  Name:  Jared Mccoy MRN: [id number] DOB: 05/15/1990  Situation: Referral received for Complex Care Management related to SDOH Barriers:  Housing Insecurity and HTN I obtained verbal consent from Parent.  Visit completed with Patient  on the phone  Background:   Past Medical History:  Diagnosis Date   Hypertension    not on medication   Obese    Peripheral vascular disease    edema in legs     Assessment: Patient Reported Symptoms:  Cognitive Cognitive Status: Able to follow simple commands, Alert and oriented to person, place, and time, Insightful and able to interpret abstract concepts, Normal speech and language skills Cognitive/Intellectual Conditions Management [RPT]: None reported or documented in medical history or problem list   Health Maintenance Behaviors: Annual physical exam, Healthy diet, Sleep adequate, Stress management Healing Pattern: Average Health Facilitated by: Rest, Stress management, Healthy diet  Neurological Neurological Review of Symptoms: No symptoms reported Neurological Management Strategies: Adequate rest, Routine screening Neurological Self-Management Outcome: 4 (good)  HEENT HEENT Symptoms Reported: No symptoms reported HEENT Management Strategies: Adequate rest, Routine screening HEENT Self-Management Outcome: 4 (good)    Cardiovascular Cardiovascular Symptoms Reported: Swelling in legs or feet Cardiovascular Management Strategies: Adequate rest, Routine screening Cardiovascular Self-Management Outcome: 3 (uncertain) Cardiovascular Comment: Patient reports no blood pressure readings.  I have encouraged patient to take blood pressure 1-3 weekly and log readings.  Respiratory Respiratory Symptoms Reported: No symptoms reported Respiratory Management Strategies: Adequate rest, Routine screening  Endocrine Endocrine Symptoms Reported: No symptoms reported Is patient diabetic?: No Endocrine  Self-Management Outcome: 4 (good)  Gastrointestinal Gastrointestinal Symptoms Reported: No symptoms reported Gastrointestinal Management Strategies: Adequate rest Gastrointestinal Self-Management Outcome: 4 (good)    Genitourinary Genitourinary Symptoms Reported: No symptoms reported Genitourinary Management Strategies: Adequate rest Genitourinary Self-Management Outcome: 4 (good)  Integumentary Integumentary Symptoms Reported: Wound Additional Integumentary Details: Patient reports that he has wounds to his lower extremity legs bilaterally.  He reports that home health comes out 2 times weekly to dress the wounds.  He reports that he is also active with Outpatient wound care clinic. Skin Management Strategies: Adequate rest, Routine screening, Dressing changes Skin Self-Management Outcome: 3 (uncertain)  Musculoskeletal Musculoskelatal Symptoms Reviewed: Difficulty walking, Limited mobility Additional Musculoskeletal Details: Patient reports that PT/OT and RN is coming out to work to his home to work with him on strengthening.  He reports that RN comes to the home to complete wound care 2 times weekly. Musculoskeletal Management Strategies: Adequate rest, Medication therapy, Coping strategies, Routine screening Musculoskeletal Self-Management Outcome: 3 (uncertain) Falls in the past year?: No Number of falls in past year: 1 or less Was there an injury with Fall?: No Fall Risk Category Calculator: 0 Patient Fall Risk Level: Low Fall Risk Patient at Risk for Falls Due to: No Fall Risks Fall risk Follow up: Falls evaluation completed  Psychosocial Psychosocial Symptoms Reported: No symptoms reported   Major Change/Loss/Stressor/Fears (CP): Denies Quality of Family Relationships: helpful, involved, supportive Do you feel physically threatened by others?: No    12/17/2023    PHQ2-9 Depression Screening   Little interest or pleasure in doing things Not at all  Feeling down, depressed, or  hopeless Not at all  PHQ-2 - Total Score 0  Trouble falling or staying asleep, or sleeping too much    Feeling tired or having little energy    Poor appetite or overeating     Feeling bad about yourself - or  that you are a failure or have let yourself or your family down    Trouble concentrating on things, such as reading the newspaper or watching television    Moving or speaking so slowly that other people could have noticed.  Or the opposite - being so fidgety or restless that you have been moving around a lot more than usual    Thoughts that you would be better off dead, or hurting yourself in some way    PHQ2-9 Total Score    If you checked off any problems, how difficult have these problems made it for you to do your work, take care of things at home, or get along with other people    Depression Interventions/Treatment      There were no vitals filed for this visit.  Medications Reviewed Today     Reviewed by Jorja Nichole LABOR, RN (Case Manager) on 12/17/23 at 1331  Med List Status: <None>   Medication Order Taking? Sig Documenting Provider Last Dose Status Informant  acetaminophen  (TYLENOL ) 325 MG tablet [id number] Yes Take 2 tablets (650 mg total) by mouth every 6 (six) hours as needed for mild pain (pain score 1-3) or fever (or Fever >/= 101). Sherrill Cable Dean, DO  Active Pharmacy Records, Other  amLODipine  (NORVASC ) 10 MG tablet [id number] Yes Take 1 tablet (10 mg total) by mouth daily. Vannie Reche RAMAN, NP  Active   carvedilol  (COREG ) 25 MG tablet [id number] Yes Take 1 tablet (25 mg total) by mouth 2 (two) times daily with a meal. Walker, Caitlin S, NP  Active   clotrimazole -betamethasone  (LOTRISONE ) cream [id number] Yes Apply 1 Application topically 2 (two) times daily.  Patient taking differently: Apply 1 Application topically 2 (two) times daily.   Fausto Burnard LABOR, DO  Active   Fe Fum-Vit C-Vit B12-FA (TRIGELS-F FORTE) CAPS capsule [id number] Yes Take 1 capsule by mouth daily  after breakfast. Fausto Burnard LABOR, DO  Active   hydrALAZINE  (APRESOLINE ) 100 MG tablet [id number] Yes Take 1 tablet (100 mg total) by mouth every 8 (eight) hours. Vannie Reche RAMAN, NP  Active   irbesartan  (AVAPRO ) 150 MG tablet [id number] Yes Take 1 tablet (150 mg total) by mouth daily. Lonni Slain, MD  Active   spironolactone  (ALDACTONE ) 25 MG tablet [id number] Yes Take 1 tablet (25 mg total) by mouth 2 (two) times daily. Vannie Reche RAMAN, NP  Active   Vitamin D , Ergocalciferol , (DRISDOL ) 1.25 MG (50000 UNIT) CAPS capsule [id number] Yes Take 1 capsule (50,000 Units total) by mouth every 7 (seven) days. Fausto Burnard LABOR, DO  Active             Recommendation:   PCP Follow-up Continue Current Plan of Care  Follow Up Plan:   Telephone follow-up in 1 month: 01/20/24 @ 11 am  Alayna Mabe, RN, SCIENTIST, RESEARCH (PHYSICAL SCIENCES), Theatre Manager [employer] [phone]

## 2024-01-07 ENCOUNTER — Inpatient Hospital Stay (INDEPENDENT_AMBULATORY_CARE_PROVIDER_SITE_OTHER): Admitting: Primary Care

## 2024-01-12 ENCOUNTER — Telehealth (INDEPENDENT_AMBULATORY_CARE_PROVIDER_SITE_OTHER): Payer: Self-pay | Admitting: Primary Care

## 2024-01-12 NOTE — Telephone Encounter (Signed)
 Left VM with pt about their upcoming appt. Pt did not answer

## 2024-01-13 ENCOUNTER — Ambulatory Visit (INDEPENDENT_AMBULATORY_CARE_PROVIDER_SITE_OTHER): Admitting: Primary Care

## 2024-01-13 ENCOUNTER — Encounter (INDEPENDENT_AMBULATORY_CARE_PROVIDER_SITE_OTHER): Payer: Self-pay | Admitting: Primary Care

## 2024-01-13 VITALS — BP 143/81 | HR 81 | Resp 16 | Wt >= 6400 oz

## 2024-01-13 DIAGNOSIS — E662 Morbid (severe) obesity with alveolar hypoventilation: Secondary | ICD-10-CM

## 2024-01-13 DIAGNOSIS — I890 Lymphedema, not elsewhere classified: Secondary | ICD-10-CM

## 2024-01-13 DIAGNOSIS — L089 Local infection of the skin and subcutaneous tissue, unspecified: Secondary | ICD-10-CM

## 2024-01-13 DIAGNOSIS — R600 Localized edema: Secondary | ICD-10-CM

## 2024-01-13 DIAGNOSIS — G4733 Obstructive sleep apnea (adult) (pediatric): Secondary | ICD-10-CM | POA: Diagnosis not present

## 2024-01-13 DIAGNOSIS — I10 Essential (primary) hypertension: Secondary | ICD-10-CM

## 2024-01-13 NOTE — Progress Notes (Signed)
 Subjective:   Jared Mccoy is a 33 y.o. adult presents for hospital follow up and establish care. Admit date to the hospital was 10/25/23, patient was discharged from the hospital on 10/30/23, patient was admitted for:  If  he sit still for few seconds patient falls asleep. Has OSA used CAP machine is broken. Past Medical History:  Diagnosis Date   Hypertension    not on medication   Obese    Peripheral vascular disease    edema in legs      Allergies  Allergen Reactions   Crab (Diagnostic) Diarrhea and Nausea And Vomiting    Reaction only to crab legs     Current Outpatient Medications on File Prior to Visit  Medication Sig Dispense Refill   acetaminophen  (TYLENOL ) 325 MG tablet Take 2 tablets (650 mg total) by mouth every 6 (six) hours as needed for mild pain (pain score 1-3) or fever (or Fever >/= 101). 20 tablet 0   amLODipine  (NORVASC ) 10 MG tablet Take 1 tablet (10 mg total) by mouth daily. 30 tablet 6   carvedilol  (COREG ) 25 MG tablet Take 1 tablet (25 mg total) by mouth 2 (two) times daily with a meal. 60 tablet 6   clotrimazole -betamethasone  (LOTRISONE ) cream Apply 1 Application topically 2 (two) times daily. (Patient taking differently: Apply 1 Application topically 2 (two) times daily.) 30 g 0   Fe Fum-Vit C-Vit B12-FA (TRIGELS-F FORTE) CAPS capsule Take 1 capsule by mouth daily after breakfast. 30 capsule 0   hydrALAZINE  (APRESOLINE ) 100 MG tablet Take 1 tablet (100 mg total) by mouth every 8 (eight) hours. 90 tablet 3   irbesartan  (AVAPRO ) 150 MG tablet Take 1 tablet (150 mg total) by mouth daily. 90 tablet 2   spironolactone  (ALDACTONE ) 25 MG tablet Take 1 tablet (25 mg total) by mouth 2 (two) times daily. 180 tablet 2   Vitamin D , Ergocalciferol , (DRISDOL ) 1.25 MG (50000 UNIT) CAPS capsule Take 1 capsule (50,000 Units total) by mouth every 7 (seven) days. 5 capsule 0   No current facility-administered medications on file prior to visit.    Review of  System: ROS Comprehensive ROS Pertinent positive and negative noted in HPI   Objective:  BP (!) 143/81   Pulse 81   Resp 16   Wt (!) 564 lb 6.4 oz (256 kg)   SpO2 94%   BMI 76.55 kg/m   Filed Weights   01/13/24 0935  Weight: (!) 564 lb 6.4 oz (256 kg)   Physical Exam Vitals reviewed.  Constitutional:      Appearance: Normal appearance. He is obese.     Comments: Severe morbid obesity   HENT:     Head: Normocephalic.     Right Ear: Tympanic membrane, ear canal and external ear normal.     Left Ear: Tympanic membrane, ear canal and external ear normal.     Nose: Nose normal.     Mouth/Throat:     Mouth: Mucous membranes are moist.  Eyes:     Extraocular Movements: Extraocular movements intact.     Pupils: Pupils are equal, round, and reactive to light.  Cardiovascular:     Rate and Rhythm: Normal rate.  Pulmonary:     Effort: Pulmonary effort is normal.     Breath sounds: Normal breath sounds.  Abdominal:     General: Bowel sounds are normal. There is distension.     Palpations: Abdomen is soft.  Musculoskeletal:        General:  Normal range of motion.     Cervical back: Normal range of motion and neck supple.  Skin:    General: Skin is warm and dry.  Neurological:     Mental Status: He is alert and oriented to person, place, and time.  Psychiatric:        Mood and Affect: Mood normal.        Behavior: Behavior normal.        Thought Content: Thought content normal.      Assessment:  Jared Mccoy was seen today for referral.  Diagnoses and all orders for this visit:  OSA (obstructive sleep apnea) 2/2 Obesity hypoventilation syndrome (HCC) Consulted with pulmonology awaiting response    Lymphedema 2/2 Wound infection  Spoke with Dr. Harden missed last appt due to hospitalization. Currently nurse changing dressing  pt question if getting better. Okay to send back appt scheduled 01/21/24 at 3:45 . Information given to patient   Leg edema 2/2 Lymphedema Dr. Harden  re-evaluate and tx- also asked onychogryphosis will cut down    Essential hypertension  Managed by cardiology   This note has been created with Dragon speech recognition software and smart phrase technology. Any transcriptional errors are unintentional.   Medicare in person visit   Jared SHAUNNA Bohr, NP 01/13/2024, 1:00 PM

## 2024-01-20 ENCOUNTER — Ambulatory Visit: Admitting: Orthopedic Surgery

## 2024-01-20 ENCOUNTER — Other Ambulatory Visit: Payer: Self-pay | Admitting: *Deleted

## 2024-01-20 NOTE — Patient Instructions (Signed)
 Jared Mccoy - I am sorry I was unable to reach you today for our scheduled appointment. I work with Celestia Rosaline SQUIBB, NP and am calling to support your healthcare needs. I have rescheduled appointment 03/04/24 @ 9:30 am.   Please contact me at [phone] at your earliest convenience. I look forward to speaking with you soon.   Thank you,  Haziel Molner, RN, BSN, ACM RN Care Manager [employer] [phone]

## 2024-01-21 NOTE — Progress Notes (Signed)
 GURFATEH MCCLAIN                                          MRN: [id number]   01/21/2024   The [facility] Quality Team Specialist reviewed this patient medical record for the purposes of chart review for care gap closure. The following were reviewed: abstraction for care gap closure-transition of care:notification of admission.    [facility] Quality Team

## 2024-02-08 ENCOUNTER — Inpatient Hospital Stay (HOSPITAL_COMMUNITY)

## 2024-02-08 ENCOUNTER — Other Ambulatory Visit: Payer: Self-pay

## 2024-02-08 ENCOUNTER — Emergency Department (HOSPITAL_COMMUNITY)

## 2024-02-08 ENCOUNTER — Inpatient Hospital Stay (HOSPITAL_COMMUNITY)
Admission: EM | Admit: 2024-02-08 | Discharge: 2024-02-15 | DRG: 193 | Discharge status: Against Medical Advice | Attending: Internal Medicine | Admitting: Internal Medicine

## 2024-02-08 DIAGNOSIS — Z7409 Other reduced mobility: Secondary | ICD-10-CM | POA: Diagnosis present

## 2024-02-08 DIAGNOSIS — E66813 Obesity, class 3: Secondary | ICD-10-CM | POA: Diagnosis present

## 2024-02-08 DIAGNOSIS — E662 Morbid (severe) obesity with alveolar hypoventilation: Secondary | ICD-10-CM | POA: Diagnosis present

## 2024-02-08 DIAGNOSIS — Z5329 Procedure and treatment not carried out because of patient's decision for other reasons: Secondary | ICD-10-CM | POA: Diagnosis present

## 2024-02-08 DIAGNOSIS — R0602 Shortness of breath: Secondary | ICD-10-CM | POA: Diagnosis not present

## 2024-02-08 DIAGNOSIS — I160 Hypertensive urgency: Secondary | ICD-10-CM | POA: Diagnosis present

## 2024-02-08 DIAGNOSIS — R051 Acute cough: Secondary | ICD-10-CM | POA: Diagnosis not present

## 2024-02-08 DIAGNOSIS — J9621 Acute and chronic respiratory failure with hypoxia: Secondary | ICD-10-CM | POA: Diagnosis present

## 2024-02-08 DIAGNOSIS — J189 Pneumonia, unspecified organism: Secondary | ICD-10-CM | POA: Diagnosis present

## 2024-02-08 DIAGNOSIS — F17210 Nicotine dependence, cigarettes, uncomplicated: Secondary | ICD-10-CM | POA: Diagnosis present

## 2024-02-08 DIAGNOSIS — J9622 Acute and chronic respiratory failure with hypercapnia: Secondary | ICD-10-CM | POA: Diagnosis present

## 2024-02-08 DIAGNOSIS — N179 Acute kidney failure, unspecified: Secondary | ICD-10-CM | POA: Diagnosis present

## 2024-02-08 DIAGNOSIS — J188 Other pneumonia, unspecified organism: Secondary | ICD-10-CM | POA: Diagnosis not present

## 2024-02-08 DIAGNOSIS — Z91013 Allergy to seafood: Secondary | ICD-10-CM

## 2024-02-08 DIAGNOSIS — Z79899 Other long term (current) drug therapy: Secondary | ICD-10-CM | POA: Diagnosis not present

## 2024-02-08 DIAGNOSIS — I77810 Thoracic aortic ectasia: Secondary | ICD-10-CM | POA: Diagnosis present

## 2024-02-08 DIAGNOSIS — I272 Pulmonary hypertension, unspecified: Secondary | ICD-10-CM | POA: Diagnosis present

## 2024-02-08 DIAGNOSIS — I110 Hypertensive heart disease with heart failure: Secondary | ICD-10-CM | POA: Diagnosis present

## 2024-02-08 DIAGNOSIS — I890 Lymphedema, not elsewhere classified: Secondary | ICD-10-CM | POA: Diagnosis present

## 2024-02-08 DIAGNOSIS — I10 Essential (primary) hypertension: Secondary | ICD-10-CM | POA: Diagnosis present

## 2024-02-08 DIAGNOSIS — R0902 Hypoxemia: Principal | ICD-10-CM

## 2024-02-08 DIAGNOSIS — I5031 Acute diastolic (congestive) heart failure: Secondary | ICD-10-CM | POA: Insufficient documentation

## 2024-02-08 DIAGNOSIS — I5033 Acute on chronic diastolic (congestive) heart failure: Secondary | ICD-10-CM | POA: Diagnosis present

## 2024-02-08 DIAGNOSIS — Z6841 Body Mass Index (BMI) 40.0 and over, adult: Secondary | ICD-10-CM | POA: Diagnosis not present

## 2024-02-08 DIAGNOSIS — J9602 Acute respiratory failure with hypercapnia: Secondary | ICD-10-CM | POA: Diagnosis not present

## 2024-02-08 DIAGNOSIS — G4733 Obstructive sleep apnea (adult) (pediatric): Secondary | ICD-10-CM | POA: Diagnosis not present

## 2024-02-08 DIAGNOSIS — J9601 Acute respiratory failure with hypoxia: Secondary | ICD-10-CM | POA: Diagnosis not present

## 2024-02-08 DIAGNOSIS — Z7984 Long term (current) use of oral hypoglycemic drugs: Secondary | ICD-10-CM

## 2024-02-08 DIAGNOSIS — I480 Paroxysmal atrial fibrillation: Secondary | ICD-10-CM | POA: Diagnosis present

## 2024-02-08 DIAGNOSIS — F1410 Cocaine abuse, uncomplicated: Secondary | ICD-10-CM | POA: Diagnosis present

## 2024-02-08 DIAGNOSIS — R600 Localized edema: Secondary | ICD-10-CM

## 2024-02-08 DIAGNOSIS — E876 Hypokalemia: Secondary | ICD-10-CM | POA: Diagnosis not present

## 2024-02-08 DIAGNOSIS — I739 Peripheral vascular disease, unspecified: Secondary | ICD-10-CM | POA: Diagnosis present

## 2024-02-08 LAB — TROPONIN T, HIGH SENSITIVITY: Troponin T High Sensitivity: 15 ng/L (ref 0–19)

## 2024-02-08 LAB — I-STAT VENOUS BLOOD GAS, ED
Acid-Base Excess: 7 mmol/L — ABNORMAL HIGH (ref 0.0–2.0)
Acid-Base Excess: 8 mmol/L — ABNORMAL HIGH (ref 0.0–2.0)
Bicarbonate: 35.6 mmol/L — ABNORMAL HIGH (ref 20.0–28.0)
Bicarbonate: 33.4 mmol/L — ABNORMAL HIGH (ref 20.0–28.0)
Calcium, Ion: 1.08 mmol/L — ABNORMAL LOW (ref 1.15–1.40)
Calcium, Ion: 1.01 mmol/L — ABNORMAL LOW (ref 1.15–1.40)
HCT: 36 % — ABNORMAL LOW (ref 39.0–52.0)
HCT: 36 % — ABNORMAL LOW (ref 39.0–52.0)
Hemoglobin: 12.2 g/dL — ABNORMAL LOW (ref 13.0–17.0)
Hemoglobin: 12.2 g/dL — ABNORMAL LOW (ref 13.0–17.0)
O2 Saturation: 73 %
O2 Saturation: 96 %
Potassium: 3.5 mmol/L (ref 3.5–5.1)
Potassium: 3.7 mmol/L (ref 3.5–5.1)
Sodium: 141 mmol/L (ref 135–145)
Sodium: 140 mmol/L (ref 135–145)
TCO2: 38 mmol/L — ABNORMAL HIGH (ref 22–32)
TCO2: 35 mmol/L — ABNORMAL HIGH (ref 22–32)
pCO2, Ven: 73.1 mmHg (ref 44–60)
pCO2, Ven: 46.7 mmHg (ref 44–60)
pH, Ven: 7.295 (ref 7.25–7.43)
pH, Ven: 7.462 — ABNORMAL HIGH (ref 7.25–7.43)
pO2, Ven: 45 mmHg (ref 32–45)
pO2, Ven: 81 mmHg — ABNORMAL HIGH (ref 32–45)

## 2024-02-08 LAB — IRON AND TIBC
Iron: 21 ug/dL — ABNORMAL LOW (ref 45–182)
Saturation Ratios: 6 % — ABNORMAL LOW (ref 17.9–39.5)
TIBC: 377 ug/dL (ref 250–450)
UIBC: 356 ug/dL

## 2024-02-08 LAB — CBC
HCT: 36.2 % — ABNORMAL LOW (ref 39.0–52.0)
Hemoglobin: 10.5 g/dL — ABNORMAL LOW (ref 13.0–17.0)
MCH: 23.2 pg — ABNORMAL LOW (ref 26.0–34.0)
MCHC: 29 g/dL — ABNORMAL LOW (ref 30.0–36.0)
MCV: 80.1 fL (ref 80.0–100.0)
Platelets: 251 K/uL (ref 150–400)
RBC: 4.52 MIL/uL (ref 4.22–5.81)
RDW: 19.1 % — ABNORMAL HIGH (ref 11.5–15.5)
WBC: 9 K/uL (ref 4.0–10.5)
nRBC: 0 % (ref 0.0–0.2)

## 2024-02-08 LAB — URINE DRUG SCREEN
Amphetamines: NEGATIVE
Barbiturates: NEGATIVE
Benzodiazepines: NEGATIVE
Cocaine: POSITIVE — AB
Fentanyl: NEGATIVE
Methadone Scn, Ur: NEGATIVE
Opiates: NEGATIVE
Tetrahydrocannabinol: NEGATIVE

## 2024-02-08 LAB — ABO/RH: ABO/RH(D): O POS

## 2024-02-08 LAB — BASIC METABOLIC PANEL WITH GFR
Anion gap: 7 (ref 5–15)
BUN: 13 mg/dL (ref 6–20)
CO2: 31 mmol/L (ref 22–32)
Calcium: 8.2 mg/dL — ABNORMAL LOW (ref 8.9–10.3)
Chloride: 99 mmol/L (ref 98–111)
Creatinine, Ser: 1.39 mg/dL — ABNORMAL HIGH (ref 0.61–1.24)
GFR, Estimated: >60 mL/min
Glucose, Bld: 98 mg/dL (ref 70–99)
Potassium: 3.6 mmol/L (ref 3.5–5.1)
Sodium: 137 mmol/L (ref 135–145)

## 2024-02-08 LAB — HEPATIC FUNCTION PANEL
ALT: 11 U/L (ref 0–44)
AST: 20 U/L (ref 15–41)
Albumin: 3.4 g/dL — ABNORMAL LOW (ref 3.5–5.0)
Alkaline Phosphatase: 78 U/L (ref 38–126)
Bilirubin, Direct: 0.2 mg/dL (ref 0.0–0.2)
Indirect Bilirubin: 0.3 mg/dL (ref 0.3–0.9)
Total Bilirubin: 0.4 mg/dL (ref 0.0–1.2)
Total Protein: 7.2 g/dL (ref 6.5–8.1)

## 2024-02-08 LAB — TYPE AND SCREEN
ABO/RH(D): O POS
Antibody Screen: NEGATIVE

## 2024-02-08 LAB — RETICULOCYTES
Immature Retic Fract: 21.8 % — ABNORMAL HIGH (ref 2.3–15.9)
RBC.: 4.8 MIL/uL (ref 4.22–5.81)
Retic Count, Absolute: 106.6 K/uL (ref 19.0–186.0)
Retic Ct Pct: 2.2 % (ref 0.4–3.1)

## 2024-02-08 LAB — BLOOD GAS, VENOUS
Acid-Base Excess: 7.9 mmol/L — ABNORMAL HIGH (ref 0.0–2.0)
Bicarbonate: 36.4 mmol/L — ABNORMAL HIGH (ref 20.0–28.0)
O2 Saturation: 99.5 %
Patient temperature: 37
pCO2, Ven: 69 mmHg — ABNORMAL HIGH (ref 44–60)
pH, Ven: 7.33 (ref 7.25–7.43)
pO2, Ven: 142 mmHg — ABNORMAL HIGH (ref 32–45)

## 2024-02-08 LAB — PRO BRAIN NATRIURETIC PEPTIDE: Pro Brain Natriuretic Peptide: 239 pg/mL

## 2024-02-08 LAB — HEMOGLOBIN A1C
Hgb A1c MFr Bld: 5.7 % — ABNORMAL HIGH (ref 4.8–5.6)
Mean Plasma Glucose: 116.89 mg/dL

## 2024-02-08 LAB — FOLATE: Folate: 13.1 ng/mL

## 2024-02-08 LAB — FERRITIN: Ferritin: 102 ng/mL (ref 24–336)

## 2024-02-08 LAB — ETHANOL: Alcohol, Ethyl (B): <15 mg/dL

## 2024-02-08 LAB — PROCALCITONIN: Procalcitonin: <0.1 ng/mL

## 2024-02-08 LAB — MAGNESIUM: Magnesium: 2 mg/dL (ref 1.7–2.4)

## 2024-02-08 LAB — VITAMIN B12: Vitamin B-12: 583 pg/mL (ref 180–914)

## 2024-02-08 LAB — CBG MONITORING, ED: Glucose-Capillary: 100 mg/dL — ABNORMAL HIGH (ref 70–99)

## 2024-02-08 MED ORDER — FUROSEMIDE 10 MG/ML IJ SOLN
20.0000 mg | Freq: Two times a day (BID) | INTRAMUSCULAR | Status: AC
  Administered 2024-02-08 – 2024-02-09 (×2): 20 mg via INTRAVENOUS
  Filled 2024-02-08: qty 2

## 2024-02-08 MED ORDER — PIPERACILLIN-TAZOBACTAM 3.375 G IVPB 30 MIN
3.3750 g | Freq: Once | INTRAVENOUS | Status: AC
  Administered 2024-02-08: 3.375 g via INTRAVENOUS
  Filled 2024-02-08: qty 50

## 2024-02-08 MED ORDER — IOHEXOL 350 MG/ML SOLN
110.0000 mL | Freq: Once | INTRAVENOUS | Status: AC | PRN
  Administered 2024-02-08: 110 mL via INTRAVENOUS

## 2024-02-08 MED ORDER — ACETAMINOPHEN 325 MG PO TABS
650.0000 mg | ORAL_TABLET | Freq: Four times a day (QID) | ORAL | Status: DC | PRN
  Administered 2024-02-11: 650 mg via ORAL

## 2024-02-08 MED ORDER — DOCUSATE SODIUM 100 MG PO CAPS
100.0000 mg | ORAL_CAPSULE | Freq: Two times a day (BID) | ORAL | Status: DC | PRN
  Administered 2024-02-11: 100 mg via ORAL

## 2024-02-08 MED ORDER — HYDRALAZINE HCL 20 MG/ML IJ SOLN
5.0000 mg | INTRAMUSCULAR | Status: DC | PRN
  Administered 2024-02-08 – 2024-02-10 (×4): 5 mg via INTRAVENOUS
  Filled 2024-02-08: qty 1

## 2024-02-08 MED ORDER — INSULIN ASPART 100 UNIT/ML IJ SOLN: 0.0000 [IU] | Freq: Every day | INTRAMUSCULAR | Status: DC

## 2024-02-08 MED ORDER — PIPERACILLIN-TAZOBACTAM 3.375 G IVPB
3.3750 g | Freq: Three times a day (TID) | INTRAVENOUS | Status: DC
  Administered 2024-02-09 – 2024-02-12 (×10): 3.375 g via INTRAVENOUS

## 2024-02-08 MED ORDER — HYDRALAZINE HCL 50 MG PO TABS
100.0000 mg | ORAL_TABLET | Freq: Three times a day (TID) | ORAL | Status: DC
  Administered 2024-02-08 – 2024-02-11 (×7): 100 mg via ORAL
  Filled 2024-02-08: qty 2

## 2024-02-08 MED ORDER — THIAMINE HCL 100 MG/ML IJ SOLN
100.0000 mg | INTRAMUSCULAR | Status: DC
  Administered 2024-02-08: 100 mg via INTRAVENOUS
  Filled 2024-02-08: qty 2

## 2024-02-08 MED ORDER — LABETALOL HCL 5 MG/ML IV SOLN
10.0000 mg | Freq: Once | INTRAVENOUS | Status: AC
  Administered 2024-02-08: 10 mg via INTRAVENOUS
  Filled 2024-02-08: qty 4

## 2024-02-08 MED ORDER — SODIUM CHLORIDE 0.9% FLUSH
3.0000 mL | Freq: Two times a day (BID) | INTRAVENOUS | Status: DC
  Administered 2024-02-08 – 2024-02-14 (×12): 3 mL via INTRAVENOUS

## 2024-02-08 MED ORDER — CARVEDILOL 12.5 MG PO TABS
25.0000 mg | ORAL_TABLET | Freq: Two times a day (BID) | ORAL | Status: DC
  Administered 2024-02-08 – 2024-02-09 (×2): 25 mg via ORAL
  Filled 2024-02-08: qty 2

## 2024-02-08 MED ORDER — INSULIN ASPART 100 UNIT/ML IJ SOLN: 0.0000 [IU] | Freq: Three times a day (TID) | INTRAMUSCULAR | Status: DC

## 2024-02-08 MED ORDER — FUROSEMIDE 10 MG/ML IJ SOLN: 40.0000 mg | Freq: Two times a day (BID) | INTRAMUSCULAR | Status: DC

## 2024-02-08 MED ORDER — FUROSEMIDE 10 MG/ML IJ SOLN
20.0000 mg | Freq: Once | INTRAMUSCULAR | Status: AC
  Administered 2024-02-08: 20 mg via INTRAVENOUS
  Filled 2024-02-08: qty 2

## 2024-02-08 MED ORDER — OXYCODONE HCL 5 MG PO TABS
5.0000 mg | ORAL_TABLET | ORAL | Status: DC | PRN
  Administered 2024-02-08 – 2024-02-11 (×2): 5 mg via ORAL
  Filled 2024-02-08: qty 1

## 2024-02-08 MED ORDER — ACETAMINOPHEN 650 MG RE SUPP: 650.0000 mg | Freq: Four times a day (QID) | RECTAL | Status: DC | PRN

## 2024-02-08 MED ORDER — HEPARIN SODIUM (PORCINE) 5000 UNIT/ML IJ SOLN
5000.0000 [IU] | Freq: Three times a day (TID) | INTRAMUSCULAR | Status: DC
  Administered 2024-02-08 – 2024-02-15 (×21): 5000 [IU] via SUBCUTANEOUS
  Filled 2024-02-08 (×2): qty 1

## 2024-02-08 NOTE — ED Triage Notes (Signed)
 Patient arrives via [facility] for shob, hx of copd and chf. 86 on room air. Placed on 6LNC 97%, crackles in all fields. Not on lasix  per patient. Complaining of leg tightness. Lethargic. CBG 326.   180 palp BP HR 80 20 RR

## 2024-02-08 NOTE — ED Provider Notes (Signed)
 " [place] EMERGENCY DEPARTMENT AT [facility] Provider Note   CSN: [id number] Arrival date & time: 02/08/24  9148     Patient presents with: Shortness of Breath   Jared Mccoy is a 33 y.o. adult.   The history is provided by the patient and medical records. No language interpreter was used.  Shortness of Breath Severity:  Severe Onset quality:  Gradual Duration:  3 days Timing:  Constant Progression:  Worsening Chronicity:  New Context: URI   Relieved by:  Nothing Worsened by:  Nothing Ineffective treatments:  None tried Associated symptoms: cough and sputum production   Associated symptoms: no abdominal pain, no chest pain, no fever, no headaches, no neck pain, no rash and no wheezing   Risk factors: obesity        Prior to Admission medications  Medication Sig Start Date End Date Taking? Authorizing Provider  acetaminophen  (TYLENOL ) 325 MG tablet Take 2 tablets (650 mg total) by mouth every 6 (six) hours as needed for mild pain (pain score 1-3) or fever (or Fever >/= 101). 09/04/23   Sheikh, Omair Latif, DO  amLODipine  (NORVASC ) 10 MG tablet Take 1 tablet (10 mg total) by mouth daily. 11/28/23   Vannie Reche RAMAN, NP  carvedilol  (COREG ) 25 MG tablet Take 1 tablet (25 mg total) by mouth 2 (two) times daily with a meal. 11/28/23   Walker, Caitlin S, NP  clotrimazole -betamethasone  (LOTRISONE ) cream Apply 1 Application topically 2 (two) times daily. 10/30/23   Fausto Burnard LABOR, DO  Fe Fum-Vit C-Vit B12-FA (TRIGELS-F FORTE) CAPS capsule Take 1 capsule by mouth daily after breakfast. 10/31/23   Fausto Burnard A, DO  hydrALAZINE  (APRESOLINE ) 100 MG tablet Take 1 tablet (100 mg total) by mouth every 8 (eight) hours. 11/28/23   Vannie Reche RAMAN, NP  irbesartan  (AVAPRO ) 150 MG tablet Take 1 tablet (150 mg total) by mouth daily. 12/04/23   Lonni Slain, MD  spironolactone  (ALDACTONE ) 25 MG tablet Take 1 tablet (25 mg total) by mouth 2 (two) times daily.  12/01/23   Vannie Reche RAMAN, NP  Vitamin D , Ergocalciferol , (DRISDOL ) 1.25 MG (50000 UNIT) CAPS capsule Take 1 capsule (50,000 Units total) by mouth every 7 (seven) days. 11/03/23   Fausto Burnard LABOR, DO    Allergies: Crab (diagnostic)    Review of Systems  Constitutional:  Positive for chills and fatigue. Negative for fever.  HENT:  Positive for congestion.   Respiratory:  Positive for cough, sputum production, chest tightness and shortness of breath. Negative for wheezing and stridor.   Cardiovascular:  Positive for leg swelling. Negative for chest pain and palpitations.  Gastrointestinal:  Negative for abdominal pain, constipation, diarrhea and nausea.  Genitourinary:  Negative for dysuria.  Musculoskeletal:  Negative for back pain and neck pain.  Skin:  Negative for rash.  Neurological:  Negative for light-headedness, numbness and headaches.  All other systems reviewed and are negative.   Updated Vital Signs BP (!) 216/114 (BP Location: Left Wrist)   Pulse 78   Temp 99 F (37.2 C) (Oral)   Resp 20   Ht 6' (1.829 m)   Wt (!) 256 kg   SpO2 100%   BMI 76.54 kg/m   Physical Exam Vitals and nursing note reviewed.  Constitutional:      General: He is not in acute distress.    Appearance: He is well-developed. He is not ill-appearing, toxic-appearing or diaphoretic.  HENT:     Head: Normocephalic and atraumatic.  Right Ear: External ear normal.     Left Ear: External ear normal.     Nose: Nose normal.     Mouth/Throat:     Pharynx: No oropharyngeal exudate.  Eyes:     Conjunctiva/sclera: Conjunctivae normal.     Pupils: Pupils are equal, round, and reactive to light.  Cardiovascular:     Rate and Rhythm: Normal rate.  Pulmonary:     Effort: Tachypnea present. No respiratory distress.     Breath sounds: No stridor. Rhonchi and rales present.  Chest:     Chest wall: No tenderness.  Abdominal:     General: There is no distension.     Tenderness: There is no  abdominal tenderness. There is no rebound.  Musculoskeletal:     Cervical back: Normal range of motion and neck supple.     Right lower leg: Edema present.     Left lower leg: Edema present.  Skin:    General: Skin is warm.     Findings: No erythema or rash.  Neurological:     Mental Status: He is alert and oriented to person, place, and time.     Motor: No abnormal muscle tone.     Coordination: Coordination normal.     Deep Tendon Reflexes: Reflexes are normal and symmetric.     (all labs ordered are listed, but only abnormal results are displayed) Labs Reviewed  BASIC METABOLIC PANEL WITH GFR - Abnormal; Notable for the following components:      Result Value   Creatinine, Ser 1.39 (*)    Calcium 8.2 (*)    All other components within normal limits  CBC - Abnormal; Notable for the following components:   Hemoglobin 10.5 (*)    HCT 36.2 (*)    MCH 23.2 (*)    MCHC 29.0 (*)    RDW 19.1 (*)    All other components within normal limits  HEPATIC FUNCTION PANEL - Abnormal; Notable for the following components:   Albumin 3.4 (*)    All other components within normal limits  I-STAT VENOUS BLOOD GAS, ED - Abnormal; Notable for the following components:   pCO2, Ven 73.1 (*)    Bicarbonate 35.6 (*)    TCO2 38 (*)    Acid-Base Excess 7.0 (*)    Calcium, Ion 1.08 (*)    HCT 36.0 (*)    Hemoglobin 12.2 (*)    All other components within normal limits  RESP PANEL BY RT-PCR (RSV, FLU A&B, COVID)  RVPGX2  PRO BRAIN NATRIURETIC PEPTIDE  MAGNESIUM   URINE DRUG SCREEN  ETHANOL  HEMOGLOBIN A1C  I-STAT ARTERIAL BLOOD GAS, ED  TROPONIN T, HIGH SENSITIVITY    EKG: EKG Interpretation Date/Time:  Sunday February 08 2024 09:20:19 EST Ventricular Rate:  77 PR Interval:  142 QRS Duration:  92 QT Interval:  424 QTC Calculation: 479 R Axis:   94  Text Interpretation: Normal sinus rhythm Rightward axis Borderline ECG When compared with ECG of 26-Oct-2023 00:47, PREVIOUS ECG IS PRESENT  when compared top rior, similar appearance No STEMI Confirmed by Ginger Barefoot ([id number]) on 02/08/2024 9:49:44 AM  Radiology: [facility] Chest Portable 1 View Result Date: 02/08/2024 CLINICAL DATA:  Chest tightness and shortness of breath. EXAM: PORTABLE CHEST 1 VIEW COMPARISON:  October 25, 2023 FINDINGS: The cardiac silhouette is mildly enlarged which may be, in part, technical in origin. There is moderate severity prominence of the perihilar and hilar pulmonary vasculature. This is increased in severity when compared to the  prior study. No acute infiltrate, pleural effusion or pneumothorax is identified. The visualized skeletal structures are unremarkable. IMPRESSION: Mild cardiomegaly with moderate severity pulmonary vascular congestion. Electronically Signed   By: Suzen Dials M.D.   On: 02/08/2024 11:31     Procedures   CRITICAL CARE Performed by: Lonni PARAS Bless Belshe Total critical care time: 30 minutes Critical care time was exclusive of separately billable procedures and treating other patients. Critical care was necessary to treat or prevent imminent or life-threatening deterioration. Critical care was time spent personally by me on the following activities: development of treatment plan with patient and/or surrogate as well as nursing, discussions with consultants, evaluation of patient's response to treatment, examination of patient, obtaining history from patient or surrogate, ordering and performing treatments and interventions, ordering and review of laboratory studies, ordering and review of radiographic studies, pulse oximetry and re-evaluation of patient's condition.   Medications Ordered in the ED  thiamine  (VITAMIN B1) injection 100 mg (100 mg Intravenous Given 02/08/24 1516)  sodium chloride  flush (NS) 0.9 % injection 3 mL (3 mLs Intravenous Given 02/08/24 1516)  acetaminophen  (TYLENOL ) tablet 650 mg (has no administration in time range)    Or  acetaminophen  (TYLENOL )  suppository 650 mg (has no administration in time range)  docusate sodium  (COLACE) capsule 100 mg (has no administration in time range)  hydrALAZINE  (APRESOLINE ) injection 5 mg (has no administration in time range)  heparin  injection 5,000 Units (5,000 Units Subcutaneous Given 02/08/24 1515)  oxyCODONE  (Oxy IR/ROXICODONE ) immediate release tablet 5 mg (has no administration in time range)  carvedilol  (COREG ) tablet 25 mg (has no administration in time range)  hydrALAZINE  (APRESOLINE ) tablet 100 mg (100 mg Oral Given 02/08/24 1515)  furosemide  (LASIX ) injection 20 mg (has no administration in time range)  labetalol  (NORMODYNE ) injection 10 mg (10 mg Intravenous Given 02/08/24 1250)  furosemide  (LASIX ) injection 20 mg (20 mg Intravenous Given 02/08/24 1250)  iohexol  (OMNIPAQUE ) 350 MG/ML injection 110 mL (110 mLs Intravenous Contrast Given 02/08/24 1446)                                    Medical Decision Making Amount and/or Complexity of Data Reviewed Labs: ordered.  Risk Prescription drug management. Decision regarding hospitalization.    Jared Mccoy is a 33 y.o. adult with a past medical history significant for chronic lymphedema, hypertension, sleep apnea, prior atrial fibrillation, peripheral vascular disease, and obesity who presents for hypoxia and respiratory distress.  According to patient, for the last few days he has had worsening shortness of breath as well as congestion and cough.  He has had some chills but no documented fevers.  He reports no stabbing discomfort but does report some chest pressure.  He reports it is exertional.  He cannot lay flat due to feeling like he cannot breathe.  He reports his edema in his legs is slightly worse than baseline.  He denies any trauma.  He denies abdominal pain.  Denies nausea, vomiting, constipation, diarrhea, or urinary changes.  He reports that they took him off his Lasix  a while back.  He is unsure of sick contacts.  Denies  headache or neck pain.  Just feels tired.  Patient brought in on 6 L nasal cannula and was found to be hypoxic with EMS in the low 80s.  When we tried to de-escalate him he dropped into the 80s again and he is on 4 L  now.  He does not have an oxygen requirement at baseline.  On exam, he does have rales and rhonchi in all lung fields.  He did not have significant wheezing initially.  Chest is nontender.  Abdomen nontender.  He has lymphedema and swelling in both legs.  Felt pulses arms and felt pulses in his legs as well.  Patient moving extremities.  Patient's blood pressure initially was around 150 although it was over 200 with EMS.  During his time here it has gone back into the 190s so we will give some IV blood pressure medication.  His kidney function does not appear critically elevated so we will give some Lasix  as well as he clearly seems to have fluid in his lungs.  Workup began to return.  Patient does have a normal troponin will trend.  BNP not elevated.  Kidney function slightly more elevated than prior but not critically so.  No leukocytosis.  Mild anemia.   X-ray shows pulmonary vascular congestion but no clear pneumonia.  Will call for admission.   12:49 PM Spoke to medicine who will admit for further management.      Final diagnoses:  Hypoxia  SOB (shortness of breath)  Acute cough  Peripheral edema   Clinical Impression: 1. Hypoxia   2. SOB (shortness of breath)   3. Acute cough   4. Peripheral edema     Disposition: Admit  This note was prepared with assistance of Jared voice recognition software. Occasional wrong-word or sound-a-like substitutions may have occurred due to the inherent limitations of voice recognition software.       Nicklaus Alviar, Lonni PARAS, MD 02/08/24 1548  "

## 2024-02-08 NOTE — Progress Notes (Deleted)
 RT attempted to place patient on BIPAP, but patient refused. Patient placed on 4L [place] with sats in the upper 90s. BIPAP on standby ready at bedside. RN made aware.

## 2024-02-08 NOTE — ED Provider Triage Note (Signed)
 Emergency Medicine Provider Triage Evaluation Note  Jared Mccoy , a 33 y.o. adult  was evaluated in triage.  Pt complains of shortness of breath and chest tightness for the past 2 to 3 days.  Does report a productive cough.  Denies any fever, chills, or other accompanying symptoms.  Does feel his legs been more swollen over the past few days.  Reports compliance to his medications.  Do not see blood thinner on the list.  History of CHF, sleep apnea, and paroxysmal A-fib.  Patient was found to be hypoxic with EMS, now on 6 L.  Review of Systems  Positive:  Negative:   Physical Exam  BP (!) 216/114 (BP Location: Left Wrist)   Pulse 78   Temp 99 F (37.2 C) (Oral)   Resp 20   Ht 6' (1.829 m)   Wt (!) 256 kg   SpO2 100%   BMI 76.54 kg/m  Gen:   Lethargic Resp:  Normal effort, on [place]. Coarse breath sounds bilaterally   Medical Decision Making  Medically screening exam initiated at 9:47 AM.  Appropriate orders placed.  Jared Mccoy was informed that the remainder of the evaluation will be completed by another provider, this initial triage assessment does not replace that evaluation, and the importance of remaining in the ED until their evaluation is complete.  Patient was obviously lethargic, was able to answer orientation questions however had to be physically awakened.  He does not appear to be in acute respiratory distress however in concern for the hypoxia and his overall appearance.  Patient is being rude immediately to room 34.   Jared Mccoy, [place] 02/08/24 [phone]

## 2024-02-08 NOTE — ED Notes (Signed)
 Urine drug screen sent.

## 2024-02-08 NOTE — ED Notes (Signed)
" °   02/08/24 1156  Vitals  Pulse Rate 87  ECG Heart Rate 92  Resp (!) 31  MEWS COLOR  MEWS Score Color Yellow  Oxygen Therapy  SpO2 (!) 84 % pt removed [place] . Placed him back on [place] @ 4lpm O2  O2 Device Room Air  MEWS Score  MEWS Temp 0  MEWS Systolic 0  MEWS Pulse 0  MEWS RR 2  MEWS LOC 0  MEWS Score 2    "

## 2024-02-08 NOTE — ED Notes (Signed)
 Pt presenting more lethargic than prior assessment. Admitting made aware. New orders obtained and RT at the bedside

## 2024-02-08 NOTE — H&P (Signed)
 " History and Physical    Patient: Jared Mccoy FMW:[id number] DOB: 1990/11/20 DOA: 02/08/2024 DOS: the patient was seen and examined on 02/08/2024 . PCP: Celestia Rosaline SQUIBB, NP  Patient coming from: Home Chief complaint: Chief Complaint  Patient presents with   Shortness of Breath   HPI:  Jared Mccoy is a 33 y.o. adult with past medical history  of  Morbid obesity with OSA, paroxysmal A-fib not on AC, HTN, chronic lower extremity edema  from lymphedema and  coming for sob and hypoxia. Pt is somnolent at bedside and hpi is per chart review.Pt does not acknowledge any chest pian or abd pain.   ED Course:  Vital signs in the ED were notable for the following:  Vitals:   02/08/24 1200 02/08/24 1215 02/08/24 1245 02/08/24 1513  BP: (!) 191/107 (!) 203/107 (!) 130/108   Pulse: 81 78 78   Temp:    98.3 F (36.8 C)  Resp: (!) 22 17 (!) 21   Height:      Weight:      SpO2: 98% 99% 100%   TempSrc:    Oral  BMI (Calculated):       >>ED evaluation thus far shows: -EKG shows sinus rhythm at 77 PR 142 QTc of 479 no ST-T wave changes.  proBNP 239, troponin of 15. - CMP shows AKI with creatinine 1.39 eGFR more than 60 LFTs within normal limits calcium 8.2 albumin 3.4. - CBC shows normal white count hemoglobin 10.5 platelets 251. - UDS ordered and pending.   - Next magnesium  ordered and pending. - Respiratory panel ordered and pending.   >>While in the ED patient received the following: Medications  labetalol  (NORMODYNE ) injection 10 mg (has no administration in time range)  furosemide  (LASIX ) injection 20 mg (has no administration in time range)   Review of Systems  Unable to perform ROS: Other (somnolent but oriented.)   Past Medical History:  Diagnosis Date   Hypertension    not on medication   Obese    Peripheral vascular disease    edema in legs    Past Surgical History:  Procedure Laterality Date   MANDIBULAR HARDWARE REMOVAL N/A 12/27/2016   Procedure:  MANDIBULAR HARDWARE REMOVAL;  Surgeon: Karis Clunes, MD;  Location: [facility] OR;  Service: ENT;  Laterality: N/A;   OPEN REDUCTION INTERNAL FIXATION (ORIF) DISTAL RADIAL FRACTURE Left 11/10/2016   Procedure: OPEN REDUCTION INTERNAL FIXATION (ORIF) DISTAL RADIAL FRACTURE;  Surgeon: Sebastian Lenis, MD;  Location: [facility] OR;  Service: Orthopedics;  Laterality: Left;   ORIF MANDIBULAR FRACTURE Right 11/10/2016   Procedure: OPEN REDUCTION INTERNAL FIXATION (ORIF) MANDIBULAR FRACTURE, Ear Laceration Repair;  Surgeon: Karis Clunes, MD;  Location: [facility] OR;  Service: ENT;  Laterality: Right;    reports that he has been smoking cigarettes. He has a 10 pack-year smoking history. He has never used smokeless tobacco. He reports current alcohol use. He reports that he does not use drugs. Allergies[1] No family history on file. Prior to Admission medications  Medication Sig Start Date End Date Taking? Authorizing Provider  acetaminophen  (TYLENOL ) 325 MG tablet Take 2 tablets (650 mg total) by mouth every 6 (six) hours as needed for mild pain (pain score 1-3) or fever (or Fever >/= 101). 09/04/23   Sheikh, Omair Latif, DO  amLODipine  (NORVASC ) 10 MG tablet Take 1 tablet (10 mg total) by mouth daily. 11/28/23   Vannie Reche RAMAN, NP  carvedilol  (COREG ) 25 MG tablet Take 1 tablet (25 mg total)  by mouth 2 (two) times daily with a meal. 11/28/23   Walker, Caitlin S, NP  clotrimazole -betamethasone  (LOTRISONE ) cream Apply 1 Application topically 2 (two) times daily. 10/30/23   Fausto Burnard LABOR, DO  Fe Fum-Vit C-Vit B12-FA (TRIGELS-F FORTE) CAPS capsule Take 1 capsule by mouth daily after breakfast. 10/31/23   Fausto Burnard LABOR, DO  hydrALAZINE  (APRESOLINE ) 100 MG tablet Take 1 tablet (100 mg total) by mouth every 8 (eight) hours. 11/28/23   Vannie Reche RAMAN, NP  irbesartan  (AVAPRO ) 150 MG tablet Take 1 tablet (150 mg total) by mouth daily. 12/04/23   Lonni Slain, MD  spironolactone  (ALDACTONE ) 25 MG tablet Take 1 tablet (25 mg  total) by mouth 2 (two) times daily. 12/01/23   Vannie Reche RAMAN, NP  Vitamin D , Ergocalciferol , (DRISDOL ) 1.25 MG (50000 UNIT) CAPS capsule Take 1 capsule (50,000 Units total) by mouth every 7 (seven) days. 11/03/23   Fausto Burnard LABOR, DO                                                                                 Vitals:   02/08/24 1200 02/08/24 1215 02/08/24 1245 02/08/24 1513  BP: (!) 191/107 (!) 203/107 (!) 130/108   Pulse: 81 78 78   Resp: (!) 22 17 (!) 21   Temp:    98.3 F (36.8 C)  TempSrc:    Oral  SpO2: 98% 99% 100%   Weight:      Height:       Physical Exam Vitals reviewed: exam is ltd due to his morbid obesity.  Constitutional:      General: He is not in acute distress.    Appearance: He is morbidly obese. He is not ill-appearing.  HENT:     Head: Atraumatic.  Eyes:     Extraocular Movements: Extraocular movements intact.  Cardiovascular:     Rate and Rhythm: Normal rate and regular rhythm.     Pulses: Normal pulses.     Heart sounds: Normal heart sounds.  Pulmonary:     Effort: Pulmonary effort is normal.     Comments: Basilar crackles.  Abdominal:     General: Bowel sounds are normal. There is no distension.     Palpations: Abdomen is soft.     Tenderness: There is no abdominal tenderness. There is no guarding.  Musculoskeletal:        General: Swelling present.     Right lower leg: Edema present.     Left lower leg: Edema present.  Neurological:     General: No focal deficit present.     Mental Status: He is oriented to person, place, and time and easily aroused.     Motor: No weakness.  Psychiatric:        Behavior: Behavior is cooperative.     Labs on Admission: I have personally reviewed following labs and imaging studies CBC: Recent Labs  Lab 02/08/24 0953 02/08/24 1410  WBC 9.0  --   HGB 10.5* 12.2*  HCT 36.2* 36.0*  MCV 80.1  --   PLT 251  --    Basic Metabolic Panel: Recent Labs  Lab 02/08/24 0953 02/08/24 1410  NA 137 141  K 3.6 3.5  CL 99  --   CO2 31  --   GLUCOSE 98  --   BUN 13  --   CREATININE 1.39*  --   CALCIUM 8.2*  --    GFR: Estimated Creatinine Clearance (by C-G formula based on SCr of 1.39 mg/dL (H)) Male: 869.4 mL/min (A) Male: 156.3 mL/min (A) Liver Function Tests: Recent Labs  Lab 02/08/24 0953  AST 20  ALT 11  ALKPHOS 78  BILITOT 0.4  PROT 7.2  ALBUMIN 3.4*   No results for input(s): LIPASE, AMYLASE in the last 168 hours. No results for input(s): AMMONIA in the last 168 hours. Recent Labs    09/04/23 0342 09/11/23 1422 10/24/23 2112 10/25/23 2021 10/26/23 0916 10/27/23 0435 10/28/23 0625 10/29/23 0442 10/30/23 0428 02/08/24 0953  BUN 14 15 19 16 19  26* 37* 38* 27* 13  CREATININE 1.48* 1.36* 1.40* 1.36* 1.73* 1.92* 2.00* 1.61* 1.16 1.39*   Cardiac Enzymes: No results for input(s): CKTOTAL, CKMB, CKMBINDEX, TROPONINI in the last 168 hours. BNP (last 3 results) Recent Labs    02/08/24 0953  PROBNP 239.0   HbA1C: No results for input(s): HGBA1C in the last 72 hours. CBG: No results for input(s): GLUCAP in the last 168 hours. Lipid Profile: No results for input(s): CHOL, HDL, LDLCALC, TRIG, CHOLHDL, LDLDIRECT in the last 72 hours. Thyroid  Function Tests: No results for input(s): TSH, T4TOTAL, FREET4, T3FREE, THYROIDAB in the last 72 hours. Anemia Panel: No results for input(s): VITAMINB12, FOLATE, FERRITIN, TIBC, IRON, RETICCTPCT in the last 72 hours. Urine analysis:    Component Value Date/Time   COLORURINE YELLOW (A) 10/25/2023 2200   APPEARANCEUR HAZY (A) 10/25/2023 2200   LABSPEC 1.021 10/25/2023 2200   PHURINE 7.0 10/25/2023 2200   GLUCOSEU NEGATIVE 10/25/2023 2200   HGBUR NEGATIVE 10/25/2023 2200   BILIRUBINUR NEGATIVE 10/25/2023 2200   KETONESUR NEGATIVE 10/25/2023 2200   PROTEINUR 100 (A) 10/25/2023 2200   UROBILINOGEN 1.0 10/15/2008 1535   NITRITE NEGATIVE 10/25/2023 2200   LEUKOCYTESUR  NEGATIVE 10/25/2023 2200   Radiological Exams on Admission: DG Chest Portable 1 View Result Date: 02/08/2024 CLINICAL DATA:  Chest tightness and shortness of breath. EXAM: PORTABLE CHEST 1 VIEW COMPARISON:  October 25, 2023 FINDINGS: The cardiac silhouette is mildly enlarged which may be, in part, technical in origin. There is moderate severity prominence of the perihilar and hilar pulmonary vasculature. This is increased in severity when compared to the prior study. No acute infiltrate, pleural effusion or pneumothorax is identified. The visualized skeletal structures are unremarkable. IMPRESSION: Mild cardiomegaly with moderate severity pulmonary vascular congestion. Electronically Signed   By: Suzen Dials M.D.   On: 02/08/2024 11:31   Data Reviewed: Relevant notes from primary care and specialist visits, past discharge summaries as available in EHR, including Care Everywhere . Prior diagnostic testing as pertinent to current admission diagnoses, Updated medications and problem lists for reconciliation .ED course, including vitals, labs, imaging, treatment and response to treatment,Triage notes, nursing and pharmacy notes and ED provider's notes.Notable results as noted in HPI.Discussed case with EDMD/ ED APP/ or Specialty MD on call and as needed.  Assessment & Plan  Morbidly obese admitted for SOB.  >> Shortness of breath: Morbidly obese patient presenting with shortness of breath and new oxygen requirement.  Differentials include new onset CHF pulmonary hypertension coronary ischemia initial troponin is negative, pulmonary infectious etiology respiratory viral panel is pending, will rule out pulmonary embolism and obtain CTA also evaluate his underlying aortic  dilatation.  Clinically patient has lower extremity edema crackles on exam, probe and PE is within normal limits however it is inconclusive due to patient's morbid obesity, chest x-ray shows mild cardiomegaly with moderate to severe  pulmonary vascular congestion.  We will evaluate as follows: Suspected heart failure with preserved ejection fraction/diastolic dysfunction.  Morbidly obese patient with an abnormal echo with notable for left atrial dilatation indeterminate diastolic parameters limited to body habitus and clinically he is concerning for heart failure left atrial enlargement is strongly suggestive of chronically elevated LV pressures supporting his HFpEF unfortunately his contributing factors known sleep apnea and morbid obesity longstanding hypertension also put him at high risk for same.  Pt is also cocaine abuser which put him at high risk for CHF and CAD. Will continue patient on continuous pulse oximetry gentle diuresis overnight strict I's and O's.  Sodium volume blood pressure control with GDMT meds as tolerated with close monitoring to prevent hypotension.In ED pt got lasix  20 mg x 1. We will continue same x 2 more doses from next dose at 20:00.  >>Acute hypoxic/ hypercapnic respiratory failure: Bipap per RT consult. Continous or as needed.    >> Essential hypertension: Vitals:   02/08/24 0906 02/08/24 1025 02/08/24 1200 02/08/24 1215  BP: (!) 216/114 (!) 192/109 (!) 191/107 (!) 203/107   02/08/24 1245  BP: (!) 130/108  PTA meds include amlodipine  10 Coreg  25 hydralazine  100 Avapro  150 and Aldactone  25.  We will continue patient on Coreg , and hydralazine .  Patient's Aldactone  and Avapro  until AKI improves. Also allowing room for diuresis.   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   >> Morbid obesity with a BMI of 76.54: Nutrition counseling evaluation for underlying diabetes previous A1c is 5.5 on mounjaro, so unclear if he is well controlled diabetic or prediabetic, will defer to PCP for aggressive measures and counseling for weight loss in addition to outpatient resources available for the  patient. Last Weight  Most recent update: 02/08/2024  9:01 AM    Weight  256 kg (564 lb 6 oz)               >> OSA/obesity hypoventilation syndrome: No ABG today however patient does have ABG in February which shows elevated PaCO2 with a bicarb of 31.1 indicative of OSA contributing to hypoventilation and chronic CO2 retention most likely. Based on chart review patient had a sleep study in May 2 does not 25 showing:  This is also a  contributor to his HFpEF, left atrial dilatation, suspected underlying pulmonary hypertension which can perpetuate diastolic dysfunction and recurrent decompensation.  CPAP per home settings.    >> Paroxysmal A-fib: Currently in sinus rhythm EKG today shows: EKG shows sinus rhythm at 77 PR 142 QTc of 479 no ST-T wave changes. Patient's most recent echocardiogram from September 2025 shows: 1. Left ventricular ejection fraction, by estimation, is 65 to 70%. The  left ventricle has normal function. The left ventricle has no regional  wall motion abnormalities. The left ventricular internal cavity size was  mildly dilated. There is moderate  left ventricular hypertrophy. Left ventricular diastolic parameters are  indeterminate.   2. Right ventricular systolic function is normal. The right ventricular  size is normal.   3. Left atrial size was moderately dilated.   4. Right atrial size was mildly dilated.   5. The  mitral valve is normal in structure. No evidence of mitral valve  regurgitation. No evidence of mitral stenosis.   6. The aortic valve is normal in structure. Aortic valve regurgitation is  not visualized. No aortic stenosis is present.   7. Aortic dilatation noted. There is mild dilatation of the aortic root,  measuring 40 mm.   8. The inferior vena cava is dilated in size with >50% respiratory  variability, suggesting right atrial pressure of 8 mmHg.   9. No clear vegetations but most valves were not well visualized. Poor  image quality.     >>Lymphedema: Patient has severe chronic lymphedema with advanced skin changes secondary to his long standing extensive lymphedema, high risk for skin breakdown and recurrent infections and impaired mobility. Manual lymphatic drainage compressive wraps Velcro compressive devices referral for lymphedema therapy consult. Daily compression wrapping avoiding prolonged dependency. Skin care and wound care prevention.  Infection surveillance.  Physical therapy for mobility assistance.  Chart review shows patient has had a CT of the abdomen and pelvis on October 30, 2023 which was abnormal showing: Multiple enlarged bilateral inguinal, bilateral pelvic sidewall, and retroperitoneal lymph nodes. Finding concerning for lymphoproliferative disorder versus less likely metastases. Recommend further evaluation with CT chest with intravenous contrast to evaluate for lymph nodes.No acute intra-abdominal, intrapelvic traumatic injury.  We will again follow up with cta ordered today and re-image abd/ pelvis as deemed appropriate.  With his anemia I agree he needs to have eval for same.   >>AKI: Lab Results  Component Value Date   CREATININE 1.39 (H) 02/08/2024   CREATININE 1.16 10/30/2023   CREATININE 1.61 (H) 10/29/2023  Cautious diuresis and contrast administration. Although in his case his egfr should be above level to get ct chest angio PE protocol.   >>Anemia: Cbc trend review shows anemia since 2016 seems he is chronic suspect combination of IDA and or ACD. Anemia panel, type/cross, iv ppi.    DVT prophylaxis:  Heparin .  Consults:  None.  Advance Care Planning:    Code Status: Full Code   Family Communication:  None.  Disposition Plan:  Home.  Severity of Illness: The appropriate patient status for this patient is INPATIENT. Inpatient status is judged to be reasonable and necessary in order to provide the required intensity of service to ensure the patient's safety. The patient's  presenting symptoms, physical exam findings, and initial radiographic and laboratory data in the context of their chronic comorbidities is felt to place them at high risk for further clinical deterioration. Furthermore, it is not anticipated that the patient will be medically stable for discharge from the hospital within 2 midnights of admission.   * I certify that at the point of admission it is my clinical judgment that the patient will require inpatient hospital care spanning beyond 2 midnights from the point of admission due to high intensity of service, high risk for further deterioration and high frequency of surveillance required.*  Unresulted Labs (From admission, onward)     Start     Ordered   02/09/24 0500  Comprehensive metabolic panel  Tomorrow morning,   R        02/08/24 1319   02/09/24 0500  CBC  Tomorrow morning,   R        02/08/24 1319   02/09/24 0500  Magnesium   Tomorrow morning,   R        02/08/24 1402   02/08/24 1314  Hemoglobin A1c  Add-on,   AD  02/08/24 1313   02/08/24 1313  Ethanol  Add-on,   AD        02/08/24 1312   02/08/24 1245  Magnesium   Add-on,   AD        02/08/24 1244   02/08/24 0921  Resp panel by RT-PCR (RSV, Flu A&B, Covid) Anterior Nasal Swab  Once,   URGENT        02/08/24 0920            Meds ordered this encounter  Medications   labetalol  (NORMODYNE ) injection 10 mg   furosemide  (LASIX ) injection 20 mg q12h/ x 2 doses.   thiamine  (VITAMIN B1) injection 100 mg   sodium chloride  flush (NS) 0.9 % injection 3 mL   OR Linked Order Group    acetaminophen  (TYLENOL ) tablet 650 mg    acetaminophen  (TYLENOL ) suppository 650 mg   docusate sodium  (COLACE) capsule 100 mg   hydrALAZINE  (APRESOLINE ) injection 5 mg   heparin  injection 5,000 Units   oxyCODONE  (Oxy IR/ROXICODONE ) immediate release tablet 5 mg    Refill:  0   carvedilol  (COREG ) tablet 25 mg   hydrALAZINE  (APRESOLINE ) tablet 100 mg   furosemide  (LASIX ) injection 20 mg    iohexol  (OMNIPAQUE ) 350 MG/ML injection 110 mL     Orders Placed This Encounter  Procedures   Resp panel by RT-PCR (RSV, Flu A&B, Covid) Anterior Nasal Swab   DG Chest Portable 1 View   CT Angio Chest PE W and/or Wo Contrast   Basic metabolic panel   CBC   Hepatic function panel   Pro Brain natriuretic peptide   Magnesium    Urine Drug Screen   Ethanol   Hemoglobin A1c   Comprehensive metabolic panel   CBC   Magnesium    Diet 2 gram sodium Room service appropriate? Yes; Fluid consistency: Thin   Document Height and Actual Weight   If O2 Sat <94% administer O2 at 2 liters/minute via nasal cannula   Notify physician (specify)   Initiate Heart Failure Care Plan   Daily weights   Strict intake and output   In and Out Cath   Patient Education:   Apply Heart Failure Care Plan   [facility] and AP only) Obtain REDS clips reading Every morning   Apply Diabetes Mellitus Care Plan   STAT CBG when hypoglycemia is suspected. If treated, recheck every 15 minutes after each treatment until CBG >/= 70 mg/dl   Refer to Hypoglycemia Protocol Sidebar Report for treatment of CBG < 70 mg/dl   Cardiac Monitoring Continuous x 24 hours Indications for use: Sub-acute heart failure   Maintain IV access   Vital signs   Notify physician (specify)   Mobility Protocol: No Restrictions   Refer to Sidebar Report Mobility Protocol for Adult Inpatient   Initiate Adult Central Line Maintenance and Catheter Clearance Protocol for patients with central line (CVC, PICC, Port, Hemodialysis, Trialysis)   If patient diabetic or glucose greater than 140 notify physician for Sliding Scale Insulin  Orders   Initiate CHG Protocol for patients in ICU/SD or any patient with a central line or foley catheter   Do not place and if present remove PureWick   Initiate Oral Care Protocol   Initiate Carrier Fluid Protocol   RN may order General Admission PRN Orders utilizing General Admission PRN medications (through manage orders)  for the following patient needs: allergy symptoms (Claritin), cold sores (Carmex), cough (Robitussin DM), eye irritation (Liquifilm Tears), hemorrhoids (Tucks), indigestion (Maalox), minor skin irritation (Hydrocortisone Cream), muscle  pain Lucienne Siad), nose irritation (saline nasal spray) and sore throat (Chloraseptic spray).   Ambulate with assistance   Ambulate patient   Elevate extremity   Neuro checks   Full code   Consult for Unassigned Medical Admission   Consult to Transition of Care Team   Consult to Heart Failure Navigation Team [facility], [place], and [facility])   Nutritional services consult   Consult to Registered Dietitian   OT eval and treat   PT eval and treat   Pulse oximetry check with vital signs   Oxygen therapy Mode or (Route): Nasal cannula; Liters Per Minute: 2; Keep O2 saturation between: greater than 92 %   Incentive spirometry   CPAP   Bipap   I-Stat arterial blood gas, ED ([facility], [facility], [facility])   I-Stat venous blood gas, ([facility] ED, [facility], [facility])   ED EKG   EKG 12-Lead   Insert peripheral IV   Admit to Inpatient (patient's expected length of stay will be greater than 2 midnights or inpatient only procedure)   Skin care precautions    Author: Mario LULLA Blanch, MD 12 pm- 8 pm. [facility]. 02/08/2024 4:25 PM Please note for any communication after hours contact [facility] Assigned provider on call on Amion.       [1]  Allergies Allergen Reactions   Crab (Diagnostic) Diarrhea and Nausea And Vomiting    Reaction only to crab legs    "

## 2024-02-09 ENCOUNTER — Institutional Professional Consult (permissible substitution): Admitting: Nurse Practitioner

## 2024-02-09 DIAGNOSIS — G4733 Obstructive sleep apnea (adult) (pediatric): Secondary | ICD-10-CM

## 2024-02-09 DIAGNOSIS — R051 Acute cough: Secondary | ICD-10-CM | POA: Diagnosis not present

## 2024-02-09 DIAGNOSIS — R600 Localized edema: Secondary | ICD-10-CM | POA: Diagnosis not present

## 2024-02-09 DIAGNOSIS — E66813 Obesity, class 3: Secondary | ICD-10-CM

## 2024-02-09 DIAGNOSIS — J188 Other pneumonia, unspecified organism: Secondary | ICD-10-CM

## 2024-02-09 DIAGNOSIS — I160 Hypertensive urgency: Secondary | ICD-10-CM | POA: Diagnosis not present

## 2024-02-09 DIAGNOSIS — J9602 Acute respiratory failure with hypercapnia: Secondary | ICD-10-CM

## 2024-02-09 DIAGNOSIS — J9601 Acute respiratory failure with hypoxia: Secondary | ICD-10-CM | POA: Diagnosis not present

## 2024-02-09 DIAGNOSIS — I10 Essential (primary) hypertension: Secondary | ICD-10-CM | POA: Diagnosis not present

## 2024-02-09 LAB — COMPREHENSIVE METABOLIC PANEL WITH GFR
ALT: 11 U/L (ref 0–44)
AST: 17 U/L (ref 15–41)
Albumin: 3.4 g/dL — ABNORMAL LOW (ref 3.5–5.0)
Alkaline Phosphatase: 76 U/L (ref 38–126)
Anion gap: 8 (ref 5–15)
BUN: 17 mg/dL (ref 6–20)
CO2: 32 mmol/L (ref 22–32)
Calcium: 8.3 mg/dL — ABNORMAL LOW (ref 8.9–10.3)
Chloride: 98 mmol/L (ref 98–111)
Creatinine, Ser: 1.53 mg/dL — ABNORMAL HIGH (ref 0.61–1.24)
GFR, Estimated: >60 mL/min
Glucose, Bld: 88 mg/dL (ref 70–99)
Potassium: 3.7 mmol/L (ref 3.5–5.1)
Sodium: 138 mmol/L (ref 135–145)
Total Bilirubin: 0.5 mg/dL (ref 0.0–1.2)
Total Protein: 7.4 g/dL (ref 6.5–8.1)

## 2024-02-09 LAB — GLUCOSE, CAPILLARY
Glucose-Capillary: 105 mg/dL — ABNORMAL HIGH (ref 70–99)
Glucose-Capillary: 120 mg/dL — ABNORMAL HIGH (ref 70–99)
Glucose-Capillary: 124 mg/dL — ABNORMAL HIGH (ref 70–99)

## 2024-02-09 LAB — CBC
HCT: 35.7 % — ABNORMAL LOW (ref 39.0–52.0)
Hemoglobin: 10.3 g/dL — ABNORMAL LOW (ref 13.0–17.0)
MCH: 23 pg — ABNORMAL LOW (ref 26.0–34.0)
MCHC: 28.9 g/dL — ABNORMAL LOW (ref 30.0–36.0)
MCV: 79.7 fL — ABNORMAL LOW (ref 80.0–100.0)
Platelets: 292 K/uL (ref 150–400)
RBC: 4.48 MIL/uL (ref 4.22–5.81)
RDW: 19.2 % — ABNORMAL HIGH (ref 11.5–15.5)
WBC: 9.2 K/uL (ref 4.0–10.5)
nRBC: 0 % (ref 0.0–0.2)

## 2024-02-09 LAB — MISC LABCORP TEST (SEND OUT): Labcorp test code: 140140

## 2024-02-09 LAB — CBG MONITORING, ED: Glucose-Capillary: 92 mg/dL (ref 70–99)

## 2024-02-09 LAB — MAGNESIUM: Magnesium: 2 mg/dL (ref 1.7–2.4)

## 2024-02-09 LAB — MRSA NEXT GEN BY PCR, NASAL: MRSA by PCR Next Gen: NOT DETECTED

## 2024-02-09 MED ORDER — THIAMINE MONONITRATE 100 MG PO TABS
100.0000 mg | ORAL_TABLET | Freq: Every day | ORAL | Status: DC
  Administered 2024-02-10 – 2024-02-15 (×6): 100 mg via ORAL
  Filled 2024-02-09 (×6): qty 1

## 2024-02-09 MED ORDER — NICOTINE 21 MG/24HR TD PT24
21.0000 mg | MEDICATED_PATCH | Freq: Every day | TRANSDERMAL | Status: DC
  Filled 2024-02-09 (×7): qty 1

## 2024-02-09 NOTE — ED Notes (Signed)
 Pt removed BiPAP and refused to put it back on. Pt educated on the importance to wearing BiPAP for the plan of care.

## 2024-02-09 NOTE — Progress Notes (Signed)
 "        [facility]                                                                               Jared Mccoy, is a 33 y.o. adult, DOB - 09/21/1990, FMW:[id number] Admit date - 02/08/2024    Outpatient Primary MD for the patient is Celestia Rosaline SQUIBB, NP  LOS - 1  days    Brief summary   Jared Mccoy is a 33 y.o. adult with past medical history  of  Morbid obesity with OSA, paroxysmal A-fib not on AC, HTN, chronic lower extremity edema  from lymphedema and  coming for sob and hypoxia.  CT angio showing  Multifocal pneumonia of the right lower and upper lobes. Recommend follow-up CT in 3 months to evaluate for complete resolution. Right hilar lymphadenopathy that may be reactive in etiology - recommend attention on follow-up. Enlarged main pulmonary artery - correlate for pulmonary hypertension.   Assessment & Plan    Assessment and Plan:  Acute hypoxic and hypercapnic respiratory failure secondary to multifocal pneumonia in the setting of obstructive sleep apnea and obesity hypoventilation syndrome. Patient was started on broad-spectrum IV antibiotics, 4 L of nasal cannula oxygen to keep sats greater than 90%. BiPAP as needed. Respiratory panel is pending Follow-up blood cultures. Check urine for streptococcal and Legionella antigen CT angiogram did not show any acute PE at this time   Uncontrolled hypertension Restart home medications Blood pressure parameters have improved this morning. Continue with Coreg  25 mg twice daily, hydralazine  100 mg every 8 hours,.   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   History of obstructive sleep apnea/obesity hypoventilation syndrome Pulmonary hypertension Continue with CPAP at night   Paroxysmal atrial fibrillation Rate controlled, not on any anticoagulation at this time.    Chronic lymphedema with advanced skin changes secondary to longstanding extensive  lymphedema and impaired mobility Recommend compressive wraps Outpatient lymphedema clinic referral Therapy evaluations for mobility assistance   Right hilar lymphadenopathy on CT angiogram this admission And CT of the abdomen and pelvis in September 2025 showing extensive lymphadenopathy. Findings concerning for lymphoproliferative disorder Recommend outpatient follow-up with a repeat CT abd and pelvis.    Cocaine abuse:  UDS positive for cocaine TOC for resources    Normocytic anemia Anemia panel showing iron deficiency. Replace iron with supplementation   Morbid obesity Recommend outpatient follow-up with bariatric surgery.    Estimated body mass index is 76.54 kg/m as calculated from the following:   Height as of this encounter: 6' (1.829 m).   Weight as of this encounter: 256 kg.  Code Status: Full code DVT Prophylaxis:  heparin  injection 5,000 Units Start: 02/08/24 1400   Level of Care: Level of care: Progressive Family Communication: None at bedside  Disposition Plan:     Remains inpatient appropriate: Pending clinical improvement  Procedures:  CT angiogram of the chest Consultants:   None  Antimicrobials:   Anti-infectives (From admission, onward)    Start     Dose/Rate Route Frequency Ordered Stop   02/09/24 0600  piperacillin -tazobactam (ZOSYN ) IVPB 3.375 g       Placed  in Followed by Linked Group   3.375 g 12.5 mL/hr over 240 Minutes Intravenous Every 8 hours 02/08/24 1915     02/08/24 1930  piperacillin -tazobactam (ZOSYN ) IVPB 3.375 g       Placed in Followed by Linked Group   3.375 g 100 mL/hr over 30 Minutes Intravenous  Once 02/08/24 1915 02/08/24 2028        Medications  Scheduled Meds:  carvedilol   25 mg Oral BID WC   furosemide   20 mg Intravenous Q12H   heparin   5,000 Units Subcutaneous Q8H   hydrALAZINE   100 mg Oral Q8H   sodium chloride  flush  3 mL Intravenous Q12H   thiamine  (VITAMIN B1) injection  100 mg Intravenous  Q24H   Continuous Infusions:  piperacillin -tazobactam (ZOSYN )  IV 3.375 g (02/09/24 0632)   PRN Meds:.acetaminophen  **OR** acetaminophen , docusate sodium , hydrALAZINE , oxyCODONE     Subjective:   Jared Mccoy was seen and examined today.  Reports shortness of breath. As per the RN patient took off his oxygen to eat and his oxygen sats have dropped to 80s.  Currently patient is on 4 L of nasal cannula oxygen.  Patient currently denies any chest pain, nausea or vomiting.  Objective:   Vitals:   02/09/24 0347 02/09/24 0553 02/09/24 0630 02/09/24 0831  BP:  (!) 143/80 (!) 140/75 (!) 149/81  Pulse:  88 85 77  Resp:  20 (!) 30 (!) 23  Temp: 98.1 F (36.7 C)   98.8 F (37.1 C)  TempSrc: Oral   Axillary  SpO2:  100% 100% 100%  Weight:      Height:        Intake/Output Summary (Last 24 hours) at 02/09/2024 1118 Last data filed at 02/08/2024 2028 Gross per 24 hour  Intake 50 ml  Output 600 ml  Net -550 ml   Filed Weights   02/08/24 0900  Weight: (!) 256 kg     Exam General exam: Morbidly obese young gentleman not in any kind of distress Respiratory system: Diminished air entry, no wheezing heard Cardiovascular system: S1 & S2 heard, RRR. Gastrointestinal system: Abdomen is soft BS + Central nervous system: Alert and oriented.  Grossly nonfocal Extremities: Bilateral chronic lymphedema on the lower extremities with advanced skin changes Skin: Chronic lymphedema with advanced skin changes on the lower extremities Psychiatry: Patient anxious   Data Reviewed:  I have personally reviewed following labs and imaging studies   CBC Lab Results  Component Value Date   WBC 9.0 02/08/2024   RBC 4.80 02/08/2024   HGB 12.2 (L) 02/08/2024   HCT 36.0 (L) 02/08/2024   MCV 80.1 02/08/2024   MCH 23.2 (L) 02/08/2024   PLT 251 02/08/2024   MCHC 29.0 (L) 02/08/2024   RDW 19.1 (H) 02/08/2024   LYMPHSABS 0.7 10/25/2023   MONOABS 0.6 10/25/2023   EOSABS 0.1 10/25/2023   BASOSABS  0.0 10/25/2023     Last metabolic panel Lab Results  Component Value Date   NA 140 02/08/2024   K 3.7 02/08/2024   CL 99 02/08/2024   CO2 31 02/08/2024   BUN 13 02/08/2024   CREATININE 1.39 (H) 02/08/2024   GLUCOSE 98 02/08/2024   GFRNONAA >60 02/08/2024   GFRAA >60 08/12/2019   CALCIUM 8.2 (L) 02/08/2024   PHOS 3.3 10/29/2023   PROT 7.2 02/08/2024   ALBUMIN 3.4 (L) 02/08/2024   LABGLOB 3.3 09/11/2023   BILITOT 0.4 02/08/2024   ALKPHOS 78 02/08/2024   AST 20 02/08/2024   ALT 11 02/08/2024  ANIONGAP 7 02/08/2024    CBG (last 3)  Recent Labs    02/08/24 2205 02/09/24 0828  GLUCAP 100* 92      Coagulation Profile: No results for input(s): INR, PROTIME in the last 168 hours.   Radiology Studies: CT Angio Chest PE W and/or Wo Contrast Result Date: 02/08/2024 EXAM: CTA CHEST 02/08/2024 02:47:02 PM TECHNIQUE: CTA of the chest was performed without and with the administration of 110 mL of iohexol  (OMNIPAQUE ) 350 MG/ML injection. Multiplanar reformatted images are provided for review. MIP images are provided for review. Automated exposure control, iterative reconstruction, and/or weight based adjustment of the mA/kV was utilized to reduce the radiation dose to as low as reasonably achievable. COMPARISON: None available. CLINICAL HISTORY: Pulmonary embolism (PE) suspected, high prob; sob. FINDINGS: PULMONARY ARTERIES: Pulmonary arteries are adequately opacified for evaluation. No central or segmental pulmonary embolus. Limited evaluation of the subsegmental level due to timing of contrast. The main pulmonary artery is enlarged, measuring up to 3.6 cm. MEDIASTINUM: The heart and pericardium demonstrate no acute abnormality. The thoracic aorta is normal in caliber. LYMPH NODES: Enlarged right hilar lymph node measuring up to 1.7 cm. No mediastinal or axillary lymphadenopathy. LUNGS AND PLEURA: Right lower lobe or bronchovascular and basilar hypodense consolidation. Patchy right  upper lobe and right lower lobe airspace opacities. No evidence of pleural effusion or pneumothorax. UPPER ABDOMEN: Limited images of the upper abdomen are unremarkable. SOFT TISSUES AND BONES: No acute bone or soft tissue abnormality. IMPRESSION: 1. No central or segmental pulmonary embolus. Limited evaluation at the subsegmental level due to contrast timing. 2. Multifocal pneumonia of the right lower and upper lobes. Recommend follow-up CT in 3 months to evaluate for complete resolution. 3. Right hilar lymphadenopathy that may be reactive in etiology - recommend attention on follow-up. 4. Enlarged main pulmonary artery - correlate for pulmonary hypertension. Electronically signed by: Morgane Naveau MD 02/08/2024 04:37 PM EST RP Workstation: [id number]   DG Chest Portable 1 View Result Date: 02/08/2024 CLINICAL DATA:  Chest tightness and shortness of breath. EXAM: PORTABLE CHEST 1 VIEW COMPARISON:  October 25, 2023 FINDINGS: The cardiac silhouette is mildly enlarged which may be, in part, technical in origin. There is moderate severity prominence of the perihilar and hilar pulmonary vasculature. This is increased in severity when compared to the prior study. No acute infiltrate, pleural effusion or pneumothorax is identified. The visualized skeletal structures are unremarkable. IMPRESSION: Mild cardiomegaly with moderate severity pulmonary vascular congestion. Electronically Signed   By: Suzen Dials M.D.   On: 02/08/2024 11:31       Elgie Butter M.D. [facility] 02/09/2024, 11:18 AM  Available via Epic secure chat 7am-7pm After 7 pm, please refer to night coverage provider listed on amion.    "

## 2024-02-09 NOTE — Discharge Instructions (Signed)
 "                                    Outpatient Substance Abuse  Treatment- uninsured  Narcotics Anonymous 24-HOUR HELPLINE Pre-recorded for Meeting Schedules [facility] [phone]  [email or web address] ALCOHOLICS ANONYMOUS  [place]   Answering Service [phone] Please Note: All [place] Meetings are Non-smoking [email or web address]  [facility] -  Insurance: Medicaid /State funding/private insurance Methadone, suboxone/Intensive outpatient     [phone] Fax: [phone] [address] [place], [place], [address] [place] [phone] Fax: [phone]    [address] [place], [place], [address] ([address] [place], [place], [place], [place], [place], [place], [place], [place]) [facility] [email or web address] Accepts State funding/Medicaid Transitional housing, Intensive Outpatient Treatment, Outpatient treatment, Veterans Services  Phone: [phone] Fax: [phone] Address: [address], [place] [address]   [facility] ([email or web address] Insurance: Medicaid Case Management, Administrator, Arts, Medication Management, Outpatient Therapy, Psychosocial Rehabilitation, Substance Abuse Intensive Outpatient  Phone: [phone] Fax: [phone] [address], [place], [place], [address]  [facility] Medicaid, most private insurance providers Types of Program: Individual/Group Therapy, Substance Abuse Treatment  Phone: [place] [phone] Fax: [phone] [address], [place], [place], [address] [place] [phone] [address] [place], [place], [address]  [facility]  Medicaid Types of Program: SAIOP  Phone: [phone] Fax: [phone] [address] [place], [place], [place], [address] [facility] Medicaid/state funds Crisis line [phone] [facility] [phone] [place] [phone]  [place] [phone]  [facility] [address];  [place], [place] [address] [phone]  Substance Abuse Intensive Outpatient Program [facility] [address] [place], [place] [address] [phone] Substance abuse treatment  Successful Transitions  Insurance: [facility] [facility], [address], sliding scale Types of Program: substance abuse treatment, transportation assistance Phone: [phone] Fax: [phone] Address: [address], [place] [address] [facility] ([email or web address]) Insurance: UHC, [place], [place], [place] Program: addiction counseling, detoxification,  Phone: [phone]  Fax: [phone] Address: [address] [place], [place] [address]  [facility] (statewide facilities/programs) [address] (Medicaid/state funds) [place], [place] [address]                      [email or web address] [phone] [place]cal[place]- [phone] [place]- [phone] [facility] (2 Locations) (Medicaid/state funds) --[address]  walk in 8:30-12 and 1-2:30 [place], [id number]   [facility]- [phone] --[address] [place], [place] [address]  [phone] walk in 8:30-12 and 2-3:30  [facility] state funds/medicaid [address] [place], [place] [address] [id number] [facility] (Suboxone clinic) Medicaid/state funds  [address]  [place], [place] [address] [phone]   [facility]  [address], [place], [place] [address]  [phone] (24 hours) [place]- [address] [place], [place] [address]  [phone] (24 hours) [place]- [address] [place] [phone] [place]- [address] [place] [phone] [place] [address] [place] [phone] [facility]- Medicaid and state funds  [place]- [address] [place], [place] [address] [phone] (24 hours) [place]- [address] [place],  [place] [address] [phone] [facility]- [address] [place], [place] [address] [phone] (24 hours) [place]  [address] [place], [place]  [address] [phone] [place]- [address] [place] [phone]      Low Sodium Nutrition Therapy  Eating less sodium can help you if you have high blood pressure, heart failure, or kidney or liver disease.   Your body needs a little sodium, but too much sodium can cause your body to hold onto extra water. This extra water will raise your blood pressure and can cause damage to your heart, kidneys, or liver as they are forced to work harder.   Sometimes you can see how the extra fluid affects you because your hands, legs, or belly swell. You may also hold water around your heart and lungs, which makes it hard to breathe.   Even if you take medication for blood pressure or a water pill (diuretic) to remove fluid, it is still important to have less salt in your diet.   Check with your primary care provider before drinking alcohol since it may affect the amount of fluid in your body and how your heart, kidneys, or liver work. Sodium in Food A low-sodium meal plan limits the sodium that you get from food and beverages to 1,500-2,000 milligrams (mg) per day. Salt is the main source of sodium. Read the nutrition label on the package to find out how much sodium is in one serving of a food.  Select foods with 140 milligrams (mg) of sodium or less per serving.  You may be able to eat one or two servings of foods with a little more than 140 milligrams (mg) of sodium if you are closely watching how much sodium you eat in a day.  Check the serving size on the label. The amount of sodium listed on the label shows the amount in one serving of the food. So, if you eat more than one serving, you will get more sodium than the amount listed.  Tips Cutting Back on Sodium Eat more fresh foods.  Fresh fruits and vegetables are low in sodium, as well as frozen  vegetables and fruits that have no added juices or sauces.  Fresh meats are lower in sodium than processed meats, such as bacon, sausage, and hotdogs.  Not all processed foods are unhealthy, but some processed foods may have too much sodium.  Eat less salt at the table and when cooking. One of the ingredients in salt is sodium.  One teaspoon of table salt has 2,300 milligrams of sodium.  Leave the salt out of recipes for pasta, casseroles, and soups. Be a engineer, building services.  Food packages that say Salt-free, sodium-free, very low sodium, and low sodium have less than 140 milligrams of sodium per serving.  Beware of products identified as Unsalted, No Salt Added, Reduced Sodium, or Lower Sodium. These items may still be high in sodium. You should always check the nutrition label. Add flavors to your food without adding sodium.  Try lemon juice, lime juice, or vinegar.  Dry or fresh herbs add flavor.  Buy a sodium-free seasoning blend or make your own at home. You can purchase salt-free or sodium-free condiments like barbeque sauce in stores and online. Ask your registered dietitian nutritionist for recommendations and where to find them.   Eating in Restaurants Choose foods carefully when you eat outside your home. Restaurant foods can be very high in sodium. Many restaurants provide nutrition facts on their menus or their websites. If you cannot find that information, ask your server. Let your server know that you want your food  to be cooked without salt and that you would like your salad dressing and sauces to be served on the side.    Foods Recommended Food Group Foods Recommended  Grains Bread, bagels, rolls without salted tops Homemade bread made with reduced-sodium baking powder Cold cereals, especially shredded wheat and puffed rice Oats, grits, or cream of wheat Pastas, quinoa, and rice Popcorn, pretzels or crackers without salt Corn tortillas  Protein Foods Fresh meats  and fish; turkey bacon (check the nutrition labels - make sure they are not packaged in a sodium solution) Canned or packed tuna (no more than 4 ounces at 1 serving) Beans and peas Soybeans) and tofu Eggs Nuts or nut butters without salt  Dairy Milk or milk powder Plant milks, such as rice and soy Yogurt, including Greek yogurt Small amounts of natural cheese (blocks of cheese) or reduced-sodium cheese can be used in moderation. (Swiss, ricotta, and fresh mozzarella cheese are lower in sodium than the others) Cream Cheese Low sodium cottage cheese  Vegetables Fresh and frozen vegetables without added sauces or salt Homemade soups (without salt) Low-sodium, salt-free or sodium-free canned vegetables and soups  Fruit Fresh and canned fruits Dried fruits, such as raisins, cranberries, and prunes  Oils Tub or liquid margarine, regular or without salt Canola, corn, peanut, olive, safflower, or sunflower oils  Condiments Fresh or dried herbs such as basil, bay leaf, dill, mustard (dry), nutmeg, paprika, parsley, rosemary, sage, or thyme.  Low sodium ketchup Vinegar  Lemon or lime juice Pepper, red pepper flakes, and cayenne. Hot sauce contains sodium, but if you use just a drop or two, it will not add up to much.  Salt-free or sodium-free seasoning mixes and marinades Simple salad dressings: vinegar and oil   Foods Not Recommended Food Group Foods Not Recommended  Grains Breads or crackers topped with salt Cereals (hot/cold) with more than 300 mg sodium per serving Biscuits, cornbread, and other quick breads prepared with baking soda Pre-packaged bread crumbs Seasoned and packaged rice and pasta mixes Self-rising flours  Protein Foods Cured meats: Bacon, ham, sausage, pepperoni and hot dogs Canned meats (chili, vienna sausage, or sardines) Smoked fish and meats Frozen meals that have more than 600 mg of sodium per serving Egg substitute (with added sodium)  Dairy  Buttermilk Processed cheese spreads Cottage cheese (1 cup may have over 500 mg of sodium; look for low-sodium.) American or feta cheese Shredded Cheese has more sodium than blocks of cheese String cheese  Vegetables Canned vegetables (unless they are salt-free, sodium-free or low sodium) Frozen vegetables with seasoning and sauces Sauerkraut and pickled vegetables Canned or dried soups (unless they are salt-free, sodium-free, or low sodium) French fries and onion rings  Fruit Dried fruits preserved with additives that have sodium  Oils Salted butter or margarine, all types of olives  Condiments Salt, sea salt, kosher salt, onion salt, and garlic salt Seasoning mixes with salt Bouillon cubes Ketchup Barbeque sauce and Worcestershire sauce unless low sodium Soy sauce Salsa, pickles, olives, relish Salad dressings: ranch, blue cheese, Italian, and French.   Low Sodium Sample 1-Day Menu  Breakfast 1 cup cooked oatmeal  1 slice whole wheat bread toast  1 tablespoon peanut butter without salt  1 banana  1 cup 1% milk  Lunch Tacos made with: 2 corn tortillas   cup black beans, low sodium   cup roasted or grilled chicken (without skin)   avocado  Squeeze of lime juice  1 cup salad greens  1 tablespoon  low-sodium salad dressing   cup strawberries  1 orange  Afternoon Snack 1/3 cup grapes  6 ounces yogurt  Evening Meal 3 ounces herb-baked fish  1 baked potato  2 teaspoons olive oil   cup cooked carrots  2 thick slices tomatoes on:  2 lettuce leaves  1 teaspoon olive oil  1 teaspoon balsamic vinegar  1 cup 1% milk  Evening Snack 1 apple   cup almonds without salt   Low-Sodium Vegetarian (Lacto-Ovo) Sample 1-Day Menu  Breakfast 1 cup cooked oatmeal  1 slice whole wheat toast  1 tablespoon peanut butter without salt  1 banana  1 cup 1% milk  Lunch Tacos made with: 2 corn tortillas   cup black beans, low sodium   cup roasted or grilled chicken (without skin)    avocado  Squeeze of lime juice  1 cup salad greens  1 tablespoon low-sodium salad dressing   cup strawberries  1 orange  Evening Meal Stir fry made with:  cup tofu  1 cup brown rice   cup broccoli   cup green beans   cup peppers   tablespoon peanut oil  1 orange  1 cup 1% milk  Evening Snack 4 strips celery  2 tablespoons hummus  1 hard-boiled egg   Low-Sodium Vegan Sample 1-Day Menu  Breakfast 1 cup cooked oatmeal  1 tablespoon peanut butter without salt  1 cup blueberries  1 cup soymilk fortified with calcium, vitamin B12, and vitamin D   Lunch 1 small whole wheat pita   cup cooked lentils  2 tablespoons hummus  4 carrot sticks  1 medium apple  1 cup soymilk fortified with calcium, vitamin B12, and vitamin D   Evening Meal Stir fry made with:  cup tofu  1 cup brown rice   cup broccoli   cup green beans   cup peppers   tablespoon peanut oil  1 cup cantaloupe  Evening Snack 1 cup soy yogurt   cup mixed nuts  Copyright 2020  Academy of Nutrition and Dietetics. All rights reserved  Sodium Free Flavoring Tips  When cooking, the following items may be used for flavoring instead of salt or seasonings that contain sodium. Remember: A little bit of spice goes a long way! Be careful not to overseason. Spice Blend Recipe (makes about ? cup) 5 teaspoons onion powder  2 teaspoons garlic powder  2 teaspoons paprika  2 teaspoon dry mustard  1 teaspoon crushed thyme leaves   teaspoon white pepper   teaspoon celery seed Food Item Flavorings  Beef Basil, bay leaf, caraway, curry, dill, dry mustard, garlic, grape jelly, green pepper, mace, marjoram, mushrooms (fresh), nutmeg, onion or onion powder, parsley, pepper, rosemary, sage  Chicken Basil, cloves, cranberries, mace, mushrooms (fresh), nutmeg, oregano, paprika, parsley, pineapple, saffron, sage, savory, tarragon, thyme, tomato, turmeric  Egg Chervil, curry, dill, dry mustard, garlic or garlic powder, green  pepper, jelly, mushrooms (fresh), nutmeg, onion powder, paprika, parsley, rosemary, tarragon, tomato  Fish Basil, bay leaf, chervil, curry, dill, dry mustard, green pepper, lemon juice, marjoram, mushrooms (fresh), paprika, pepper, tarragon, tomato, turmeric  Lamb Cloves, curry, dill, garlic or garlic powder, mace, mint, mint jelly, onion, oregano, parsley, pineapple, rosemary, tarragon, thyme  Pork Applesauce, basil, caraway, chives, cloves, garlic or garlic powder, onion or onion powder, rosemary, thyme  Veal Apricots, basil, bay leaf, currant jelly, curry, ginger, marjoram, mushrooms (fresh), oregano, paprika  Vegetables Basil, dill, garlic or garlic powder, ginger, lemon juice, mace, marjoram, nutmeg, onion or onion powder, tarragon,  tomato, sugar or sugar substitute, salt-free salad dressing, vinegar  Desserts Allspice, anise, cinnamon, cloves, ginger, mace, nutmeg, vanilla extract, other extracts   Copyright 2020  Academy of Nutrition and Dietetics. All rights reserved  Fluid Restricted Nutrition Therapy  You have been prescribed this diet because your condition affects how much fluid you can eat or drink. If your heart, liver, or kidneys aren't working properly, you may not be able to effectively eliminate fluids from the body and this may cause swelling (edema) in the legs, arms, and/or stomach. Drink no more than _________ liters or ________ ounces or ________cups of fluid per day.  You don't need to stop eating or drinking the same fluids you normally would, but you may need to eat or drink less than usual.  Your registered dietitian nutritionist will help you determine the correct amount of fluid to consume during the day Breakfast Include fluids taken with medications  Lunch Include fluids taken with medications  Dinner Include fluids taken with medications  Bedtime Snack Include fluids taken with medications     Tips What Are Fluids?  A fluid is anything that is liquid or  anything that would melt if left at room temperature. You will need to count these foods and liquids--including any liquid used to take medication--as part of your daily fluid intake. Some examples are: Alcohol (drink only with your doctor's permission)  Coffee, tea, and other hot beverages  Gelatin (Jell-O)  Gravy  Ice cream, sherbet, sorbet  Ice cubes, ice chips  Milk, liquid creamer  Nutritional supplements  Popsicles  Vegetable and fruit juices; fluid in canned fruit  Watermelon  Yogurt  Soft drinks, lemonade, limeade  Soups  Syrup How Do I Measure My Fluid Intake? Record your fluid intake daily.  Tip: Every day, each time you eat or drink fluids, pour water in the same amount into an empty container that can hold the same amount of fluids you are allowed daily. This may help you keep track of how much fluid you are taking in throughout the day.  To accurately keep track of how much liquid you take in, measure the size of the cups, glasses, and bowls you use. If you eat soup, measure how much of it is liquid and how much is solid (such as noodles, vegetables, meat). Conversions for Measuring Fluid Intake  Milliliters (mL) Liters (L) Ounces (oz) Cups (c)  1000 1 32 4  1200 1.2 40 5  1500 1.5 50 6 1/4  1800 1.8 60 7 1/2  2000 2 67 8 1/3  Tips to Reduce Your Thirst Chew gum or suck on hard candy.  Rinse or gargle with mouthwash. Do not swallow.  Ice chips or popsicles my help quench thirst, but this too needs to be calculated into the total restriction. Melt ice chips or cubes first to figure out how much fluid they produce (for example, experiment with melting  cup ice chips or 2 ice cubes).  Add a lemon wedge to your water.  Limit how much salt you take in. A high salt intake might make you thirstier.  Don't eat or drink all your allowed liquids at once. Space your liquids out through the day.  Use small glasses and cups and sip slowly. If allowed, take your medications with  fluids you eat or drink during a meal.   Fluid-Restricted Nutrition Therapy Sample 1-Day Menu  Breakfast 1 slice wheat toast  1 tablespoon peanut butter  1/2 cup yogurt (120 milliliters)  1/2 cup blueberries  1 cup milk (240 milliliters)   Lunch 3 ounces sliced turkey  2 slices whole wheat bread  1/2 cup lettuce for sandwich  2 slices tomato for sandwich  1 ounce reduced-fat, reduced-sodium cheese  1/2 cup fresh carrot sticks  1 banana  1 cup unsweetened tea (240 milliliters)   Evening Meal 8 ounces soup (240 milliliters)  3 ounces salmon  1/2 cup quinoa  1 cup green beans  1 cup mixed greens salad  1 tablespoon olive oil  1 cup coffee (240 milliliters)  Evening Snack 1/2 cup sliced peaches  1/2 cup frozen yogurt (120 milliliters)  1 cup water (240 milliliters)  Copyright 2020  Academy of Nutrition and Dietetics. All rights reserved   General, Healthful Nutrition Therapy  This handout provides you with the information youll need to follow a general, healthful diet, which can be tailored to your personal preferences. There are several benefits to following a general, healthful diet: It could mean less calories, less salt, less added sugars, and less saturated fat than many other diets. This outcome will depend on the foods you choose. Eating more whole grains, beans, lentils, fruits, vegetables, nuts, and seeds may improve how much fiber, vitamins, and minerals you eat.  It can lower your risk for health conditions like diabetes, heart disease, hypertension, stroke, and cancer. Your registered dietitian nutritionist (RDN) may recommend portion sizes based on your individual needs and personal and cultural preferences.  Tips Every day, eat a variety of fruits and vegetables in a variety of colors. Be sure to include lots of dark green, red, blue-purple, and orange vegetables. Choose whole grains for at least half of your grain selections. Eat more beans, peas, and  lentils. Try meatless alternatives. Get protein in your diet from eggs, fish, poultry, beans, peas, lentils, and nuts/nut butters. Low-fat or fat-free dairy products are also good sources of protein. Keep your salt intake to a minimum (less than 2300 milligrams per day). Limit use of salt, soy sauce, or fish sauce when cooking. Eat freshly prepared meals at home. Processed, prepackaged, and restaurant foods contain more salt. Choose fresh fruits and vegetables for snacks. Choose products with lower sodium content when grocery shopping. Limit your daily sugar intake. Sugar may be used in sauces, marinades, dressings, and condiments - even those that do not taste sweet.  Sugar can be found in honey, syrups, jelly, fruit juice, and fruit juice concentrate. Limit sugar-sweetened beverages like sodas and fruit juice, sugary snacks, and candy. Its best to choose products without added sugar, but if you do eat them, read labels carefully so you know how much sugar is in each portion. It is better to eat unsaturated fats than saturated fats. Use fats and oils in moderation, up to 5 servings per day. Unsaturated fat is found in fish, avocado, nuts, and oils like sunflower, canola, avocado and olive oils. Saturated fat is found in fatty meat, butter, ice cream, palm and coconut oil, cream, cheese, and lard. Many processed foods, fried foods, fast food items, convenience foods like frozen pizza and snack foods, and sweets including pies, cookies, and other pastries are high in fat. Check nutrition labels and choose these foods less often. Use vegetable oil instead of lard or butter for cooking. Boil, steam, or bake your food instead of deep frying in oil. Remove the fatty part of meats before cooking.  Foods to Choose or to Limit Choose a healthful balance of foods from each food group at  your meals. Your RDN may make individualized portion size recommendations based on your needs. Food Group Foods to  Choose Foods to Limit  Grains Whole wheat, barley, rye, buckwheat, corn, teff, quinoa, millet, amaranth, brown and wild rice, sorghum, and oats; focus on intact cooked whole grains Grain products, such as bread, rolls, prepared breakfast cereals, crackers, and pasta made from whole grains that are low in added sugars, saturated fat, and sodium Sweetened, low-fiber breakfast cereals Packaged (high sugar, refined ingredients) baked goods Snack crackers and chips made of refined ingredients, cheese crackers, butter crackers Breads made with refined ingredients and saturated fats, such as biscuits, frozen waffles, sweet breads, doughnuts, pastries, packaged baking mixes, pancakes, cakes, and cookies  Protein Foods Meat, including lean, trimmed cuts of beef, pork, or lamb a few times per week or less Poultry, including skinless chicken or turkey Seafood, including fish, shrimp, lobster, clams, and scallops at least twice per week. Focus on fatty fish, such as salmon, herring, and sardines, as a rich source of omega-3 fatty acids Eggs Nuts and seeds, such as peanuts, almonds, pistachios, and sunflower seeds (unsalted varieties) Nut and seed butters, such as peanut butter, almond butter, and sunflower seed butter (reduced-sodium varieties)  Soy foods such as tofu, tempeh, or soy nuts Plant protein-based meat alternatives, such as veggie burgers and sausages (reduced-sodium varieties) Unsalted beans, lentils, or peas at least a few times per week in place of other protein sources Marbled or fatty red meats (beef, pork, lamb), such as ribs Processed red meats, such as bacon, sausage, and ham Poultry (chicken and turkey) with skin Fried meats, poultry, or fish Dole food, shark, and [place] (may contain high levels of mercury) Deli meats, such as pastrami, bologna, or salami Fried eggs Salted beans, peas, lentils, nuts, seeds, or nut/seed butters Meat alternatives with high levels of sodium or  saturated fat  Dairy and Dairy Alternatives Low-fat or fat-free milk, yogurt (low in added sugars), cottage cheese, and cheeses Fortified soymilk or soy yogurt Whole milk, cream, cheeses made from whole milk, sour cream Yogurt or ice cream made from whole milk or with added sugar Cream cheese made from whole milk  Vegetables A variety of vegetables, including dark-green, red, blue-purple, and orange vegetables Low-sodium vegetable juices Canned or frozen vegetables with salt, fresh vegetables prepared with salt Fried vegetables Vegetables in cream sauce or cheese sauce Tomato or pasta sauce with high levels of salt or sugar  Fruit A variety of whole fruits, canned fruit packed in water, or dried fruit 100% fruit juice (limited to  cup per day) Fruits packed in syrup or made with added sugar  Fats and Oils Unsaturated vegetable oils, including olive, peanut, and canola oils Vegetable oil-based margarines and spreads Salad dressing and mayonnaise made from unsaturated vegetable oils Solid shortening or partially hydrogenated oils Solid margarine made with hydrogenated or partially hydrogenated oils Butter  Beverages Coffee and tea (unsweetened) Water Sweetened drinks, including sweetened coffee or tea drinks, soda, energy drinks, and sports drinks  Other Prepared foods, including soups, casseroles, salads, baked goods, and snacks made from recommended ingredients, with low levels of added saturated fat, added sugars, or salt Sugary and/or fatty desserts, candy, and other sweets; salt and seasonings that contain salt Fried foods   General, Healthful Diet Sample 1-Day Menu View Nutrient Info Breakfast 1 cup oatmeal  cup blueberries 1 ounce almonds 1 cup 1% milk or fortified soymilk 1 cup unsweetened coffee  Lunch 2 slices whole wheat bread 3 ounces turkey  slices 2 lettuce leaves 2 slices tomato 1 ounce reduced-fat, reduced sodium cheese  cup carrot sticks  cup hummus 1 banana 1  cup 1% milk or fortified soymilk 1 cup unsweetened tea  Evening Meal 4 ounces salmon, baked  cup cooked brown rice 1 cup green beans, cooked 1 cup mixed greens salad 1 teaspoon olive oil mixed with vinegar of choice 1 whole wheat dinner roll 1 teaspoon margarine, soft, tub (for roll) 1 cup water  Evening Snack 1 cup low-fat yogurt  cup sliced peaches     "

## 2024-02-09 NOTE — Evaluation (Signed)
 Physical Therapy Evaluation Patient Details Name: Jared Mccoy MRN: [id number] DOB: Jul 06, 1990 Today's Date: 02/09/2024  History of Present Illness  Pt is a 33 y.o. male admitted 12/28 with acute hypoxic and hypercapnic respiratory failure secondary to multifocal pneumonia. PMH:  Morbid obesity, OSA, a fib, HTN, chronic LE edema due to lymphedema, +smoker, +cocaine  Clinical Impression  Pt admitted with above diagnosis. PTA pt was living in a homeless shelter, independent. Pt currently with functional limitations due to the deficits listed below (see PT Problem List). On eval, pt mod I bed mobility, supervision transfers, and supervision amb 30' without AD. SpO2 99% on 4L at rest. Mobilized on RA with desat to 90%. 2/4 DOE. Returned to 4L. Pt will benefit from acute skilled PT to increase their independence and safety with mobility to allow discharge.  PT to follow acutely. No follow up services indicated. No DME needs.         If plan is discharge home, recommend the following:     Can travel by private vehicle        Equipment Recommendations None recommended by PT  Recommendations for Other Services       Functional Status Assessment Patient has had a recent decline in their functional status and demonstrates the ability to make significant improvements in function in a reasonable and predictable amount of time.     Precautions / Restrictions Precautions Precautions: Other (comment) Recall of Precautions/Restrictions: Intact Precaution/Restrictions Comments: morbid obesity      Mobility  Bed Mobility Overal bed mobility: Modified Independent             General bed mobility comments: increased time    Transfers Overall transfer level: Needs assistance Equipment used: None Transfers: Sit to/from Stand Sit to Stand: Supervision                Ambulation/Gait Ambulation/Gait assistance: Supervision Gait Distance (Feet): 30 Feet Assistive device:  None Gait Pattern/deviations: Step-through pattern, Wide base of support, Decreased stride length Gait velocity: decreased     General Gait Details: furniture walking in room. SpO2 99% on 4L. Mobilized on RA with desat to 90%. Returned to 4L at end of session. 2/4 DOE  Teacher, [name]       Balance Overall balance assessment: Mild deficits observed, not formally tested                                           Pertinent Vitals/Pain Pain Assessment Pain Assessment: No/denies pain    Home Living Family/patient expects to be discharged to:: Shelter/Homeless                   Additional Comments: Pt reports he has been living in a homeless shelter since Oct 2025.    Prior Function Prior Level of Function : Independent/Modified Independent             Mobility Comments: no AD at baseline       Extremity/Trunk Assessment   Upper Extremity Assessment Upper Extremity Assessment: Generalized weakness    Lower Extremity Assessment Lower Extremity Assessment: Generalized weakness    Cervical / Trunk Assessment Cervical / Trunk Assessment: Other exceptions Cervical / Trunk Exceptions: body habitus  Communication  Communication Communication: No apparent difficulties    Cognition Arousal: Alert Behavior During Therapy: WFL for tasks assessed/performed   PT - Cognitive impairments: No apparent impairments                         Following commands: Intact       Cueing Cueing Techniques: Verbal cues     General Comments      Exercises     Assessment/Plan    PT Assessment Patient needs continued PT services  PT Problem List Decreased strength;Decreased mobility;Cardiopulmonary status limiting activity;Decreased activity tolerance       PT Treatment Interventions Therapeutic exercise;Gait training;Balance training;Functional  mobility training;Therapeutic activities;Patient/family education    PT Goals (Current goals can be found in the Care Plan section)  Acute Rehab PT Goals Patient Stated Goal: to walk PT Goal Formulation: With patient Time For Goal Achievement: 02/23/24 Potential to Achieve Goals: Good    Frequency Min 2X/week     Co-evaluation               AM-PAC PT 6 Clicks Mobility  Outcome Measure Help needed turning from your back to your side while in a flat bed without using bedrails?: None Help needed moving from lying on your back to sitting on the side of a flat bed without using bedrails?: None Help needed moving to and from a bed to a chair (including a wheelchair)?: A Little Help needed standing up from a chair using your arms (e.g., wheelchair or bedside chair)?: A Little Help needed to walk in hospital room?: A Little Help needed climbing 3-5 steps with a railing? : A Lot 6 Click Score: 19    End of Session Equipment Utilized During Treatment: Oxygen Activity Tolerance: Patient tolerated treatment well Patient left: in bed;with call bell/phone within reach Nurse Communication: Mobility status PT Visit Diagnosis: Difficulty in walking, not elsewhere classified (R26.2);Muscle weakness (generalized) (M62.81)    Time: 8854-8840 PT Time Calculation (min) (ACUTE ONLY): 14 min   Charges:   PT Evaluation $PT Eval Moderate Complexity: 1 Mod   PT General Charges $$ ACUTE PT VISIT: 1 Visit         Jared MATSU., PT  Office # [phone]   Jared Mccoy 02/09/2024, 12:47 PM

## 2024-02-09 NOTE — Progress Notes (Signed)
" °   02/08/24 2209  BiPAP/CPAP/SIPAP  $ Non-Invasive Ventilator  Non-Invasive Vent Initial  $ Face Mask XL Yes  BiPAP/CPAP/SIPAP Pt Type Adult  BiPAP/CPAP/SIPAP SERVO  Mask Type Full face mask  Dentures removed? Not applicable  Mask Size Extra large  Set Rate 15 breaths/min  Respiratory Rate 35 breaths/min  IPAP 20 cmH20  EPAP 5 cmH2O  Pressure Support 15 cmH20  PEEP 5 cmH20  FiO2 (%) 40 %  Flow Rate 0 lpm  Minute Ventilation 16.3  Leak 25  Peak Inspiratory Pressure (PIP) 27  Tidal Volume (Vt) 500  Patient Home Machine No  Patient Home Mask No  Patient Home Tubing No  Auto Titrate No  Press High Alarm 35 cmH2O  Device Plugged into RED Power Outlet Yes  BiPAP/CPAP /SiPAP Vitals  Bilateral Breath Sounds Coarse crackles    "

## 2024-02-09 NOTE — TOC Initial Note (Addendum)
 Transition of Care [facility]) - Initial/Assessment Note    Patient Details  Name: Jared Mccoy MRN: [id number] Date of Birth: November 06, 1990  Transition of Care [facility]) CM/SW Contact:    Jared Mccoy, LCSWA Phone Number: 02/09/2024, 12:50 PM  Clinical Narrative:   Patient reports he's from home with three smaller children (ages 11, 5, and 7). Patient reports no prior DME hx. Patient stated he has [facility]. Patient confirmed he has a PCP and uses Psychiatric nurse listed in chart. Patient reports he has reliable transportation.   CSW placed substance use resources on patients AVS.   Expected Discharge Plan: Home/Self Care Barriers to Discharge: Continued Medical Work up   Patient Goals and CMS Choice Patient states their goals for this hospitalization and ongoing recovery are:: To go home   Choice offered to / list presented to : NA      Expected Discharge Plan and Services In-house Referral: NA Discharge Planning Services: NA Post Acute Care Choice: NA Living arrangements for the past 2 months: Single Family Home                                      Prior Living Arrangements/Services Living arrangements for the past 2 months: Single Family Home Lives with:: Minor Children Patient language and need for interpreter reviewed:: Yes Do you feel safe going back to the place where you live?: Yes      Need for Family Participation in Patient Care: No (Comment) Care giver support system in place?: No (comment)   Criminal Activity/Legal Involvement Pertinent to Current Situation/Hospitalization: No - Comment as needed  Activities of Daily Living      Permission Sought/Granted   Permission granted to share information with : No              Emotional Assessment Appearance:: Appears stated age Attitude/Demeanor/Rapport: Engaged Affect (typically observed): Stable Orientation: : Oriented to Situation, Oriented to  Time, Oriented to Place,  Oriented to Self Alcohol / Substance Use: Not Applicable Psych Involvement: No (comment)  Admission diagnosis:  Peripheral edema [R60.0] SOB (shortness of breath) [R06.02] Hypoxia [R09.02] Acute cough [R05.1] Patient Active Problem List   Diagnosis Date Noted   SOB (shortness of breath) 02/08/2024   Bacteremia due to Streptococcus 10/29/2023   Cellulitis of left lower extremity 10/25/2023   Morbid obesity with BMI of 60.0-69.9, adult (HCC) 10/25/2023   Hypertensive urgency 10/25/2023   Leg wound, left, initial encounter 10/25/2023   Leg edema 09/03/2023   Resistant hypertension 09/03/2023   Morbid obesity (HCC) 09/03/2023   Peripheral edema 09/03/2023   Paroxysmal atrial fibrillation (HCC) 07/13/2023   New onset a-fib (HCC) 07/12/2023   Excessive daytime sleepiness 07/09/2023   Obesity hypoventilation syndrome (HCC) 07/09/2023   Sepsis (HCC) 04/06/2023   Lymphedema 04/06/2023   Influenza A 04/06/2023   Leukocytosis 04/06/2023   AKI (acute kidney injury) 04/06/2023   Essential hypertension 04/06/2023   Hypokalemia 08/11/2022   Wound infection 08/09/2022   Multiple trauma 11/10/2016   PCP:  Jared Rosaline SQUIBB, NP Pharmacy:   [facility] [address] [place] [address] Phone: [phone] Fax: [phone]  [facility] [address] ([place]), [place] - [address] [place] ([place]) [place] [address] Phone: [phone] Fax: [phone]     Social Drivers of Health (SDOH) Social History: SDOH  Screenings   Food Insecurity: No Food Insecurity (12/17/2023)  Housing: High Risk (12/17/2023)  Transportation Needs: No Transportation Needs (12/17/2023)  Utilities: Not At Risk (12/17/2023)  Depression (PHQ2-9): Low Risk (01/13/2024)  Financial Resource Strain: Medium Risk (11/07/2023)  Tobacco Use: High Risk (01/13/2024)   SDOH Interventions:     Readmission Risk  Interventions    10/29/2023   12:27 PM  Readmission Risk Prevention Plan  PCP or Specialist Appt within 3-5 Days Complete  HRI or Home Care Consult Complete  Social Work Consult for Recovery Care Planning/Counseling Complete  Palliative Care Screening Not Applicable

## 2024-02-09 NOTE — ED Notes (Signed)
 Pt agreed to put BiPAP back on. Pt was placed back on BiPAP.

## 2024-02-09 NOTE — Progress Notes (Signed)
 OT Cancellation Note  Patient Details Name: Jared Mccoy MRN: [id number] DOB: 12-08-1990   Cancelled Treatment:    Reason Eval/Treat Not Completed: Other (comment).  Patient with PT earlier, then off the ED unit transitioning to new floor.  Continue efforts next date.    Jasmeen Fritsch D Zenobia Kuennen 02/09/2024, 3:40 PM 02/09/2024  RP, OTR/L  Acute Rehabilitation Services  Office:  [phone]

## 2024-02-09 NOTE — Progress Notes (Signed)
Placed patient on BIPAP for the night with oxygen set at 4lpm  

## 2024-02-09 NOTE — Plan of Care (Signed)
  Problem: Education: Goal: Ability to describe self-care measures that may prevent or decrease complications (Diabetes Survival Skills Education) will improve Outcome: Progressing   Problem: Coping: Goal: Ability to adjust to condition or change in health will improve Outcome: Progressing   Problem: Fluid Volume: Goal: Ability to maintain a balanced intake and output will improve Outcome: Progressing   Problem: Metabolic: Goal: Ability to maintain appropriate glucose levels will improve Outcome: Progressing   Problem: Nutritional: Goal: Maintenance of adequate nutrition will improve Outcome: Progressing   Problem: Skin Integrity: Goal: Risk for impaired skin integrity will decrease Outcome: Progressing   

## 2024-02-10 ENCOUNTER — Encounter (HOSPITAL_COMMUNITY): Payer: Self-pay | Admitting: Internal Medicine

## 2024-02-10 DIAGNOSIS — R051 Acute cough: Secondary | ICD-10-CM | POA: Diagnosis not present

## 2024-02-10 DIAGNOSIS — J188 Other pneumonia, unspecified organism: Secondary | ICD-10-CM | POA: Diagnosis not present

## 2024-02-10 DIAGNOSIS — I160 Hypertensive urgency: Secondary | ICD-10-CM

## 2024-02-10 DIAGNOSIS — R600 Localized edema: Secondary | ICD-10-CM | POA: Diagnosis not present

## 2024-02-10 LAB — BASIC METABOLIC PANEL WITH GFR
Anion gap: 6 (ref 5–15)
BUN: 16 mg/dL (ref 6–20)
CO2: 34 mmol/L — ABNORMAL HIGH (ref 22–32)
Calcium: 8.5 mg/dL — ABNORMAL LOW (ref 8.9–10.3)
Chloride: 97 mmol/L — ABNORMAL LOW (ref 98–111)
Creatinine, Ser: 1.3 mg/dL — ABNORMAL HIGH (ref 0.61–1.24)
GFR, Estimated: >60 mL/min
Glucose, Bld: 114 mg/dL — ABNORMAL HIGH (ref 70–99)
Potassium: 3.3 mmol/L — ABNORMAL LOW (ref 3.5–5.1)
Sodium: 137 mmol/L (ref 135–145)

## 2024-02-10 LAB — GLUCOSE, CAPILLARY
Glucose-Capillary: 108 mg/dL — ABNORMAL HIGH (ref 70–99)
Glucose-Capillary: 106 mg/dL — ABNORMAL HIGH (ref 70–99)
Glucose-Capillary: 106 mg/dL — ABNORMAL HIGH (ref 70–99)
Glucose-Capillary: 124 mg/dL — ABNORMAL HIGH (ref 70–99)

## 2024-02-10 LAB — MAGNESIUM: Magnesium: 2 mg/dL (ref 1.7–2.4)

## 2024-02-10 LAB — EXPECTORATED SPUTUM ASSESSMENT W GRAM STAIN, RFLX TO RESP C

## 2024-02-10 LAB — STREP PNEUMONIAE URINARY ANTIGEN: Strep Pneumo Urinary Antigen: NEGATIVE

## 2024-02-10 MED ORDER — HYDRALAZINE HCL 20 MG/ML IJ SOLN
10.0000 mg | INTRAMUSCULAR | Status: DC | PRN
  Administered 2024-02-13 – 2024-02-15 (×3): 10 mg via INTRAVENOUS
  Filled 2024-02-10 (×3): qty 1

## 2024-02-10 MED ORDER — FERROUS SULFATE 325 (65 FE) MG PO TABS
325.0000 mg | ORAL_TABLET | Freq: Every day | ORAL | Status: DC
  Administered 2024-02-11 – 2024-02-15 (×5): 325 mg via ORAL
  Filled 2024-02-10 (×5): qty 1

## 2024-02-10 MED ORDER — POTASSIUM CHLORIDE CRYS ER 20 MEQ PO TBCR
40.0000 meq | EXTENDED_RELEASE_TABLET | Freq: Two times a day (BID) | ORAL | Status: AC
  Administered 2024-02-10 (×2): 40 meq via ORAL
  Filled 2024-02-10 (×3): qty 2

## 2024-02-10 MED ORDER — IPRATROPIUM-ALBUTEROL 0.5-2.5 (3) MG/3ML IN SOLN: 3.0000 mL | RESPIRATORY_TRACT | Status: DC | PRN

## 2024-02-10 MED ORDER — FERROUS SULFATE 325 (65 FE) MG PO TBEC
325.0000 mg | DELAYED_RELEASE_TABLET | Freq: Every day | ORAL | Status: DC
  Filled 2024-02-10: qty 1

## 2024-02-10 MED ORDER — CARVEDILOL 25 MG PO TABS
25.0000 mg | ORAL_TABLET | Freq: Two times a day (BID) | ORAL | Status: DC
  Administered 2024-02-10 – 2024-02-15 (×10): 25 mg via ORAL
  Filled 2024-02-10 (×10): qty 1

## 2024-02-10 MED ORDER — LISINOPRIL 10 MG PO TABS
10.0000 mg | ORAL_TABLET | Freq: Every day | ORAL | Status: DC
  Administered 2024-02-10 – 2024-02-11 (×2): 10 mg via ORAL
  Filled 2024-02-10 (×2): qty 1

## 2024-02-10 MED ORDER — AMLODIPINE BESYLATE 10 MG PO TABS
10.0000 mg | ORAL_TABLET | Freq: Every day | ORAL | Status: DC
  Administered 2024-02-10 – 2024-02-11 (×2): 10 mg via ORAL
  Filled 2024-02-10 (×2): qty 1

## 2024-02-10 NOTE — Consult Note (Addendum)
 WOC Nurse Consult Note: Reason for Consult: Consult requested for bilat legs.  Performed remotely after review of progress notes and photos in the EMR.  Bilat legs with generalized lymphadema and scattered partial and fill thickness wounds, red and moist.        Topical treatment orders provided for bedside nurses to perform as follows to absorb drainage and promote healing and reduce edema as follows: Cut piece of Aquacel Soila # [phone]) and tuck over bilat leg wounds Q day, then cover with foam dressings.  Change foam dressings Q 3 days or PRN soiling. Apply Ace wrap to bilat legs each time, beginning just behind toes to below knees.   Please re-consult if further assistance is needed.  Thank-you,  Stephane Fought MSN, RN, CWOCN, CWCN-AP, CNS Contact Mon-Fri 0700-1500: [phone]

## 2024-02-10 NOTE — Progress Notes (Addendum)
 Initial Nutrition Assessment  DOCUMENTATION CODES:   Not applicable, Morbid obesity  INTERVENTION:  Add double portion proteins  Add MVI w/ minerals Education provided regarding weight loss and preventing fluid overload Refer to outpatient [facility] for ongoing nutrition education   NUTRITION DIAGNOSIS:  Increased nutrient needs related to acute illness as evidenced by estimated needs.  GOAL:  Patient will meet greater than or equal to 90% of their needs  MONITOR:  PO intake, Weight trends, Labs  REASON FOR ASSESSMENT:  Consult Assessment of nutrition requirement/status, Diet education  ASSESSMENT:   Pt with PMH significant for: morbid obesity w/ OSA, HTN, PVD, afib, chronic BLE edema 2/2 lymphedema. Presents with SOB and hypoxia. Admitted for acute hypoxic and hypercapnic respiratory failure 2/2 PNA in setting of OSA.  Pt started on IV ABX and ordered BiPAP as needed. UDS positive for cocaine.   Average Meal Intake 12/29: 100% x2 documented meals 12/30: 75-100% x2 documented meals  Met with patient at bedside today. He is up in chair sleeping and with BiPAP in place. Unable to stay awake long enough to engage with RD. States he has been eating a lot, in recent past. Unable to glean typical day's intake or specific nutrition-related history to assess. Will attempt on follow up.   Difficulty engaging patient enough to promote retention of education being provided. Will add to AVS and attempt in-person education on follow up.   Admit Weight: 258.2 kg (first measured) Current Weight: 266.7 kg  Showing significant fluctuations in body weight overnight. Likely r/t body habitus. Per chart review, patient has fluctuated between 234-256 kg over last seven months. Likely somewhat skewed by large body habitus and fluid status.   Meds: thiamine , IV ABX   Labs from 12/29 reviewed:  Na+ 138 (wdl) K+ 3.7 (wdl) CBGs 92-124 x24 hours A1c 5.7 (01/2024)  NUTRITION - FOCUSED PHYSICAL  EXAM:  Unable to discern significant muscle or fat depletions on exam. Excessive adipose tissue and lymphedema could be skewing these observations. He is likely meeting his needs, however, based off his current documented and reported intake.   Flowsheet Row Most Recent Value  Orbital Region No depletion  Upper Arm Region No depletion  Thoracic and Lumbar Region No depletion  Buccal Region No depletion  Temple Region No depletion  Clavicle Bone Region No depletion  Clavicle and Acromion Bone Region No depletion  Scapular Bone Region No depletion  Dorsal Hand No depletion  Patellar Region Unable to assess  [lymphedema]  Anterior Thigh Region Unable to assess  [lymphedema]  Posterior Calf Region Unable to assess  [lymphedema]  Edema (RD Assessment) Severe  Hair Reviewed  Eyes Unable to assess  Mouth Unable to assess  Skin Reviewed  Nails Reviewed    Diet Order:   Diet Order             Diet 2 gram sodium Room service appropriate? Yes; Fluid consistency: Thin  Diet effective now             EDUCATION NEEDS:   Education needs have been addressed  Skin:  Skin Assessment: Reviewed RN Assessment  Last BM:  PTA  Height:  Ht Readings from Last 1 Encounters:  02/09/24 6' (1.829 m)   Weight:  Wt Readings from Last 1 Encounters:  02/10/24 (!) 266.7 kg    Ideal Body Weight:  80.9 kg  BMI:  Body mass index is 79.75 kg/m.  Estimated Nutritional Needs:   Kcal:  2200-2400 kcals  Protein:  100-115g  Fluid:  >2L/day  Blair Deaner MS, RD, LDN Registered Dietitian I Clinical Nutrition RD Inpatient Contact Info in Amion

## 2024-02-10 NOTE — Evaluation (Signed)
 Occupational Therapy Evaluation Patient Details Name: Jared Mccoy MRN: [id number] DOB: 1990/09/26 Today's Date: 02/10/2024   History of Present Illness   Pt is a 33 y.o. male admitted 12/28 with acute hypoxic and hypercapnic respiratory failure secondary to multifocal pneumonia. PMH:  Morbid obesity, OSA, a fib, HTN, chronic LE edema due to lymphedema, +smoker, +cocaine     Clinical Impressions Pt c/o fatigue, SOB. O2 saturation remained at 90-92% during session on RA. Pt lives at homeless shelter, PLOF mod I, has trouble with LB bathing/dressing at baseline. Pt currently close to baseline but limited due to poor activity tolerance. Pt able to get in/out of bed using bed rails, mod I. Pt transfers to recliner and ambulates short distances around room supervision for safety. Pt given long-handled sponge and reacher. Pt to be follow by mobility to increased activity tolerance, no further acute OT needs, Pt denies follow up needs, denies need for DME. Signing off.      If plan is discharge home, recommend the following:   Assist for transportation;Help with stairs or ramp for entrance     Functional Status Assessment   Patient has had a recent decline in their functional status and demonstrates the ability to make significant improvements in function in a reasonable and predictable amount of time.     Equipment Recommendations   None recommended by OT     Recommendations for Other Services         Precautions/Restrictions   Precautions Precautions: Other (comment) Recall of Precautions/Restrictions: Intact Precaution/Restrictions Comments: morbid obesity Restrictions Weight Bearing Restrictions Per Provider Order: No     Mobility Bed Mobility Overal bed mobility: Modified Independent             General bed mobility comments: increased time using bed rails    Transfers Overall transfer level: Needs assistance Equipment used: None Transfers: Sit  to/from Stand Sit to Stand: Supervision           General transfer comment: supervision for safety      Balance Overall balance assessment: Mild deficits observed, not formally tested                                         ADL either performed or assessed with clinical judgement   ADL Overall ADL's : At baseline;Needs assistance/impaired Eating/Feeding: Independent   Grooming: Modified independent   Upper Body Bathing: Modified independent   Lower Body Bathing: Minimal assistance;With adaptive equipment;Sit to/from stand;Sitting/lateral leans   Upper Body Dressing : Modified independent   Lower Body Dressing: Minimal assistance;With adaptive equipment;Sitting/lateral leans;Sit to/from stand   Toilet Transfer: Supervision/safety           Functional mobility during ADLs: Supervision/safety General ADL Comments: Pt supervision for safety with ambulation. increased time due to SOB, frequent rest breaks. Pt given long handled brush with reacher, wears slip on sandals at baseline.     Vision         Perception         Praxis         Pertinent Vitals/Pain Pain Assessment Pain Assessment: 0-10 Pain Score: 6  Pain Location: R leg Pain Descriptors / Indicators: Constant, Discomfort Pain Intervention(s): Monitored during session     Extremity/Trunk Assessment Upper Extremity Assessment Upper Extremity Assessment: Overall WFL for tasks assessed   Lower Extremity Assessment Lower Extremity Assessment: Defer to PT evaluation  Communication Communication Communication: No apparent difficulties   Cognition Arousal: Lethargic Behavior During Therapy: Flat affect Cognition: No apparent impairments                               Following commands: Intact       Cueing  General Comments   Cueing Techniques: Verbal cues      Exercises     Shoulder Instructions      Home Living Family/patient expects to be  discharged to:: Shelter/Homeless                                 Additional Comments: Pt reports he has been living in a homeless shelter since Oct 2025.      Prior Functioning/Environment Prior Level of Function : Independent/Modified Independent             Mobility Comments: no AD at baseline      OT Problem List: Decreased strength;Increased edema;Obesity;Cardiopulmonary status limiting activity   OT Treatment/Interventions:        OT Goals(Current goals can be found in the care plan section)   Acute Rehab OT Goals Patient Stated Goal: To improve breathing OT Goal Formulation: With patient Time For Goal Achievement: 02/24/24 Potential to Achieve Goals: Good   OT Frequency:       Co-evaluation              AM-PAC OT 6 Clicks Daily Activity     Outcome Measure Help from another person eating meals?: None Help from another person taking care of personal grooming?: None Help from another person toileting, which includes using toliet, bedpan, or urinal?: None Help from another person bathing (including washing, rinsing, drying)?: A Little Help from another person to put on and taking off regular upper body clothing?: None Help from another person to put on and taking off regular lower body clothing?: A Little 6 Click Score: 22   End of Session Nurse Communication: Mobility status  Activity Tolerance: Patient limited by fatigue Patient left: in chair;with call bell/phone within reach  OT Visit Diagnosis: Other abnormalities of gait and mobility (R26.89);Muscle weakness (generalized) (M62.81)                Time: 1001-1025 OT Time Calculation (min): 24 min Charges:  OT General Charges $OT Visit: 1 Visit OT Evaluation $OT Eval Low Complexity: 1 Low OT Treatments $Self Care/Home Management : 8-22 mins  [address] OTR/L   Elouise JONELLE Bott 02/10/2024, 10:34 AM

## 2024-02-10 NOTE — Progress Notes (Signed)
 Heart Failure Navigator Progress Note  Assessed for Heart & Vascular TOC clinic readiness.  Patient does not meet criteria due to . Acute hypoxic and hypercapnic respiratory failure secondary to multifocal pneumonia in the setting of obstructive sleep apnea and obesity hypoventilation syndrome per MD. No HF TOC.   Navigator will sign off at this time.    Stephane Haddock, BSN, Scientist, Clinical (histocompatibility And Immunogenetics) Only

## 2024-02-10 NOTE — Progress Notes (Signed)
 "        [facility]                                                                               Jared Mccoy, is a 33 y.o. adult, DOB - October 16, 1990, FMW:[id number] Admit date - 02/08/2024    Outpatient Primary MD for the patient is Celestia Rosaline SQUIBB, NP  LOS - 2  days    Brief summary   Jared Mccoy is a 33 y.o. adult with past medical history  of  Morbid obesity with OSA, paroxysmal A-fib not on AC, HTN, chronic lower extremity edema  from lymphedema and  coming for sob and hypoxia.  CT angio showing  Multifocal pneumonia of the right lower and upper lobes. Recommend follow-up CT in 3 months to evaluate for complete resolution. Right hilar lymphadenopathy that may be reactive in etiology - recommend attention on follow-up. Enlarged main pulmonary artery - correlate for pulmonary hypertension.   Assessment & Plan    Assessment and Plan:  Acute hypoxic and hypercapnic respiratory failure secondary to multifocal pneumonia in the setting of obstructive sleep apnea and obesity hypoventilation syndrome. Patient was started on broad-spectrum IV antibiotics, 4 L of nasal cannula oxygen to keep sats greater than 90%. Flu A and B, COVID and RSV are negative.  Follow-up blood cultures. Check urine for streptococcal and Legionella antigen CT angiogram did not show any acute PE at this time   Hypertensive Urgency Restarted home meds Coreg  25 mg twice daily, hydralazine  100 mg every 8 hours, amlodipine  10 mg daily, added lisinopril  10 mg daily. Prn hydralazine  IV .    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   History of obstructive sleep apnea/obesity hypoventilation syndrome Pulmonary hypertension Continue with CPAP at night   Paroxysmal atrial fibrillation Rate controlled, not on any anticoagulation at this time.    Chronic lymphedema with advanced skin changes secondary to longstanding extensive lymphedema and  impaired mobility Recommend compressive wraps. Wound care consulted for skin break present on admission.  Outpatient lymphedema clinic referral Therapy evaluations for mobility assistance   Right hilar lymphadenopathy on CT angiogram this admission And CT of the abdomen and pelvis in September 2025 showing extensive lymphadenopathy. Findings concerning for lymphoproliferative disorder Recommend outpatient follow-up with a repeat CT abd and pelvis.    Cocaine abuse:  UDS positive for cocaine TOC for resources    Normocytic anemia Anemia panel showing iron deficiency. Iron  supplementation added.    Morbid obesity Recommend outpatient follow-up with bariatric surgery.    Estimated body mass index is 79.75 kg/m as calculated from the following:   Height as of this encounter: 6' (1.829 m).   Weight as of this encounter: 266.7 kg.  Code Status: Full code DVT Prophylaxis:  heparin  injection 5,000 Units Start: 02/08/24 1400   Level of Care: Level of care: Progressive Family Communication: None at bedside  Disposition Plan:     Remains inpatient appropriate: Pending clinical improvement  Procedures:  CT angiogram of the chest Consultants:   None  Antimicrobials:   Anti-infectives (From admission, onward)    Start     Dose/Rate Route Frequency  Ordered Stop   02/09/24 0600  piperacillin -tazobactam (ZOSYN ) IVPB 3.375 g       Placed in Followed by Linked Group   3.375 g 12.5 mL/hr over 240 Minutes Intravenous Every 8 hours 02/08/24 1915     02/08/24 1930  piperacillin -tazobactam (ZOSYN ) IVPB 3.375 g       Placed in Followed by Linked Group   3.375 g 100 mL/hr over 30 Minutes Intravenous  Once 02/08/24 1915 02/08/24 2028        Medications  Scheduled Meds:  amLODipine   10 mg Oral Daily   carvedilol   25 mg Oral BID WC   [START ON 02/11/2024] ferrous sulfate  325 mg Oral Q breakfast   heparin   5,000 Units Subcutaneous Q8H   hydrALAZINE   100 mg Oral Q8H    nicotine   21 mg Transdermal Daily   potassium chloride   40 mEq Oral BID   sodium chloride  flush  3 mL Intravenous Q12H   thiamine   100 mg Oral Daily   Continuous Infusions:  piperacillin -tazobactam (ZOSYN )  IV 3.375 g (02/10/24 1222)   PRN Meds:.acetaminophen  **OR** acetaminophen , docusate sodium , hydrALAZINE , ipratropium-albuterol , oxyCODONE     Subjective:   Jared Mccoy was seen and examined today.  Patient on CPAP this afternoon.   Objective:   Vitals:   02/10/24 1140 02/10/24 1217 02/10/24 1319 02/10/24 1323  BP: (!) 199/100 (!) 173/106 (!) 197/99 (!) 194/92  Pulse:      Resp:      Temp:      TempSrc:      SpO2:      Weight:      Height:        Intake/Output Summary (Last 24 hours) at 02/10/2024 1611 Last data filed at 02/10/2024 1257 Gross per 24 hour  Intake 660.46 ml  Output 1220 ml  Net -559.54 ml   Filed Weights   02/08/24 0900 02/09/24 1233 02/10/24 0411  Weight: (!) 256 kg (!) 258.2 kg (!) 266.7 kg     Exam General exam:  Morbidly obese young gentleman, not in distress.  Respiratory system: diminished air entry from body habitus. On 4 LIT of oxygen.  Cardiovascular system: S1S2. RRR,  Gastrointestinal system: Abdomen is soft BS + Central nervous system:  Alert and comfortable.  Extremities: Bilateral chronic lymphedema on the lower extremities with advanced skin changes Skin:Chronic lymphedema of the lower extremities.  Psychiatry: mood is appropriate.    Data Reviewed:  I have personally reviewed following labs and imaging studies   CBC Lab Results  Component Value Date   WBC 9.2 02/09/2024   RBC 4.48 02/09/2024   HGB 10.3 (L) 02/09/2024   HCT 35.7 (L) 02/09/2024   MCV 79.7 (L) 02/09/2024   MCH 23.0 (L) 02/09/2024   PLT 292 02/09/2024   MCHC 28.9 (L) 02/09/2024   RDW 19.2 (H) 02/09/2024   LYMPHSABS 0.7 10/25/2023   MONOABS 0.6 10/25/2023   EOSABS 0.1 10/25/2023   BASOSABS 0.0 10/25/2023     Last metabolic panel Lab Results   Component Value Date   NA 137 02/10/2024   K 3.3 (L) 02/10/2024   CL 97 (L) 02/10/2024   CO2 34 (H) 02/10/2024   BUN 16 02/10/2024   CREATININE 1.30 (H) 02/10/2024   GLUCOSE 114 (H) 02/10/2024   GFRNONAA >60 02/10/2024   GFRAA >60 08/12/2019   CALCIUM 8.5 (L) 02/10/2024   PHOS 3.3 10/29/2023   PROT 7.4 02/09/2024   ALBUMIN 3.4 (L) 02/09/2024   LABGLOB 3.3 09/11/2023   BILITOT  0.5 02/09/2024   ALKPHOS 76 02/09/2024   AST 17 02/09/2024   ALT 11 02/09/2024   ANIONGAP 6 02/10/2024    CBG (last 3)  Recent Labs    02/10/24 0533 02/10/24 1107 02/10/24 1606  GLUCAP 108* 106* 106*      Coagulation Profile: No results for input(s): INR, PROTIME in the last 168 hours.   Radiology Studies: No results found.      Elgie Butter M.D. [facility] 02/10/2024, 4:11 PM  Available via Epic secure chat 7am-7pm After 7 pm, please refer to night coverage provider listed on amion.    "

## 2024-02-11 DIAGNOSIS — Z6841 Body Mass Index (BMI) 40.0 and over, adult: Secondary | ICD-10-CM

## 2024-02-11 DIAGNOSIS — R600 Localized edema: Secondary | ICD-10-CM | POA: Diagnosis not present

## 2024-02-11 DIAGNOSIS — I5033 Acute on chronic diastolic (congestive) heart failure: Secondary | ICD-10-CM | POA: Diagnosis not present

## 2024-02-11 DIAGNOSIS — R0602 Shortness of breath: Secondary | ICD-10-CM | POA: Diagnosis not present

## 2024-02-11 DIAGNOSIS — J9622 Acute and chronic respiratory failure with hypercapnia: Secondary | ICD-10-CM | POA: Diagnosis not present

## 2024-02-11 DIAGNOSIS — J9601 Acute respiratory failure with hypoxia: Secondary | ICD-10-CM

## 2024-02-11 DIAGNOSIS — E66813 Obesity, class 3: Secondary | ICD-10-CM

## 2024-02-11 DIAGNOSIS — I5031 Acute diastolic (congestive) heart failure: Secondary | ICD-10-CM | POA: Insufficient documentation

## 2024-02-11 DIAGNOSIS — J189 Pneumonia, unspecified organism: Secondary | ICD-10-CM | POA: Diagnosis not present

## 2024-02-11 DIAGNOSIS — J9621 Acute and chronic respiratory failure with hypoxia: Secondary | ICD-10-CM

## 2024-02-11 LAB — GLUCOSE, CAPILLARY
Glucose-Capillary: 98 mg/dL (ref 70–99)
Glucose-Capillary: 115 mg/dL — ABNORMAL HIGH (ref 70–99)
Glucose-Capillary: 95 mg/dL (ref 70–99)
Glucose-Capillary: 99 mg/dL (ref 70–99)

## 2024-02-11 LAB — CBC WITH DIFFERENTIAL/PLATELET
Abs Immature Granulocytes: 0.03 K/uL (ref 0.00–0.07)
Basophils Absolute: 0 K/uL (ref 0.0–0.1)
Basophils Relative: 0 %
Eosinophils Absolute: 0.2 K/uL (ref 0.0–0.5)
Eosinophils Relative: 2 %
HCT: 33.9 % — ABNORMAL LOW (ref 39.0–52.0)
Hemoglobin: 10 g/dL — ABNORMAL LOW (ref 13.0–17.0)
Immature Granulocytes: 0 %
Lymphocytes Relative: 17 %
Lymphs Abs: 1.7 K/uL (ref 0.7–4.0)
MCH: 23.5 pg — ABNORMAL LOW (ref 26.0–34.0)
MCHC: 29.5 g/dL — ABNORMAL LOW (ref 30.0–36.0)
MCV: 79.6 fL — ABNORMAL LOW (ref 80.0–100.0)
Monocytes Absolute: 1.2 K/uL — ABNORMAL HIGH (ref 0.1–1.0)
Monocytes Relative: 13 %
Neutro Abs: 6.6 K/uL (ref 1.7–7.7)
Neutrophils Relative %: 68 %
Platelets: 312 K/uL (ref 150–400)
RBC: 4.26 MIL/uL (ref 4.22–5.81)
RDW: 18.8 % — ABNORMAL HIGH (ref 11.5–15.5)
WBC: 9.7 K/uL (ref 4.0–10.5)
nRBC: 0 % (ref 0.0–0.2)

## 2024-02-11 LAB — BASIC METABOLIC PANEL WITH GFR
Anion gap: 4 — ABNORMAL LOW (ref 5–15)
BUN: 15 mg/dL (ref 6–20)
CO2: 36 mmol/L — ABNORMAL HIGH (ref 22–32)
Calcium: 8.8 mg/dL — ABNORMAL LOW (ref 8.9–10.3)
Chloride: 98 mmol/L (ref 98–111)
Creatinine, Ser: 1.26 mg/dL — ABNORMAL HIGH (ref 0.61–1.24)
GFR, Estimated: >60 mL/min
Glucose, Bld: 120 mg/dL — ABNORMAL HIGH (ref 70–99)
Potassium: 4 mmol/L (ref 3.5–5.1)
Sodium: 138 mmol/L (ref 135–145)

## 2024-02-11 LAB — LEGIONELLA PNEUMOPHILA SEROGP 1 UR AG: L. pneumophila Serogp 1 Ur Ag: NEGATIVE

## 2024-02-11 MED ORDER — POTASSIUM CHLORIDE CRYS ER 20 MEQ PO TBCR
40.0000 meq | EXTENDED_RELEASE_TABLET | Freq: Once | ORAL | Status: AC
  Administered 2024-02-11: 40 meq via ORAL
  Filled 2024-02-11: qty 2

## 2024-02-11 MED ORDER — GUAIFENESIN-DM 100-10 MG/5ML PO SYRP
5.0000 mL | ORAL_SOLUTION | ORAL | Status: DC | PRN
  Administered 2024-02-11 (×2): 5 mL via ORAL
  Filled 2024-02-11 (×2): qty 5

## 2024-02-11 MED ORDER — SPIRONOLACTONE 25 MG PO TABS
50.0000 mg | ORAL_TABLET | Freq: Every day | ORAL | Status: DC
  Administered 2024-02-11 – 2024-02-15 (×5): 50 mg via ORAL
  Filled 2024-02-11 (×5): qty 2

## 2024-02-11 MED ORDER — FUROSEMIDE 10 MG/ML IJ SOLN
80.0000 mg | Freq: Two times a day (BID) | INTRAMUSCULAR | Status: DC
  Administered 2024-02-11 – 2024-02-15 (×9): 80 mg via INTRAVENOUS
  Filled 2024-02-11 (×9): qty 8

## 2024-02-11 MED ORDER — EMPAGLIFLOZIN 10 MG PO TABS
10.0000 mg | ORAL_TABLET | Freq: Every day | ORAL | Status: DC
  Administered 2024-02-11 – 2024-02-15 (×5): 10 mg via ORAL
  Filled 2024-02-11 (×5): qty 1

## 2024-02-11 MED ORDER — HYDRALAZINE HCL 25 MG PO TABS
25.0000 mg | ORAL_TABLET | Freq: Three times a day (TID) | ORAL | Status: DC
  Administered 2024-02-11 – 2024-02-12 (×3): 25 mg via ORAL
  Filled 2024-02-11 (×3): qty 1

## 2024-02-11 NOTE — Progress Notes (Signed)
 Physical Therapy Treatment Patient Details Name: Jared Mccoy MRN: [id number] DOB: Mar 03, 1990 Today's Date: 02/11/2024   History of Present Illness Pt is a 33 y.o. male admitted 12/28 with acute hypoxic and hypercapnic respiratory failure secondary to multifocal pneumonia. PMH:  Morbid obesity, OSA, a fib, HTN, chronic LE edema due to lymphedema, +smoker, +cocaine    PT Comments  Pt deferring ambulation at this time reporting he has been mobilizing frequently throughout the day, but is willing to participate in LE strengthening. Pt participated in LE strengthening exercises (seated marches, LAQ, ankle pumps, hip ABD, hip ADD) demonstrating impaired core strength as pt has difficulty maintaining trunk upright throughout exercises, utilizing BUEs on recliner arms to assist. Pt was encouraged to perform LE exercises multiple times per day to assist with improving overall mobility. PT will continue to treat pt while he is admitted. No follow up therapies recommended at this time.     If plan is discharge home, recommend the following: Assist for transportation   Can travel by private vehicle        Equipment Recommendations  None recommended by PT    Recommendations for Other Services       Precautions / Restrictions Precautions Precautions: Other (comment) Recall of Precautions/Restrictions: Intact Precaution/Restrictions Comments: morbid obesity Restrictions Weight Bearing Restrictions Per Provider Order: No     Mobility  Bed Mobility Overal bed mobility: Modified Independent             General bed mobility comments: pt received in recliner and left in recliner    Transfers                        Ambulation/Gait                   Stairs             Wheelchair Mobility     Tilt Bed    Modified [name]       Balance Overall balance assessment: Needs assistance Sitting-balance support: No upper extremity  supported, Feet supported Sitting balance-Leahy Scale: Good Sitting balance - Comments: Pt able to shift weight for LE strengthening exercises but displays posterior lean throughout seated hip flexion when cued to not stabilize with UEs on recliner arms. Postural control: Posterior lean                                  Communication Communication Communication: No apparent difficulties  Cognition Arousal: Alert Behavior During Therapy: Flat affect   PT - Cognitive impairments: No apparent impairments                         Following commands: Intact      Cueing Cueing Techniques: Verbal cues  Exercises General Exercises - Lower Extremity Ankle Circles/Pumps: Strengthening, Both, 10 reps, Seated Long Arc Quad: Strengthening, Both, 10 reps, Seated Hip ABduction/ADduction: Strengthening, Both, 10 reps, Seated Hip Flexion/Marching: Strengthening, Both, 10 reps, Seated    General Comments General comments (skin integrity, edema, etc.): notable swelling BLEs. VSS      Pertinent Vitals/Pain Pain Assessment Pain Assessment: Faces Faces Pain Scale: Hurts little more Pain Location: BLEs Pain Descriptors / Indicators: Constant, Discomfort, Grimacing, Guarding Pain Intervention(s): Limited activity within patient's tolerance, Monitored during session    Home Living  Prior Function            PT Goals (current goals can now be found in the care plan section) Acute Rehab PT Goals Patient Stated Goal: to walk PT Goal Formulation: With patient Time For Goal Achievement: 02/23/24 Potential to Achieve Goals: Good Progress towards PT goals: Progressing toward goals    Frequency    Min 2X/week      PT Plan      Co-evaluation              AM-PAC PT 6 Clicks Mobility   Outcome Measure  Help needed turning from your back to your side while in a flat bed without using bedrails?: None Help needed moving  from lying on your back to sitting on the side of a flat bed without using bedrails?: None Help needed moving to and from a bed to a chair (including a wheelchair)?: A Little Help needed standing up from a chair using your arms (e.g., wheelchair or bedside chair)?: A Little Help needed to walk in hospital room?: A Little Help needed climbing 3-5 steps with a railing? : Total 6 Click Score: 18    End of Session Equipment Utilized During Treatment: Oxygen (6L) Activity Tolerance: Patient tolerated treatment well Patient left: in chair;with call bell/phone within reach Nurse Communication: Mobility status PT Visit Diagnosis: Difficulty in walking, not elsewhere classified (R26.2);Muscle weakness (generalized) (M62.81)     Time: 8552-8494 PT Time Calculation (min) (ACUTE ONLY): 18 min  Charges:    $Therapeutic Exercise: 8-22 mins PT General Charges $$ ACUTE PT VISIT: 1 Visit                     Leontine Hilt DPT Acute Rehab Services [phone] Prefer contact via chat    Leontine KATHEE Hilt 02/11/2024, 4:43 PM

## 2024-02-11 NOTE — Progress Notes (Signed)
 " PROGRESS NOTE    Jared Mccoy  FMW:[id number] DOB: 09/09/1990 DOA: 02/08/2024 PCP: Celestia Rosaline SQUIBB, NP  33/M with morbid obesity, OSA, paroxysmal A-fib not on anticoagulation, chronic edema, concern for lymphedema presented to the ED with shortness of breath and hypoxia.  In the ED no fever or leukocytosis, CT a chest with possible multifocal pneumonia and adenopathy, enlarged main pulmonary artery, correlate for pulmonary hypertension - Admitted, started on antibiotics for multifocal pneumonia   Subjective: -Still feels short of breath, reports 100 pound weight gain after he was taken off diuretics 3 months ago - Compliant with CPAP here for -Dr.Akula's notes reviewed  Assessment and Plan:  Pneumonia - Did have a productive cough and concern for multifocal pneumonia on imaging, - Sputum culture is reintubated - No improvement with 4 days of ABX, suspect CHF is contributing as well - Will complete 5-day course, repeat x-ray in a.m. - Also check SLP eval, check resp virus panel  Acute on chronic D CHF, ? RV failure Pulmonary hypertension -ECHO-9/25 w/ EF 65-70, ? Normal RV, indeterminate Diastolic fxn -pt reports 100lb weight gain since taken off lasix  3mos ago (470> 588lb now) -restart IV lasix , volume status is difficult to assess, will ask Cards to see, may benefit from RHC  Acute hypoixic resp failure -multifactorial -PNA/CHF/OSA/OHS - Not on O2 at baseline - Continue CPAP nightly  Hypertensive urgency - Now improving - Continue hydralazine , carvedilol , diuretics as above  Paroxysmal A-fib - In sinus rhythm at this time, not on anticoagulation at baseline, he could not tell me why  Chronic lymphedema, chronic skin changes - Has wounds on the posterior aspect - Continue wound care, needs significant weight loss, lymphedema clinic referral - PT eval  Cocaine positive On admission  Morbid obesity BMI 79.7 -Needs follow-up in obesity clinic, eval for  GLP-1   DVT prophylaxis: Code Status:  Family Communication: Disposition Plan:   Consultants:    Procedures:   Antimicrobials:    Objective: Vitals:   02/11/24 0517 02/11/24 0553 02/11/24 0725 02/11/24 1034  BP: (!) 170/76 (!) 154/72 (!) 158/72 112/68  Pulse:  85 86 89  Resp:  18 (!) 21 16  Temp:   99.5 F (37.5 C) 98 F (36.7 C)  TempSrc:   Axillary Oral  SpO2:  100% 99% 91%  Weight:      Height:        Intake/Output Summary (Last 24 hours) at 02/11/2024 1108 Last data filed at 02/11/2024 0956 Gross per 24 hour  Intake 354 ml  Output 800 ml  Net -446 ml   Filed Weights   02/08/24 0900 02/09/24 1233 02/10/24 0411  Weight: (!) 256 kg (!) 258.2 kg (!) 266.7 kg    Examination:  General exam: Appears calm and comfortable  Respiratory system: Clear to auscultation Cardiovascular system: S1 & S2 heard, RRR.  Abd: nondistended, soft and nontender.Normal bowel sounds heard. Central nervous system: Alert and oriented. No focal neurological deficits. Extremities: no edema Skin: No rashes Psychiatry:  Mood & affect appropriate.     Data Reviewed:   CBC: Recent Labs  Lab 02/08/24 0953 02/08/24 1410 02/08/24 2212 02/09/24 1302 02/11/24 0359  WBC 9.0  --   --  9.2 9.7  NEUTROABS  --   --   --   --  6.6  HGB 10.5* 12.2* 12.2* 10.3* 10.0*  HCT 36.2* 36.0* 36.0* 35.7* 33.9*  MCV 80.1  --   --  79.7* 79.6*  PLT 251  --   --  292 312   Basic Metabolic Panel: Recent Labs  Lab 02/08/24 0953 02/08/24 1410 02/08/24 1803 02/08/24 2212 02/09/24 1302 02/10/24 1028 02/11/24 0359  NA 137 141  --  140 138 137 138  K 3.6 3.5  --  3.7 3.7 3.3* 4.0  CL 99  --   --   --  98 97* 98  CO2 31  --   --   --  32 34* 36*  GLUCOSE 98  --   --   --  88 114* 120*  BUN 13  --   --   --  17 16 15   CREATININE 1.39*  --   --   --  1.53* 1.30* 1.26*  CALCIUM 8.2*  --   --   --  8.3* 8.5* 8.8*  MG  --   --  2.0  --  2.0 2.0  --    GFR: Estimated Creatinine Clearance (by  C-G formula based on SCr of 1.26 mg/dL (H)) Male: 851.8 mL/min (A) Male: 177.3 mL/min (A) Liver Function Tests: Recent Labs  Lab 02/08/24 0953 02/09/24 1302  AST 20 17  ALT 11 11  ALKPHOS 78 76  BILITOT 0.4 0.5  PROT 7.2 7.4  ALBUMIN 3.4* 3.4*   No results for input(s): LIPASE, AMYLASE in the last 168 hours. No results for input(s): AMMONIA in the last 168 hours. Coagulation Profile: No results for input(s): INR, PROTIME in the last 168 hours. Cardiac Enzymes: No results for input(s): CKTOTAL, CKMB, CKMBINDEX, TROPONINI in the last 168 hours. BNP (last 3 results) Recent Labs    02/08/24 0953  PROBNP 239.0   HbA1C: Recent Labs    02/08/24 1803  HGBA1C 5.7*   CBG: Recent Labs  Lab 02/10/24 0533 02/10/24 1107 02/10/24 1606 02/10/24 2112 02/11/24 0549  GLUCAP 108* 106* 106* 124* 98   Lipid Profile: No results for input(s): CHOL, HDL, LDLCALC, TRIG, CHOLHDL, LDLDIRECT in the last 72 hours. Thyroid  Function Tests: No results for input(s): TSH, T4TOTAL, FREET4, T3FREE, THYROIDAB in the last 72 hours. Anemia Panel: Recent Labs    02/08/24 1803  VITAMINB12 583  FOLATE 13.1  FERRITIN 102  TIBC 377  IRON 21*  RETICCTPCT 2.2   Urine analysis:    Component Value Date/Time   COLORURINE YELLOW (A) 10/25/2023 2200   APPEARANCEUR HAZY (A) 10/25/2023 2200   LABSPEC 1.021 10/25/2023 2200   PHURINE 7.0 10/25/2023 2200   GLUCOSEU NEGATIVE 10/25/2023 2200   HGBUR NEGATIVE 10/25/2023 2200   BILIRUBINUR NEGATIVE 10/25/2023 2200   KETONESUR NEGATIVE 10/25/2023 2200   PROTEINUR 100 (A) 10/25/2023 2200   UROBILINOGEN 1.0 10/15/2008 1535   NITRITE NEGATIVE 10/25/2023 2200   LEUKOCYTESUR NEGATIVE 10/25/2023 2200   Sepsis Labs: @LABRCNTIP (procalcitonin:4,lacticidven:4)  ) Recent Results (from the past 240 hours)  MRSA Next Gen by PCR, Nasal     Status: None   Collection Time: 02/09/24  2:56 PM   Specimen: Nasal Mucosa; Nasal  Swab  Result Value Ref Range Status   MRSA by PCR Next Gen NOT DETECTED NOT DETECTED Final    Comment: (NOTE) The GeneXpert MRSA Assay (FDA approved for NASAL specimens only), is one component of a comprehensive MRSA colonization surveillance program. It is not intended to diagnose MRSA infection nor to guide or monitor treatment for MRSA infections. Test performance is not FDA approved in patients less than 74 years old. Performed at [facility], [address], [place], [place] [address]   Expectorated Sputum Assessment w Gram  Stain, Rflx to Resp Cult     Status: None   Collection Time: 02/10/24  2:35 PM   Specimen: Expectorated Sputum  Result Value Ref Range Status   Specimen Description EXPECTORATED SPUTUM  Final   Special Requests NONE  Final   Sputum evaluation   Final    THIS SPECIMEN IS ACCEPTABLE FOR SPUTUM CULTURE Performed at [facility], [address] [place], [place] [address]    Report Status 02/10/2024 FINAL  Final  Culture, Respiratory w Gram Stain     Status: None (Preliminary result)   Collection Time: 02/10/24  2:35 PM  Result Value Ref Range Status   Specimen Description EXPECTORATED SPUTUM  Final   Special Requests NONE Reflexed from [id number]  Final   Gram Stain   Final    MODERATE WBC PRESENT,BOTH PMN AND MONONUCLEAR FEW GRAM POSITIVE COCCI RARE GRAM NEGATIVE RODS    Culture   Final    CULTURE REINCUBATED FOR BETTER GROWTH Performed at [facility], [address], [place], [place] [address]    Report Status PENDING  Incomplete     Radiology Studies: No results found.   Scheduled Meds:  carvedilol   25 mg Oral BID WC   ferrous sulfate  325 mg Oral Q breakfast   furosemide   80 mg Intravenous BID   heparin   5,000 Units Subcutaneous Q8H   hydrALAZINE   25 mg Oral Q8H   nicotine   21 mg Transdermal Daily   potassium chloride   40 mEq Oral Once   sodium chloride  flush  3 mL Intravenous Q12H   thiamine   100 mg Oral Daily    Continuous Infusions:  piperacillin -tazobactam (ZOSYN )  IV 3.375 g (02/11/24 0517)     LOS: 3 days    Time spent:    Sigurd Pac, MD [facility]   02/11/2024, 11:08 AM    "

## 2024-02-11 NOTE — Progress Notes (Incomplete)
 "   Progress Note  Patient Name: Jared Mccoy Date of Encounter: 02/11/2024  [facility] Cardiologist: None   Patient Profile     Subjective   Diuresing well on Lasix  80mg  IV BID.  UOP yesterday and net neg since admit.   Spiro increased yesterday to 50mg  daily and started on Jardiance 10mg  daily.   Inpatient Medications    Scheduled Meds:  carvedilol   25 mg Oral BID WC   empagliflozin  10 mg Oral Daily   ferrous sulfate  325 mg Oral Q breakfast   furosemide   80 mg Intravenous BID   heparin   5,000 Units Subcutaneous Q8H   hydrALAZINE   25 mg Oral Q8H   nicotine   21 mg Transdermal Daily   sodium chloride  flush  3 mL Intravenous Q12H   spironolactone   50 mg Oral Daily   thiamine   100 mg Oral Daily   Continuous Infusions:  piperacillin -tazobactam (ZOSYN )  IV 3.375 g (02/11/24 1427)   PRN Meds: acetaminophen  **OR** acetaminophen , docusate sodium , guaiFENesin-dextromethorphan, hydrALAZINE , ipratropium-albuterol , oxyCODONE    Vital Signs    Vitals:   02/11/24 0800 02/11/24 1034 02/11/24 1605 02/11/24 1936  BP: (!) 145/73 112/68 128/78 130/75  Pulse: 80 89 74 79  Resp: (!) 21 16 18  (!) 21  Temp:  98 F (36.7 C) 97.8 F (36.6 C) 98.2 F (36.8 C)  TempSrc:  Oral Oral Oral  SpO2: 97% 91% 93% 96%  Weight:      Height:        Intake/Output Summary (Last 24 hours) at 02/11/2024 2132 Last data filed at 02/11/2024 1937 Gross per 24 hour  Intake 596 ml  Output 4025 ml  Net -3429 ml      02/10/2024    4:11 AM 02/09/2024   12:33 PM 02/08/2024    9:00 AM  Last 3 Weights  Weight (lbs) 588 lb 569 lb 3.6 oz 564 lb 6 oz  Weight (kg) 266.715 kg 258.2 kg 256 kg      Telemetry    NSR - Personally Reviewed  ECG    No new EKG to review - Personally Reviewed  Physical Exam   GEN: No acute distress.   Neck: No JVD Cardiac: RRR, no murmurs, rubs, or gallops.  Respiratory: Clear to auscultation bilaterally. GI: Soft, nontender, non-distended  MS: No edema;  No deformity. Neuro:  Nonfocal  Psych: Normal affect   Labs    High Sensitivity Troponin:  No results for input(s): TROPONINIHS in the last 720 hours.    Chemistry Recent Labs  Lab 02/08/24 0953 02/08/24 1410 02/09/24 1302 02/10/24 1028 02/11/24 0359  NA 137   < > 138 137 138  K 3.6   < > 3.7 3.3* 4.0  CL 99  --  98 97* 98  CO2 31  --  32 34* 36*  GLUCOSE 98  --  88 114* 120*  BUN 13  --  17 16 15   CREATININE 1.39*  --  1.53* 1.30* 1.26*  CALCIUM 8.2*  --  8.3* 8.5* 8.8*  PROT 7.2  --  7.4  --   --   ALBUMIN 3.4*  --  3.4*  --   --   AST 20  --  17  --   --   ALT 11  --  11  --   --   ALKPHOS 78  --  76  --   --   BILITOT 0.4  --  0.5  --   --  GFRNONAA >60  --  >60 >60 >60  ANIONGAP 7  --  8 6 4*   < > = values in this interval not displayed.     Hematology Recent Labs  Lab 02/08/24 0953 02/08/24 1410 02/08/24 1803 02/08/24 2212 02/09/24 1302 02/11/24 0359  WBC 9.0  --   --   --  9.2 9.7  RBC 4.52  --  4.80  --  4.48 4.26  HGB 10.5*   < >  --  12.2* 10.3* 10.0*  HCT 36.2*   < >  --  36.0* 35.7* 33.9*  MCV 80.1  --   --   --  79.7* 79.6*  MCH 23.2*  --   --   --  23.0* 23.5*  MCHC 29.0*  --   --   --  28.9* 29.5*  RDW 19.1*  --   --   --  19.2* 18.8*  PLT 251  --   --   --  292 312   < > = values in this interval not displayed.    BNP Recent Labs  Lab 02/08/24 0953  PROBNP 239.0     DDimer No results for input(s): DDIMER in the last 168 hours.   CHA2DS2-VASc Score = 1   This indicates a 0.6% annual risk of stroke. The patient's score is based upon: CHF History: 0 HTN History: 1 Diabetes History: 0 Stroke History: 0 Vascular Disease History: 0 Age Score: 0 Gender Score: 0       Radiology    No results found.  Patient Profile     33 y.o. adult with a hx of hypertension, obstructive sleep apnea on CPAP, morbid obesity chronic bilateral lymphedema, chronic cigarette use who is being seen 02/11/2024 for the evaluation of CHF and  dyspnea at the request of Paticia Pac, MD and question regarding possibility of right heart catheterization for volume determination.   Assessment & Plan    #Acute on Chronic Hypoxemic and Hypercarbic Respiratory Failure #Pickwickian Syndrome #Supra Morbid Obesity #CAP #Acute on Chronic Diastolic CHF #Chronic Bilateral Lymphedema #OSA on CPAP -not a candidate for RHC due to supra supra morbid obesity (BMI 80). -diuresing well on Lasix  80mg  IV BID:UOP ***L yesterday and net neg ***L since admit. -SCr ***, K+ ***, Mag *** -Continue Carvedilol  25mg  BID, Spiro 50mg  daily, Jardiance 50mg  daily, Hydralazine  25mg  TID -Rx of CAP with antbx per [facility] -follow daily weights, renal function and strict I&O's while diuresing -continue fluid restriction with 1500cc/24hr.      For questions or updates, please contact [facility] Please consult [email or web address] for contact info under        Signed, Wilbert Bihari, MD  02/11/2024, 9:32 PM   "

## 2024-02-11 NOTE — Consult Note (Signed)
 "   Cardiology Consultation   Patient ID: MARTIN BELLING MRN: [id number]; DOB: 22-Feb-1990  Admit date: 02/08/2024 Date of Consult: 02/11/2024  PCP:  Celestia Rosaline SQUIBB, NP   [facility] Providers Cardiologist:  None        Patient Profile:   ALCARIO TINKEY is a 33 y.o. adult with a hx of hypertension, obstructive sleep apnea on CPAP, morbid obesity chronic bilateral lymphedema, chronic cigarette use who is being seen 02/11/2024 for the evaluation of CHF and dyspnea at the request of Paticia Fairy, MD and question regarding possibility of right heart catheterization for volume determination.  History of Present Illness:   JALIEN WEAKLAND is a 33 y.o. African-American male patient who is presently disabled, has 3 children that he takes care of at home, was admitted to the hospital with gradually worsening dyspnea and worse over the past 3 days associated with cough, her sister who visited him called the EMS and upon arrival, he was found to be hypoxemic with saturations around 80% and was placed on nasal cannula oxygen and transferred to [facility] emergency room.  He was admitted for further management and evaluation of acute hypoxemic and hypercapnic respiratory failure, community-acquired pneumonia and cardiology is consulted for further recommendations.  Patient states that his breathing has improved, he is no bringing up phlegm which is clear-colored, still not back to his baseline.  Denies any fever or chills.  He was also found to have positive cocaine in his urine drug screen, he denies use of cocaine. Patient has orthopnea and this is chronic.  He also has chronic lymphedema of bilateral lower extremities.  Patient admits to having gained weight approximately about 100 pounds in the last several months.  Past Medical History:  Diagnosis Date   Hypertension    not on medication   Obese    Peripheral vascular disease    edema in legs     Past Surgical  History:  Procedure Laterality Date   MANDIBULAR HARDWARE REMOVAL N/A 12/27/2016   Procedure: MANDIBULAR HARDWARE REMOVAL;  Surgeon: Karis Clunes, MD;  Location: [facility] OR;  Service: ENT;  Laterality: N/A;   OPEN REDUCTION INTERNAL FIXATION (ORIF) DISTAL RADIAL FRACTURE Left 11/10/2016   Procedure: OPEN REDUCTION INTERNAL FIXATION (ORIF) DISTAL RADIAL FRACTURE;  Surgeon: Sebastian Lenis, MD;  Location: [facility] OR;  Service: Orthopedics;  Laterality: Left;   ORIF MANDIBULAR FRACTURE Right 11/10/2016   Procedure: OPEN REDUCTION INTERNAL FIXATION (ORIF) MANDIBULAR FRACTURE, Ear Laceration Repair;  Surgeon: Karis Clunes, MD;  Location: [facility] OR;  Service: ENT;  Laterality: Right;     Home Medications:  Prior to Admission medications  Medication Sig Start Date End Date Taking? Authorizing Provider  acetaminophen  (TYLENOL ) 500 MG tablet Take 1,000 mg by mouth 2 (two) times daily as needed for headache or fever (pain).   Yes [provider]  amLODipine  (NORVASC ) 10 MG tablet Take 1 tablet (10 mg total) by mouth daily. 11/28/23  Yes Vannie Reche RAMAN, NP  carvedilol  (COREG ) 25 MG tablet Take 1 tablet (25 mg total) by mouth 2 (two) times daily with a meal. 11/28/23  Yes Vannie Reche RAMAN, NP  hydrALAZINE  (APRESOLINE ) 100 MG tablet Take 1 tablet (100 mg total) by mouth every 8 (eight) hours. 11/28/23  Yes Vannie Reche RAMAN, NP  irbesartan  (AVAPRO ) 150 MG tablet Take 1 tablet (150 mg total) by mouth daily. 12/04/23  Yes Lonni Slain, MD  spironolactone  (ALDACTONE ) 25 MG tablet Take 1 tablet (25 mg total)  by mouth 2 (two) times daily. 12/01/23  Yes Walker, Caitlin S, NP   Inpatient Medications: Scheduled Meds:  carvedilol   25 mg Oral BID WC   ferrous sulfate  325 mg Oral Q breakfast   furosemide   80 mg Intravenous BID   heparin   5,000 Units Subcutaneous Q8H   hydrALAZINE   25 mg Oral Q8H   nicotine   21 mg Transdermal Daily   sodium chloride  flush  3 mL Intravenous Q12H   thiamine   100 mg Oral Daily    Continuous Infusions:  piperacillin -tazobactam (ZOSYN )  IV 3.375 g (02/11/24 1427)   PRN Meds: acetaminophen  **OR** acetaminophen , docusate sodium , guaiFENesin-dextromethorphan, hydrALAZINE , ipratropium-albuterol , oxyCODONE   Allergies:   Allergies[1]  Social History:   Social History   Tobacco Use   Smoking status: Every Day    Current packs/day: 0.50    Average packs/day: 0.5 packs/day for 20.0 years (10.0 ttl pk-yrs)    Types: Cigarettes   Smokeless tobacco: Never   Tobacco comments:    I pack of cigarettes a day. 07/09/2023  Substance Use Topics   Alcohol use: Yes    Comment: 12/26/16- 1 fifth a week    Family History:   Negative for premature CAD  ROS:  Review of Systems  Cardiovascular:  Positive for dyspnea on exertion and leg swelling (chronic). Negative for chest pain.    Physical Exam    Vitals:   02/11/24 0553 02/11/24 0725 02/11/24 0800 02/11/24 1034  BP: (!) 154/72 (!) 158/72 (!) 145/73 112/68  Pulse: 85 86 80 89  Resp: 18 (!) 21 (!) 21 16  Temp:  99.5 F (37.5 C)  98 F (36.7 C)  TempSrc:  Axillary  Oral  SpO2: 100% 99% 97% 91%  Weight:      Height:       Physical Exam Constitutional:      Comments: Morbidly obese in no acute distress.  Neck:     Vascular: No carotid bruit.     Comments: Unable to evaluate JVD due to short thick neck Cardiovascular:     Rate and Rhythm: Normal rate and regular rhythm.     Heart sounds: Normal heart sounds. No murmur heard.    No gallop.     Comments: Unable to feel pedal pulses due to lymphedema Pulmonary:     Effort: Pulmonary effort is normal.     Breath sounds: Wheezing (bilateral extensive at the bases) and rhonchi (at the bases) present.  Abdominal:     General: Bowel sounds are normal.     Palpations: Abdomen is soft.     Comments: Obese. Pannus present  Musculoskeletal:        General: Swelling (Bilateral lower extremity Peu de Orangeskin thickening and nonpitting edema.) present.  Skin:     Capillary Refill: Capillary refill takes less than 2 seconds.      Intake/Output Summary (Last 24 hours) at 02/11/2024 1501 Last data filed at 02/11/2024 1430 Gross per 24 hour  Intake 596 ml  Output 2075 ml  Net -1479 ml      02/10/2024    4:11 AM 02/09/2024   12:33 PM 02/08/2024    9:00 AM  Last 3 Weights  Weight (lbs) 588 lb 569 lb 3.6 oz 564 lb 6 oz  Weight (kg) 266.715 kg 258.2 kg 256 kg     Net IO Since Admission: -2,908.54 mL [02/11/24 1501]  Labs   Lab Results  Component Value Date   NA 138 02/11/2024   K 4.0 02/11/2024  CO2 36 (H) 02/11/2024   GLUCOSE 120 (H) 02/11/2024   BUN 15 02/11/2024   CREATININE 1.26 (H) 02/11/2024   CALCIUM 8.8 (L) 02/11/2024   EGFR 70 09/11/2023   GFRNONAA >60 02/11/2024       Latest Ref Rng & Units 02/11/2024    3:59 AM 02/10/2024   10:28 AM 02/09/2024    1:02 PM  BMP  Glucose 70 - 99 mg/dL 879  885  88   BUN 6 - 20 mg/dL 15  16  17    Creatinine 0.61 - 1.24 mg/dL 8.73  8.69  8.46   Sodium 135 - 145 mmol/L 138  137  138   Potassium 3.5 - 5.1 mmol/L 4.0  3.3  3.7   Chloride 98 - 111 mmol/L 98  97  98   CO2 22 - 32 mmol/L 36  34  32   Calcium 8.9 - 10.3 mg/dL 8.8  8.5  8.3        Latest Ref Rng & Units 02/11/2024    3:59 AM 02/09/2024    1:02 PM 02/08/2024   10:12 PM  CBC  WBC 4.0 - 10.5 K/uL 9.7  9.2    Hemoglobin 13.0 - 17.0 g/dL 89.9  89.6  87.7   Hematocrit 39.0 - 52.0 % 33.9  35.7  36.0   Platelets 150 - 400 K/uL 312  292      Lab Results  Component Value Date   CHOL 164 07/12/2023   HDL 39 (L) 07/12/2023   LDLCALC 112 (H) 07/12/2023   TRIG 65 07/12/2023   CHOLHDL 4.2 07/12/2023    Lab Results  Component Value Date   TSH 1.715 09/03/2023    Lab Results  Component Value Date   HGBA1C 5.7 (H) 02/08/2024   BNP (last 3 results) Recent Labs    06/29/23 0154 07/12/23 0816 09/03/23 0104  BNP 37.7 43.7 29.4    ProBNP (last 3 results) Recent Labs    02/08/24 0953  PROBNP 239.0    DDimer  No results for input(s): DDIMER in the last 168 hours.  Tele/EKG/Cardiac studies    EKG:  EKG 02/08/2024: Normal sinus rhythm at rate of 77 bpm, right axis deviation.  Compared to 10/26/2023, nonspecific lateral ST-T wave changes and prolonged QT not present.   ECHOCARDIOGRAM COMPLETE 10/27/2023  1. Left ventricular ejection fraction, by estimation, is 65 to 70%. The left ventricle has normal function. The left ventricle has no regional wall motion abnormalities. The left ventricular internal cavity size was mildly dilated. There is moderate left ventricular hypertrophy. Left ventricular diastolic parameters are indeterminate. 2. Right ventricular systolic function is normal. The right ventricular size is normal. 3. Left atrial size was moderately dilated. 4. Right atrial size was mildly dilated. 5.  Aortic root 4 cm, no significant valvular abnormality.  Radiology  CT angiogram chest 02/08/2024: 1. No central or segmental pulmonary embolus. Limited evaluation at the subsegmental level due to contrast timing. 2. Multifocal pneumonia of the right lower and upper lobes. Recommend follow-up CT in 3 months to evaluate for complete resolution. 3. Right hilar lymphadenopathy that may be reactive in etiology - recommend attention on follow-up. 4. Enlarged main pulmonary artery - correlate for pulmonary hypertension.  Assessment & Plan .     1.  Acute on chronic hypoxemic and hypercarbic respiratory failure due to obesity and pickwickian syndrome, exacerbated by community-acquired pneumonia and acute on chronic diastolic heart failure. 2.  Primary hypertension 3.  OSA on CPAP and compliant  4.  Substance use disorder including cocaine positive although patient denies cocaine use, smokes about a pack of cigarettes a day.  Recommendations: He would not be a candidate for right heart catheterization in view of his morbid obesity and his weight exceeding 5 pounds.  Also in the present situation  with acute illness including multilobar pneumonia and productive sputum, extensive bilateral wheezing, fluid overload state, his pressures would probably be indeterminate.  Agree with diuresis with furosemide  and restarting home medication, spironolactone  but increased from 25 to 50 mg daily to his present medical regimen and also add Jardiance.  Agree with continuing with carvedilol .  BMP/proBNP can be normal in morbidly obese patients with fluid overload state.  I would use BMP and BUN to creatinine ratio as a guide for diuresis. Will fluid restrict to 1500 mL/24 hours. If stable resume home dose ARB.    Gordy Bergamo, MD, [facility] 02/11/2024, 3:01 PM [facility] [address] [place], [place] [address] Phone: [phone]. Fax:  [phone]      [1]  Allergies Allergen Reactions   Crab (Diagnostic) Diarrhea and Nausea And Vomiting    No issues with other shellfish - ONLY crab legs   "

## 2024-02-12 DIAGNOSIS — R0602 Shortness of breath: Secondary | ICD-10-CM | POA: Diagnosis not present

## 2024-02-12 DIAGNOSIS — I10 Essential (primary) hypertension: Secondary | ICD-10-CM | POA: Diagnosis not present

## 2024-02-12 DIAGNOSIS — I5031 Acute diastolic (congestive) heart failure: Secondary | ICD-10-CM | POA: Diagnosis not present

## 2024-02-12 DIAGNOSIS — R600 Localized edema: Secondary | ICD-10-CM | POA: Diagnosis not present

## 2024-02-12 DIAGNOSIS — I890 Lymphedema, not elsewhere classified: Secondary | ICD-10-CM | POA: Diagnosis not present

## 2024-02-12 DIAGNOSIS — J9622 Acute and chronic respiratory failure with hypercapnia: Secondary | ICD-10-CM | POA: Diagnosis not present

## 2024-02-12 DIAGNOSIS — I5033 Acute on chronic diastolic (congestive) heart failure: Secondary | ICD-10-CM | POA: Diagnosis not present

## 2024-02-12 DIAGNOSIS — J9621 Acute and chronic respiratory failure with hypoxia: Secondary | ICD-10-CM | POA: Diagnosis not present

## 2024-02-12 LAB — CBC
HCT: 33.2 % — ABNORMAL LOW (ref 39.0–52.0)
Hemoglobin: 10 g/dL — ABNORMAL LOW (ref 13.0–17.0)
MCH: 23.3 pg — ABNORMAL LOW (ref 26.0–34.0)
MCHC: 30.1 g/dL (ref 30.0–36.0)
MCV: 77.2 fL — ABNORMAL LOW (ref 80.0–100.0)
Platelets: 291 K/uL (ref 150–400)
RBC: 4.3 MIL/uL (ref 4.22–5.81)
RDW: 18.6 % — ABNORMAL HIGH (ref 11.5–15.5)
WBC: 7.2 K/uL (ref 4.0–10.5)
nRBC: 0 % (ref 0.0–0.2)

## 2024-02-12 LAB — GLUCOSE, CAPILLARY
Glucose-Capillary: 98 mg/dL (ref 70–99)
Glucose-Capillary: 90 mg/dL (ref 70–99)
Glucose-Capillary: 99 mg/dL (ref 70–99)
Glucose-Capillary: 103 mg/dL — ABNORMAL HIGH (ref 70–99)

## 2024-02-12 LAB — BASIC METABOLIC PANEL WITH GFR
Anion gap: 5 (ref 5–15)
BUN: 17 mg/dL (ref 6–20)
CO2: 38 mmol/L — ABNORMAL HIGH (ref 22–32)
Calcium: 8.8 mg/dL — ABNORMAL LOW (ref 8.9–10.3)
Chloride: 97 mmol/L — ABNORMAL LOW (ref 98–111)
Creatinine, Ser: 1.54 mg/dL — ABNORMAL HIGH (ref 0.61–1.24)
GFR, Estimated: 60 mL/min — ABNORMAL LOW
Glucose, Bld: 92 mg/dL (ref 70–99)
Potassium: 3.6 mmol/L (ref 3.5–5.1)
Sodium: 140 mmol/L (ref 135–145)

## 2024-02-12 MED ORDER — HYDRALAZINE HCL 10 MG PO TABS
10.0000 mg | ORAL_TABLET | Freq: Three times a day (TID) | ORAL | Status: DC
  Administered 2024-02-12 – 2024-02-13 (×3): 10 mg via ORAL
  Filled 2024-02-12 (×3): qty 1

## 2024-02-12 NOTE — Progress Notes (Signed)
 " PROGRESS NOTE    Jared Mccoy  FMW:[id number] DOB: [birth year] DOA: 02/08/2024 PCP: Celestia Rosaline SQUIBB, NP  33/M with morbid obesity, OSA, paroxysmal A-fib not on anticoagulation, chronic edema, concern for lymphedema presented to the ED with shortness of breath and hypoxia.  In the ED no fever or leukocytosis, CT a chest with possible multifocal pneumonia and adenopathy, enlarged main pulmonary artery, correlate for pulmonary hypertension - Admitted, started on antibiotics for multifocal pneumonia - 12/31, no improvement with antibiotics, starting diuretics   Subjective: - Feeling better, urinated more yesterday  Assessment and Plan:  Pneumonia - Did have a productive cough and concern for multifocal pneumonia on imaging, - Sputum culture is reintubated-noted few gram-positive cocci and rare gram-negative rods - DC ABX, completes 5-day course today - Respiratory panel by RT-PCR got canceled for some reason, low yield at this point now without active symptoms  Acute on chronic D CHF, ? RV failure Pulmonary hypertension -ECHO-9/25 w/ EF 65-70, ? Normal RV, indeterminate Diastolic fxn -pt reports 100lb weight gain since taken off lasix  3mos ago (470> 588lb now) - Diuresing well, 8 L negative volume status is difficult to assess, not appropriate for right heart cath with morbid obesity -Continue current dose of Lasix , Aldactone  - Appreciate cards input, started on Jardiance yesterday  Acute hypoixic resp failure -multifactorial -PNA/CHF/OSA/OHS - Not on O2 at baseline - Continue CPAP nightly, his home machine reportedly broke, TOC consulted  Hypertensive urgency - Now improving - Continue hydralazine , carvedilol , diuretics as above  Paroxysmal A-fib - In sinus rhythm at this time, not on anticoagulation at baseline, he could not tell me why  Chronic lymphedema, chronic skin changes - Has wounds on the posterior aspect - Continue wound care, needs significant weight loss,  lymphedema clinic referral - PT eval  Cocaine positive On admission  Morbid obesity BMI 79.7 -Needs follow-up in obesity clinic, eval for GLP-1   DVT prophylaxis: Heparin  subcutaneous Code Status: Full code Family Communication: None none present Disposition Plan: Home pending improvement in volume status  Consultants:    Procedures:   Antimicrobials:    Objective: Vitals:   02/11/24 2348 02/12/24 0025 02/12/24 0604 02/12/24 0800  BP: (!) 164/90  (!) 157/82 (!) 146/98  Pulse:  85 72 83  Resp:   20 14  Temp:   98.4 F (36.9 C) 98.4 F (36.9 C)  TempSrc:   Axillary Oral  SpO2:  94% 98% 94%  Weight:   (!) 264.9 kg   Height:        Intake/Output Summary (Last 24 hours) at 02/12/2024 1055 Last data filed at 02/12/2024 1000 Gross per 24 hour  Intake 600 ml  Output 7225 ml  Net -6625 ml   Filed Weights   02/09/24 1233 02/10/24 0411 02/12/24 0604  Weight: (!) 258.2 kg (!) 266.7 kg (!) 264.9 kg    Examination:  General exam: Appears calm and comfortable, morbidly obese, AAO x 3 Respiratory system: Distant breath sounds Cardiovascular system: S1 & S2 heard, RRR.  Abd: nondistended, soft and nontender.Normal bowel sounds heard. Central nervous system: Alert and oriented. No focal neurological deficits. Extremities: Chronic skin changes, lymphedema Skin: No rashes Psychiatry:  Mood & affect appropriate.     Data Reviewed:   CBC: Recent Labs  Lab 02/08/24 0953 02/08/24 1410 02/08/24 2212 02/09/24 1302 02/11/24 0359 02/12/24 0428  WBC 9.0  --   --  9.2 9.7 7.2  NEUTROABS  --   --   --   --  6.6  --   HGB 10.5* 12.2* 12.2* 10.3* 10.0* 10.0*  HCT 36.2* 36.0* 36.0* 35.7* 33.9* 33.2*  MCV 80.1  --   --  79.7* 79.6* 77.2*  PLT 251  --   --  292 312 291   Basic Metabolic Panel: Recent Labs  Lab 02/08/24 0953 02/08/24 1410 02/08/24 1803 02/08/24 2212 02/09/24 1302 02/10/24 1028 02/11/24 0359 02/12/24 0428  NA 137   < >  --  140 138 137 138 140  K  3.6   < >  --  3.7 3.7 3.3* 4.0 3.6  CL 99  --   --   --  98 97* 98 97*  CO2 31  --   --   --  32 34* 36* 38*  GLUCOSE 98  --   --   --  88 114* 120* 92  BUN 13  --   --   --  17 16 15 17   CREATININE 1.39*  --   --   --  1.53* 1.30* 1.26* 1.54*  CALCIUM 8.2*  --   --   --  8.3* 8.5* 8.8* 8.8*  MG  --   --  2.0  --  2.0 2.0  --   --    < > = values in this interval not displayed.   GFR: Estimated Creatinine Clearance (by C-G formula based on SCr of 1.54 mg/dL (H)) Male: 879.3 mL/min (A) Male: 144.4 mL/min (A) Liver Function Tests: Recent Labs  Lab 02/08/24 0953 02/09/24 1302  AST 20 17  ALT 11 11  ALKPHOS 78 76  BILITOT 0.4 0.5  PROT 7.2 7.4  ALBUMIN 3.4* 3.4*   No results for input(s): LIPASE, AMYLASE in the last 168 hours. No results for input(s): AMMONIA in the last 168 hours. Coagulation Profile: No results for input(s): INR, PROTIME in the last 168 hours. Cardiac Enzymes: No results for input(s): CKTOTAL, CKMB, CKMBINDEX, TROPONINI in the last 168 hours. BNP (last 3 results) Recent Labs    02/08/24 0953  PROBNP 239.0   HbA1C: No results for input(s): HGBA1C in the last 72 hours.  CBG: Recent Labs  Lab 02/11/24 0549 02/11/24 1113 02/11/24 1602 02/11/24 2111 02/12/24 0558  GLUCAP 98 115* 95 99 98   Lipid Profile: No results for input(s): CHOL, HDL, LDLCALC, TRIG, CHOLHDL, LDLDIRECT in the last 72 hours. Thyroid  Function Tests: No results for input(s): TSH, T4TOTAL, FREET4, T3FREE, THYROIDAB in the last 72 hours. Anemia Panel: No results for input(s): VITAMINB12, FOLATE, FERRITIN, TIBC, IRON, RETICCTPCT in the last 72 hours.  Urine analysis:    Component Value Date/Time   COLORURINE YELLOW (A) 10/25/2023 2200   APPEARANCEUR HAZY (A) 10/25/2023 2200   LABSPEC 1.021 10/25/2023 2200   PHURINE 7.0 10/25/2023 2200   GLUCOSEU NEGATIVE 10/25/2023 2200   HGBUR NEGATIVE 10/25/2023 2200   BILIRUBINUR  NEGATIVE 10/25/2023 2200   KETONESUR NEGATIVE 10/25/2023 2200   PROTEINUR 100 (A) 10/25/2023 2200   UROBILINOGEN 1.0 10/15/2008 1535   NITRITE NEGATIVE 10/25/2023 2200   LEUKOCYTESUR NEGATIVE 10/25/2023 2200   Sepsis Labs: @LABRCNTIP (procalcitonin:4,lacticidven:4)  ) Recent Results (from the past 240 hours)  MRSA Next Gen by PCR, Nasal     Status: None   Collection Time: 02/09/24  2:56 PM   Specimen: Nasal Mucosa; Nasal Swab  Result Value Ref Range Status   MRSA by PCR Next Gen NOT DETECTED NOT DETECTED Final    Comment: (NOTE) The GeneXpert MRSA Assay (FDA approved for NASAL specimens only),  is one component of a comprehensive MRSA colonization surveillance program. It is not intended to diagnose MRSA infection nor to guide or monitor treatment for MRSA infections. Test performance is not FDA approved in patients less than 72 years old. Performed at [facility], [address], [place], [place] [address]   Expectorated Sputum Assessment w Gram Stain, Rflx to Resp Cult     Status: None   Collection Time: 02/10/24  2:35 PM   Specimen: Expectorated Sputum  Result Value Ref Range Status   Specimen Description EXPECTORATED SPUTUM  Final   Special Requests NONE  Final   Sputum evaluation   Final    THIS SPECIMEN IS ACCEPTABLE FOR SPUTUM CULTURE Performed at [facility], [address] [place], [place] [address]    Report Status 02/10/2024 FINAL  Final  Culture, Respiratory w Gram Stain     Status: None (Preliminary result)   Collection Time: 02/10/24  2:35 PM  Result Value Ref Range Status   Specimen Description EXPECTORATED SPUTUM  Final   Special Requests NONE Reflexed from [id number]  Final   Gram Stain   Final    MODERATE WBC PRESENT,BOTH PMN AND MONONUCLEAR FEW GRAM POSITIVE COCCI RARE GRAM NEGATIVE RODS    Culture   Final    CULTURE REINCUBATED FOR BETTER GROWTH Performed at [facility], [address], [place], [place] [address]    Report Status  PENDING  Incomplete     Radiology Studies: No results found.   Scheduled Meds:  carvedilol   25 mg Oral BID WC   empagliflozin  10 mg Oral Daily   ferrous sulfate  325 mg Oral Q breakfast   furosemide   80 mg Intravenous BID   heparin   5,000 Units Subcutaneous Q8H   hydrALAZINE   25 mg Oral Q8H   nicotine   21 mg Transdermal Daily   sodium chloride  flush  3 mL Intravenous Q12H   spironolactone   50 mg Oral Daily   thiamine   100 mg Oral Daily   Continuous Infusions:  piperacillin -tazobactam (ZOSYN )  IV 3.375 g (02/12/24 0631)     LOS: 4 days    Time spent:    Sigurd Pac, MD [facility]   02/12/2024, 10:55 AM    "

## 2024-02-13 DIAGNOSIS — R600 Localized edema: Secondary | ICD-10-CM | POA: Diagnosis not present

## 2024-02-13 DIAGNOSIS — I5031 Acute diastolic (congestive) heart failure: Secondary | ICD-10-CM | POA: Diagnosis not present

## 2024-02-13 DIAGNOSIS — R0602 Shortness of breath: Secondary | ICD-10-CM | POA: Diagnosis not present

## 2024-02-13 LAB — BASIC METABOLIC PANEL WITH GFR
Anion gap: 7 (ref 5–15)
BUN: 21 mg/dL — ABNORMAL HIGH (ref 6–20)
CO2: 37 mmol/L — ABNORMAL HIGH (ref 22–32)
Calcium: 9 mg/dL (ref 8.9–10.3)
Chloride: 97 mmol/L — ABNORMAL LOW (ref 98–111)
Creatinine, Ser: 1.71 mg/dL — ABNORMAL HIGH (ref 0.61–1.24)
GFR, Estimated: 53 mL/min — ABNORMAL LOW
Glucose, Bld: 86 mg/dL (ref 70–99)
Potassium: 3.5 mmol/L (ref 3.5–5.1)
Sodium: 140 mmol/L (ref 135–145)

## 2024-02-13 LAB — GLUCOSE, CAPILLARY
Glucose-Capillary: 96 mg/dL (ref 70–99)
Glucose-Capillary: 100 mg/dL — ABNORMAL HIGH (ref 70–99)

## 2024-02-13 LAB — CULTURE, RESPIRATORY W GRAM STAIN: Culture: NORMAL

## 2024-02-13 MED ORDER — ALBUMIN HUMAN 25 % IV SOLN
25.0000 g | Freq: Once | INTRAVENOUS | Status: AC
  Administered 2024-02-13: 25 g via INTRAVENOUS
  Filled 2024-02-13: qty 100

## 2024-02-13 MED ORDER — HYDRALAZINE HCL 50 MG PO TABS
50.0000 mg | ORAL_TABLET | Freq: Three times a day (TID) | ORAL | Status: DC
  Administered 2024-02-13 – 2024-02-14 (×3): 50 mg via ORAL
  Filled 2024-02-13 (×3): qty 1

## 2024-02-13 MED ORDER — HYDRALAZINE HCL 20 MG/ML IJ SOLN
10.0000 mg | INTRAMUSCULAR | Status: AC
  Administered 2024-02-13: 10 mg via INTRAVENOUS
  Filled 2024-02-13: qty 1

## 2024-02-13 MED ORDER — HYDRALAZINE HCL 20 MG/ML IJ SOLN: 10.0000 mg | Freq: Once | INTRAMUSCULAR | Status: DC

## 2024-02-13 NOTE — Progress Notes (Signed)
 PT Cancellation Note  Patient Details Name: Jared Mccoy MRN: [id number] DOB: March 07, 1990   Cancelled Treatment:    Reason Eval/Treat Not Completed: Other (comment) (Met Ortho Tech in hallway on their way to placed [place]ung[place]. Plan to follow up when able.)  Leontine Hilt DPT Acute Rehab Services [phone] Prefer contact via chat   Leontine KATHEE Hilt 02/13/2024, 4:17 PM

## 2024-02-13 NOTE — Progress Notes (Signed)
 " PROGRESS NOTE    Jared Mccoy  FMW:[id number] DOB: 12-Jun-1990 DOA: 02/08/2024 PCP: Celestia Rosaline SQUIBB, NP  34/M with morbid obesity, OSA, paroxysmal A-fib not on anticoagulation, chronic edema, concern for lymphedema presented to the ED with shortness of breath and hypoxia.  In the ED no fever or leukocytosis, CT a chest with possible multifocal pneumonia and adenopathy, enlarged main pulmonary artery, correlate for pulmonary hypertension - Admitted, started on antibiotics for multifocal pneumonia - 12/31, no improvement with antibiotics, starting diuretics   Subjective: - Feeling better, urinated more yesterday  Assessment and Plan:  Acute on chronic D CHF, ? RV failure Pulmonary hypertension -ECHO-9/25 w/ EF 65-70, ? Normal RV, indeterminate Diastolic fxn -pt reports 100lb weight gain since taken off lasix  3mos ago (470> 588lb now) - Diuresing well, 12.2 L negative volume status is difficult to assess, not appropriate for right heart cath with morbid obesity -Continue current dose of Lasix , Aldactone  despite mild uptrend in creatinine remains significantly volume overloaded - Appreciate cards input, started on Jardiance 12/31 - Add Unna boots  Acute hypoixic resp failure -multifactorial -PNA/CHF/OSA/OHS - Not on O2 at baseline - Continue CPAP nightly, his home machine reportedly broke, TOC consulted  Pneumonia - Did have a productive cough and concern for multifocal pneumonia on imaging, - Sputum culture is reintubated-noted few gram-positive cocci and rare gram-negative rods - Completed 5 days of ABX, antibiotics discontinued - Respiratory panel by RT-PCR got canceled for some reason, low yield at this point now without active symptoms  Hypertensive urgency - Now improving - Continue hydralazine , carvedilol , diuretics as above  Paroxysmal A-fib - In sinus rhythm at this time, not on anticoagulation at baseline, he could not tell me why  Chronic lymphedema, chronic  skin changes - Has wounds on the posterior aspect - Continue wound care, needs significant weight loss, lymphedema clinic referral - PT eval  Cocaine positive On admission  Morbid obesity BMI 79.7 -Needs follow-up in obesity clinic, eval for GLP-1   DVT prophylaxis: Heparin  subcutaneous Code Status: Full code Family Communication: None none present Disposition Plan: Home pending improvement in volume status  Consultants:    Procedures:   Antimicrobials:    Objective: Vitals:   02/13/24 0500 02/13/24 0605 02/13/24 0607 02/13/24 0805  BP:  (!) 150/86 (!) 150/86 (!) 156/90  Pulse:  81  86  Resp:    15  Temp:    97.7 F (36.5 C)  TempSrc:  Oral  Oral  SpO2:    95%  Weight: (!) 260.5 kg     Height:        Intake/Output Summary (Last 24 hours) at 02/13/2024 1225 Last data filed at 02/13/2024 1205 Gross per 24 hour  Intake 476 ml  Output 5900 ml  Net -5424 ml   Filed Weights   02/10/24 0411 02/12/24 0604 02/13/24 0500  Weight: (!) 266.7 kg (!) 264.9 kg (!) 260.5 kg    Examination:  General exam: Appears calm and comfortable, morbidly obese, AAO x 3 Respiratory system: Distant breath sounds Cardiovascular system: S1 & S2 heard, RRR.  Abd: nondistended, soft and nontender.Normal bowel sounds heard. Central nervous system: Alert and oriented. No focal neurological deficits. Extremities: Chronic skin changes, lymphedema Skin: No rashes Psychiatry:  Mood & affect appropriate.     Data Reviewed:   CBC: Recent Labs  Lab 02/08/24 0953 02/08/24 1410 02/08/24 2212 02/09/24 1302 02/11/24 0359 02/12/24 0428  WBC 9.0  --   --  9.2 9.7 7.2  NEUTROABS  --   --   --   --  6.6  --   HGB 10.5* 12.2* 12.2* 10.3* 10.0* 10.0*  HCT 36.2* 36.0* 36.0* 35.7* 33.9* 33.2*  MCV 80.1  --   --  79.7* 79.6* 77.2*  PLT 251  --   --  292 312 291   Basic Metabolic Panel: Recent Labs  Lab 02/08/24 1803 02/08/24 2212 02/09/24 1302 02/10/24 1028 02/11/24 0359 02/12/24 0428  02/13/24 0221  NA  --    < > 138 137 138 140 140  K  --    < > 3.7 3.3* 4.0 3.6 3.5  CL  --   --  98 97* 98 97* 97*  CO2  --   --  32 34* 36* 38* 37*  GLUCOSE  --   --  88 114* 120* 92 86  BUN  --   --  17 16 15 17  21*  CREATININE  --   --  1.53* 1.30* 1.26* 1.54* 1.71*  CALCIUM  --   --  8.3* 8.5* 8.8* 8.8* 9.0  MG 2.0  --  2.0 2.0  --   --   --    < > = values in this interval not displayed.   GFR: Estimated Creatinine Clearance (by C-G formula based on SCr of 1.71 mg/dL (H)) Male: 892.5 mL/min (A) Male: 128.6 mL/min (A) Liver Function Tests: Recent Labs  Lab 02/08/24 0953 02/09/24 1302  AST 20 17  ALT 11 11  ALKPHOS 78 76  BILITOT 0.4 0.5  PROT 7.2 7.4  ALBUMIN 3.4* 3.4*   No results for input(s): LIPASE, AMYLASE in the last 168 hours. No results for input(s): AMMONIA in the last 168 hours. Coagulation Profile: No results for input(s): INR, PROTIME in the last 168 hours. Cardiac Enzymes: No results for input(s): CKTOTAL, CKMB, CKMBINDEX, TROPONINI in the last 168 hours. BNP (last 3 results) Recent Labs    02/08/24 0953  PROBNP 239.0   HbA1C: No results for input(s): HGBA1C in the last 72 hours.  CBG: Recent Labs  Lab 02/12/24 1202 02/12/24 1703 02/12/24 2130 02/13/24 0622 02/13/24 1201  GLUCAP 90 99 103* 96 100*   Lipid Profile: No results for input(s): CHOL, HDL, LDLCALC, TRIG, CHOLHDL, LDLDIRECT in the last 72 hours. Thyroid  Function Tests: No results for input(s): TSH, T4TOTAL, FREET4, T3FREE, THYROIDAB in the last 72 hours. Anemia Panel: No results for input(s): VITAMINB12, FOLATE, FERRITIN, TIBC, IRON, RETICCTPCT in the last 72 hours.  Urine analysis:    Component Value Date/Time   COLORURINE YELLOW (A) 10/25/2023 2200   APPEARANCEUR HAZY (A) 10/25/2023 2200   LABSPEC 1.021 10/25/2023 2200   PHURINE 7.0 10/25/2023 2200   GLUCOSEU NEGATIVE 10/25/2023 2200   HGBUR NEGATIVE 10/25/2023  2200   BILIRUBINUR NEGATIVE 10/25/2023 2200   KETONESUR NEGATIVE 10/25/2023 2200   PROTEINUR 100 (A) 10/25/2023 2200   UROBILINOGEN 1.0 10/15/2008 1535   NITRITE NEGATIVE 10/25/2023 2200   LEUKOCYTESUR NEGATIVE 10/25/2023 2200   Sepsis Labs: @LABRCNTIP (procalcitonin:4,lacticidven:4)  ) Recent Results (from the past 240 hours)  MRSA Next Gen by PCR, Nasal     Status: None   Collection Time: 02/09/24  2:56 PM   Specimen: Nasal Mucosa; Nasal Swab  Result Value Ref Range Status   MRSA by PCR Next Gen NOT DETECTED NOT DETECTED Final    Comment: (NOTE) The GeneXpert MRSA Assay (FDA approved for NASAL specimens only), is one component of a comprehensive MRSA colonization surveillance program. It is not intended to diagnose MRSA infection nor to guide or monitor treatment for  MRSA infections. Test performance is not FDA approved in patients less than 39 years old. Performed at [facility], [address], [place], [place] [address]   Expectorated Sputum Assessment w Gram Stain, Rflx to Resp Cult     Status: None   Collection Time: 02/10/24  2:35 PM   Specimen: Expectorated Sputum  Result Value Ref Range Status   Specimen Description EXPECTORATED SPUTUM  Final   Special Requests NONE  Final   Sputum evaluation   Final    THIS SPECIMEN IS ACCEPTABLE FOR SPUTUM CULTURE Performed at [facility], [address] [place], [place] [address]    Report Status 02/10/2024 FINAL  Final  Culture, Respiratory w Gram Stain     Status: None   Collection Time: 02/10/24  2:35 PM  Result Value Ref Range Status   Specimen Description EXPECTORATED SPUTUM  Final   Special Requests NONE Reflexed from [id number]  Final   Gram Stain   Final    MODERATE WBC PRESENT,BOTH PMN AND MONONUCLEAR FEW GRAM POSITIVE COCCI RARE GRAM NEGATIVE RODS    Culture   Final    FEW Normal respiratory flora-no Staph aureus or Pseudomonas seen Performed at [facility], [address], [place], [place]  [address]    Report Status 02/13/2024 FINAL  Final     Radiology Studies: No results found.   Scheduled Meds:  carvedilol   25 mg Oral BID WC   empagliflozin  10 mg Oral Daily   ferrous sulfate  325 mg Oral Q breakfast   furosemide   80 mg Intravenous BID   heparin   5,000 Units Subcutaneous Q8H   hydrALAZINE   50 mg Oral Q8H   nicotine   21 mg Transdermal Daily   sodium chloride  flush  3 mL Intravenous Q12H   spironolactone   50 mg Oral Daily   thiamine   100 mg Oral Daily   Continuous Infusions:  albumin human       LOS: 5 days    Time spent:    Sigurd Pac, MD [facility]   02/13/2024, 12:25 PM    "

## 2024-02-13 NOTE — Plan of Care (Signed)
" °  Problem: Coping: Goal: Ability to adjust to condition or change in health will improve Outcome: Progressing   Problem: Fluid Volume: Goal: Ability to maintain a balanced intake and output will improve Outcome: Progressing   Problem: Health Behavior/Discharge Planning: Goal: Ability to manage health-related needs will improve Outcome: Progressing   Problem: Health Behavior/Discharge Planning: Goal: Ability to manage health-related needs will improve Outcome: Progressing   Problem: Clinical Measurements: Goal: Respiratory complications will improve Outcome: Progressing   "

## 2024-02-13 NOTE — TOC Progression Note (Signed)
 Transition of Care [facility]) - Progression Note    Patient Details  Name: ANATOLE APOLLO MRN: [id number] Date of Birth: 16-Apr-1990  Transition of Care [facility]) CM/SW Contact  Roxie KANDICE Stain, RN Phone Number: 02/13/2024, 2:44 PM  Clinical Narrative:      This RNCM was notified by patient that his home cpap from [employer] is broken. This RNCM reached out to [facility] with [employer] and notified that cpap is broken. Expected Discharge Plan: Home/Self Care Barriers to Discharge: Continued Medical Work up               Expected Discharge Plan and Services In-house Referral: NA Discharge Planning Services: NA Post Acute Care Choice: NA Living arrangements for the past 2 months: Single Family Home                                       Social Drivers of Health (SDOH) Interventions SDOH Screenings   Food Insecurity: No Food Insecurity (02/09/2024)  Housing: High Risk (02/09/2024)  Transportation Needs: No Transportation Needs (02/09/2024)  Utilities: Not At Risk (02/09/2024)  Depression (PHQ2-9): Low Risk (01/13/2024)  Financial Resource Strain: Medium Risk (11/07/2023)  Social Connections: Socially Isolated (02/09/2024)  Tobacco Use: High Risk (02/10/2024)    Readmission Risk Interventions    10/29/2023   12:27 PM  Readmission Risk Prevention Plan  PCP or Specialist Appt within 3-5 Days Complete  HRI or Home Care Consult Complete  Social Work Consult for Recovery Care Planning/Counseling Complete  Palliative Care Screening Not Applicable

## 2024-02-13 NOTE — Progress Notes (Signed)
 Orthopedic Tech Progress Note Patient Details:  Jared Mccoy 05/29/1990 [id number]  Ortho Devices Type of Ortho Device: Nonie boot Ortho Device/Splint Location: BLE Ortho Device/Splint Interventions: Ordered, Application, Adjustment   Post Interventions Patient Tolerated: Well Instructions Provided: Care of device, Adjustment of device  [name] 02/13/2024, 4:22 PM

## 2024-02-13 NOTE — Progress Notes (Signed)
 Pt bp is 185/98 IV hydralazine  was given per MD order.

## 2024-02-13 NOTE — Progress Notes (Signed)
 "   Progress Note  Patient Name: Jared Mccoy Date of Encounter: 02/13/2024  Primary Cardiologist:   None   Subjective   Hypertensive overnight.  Treated with IV hydral.  He thinks that his breathing is slightly better.  No pain.   Inpatient Medications    Scheduled Meds:  carvedilol   25 mg Oral BID WC   empagliflozin  10 mg Oral Daily   ferrous sulfate  325 mg Oral Q breakfast   furosemide   80 mg Intravenous BID   heparin   5,000 Units Subcutaneous Q8H   hydrALAZINE   10 mg Oral Q8H   nicotine   21 mg Transdermal Daily   sodium chloride  flush  3 mL Intravenous Q12H   spironolactone   50 mg Oral Daily   thiamine   100 mg Oral Daily   Continuous Infusions:  PRN Meds: acetaminophen  **OR** acetaminophen , docusate sodium , guaiFENesin-dextromethorphan, hydrALAZINE , ipratropium-albuterol , oxyCODONE    Vital Signs    Vitals:   02/13/24 0500 02/13/24 0605 02/13/24 0607 02/13/24 0805  BP:  (!) 150/86 (!) 150/86 (!) 156/90  Pulse:  81  86  Resp:    15  Temp:    97.7 F (36.5 C)  TempSrc:  Oral  Oral  SpO2:    95%  Weight: (!) 260.5 kg     Height:        Intake/Output Summary (Last 24 hours) at 02/13/2024 0850 Last data filed at 02/13/2024 0617 Gross per 24 hour  Intake 476 ml  Output 4400 ml  Net -3924 ml   Filed Weights   02/10/24 0411 02/12/24 0604 02/13/24 0500  Weight: (!) 266.7 kg (!) 264.9 kg (!) 260.5 kg    Telemetry    NSR - Personally Reviewed  ECG    NA - Personally Reviewed  Physical Exam   GEN: No acute distress.   Neck: No  JVD Cardiac: RRR, no murmurs, rubs, or gallops.  Respiratory:    Scattered diffuse wheezing GI: Soft, nontender, non-distended  MS:     Severe bilateral leg edema; No deformity. Neuro:  Nonfocal  Psych: Normal affect   Labs    Chemistry Recent Labs  Lab 02/08/24 0953 02/08/24 1410 02/09/24 1302 02/10/24 1028 02/11/24 0359 02/12/24 0428 02/13/24 0221  NA 137   < > 138   < > 138 140 140  K 3.6   < > 3.7   < > 4.0  3.6 3.5  CL 99  --  98   < > 98 97* 97*  CO2 31  --  32   < > 36* 38* 37*  GLUCOSE 98  --  88   < > 120* 92 86  BUN 13  --  17   < > 15 17 21*  CREATININE 1.39*  --  1.53*   < > 1.26* 1.54* 1.71*  CALCIUM 8.2*  --  8.3*   < > 8.8* 8.8* 9.0  PROT 7.2  --  7.4  --   --   --   --   ALBUMIN 3.4*  --  3.4*  --   --   --   --   AST 20  --  17  --   --   --   --   ALT 11  --  11  --   --   --   --   ALKPHOS 78  --  76  --   --   --   --   BILITOT 0.4  --  0.5  --   --   --   --  GFRNONAA >60  --  >60   < > >60 60* 53*  ANIONGAP 7  --  8   < > 4* 5 7   < > = values in this interval not displayed.     Hematology Recent Labs  Lab 02/09/24 1302 02/11/24 0359 02/12/24 0428  WBC 9.2 9.7 7.2  RBC 4.48 4.26 4.30  HGB 10.3* 10.0* 10.0*  HCT 35.7* 33.9* 33.2*  MCV 79.7* 79.6* 77.2*  MCH 23.0* 23.5* 23.3*  MCHC 28.9* 29.5* 30.1  RDW 19.2* 18.8* 18.6*  PLT 292 312 291    Cardiac EnzymesNo results for input(s): TROPONINI in the last 168 hours. No results for input(s): TROPIPOC in the last 168 hours.   BNP Recent Labs  Lab 02/08/24 0953  PROBNP 239.0     DDimer No results for input(s): DDIMER in the last 168 hours.   Radiology    No results found.  Cardiac Studies   Echo:  1. Left ventricular ejection fraction, by estimation, is 65 to 70%. The  left ventricle has normal function. The left ventricle has no regional  wall motion abnormalities. The left ventricular internal cavity size was  mildly dilated. There is moderate  left ventricular hypertrophy. Left ventricular diastolic parameters are  indeterminate.   2. Right ventricular systolic function is normal. The right ventricular  size is normal.   3. Left atrial size was moderately dilated.   4. Right atrial size was mildly dilated.   5. The mitral valve is normal in structure. No evidence of mitral valve  regurgitation. No evidence of mitral stenosis.   6. The aortic valve is normal in structure. Aortic valve  regurgitation is  not visualized. No aortic stenosis is present.   7. Aortic dilatation noted. There is mild dilatation of the aortic root,  measuring 40 mm.   8. The inferior vena cava is dilated in size with >50% respiratory  variability, suggesting right atrial pressure of 8 mmHg.   9. No clear vegetations but most valves were not well visualized. Poor  image quality.   Patient Profile     34 y.o. adult with a hx of hypertension, obstructive sleep apnea on CPAP, morbid obesity chronic bilateral lymphedema, chronic cigarette use who is being seen 02/11/2024 for the evaluation of CHF and dyspnea at the request of Paticia Pac, MD and question regarding possibility of right heart catheterization for volume determination.   Assessment & Plan    Acute on chronic diastolic HF:  Diagnostic testing limited by supramorbid obesity.  Continue current dose of beta blocker, spironolactone , Jardiance, Lasix  IV.   Net negative 12.2 liters this admission.   OSA/CPAP:  Using CPAP.    AKI:  Creat is creeping up.  However, I think we need to continuee the current meds today with massive volume overload and continue to follow.   I talked to him about keep his legs elevated.   HTN:  BP not controlled.  I will increase the hydralazine .     For questions or updates, please contact [facility] Please consult [email or web address] for contact info under Cardiology/STEMI.   Signed, Lynwood Schilling, MD  02/13/2024, 8:50 AM    "

## 2024-02-14 DIAGNOSIS — J9621 Acute and chronic respiratory failure with hypoxia: Secondary | ICD-10-CM

## 2024-02-14 DIAGNOSIS — I890 Lymphedema, not elsewhere classified: Secondary | ICD-10-CM | POA: Diagnosis not present

## 2024-02-14 DIAGNOSIS — I5033 Acute on chronic diastolic (congestive) heart failure: Secondary | ICD-10-CM | POA: Diagnosis not present

## 2024-02-14 DIAGNOSIS — R0602 Shortness of breath: Secondary | ICD-10-CM | POA: Diagnosis not present

## 2024-02-14 LAB — BASIC METABOLIC PANEL WITH GFR
Anion gap: 7 (ref 5–15)
BUN: 20 mg/dL (ref 6–20)
CO2: 36 mmol/L — ABNORMAL HIGH (ref 22–32)
Calcium: 8.7 mg/dL — ABNORMAL LOW (ref 8.9–10.3)
Chloride: 99 mmol/L (ref 98–111)
Creatinine, Ser: 1.67 mg/dL — ABNORMAL HIGH (ref 0.61–1.24)
GFR, Estimated: 54 mL/min — ABNORMAL LOW
Glucose, Bld: 87 mg/dL (ref 70–99)
Potassium: 3.6 mmol/L (ref 3.5–5.1)
Sodium: 142 mmol/L (ref 135–145)

## 2024-02-14 LAB — GLUCOSE, CAPILLARY
Glucose-Capillary: 102 mg/dL — ABNORMAL HIGH (ref 70–99)
Glucose-Capillary: 100 mg/dL — ABNORMAL HIGH (ref 70–99)
Glucose-Capillary: 84 mg/dL (ref 70–99)
Glucose-Capillary: 119 mg/dL — ABNORMAL HIGH (ref 70–99)

## 2024-02-14 MED ORDER — HYDRALAZINE HCL 50 MG PO TABS
75.0000 mg | ORAL_TABLET | Freq: Three times a day (TID) | ORAL | Status: DC
  Administered 2024-02-14 – 2024-02-15 (×3): 75 mg via ORAL
  Filled 2024-02-14 (×3): qty 1

## 2024-02-14 NOTE — Progress Notes (Addendum)
 " PROGRESS NOTE    Jared Mccoy  FMW:[id number] DOB: 1990-05-26 DOA: 02/08/2024 PCP: Celestia Rosaline SQUIBB, NP  33/M with morbid obesity, OSA, paroxysmal A-fib not on anticoagulation, chronic edema, concern for lymphedema presented to the ED with shortness of breath and hypoxia.  In the ED no fever or leukocytosis, CT a chest with possible multifocal pneumonia and adenopathy, enlarged main pulmonary artery, correlate for pulmonary hypertension - Admitted, started on antibiotics for multifocal pneumonia - 12/31, no improvement with antibiotics, starting diuretics   Subjective: - Continues to feel better overall, breathing and swelling is starting to improve  Assessment and Plan:  Acute on chronic D CHF, ? RV failure Pulmonary hypertension -ECHO-9/25 w/ EF 65-70, ? Normal RV, indeterminate Diastolic fxn -pt reports 100lb weight gain since taken off lasix  3mos ago (470> 588lb now) - Diuresing well, 16.9 L negative volume status is difficult to assess, not appropriate for right heart cath with morbid obesity -Continue Lasix  80 mg twice daily, Aldactone  ,  remains significantly volume overloaded - Appreciate cards input, started on Jardiance  12/31 - Unna boots - Increase activity, BMP in a.m.,   Acute hypoixic resp failure -multifactorial -PNA/CHF/OSA/OHS - Not on O2 at baseline - Continue CPAP nightly, his home machine reportedly broke, TOC consulted> assistance appreciated  Pneumonia - Did have a productive cough and concern for multifocal pneumonia on imaging, - Sputum culture is reintubated-noted few gram-positive cocci and rare gram-negative rods - Completed 5 days of ABX, antibiotics discontinued - Respiratory panel by RT-PCR got canceled for some reason, low yield at this point now without active symptoms  Hypertensive urgency - Now improving - Increase hydralazine  to 75 mg 3 times daily, continue carvedilol , diuretics as above  Paroxysmal A-fib - In sinus rhythm at this  time, not on anticoagulation at baseline, he could not tell me why  Chronic lymphedema, chronic skin changes - Has wounds on the posterior aspect - Continue wound care, needs significant weight loss, lymphedema clinic referral - PT eval  Cocaine positive On admission  Morbid obesity BMI 79.7 -Needs follow-up in obesity clinic, eval for GLP-1 recommended   DVT prophylaxis: Heparin  subcutaneous Code Status: Full code Family Communication: None none present Disposition Plan: Home pending improvement in volume status  Consultants:    Procedures:   Antimicrobials:    Objective: Vitals:   02/14/24 0438 02/14/24 0507 02/14/24 0510 02/14/24 0758  BP:  (!) 156/102 (!) 156/102 (!) 192/86  Pulse:  77  72  Resp:  17  20  Temp:  (!) 97.4 F (36.3 C)  (!) 97.4 F (36.3 C)  TempSrc:  Oral  Oral  SpO2:  91%  90%  Weight: (!) 259.9 kg     Height:        Intake/Output Summary (Last 24 hours) at 02/14/2024 1106 Last data filed at 02/14/2024 1043 Gross per 24 hour  Intake 600 ml  Output 4250 ml  Net -3650 ml   Filed Weights   02/12/24 0604 02/13/24 0500 02/14/24 0438  Weight: (!) 264.9 kg (!) 260.5 kg (!) 259.9 kg    Examination:  General exam: Appears calm and comfortable, morbidly obese, AAO x 3, no distress Respiratory system: Distant breath sounds Cardiovascular system: S1 & S2 heard, RRR.  Abd: nondistended, soft and nontender.Normal bowel sounds heard. Central nervous system: Alert and oriented. No focal neurological deficits. Extremities: Chronic skin changes, lymphedema Skin: No rashes Psychiatry:  Mood & affect appropriate.     Data Reviewed:   CBC: Recent Labs  Lab 02/08/24 0953 02/08/24 1410 02/08/24 2212 02/09/24 1302 02/11/24 0359 02/12/24 0428  WBC 9.0  --   --  9.2 9.7 7.2  NEUTROABS  --   --   --   --  6.6  --   HGB 10.5* 12.2* 12.2* 10.3* 10.0* 10.0*  HCT 36.2* 36.0* 36.0* 35.7* 33.9* 33.2*  MCV 80.1  --   --  79.7* 79.6* 77.2*  PLT 251  --    --  292 312 291   Basic Metabolic Panel: Recent Labs  Lab 02/08/24 1803 02/08/24 2212 02/09/24 1302 02/10/24 1028 02/11/24 0359 02/12/24 0428 02/13/24 0221 02/14/24 0241  NA  --    < > 138 137 138 140 140 142  K  --    < > 3.7 3.3* 4.0 3.6 3.5 3.6  CL  --   --  98 97* 98 97* 97* 99  CO2  --   --  32 34* 36* 38* 37* 36*  GLUCOSE  --   --  88 114* 120* 92 86 87  BUN  --   --  17 16 15 17  21* 20  CREATININE  --   --  1.53* 1.30* 1.26* 1.54* 1.71* 1.67*  CALCIUM  --   --  8.3* 8.5* 8.8* 8.8* 9.0 8.7*  MG 2.0  --  2.0 2.0  --   --   --   --    < > = values in this interval not displayed.   GFR: Estimated Creatinine Clearance (by C-G formula based on SCr of 1.67 mg/dL (H)) Male: 890.2 mL/min (A) Male: 131.4 mL/min (A) Liver Function Tests: Recent Labs  Lab 02/08/24 0953 02/09/24 1302  AST 20 17  ALT 11 11  ALKPHOS 78 76  BILITOT 0.4 0.5  PROT 7.2 7.4  ALBUMIN  3.4* 3.4*   No results for input(s): LIPASE, AMYLASE in the last 168 hours. No results for input(s): AMMONIA in the last 168 hours. Coagulation Profile: No results for input(s): INR, PROTIME in the last 168 hours. Cardiac Enzymes: No results for input(s): CKTOTAL, CKMB, CKMBINDEX, TROPONINI in the last 168 hours. BNP (last 3 results) Recent Labs    02/08/24 0953  PROBNP 239.0   HbA1C: No results for input(s): HGBA1C in the last 72 hours.  CBG: Recent Labs  Lab 02/12/24 1703 02/12/24 2130 02/13/24 0622 02/13/24 1201 02/14/24 0914  GLUCAP 99 103* 96 100* 102*   Lipid Profile: No results for input(s): CHOL, HDL, LDLCALC, TRIG, CHOLHDL, LDLDIRECT in the last 72 hours. Thyroid  Function Tests: No results for input(s): TSH, T4TOTAL, FREET4, T3FREE, THYROIDAB in the last 72 hours. Anemia Panel: No results for input(s): VITAMINB12, FOLATE, FERRITIN, TIBC, IRON, RETICCTPCT in the last 72 hours.  Urine analysis:    Component Value Date/Time    COLORURINE YELLOW (A) 10/25/2023 2200   APPEARANCEUR HAZY (A) 10/25/2023 2200   LABSPEC 1.021 10/25/2023 2200   PHURINE 7.0 10/25/2023 2200   GLUCOSEU NEGATIVE 10/25/2023 2200   HGBUR NEGATIVE 10/25/2023 2200   BILIRUBINUR NEGATIVE 10/25/2023 2200   KETONESUR NEGATIVE 10/25/2023 2200   PROTEINUR 100 (A) 10/25/2023 2200   UROBILINOGEN 1.0 10/15/2008 1535   NITRITE NEGATIVE 10/25/2023 2200   LEUKOCYTESUR NEGATIVE 10/25/2023 2200   Sepsis Labs: @LABRCNTIP (procalcitonin:4,lacticidven:4)  ) Recent Results (from the past 240 hours)  MRSA Next Gen by PCR, Nasal     Status: None   Collection Time: 02/09/24  2:56 PM   Specimen: Nasal Mucosa; Nasal Swab  Result Value Ref Range Status  MRSA by PCR Next Gen NOT DETECTED NOT DETECTED Final    Comment: (NOTE) The GeneXpert MRSA Assay (FDA approved for NASAL specimens only), is one component of a comprehensive MRSA colonization surveillance program. It is not intended to diagnose MRSA infection nor to guide or monitor treatment for MRSA infections. Test performance is not FDA approved in patients less than 36 years old. Performed at [facility], [address] [place], [place] [address]   Expectorated Sputum Assessment w Gram Stain, Rflx to Resp Cult     Status: None   Collection Time: 02/10/24  2:35 PM   Specimen: Expectorated Sputum  Result Value Ref Range Status   Specimen Description EXPECTORATED SPUTUM  Final   Special Requests NONE  Final   Sputum evaluation   Final    THIS SPECIMEN IS ACCEPTABLE FOR SPUTUM CULTURE Performed at [facility], [address], [place], [place] [address]    Report Status 02/10/2024 FINAL  Final  Culture, Respiratory w Gram Stain     Status: None   Collection Time: 02/10/24  2:35 PM  Result Value Ref Range Status   Specimen Description EXPECTORATED SPUTUM  Final   Special Requests NONE Reflexed from [id number]  Final   Gram Stain   Final    MODERATE WBC PRESENT,BOTH PMN AND  MONONUCLEAR FEW GRAM POSITIVE COCCI RARE GRAM NEGATIVE RODS    Culture   Final    FEW Normal respiratory flora-no Staph aureus or Pseudomonas seen Performed at [facility], [address], [place], [place] [address]    Report Status 02/13/2024 FINAL  Final     Radiology Studies: No results found.   Scheduled Meds:  carvedilol   25 mg Oral BID WC   empagliflozin   10 mg Oral Daily   ferrous sulfate   325 mg Oral Q breakfast   furosemide   80 mg Intravenous BID   heparin   5,000 Units Subcutaneous Q8H   hydrALAZINE   50 mg Oral Q8H   nicotine   21 mg Transdermal Daily   sodium chloride  flush  3 mL Intravenous Q12H   spironolactone   50 mg Oral Daily   thiamine   100 mg Oral Daily   Continuous Infusions:     LOS: 6 days    Time spent:    Sigurd Pac, MD [facility]   02/14/2024, 11:06 AM    "

## 2024-02-14 NOTE — Progress Notes (Addendum)
 "   Progress Note  Patient Name: Jared Mccoy Date of Encounter: 02/14/2024  [facility] Cardiologist: None   Patient Profile     Subjective   Diuresing well on Lasix  80mg  IV BID.  UOP yesterday 5 L and net neg 18.9 Lsince admit.   Denies any shortness of breath or chest pain.  SCr improved from 1.71->>1.67 today.  K+ 3.6  Inpatient Medications    Scheduled Meds:  carvedilol   25 mg Oral BID WC   empagliflozin   10 mg Oral Daily   ferrous sulfate   325 mg Oral Q breakfast   furosemide   80 mg Intravenous BID   heparin   5,000 Units Subcutaneous Q8H   hydrALAZINE   75 mg Oral Q8H   nicotine   21 mg Transdermal Daily   sodium chloride  flush  3 mL Intravenous Q12H   spironolactone   50 mg Oral Daily   thiamine   100 mg Oral Daily   Continuous Infusions:   PRN Meds: acetaminophen  **OR** acetaminophen , docusate sodium , guaiFENesin -dextromethorphan , hydrALAZINE , ipratropium-albuterol , oxyCODONE    Vital Signs    Vitals:   02/14/24 0507 02/14/24 0510 02/14/24 0758 02/14/24 1115  BP: (!) 156/102 (!) 156/102 (!) 192/86 (!) 182/77  Pulse: 77  72 78  Resp: 17  20 15   Temp: (!) 97.4 F (36.3 C)  (!) 97.4 F (36.3 C) 98.2 F (36.8 C)  TempSrc: Oral  Oral Oral  SpO2: 91%  90% 91%  Weight:      Height:        Intake/Output Summary (Last 24 hours) at 02/14/2024 1257 Last data filed at 02/14/2024 1251 Gross per 24 hour  Intake 600 ml  Output 4875 ml  Net -4275 ml      02/14/2024    4:38 AM 02/13/2024    5:00 AM 02/12/2024    6:04 AM  Last 3 Weights  Weight (lbs) 572 lb 15.6 oz 574 lb 4.8 oz 584 lb  Weight (kg) 259.9 kg 260.5 kg 264.901 kg      Telemetry    Normal sinus rhythm- Personally Reviewed  ECG    No new EKG to review - Personally Reviewed  Physical Exam   GEN: Well nourished, well developed in no acute distress super morbid obesity HEENT: Normal NECK: No JVD; No carotid bruits LYMPHATICS: No lymphadenopathy CARDIAC:RRR, no murmurs, rubs,  gallops RESPIRATORY:  Clear to auscultation without rales, wheezing or rhonchi  ABDOMEN: Soft, non-tender, non-distended MUSCULOSKELETAL: 3+ pitting edema with chronic skin thickening from chronic venous insufficiency edema; No deformity  SKIN: Warm and dry NEUROLOGIC:  Alert and oriented x 3 PSYCHIATRIC:  Normal affect  Labs    High Sensitivity Troponin:  No results for input(s): TROPONINIHS in the last 720 hours.    Chemistry Recent Labs  Lab 02/08/24 0953 02/08/24 1410 02/09/24 1302 02/10/24 1028 02/12/24 0428 02/13/24 0221 02/14/24 0241  NA 137   < > 138   < > 140 140 142  K 3.6   < > 3.7   < > 3.6 3.5 3.6  CL 99  --  98   < > 97* 97* 99  CO2 31  --  32   < > 38* 37* 36*  GLUCOSE 98  --  88   < > 92 86 87  BUN 13  --  17   < > 17 21* 20  CREATININE 1.39*  --  1.53*   < > 1.54* 1.71* 1.67*  CALCIUM 8.2*  --  8.3*   < > 8.8*  9.0 8.7*  PROT 7.2  --  7.4  --   --   --   --   ALBUMIN  3.4*  --  3.4*  --   --   --   --   AST 20  --  17  --   --   --   --   ALT 11  --  11  --   --   --   --   ALKPHOS 78  --  76  --   --   --   --   BILITOT 0.4  --  0.5  --   --   --   --   GFRNONAA >60  --  >60   < > 60* 53* 54*  ANIONGAP 7  --  8   < > 5 7 7    < > = values in this interval not displayed.     Hematology Recent Labs  Lab 02/09/24 1302 02/11/24 0359 02/12/24 0428  WBC 9.2 9.7 7.2  RBC 4.48 4.26 4.30  HGB 10.3* 10.0* 10.0*  HCT 35.7* 33.9* 33.2*  MCV 79.7* 79.6* 77.2*  MCH 23.0* 23.5* 23.3*  MCHC 28.9* 29.5* 30.1  RDW 19.2* 18.8* 18.6*  PLT 292 312 291    BNP Recent Labs  Lab 02/08/24 0953  PROBNP 239.0     DDimer No results for input(s): DDIMER in the last 168 hours.   CHA2DS2-VASc Score = 1   This indicates a 0.6% annual risk of stroke. The patient's score is based upon: CHF History: 0 HTN History: 1 Diabetes History: 0 Stroke History: 0 Vascular Disease History: 0 Age Score: 0 Gender Score: 0       Radiology    No results  found.  Patient Profile     34 y.o. adult with a hx of hypertension, obstructive sleep apnea on CPAP, morbid obesity chronic bilateral lymphedema, chronic cigarette use who is being seen 02/11/2024 for the evaluation of CHF and dyspnea at the request of Paticia Pac, MD and question regarding possibility of right heart catheterization for volume determination.   Assessment & Plan    #Acute on Chronic Hypoxemic and Hypercarbic Respiratory Failure #Pickwickian Syndrome #Supra Morbid Obesity #CAP #Acute on Chronic Diastolic CHF #Chronic Bilateral Lymphedema #OSA on CPAP -not a candidate for RHC due to supra supra morbid obesity (BMI 80). -diuresing well on Lasix  80mg  IV BID:UOP 5 L  yesterday and net neg 18.9 L since admit. -SCr improved from 1.71->>1.67 today, K+ 3.6 -Remains volume overloaded GDMT Continue carvedilol  25 mg twice daily  Continue spironolactone  50 mg daily  Continue Jardiance  10 mg daily  Crease hydralazine  to 75 mg 3 times daily for better blood pressure control Continue Lasix  80 mg IV twice daily  -Rx of CAP with antbx per TRH -follow daily weights, renal function and strict I&O's while diuresing -continue fluid restriction with 1500cc/24hr.  HTN -BP remains elevated at 182/77 mmHg -Increase hydralazine  to 75 mg 3 times daily -Continue carvedilol  25 mg twice daily  I spent 35  minutes caring for this patient today face to face, ordering and reviewing labs, reviewing records from 2D echo this admission, seeing the patient, documenting in the record  For questions or updates, please contact [facility] Please consult [email or web address] for contact info under        Signed, Wilbert Bihari, MD  02/12/2024 11:37AM "

## 2024-02-14 NOTE — Plan of Care (Signed)

## 2024-02-14 NOTE — Plan of Care (Signed)
   Problem: Education: Goal: Knowledge of General Education information will improve Description: Including pain rating scale, medication(s)/side effects and non-pharmacologic comfort measures Outcome: Progressing   Problem: Coping: Goal: Level of anxiety will decrease Outcome: Progressing

## 2024-02-15 ENCOUNTER — Other Ambulatory Visit (HOSPITAL_COMMUNITY): Payer: Self-pay

## 2024-02-15 DIAGNOSIS — R0602 Shortness of breath: Secondary | ICD-10-CM | POA: Diagnosis not present

## 2024-02-15 DIAGNOSIS — R600 Localized edema: Secondary | ICD-10-CM | POA: Diagnosis not present

## 2024-02-15 DIAGNOSIS — I5031 Acute diastolic (congestive) heart failure: Secondary | ICD-10-CM | POA: Diagnosis not present

## 2024-02-15 LAB — GLUCOSE, CAPILLARY: Glucose-Capillary: 94 mg/dL (ref 70–99)

## 2024-02-15 LAB — BASIC METABOLIC PANEL WITH GFR
Anion gap: 7 (ref 5–15)
BUN: 22 mg/dL — ABNORMAL HIGH (ref 6–20)
CO2: 35 mmol/L — ABNORMAL HIGH (ref 22–32)
Calcium: 8.8 mg/dL — ABNORMAL LOW (ref 8.9–10.3)
Chloride: 99 mmol/L (ref 98–111)
Creatinine, Ser: 1.77 mg/dL — ABNORMAL HIGH (ref 0.61–1.24)
GFR, Estimated: 51 mL/min — ABNORMAL LOW
Glucose, Bld: 90 mg/dL (ref 70–99)
Potassium: 3.5 mmol/L (ref 3.5–5.1)
Sodium: 141 mmol/L (ref 135–145)

## 2024-02-15 MED ORDER — SPIRONOLACTONE 50 MG PO TABS
50.0000 mg | ORAL_TABLET | Freq: Every day | ORAL | 0 refills | Status: AC
  Filled 2024-02-15: qty 30, 30d supply, fill #0

## 2024-02-15 MED ORDER — HYDRALAZINE HCL 25 MG PO TABS: 25.0000 mg | ORAL_TABLET | Freq: Once | ORAL | Status: DC

## 2024-02-15 MED ORDER — HYDRALAZINE HCL 50 MG PO TABS: 100.0000 mg | ORAL_TABLET | Freq: Three times a day (TID) | ORAL | Status: DC

## 2024-02-15 MED ORDER — ALBUMIN HUMAN 25 % IV SOLN: 25.0000 g | Freq: Once | INTRAVENOUS | Status: DC

## 2024-02-15 MED ORDER — POTASSIUM CHLORIDE CRYS ER 20 MEQ PO TBCR: 40.0000 meq | EXTENDED_RELEASE_TABLET | Freq: Once | ORAL | Status: DC

## 2024-02-15 MED ORDER — FUROSEMIDE 80 MG PO TABS
80.0000 mg | ORAL_TABLET | Freq: Two times a day (BID) | ORAL | 0 refills | Status: DC
  Filled 2024-02-15: qty 30, 15d supply, fill #0

## 2024-02-15 NOTE — Plan of Care (Signed)

## 2024-02-15 NOTE — Progress Notes (Signed)
 "   Progress Note  Patient Name: Jared Mccoy Date of Encounter: 02/15/2024  [facility] Cardiologist: None   Patient Profile     Subjective   Diuresing well on Lasix  80mg  IV BID.  UOP yesterday 4 L and net neg 20.2 Lsince admit.   No shortness of breath or chest pain.  SCr improved from bump slightly from 1.67-1.77 today.  K+ 3.5  Inpatient Medications    Scheduled Meds:  carvedilol   25 mg Oral BID WC   empagliflozin   10 mg Oral Daily   ferrous sulfate   325 mg Oral Q breakfast   furosemide   80 mg Intravenous BID   heparin   5,000 Units Subcutaneous Q8H   hydrALAZINE   75 mg Oral Q8H   nicotine   21 mg Transdermal Daily   sodium chloride  flush  3 mL Intravenous Q12H   spironolactone   50 mg Oral Daily   thiamine   100 mg Oral Daily   Continuous Infusions:   PRN Meds: acetaminophen  **OR** acetaminophen , docusate sodium , guaiFENesin -dextromethorphan , hydrALAZINE , ipratropium-albuterol , oxyCODONE    Vital Signs    Vitals:   02/15/24 0349 02/15/24 0354 02/15/24 0528 02/15/24 0812  BP: (!) 174/98 (!) 174/98 (!) 166/97 (!) 161/89  Pulse: 78   87  Resp: 18   19  Temp: 98.2 F (36.8 C)   97.9 F (36.6 C)  TempSrc: Oral   Oral  SpO2: 95%   100%  Weight:      Height:        Intake/Output Summary (Last 24 hours) at 02/15/2024 0915 Last data filed at 02/15/2024 0816 Gross per 24 hour  Intake 1193 ml  Output 4425 ml  Net -3232 ml      02/15/2024    3:48 AM 02/14/2024    4:38 AM 02/13/2024    5:00 AM  Last 3 Weights  Weight (lbs) 543 lb 10.5 oz 572 lb 15.6 oz 574 lb 4.8 oz  Weight (kg) 246.6 kg 259.9 kg 260.5 kg      Telemetry    Normal sinus rhythm- Personally Reviewed  ECG    No new EKG to review - Personally Reviewed  Physical Exam   GEN: Well nourished, well developed in no acute distress super morbid obesity HEENT: Normal NECK: Cannot assess JVD accurately due to morbid obesity; No carotid bruits LYMPHATICS: No lymphadenopathy CARDIAC:RRR, no murmurs,  rubs, gallops RESPIRATORY:  Clear to auscultation without rales, wheezing or rhonchi  ABDOMEN: Soft, non-tender, non-distended MUSCULOSKELETAL: 3+ bilateral lower extremity pitting  edema; No deformity  SKIN: Warm and dry NEUROLOGIC:  Alert and oriented x 3 PSYCHIATRIC:  Normal affect  Labs    High Sensitivity Troponin:  No results for input(s): TROPONINIHS in the last 720 hours.    Chemistry Recent Labs  Lab 02/08/24 0953 02/08/24 1410 02/09/24 1302 02/10/24 1028 02/13/24 0221 02/14/24 0241 02/15/24 0227  NA 137   < > 138   < > 140 142 141  K 3.6   < > 3.7   < > 3.5 3.6 3.5  CL 99  --  98   < > 97* 99 99  CO2 31  --  32   < > 37* 36* 35*  GLUCOSE 98  --  88   < > 86 87 90  BUN 13  --  17   < > 21* 20 22*  CREATININE 1.39*  --  1.53*   < > 1.71* 1.67* 1.77*  CALCIUM 8.2*  --  8.3*   < > 9.0  8.7* 8.8*  PROT 7.2  --  7.4  --   --   --   --   ALBUMIN  3.4*  --  3.4*  --   --   --   --   AST 20  --  17  --   --   --   --   ALT 11  --  11  --   --   --   --   ALKPHOS 78  --  76  --   --   --   --   BILITOT 0.4  --  0.5  --   --   --   --   GFRNONAA >60  --  >60   < > 53* 54* 51*  ANIONGAP 7  --  8   < > 7 7 7    < > = values in this interval not displayed.     Hematology Recent Labs  Lab 02/09/24 1302 02/11/24 0359 02/12/24 0428  WBC 9.2 9.7 7.2  RBC 4.48 4.26 4.30  HGB 10.3* 10.0* 10.0*  HCT 35.7* 33.9* 33.2*  MCV 79.7* 79.6* 77.2*  MCH 23.0* 23.5* 23.3*  MCHC 28.9* 29.5* 30.1  RDW 19.2* 18.8* 18.6*  PLT 292 312 291    BNP Recent Labs  Lab 02/08/24 0953  PROBNP 239.0     DDimer No results for input(s): DDIMER in the last 168 hours.   CHA2DS2-VASc Score = 1   This indicates a 0.6% annual risk of stroke. The patient's score is based upon: CHF History: 0 HTN History: 1 Diabetes History: 0 Stroke History: 0 Vascular Disease History: 0 Age Score: 0 Gender Score: 0       Radiology    No results found.  Patient Profile     34 y.o. adult  with a hx of hypertension, obstructive sleep apnea on CPAP, morbid obesity chronic bilateral lymphedema, chronic cigarette use who is being seen 02/11/2024 for the evaluation of CHF and dyspnea at the request of Paticia Pac, MD and question regarding possibility of right heart catheterization for volume determination.   Assessment & Plan    #Acute on Chronic Hypoxemic and Hypercarbic Respiratory Failure #Pickwickian Syndrome #Supra Morbid Obesity #CAP #Acute on Chronic Diastolic CHF #Chronic Bilateral Lymphedema #OSA on CPAP -not a candidate for RHC due to supra supra morbid obesity (BMI 80). -diuresing well on Lasix  80mg  IV BID:UOP 5 L  yesterday and net neg 18.9 L since admit. -SCr bumped slightly from 1.67->>1.77  today, K+ 3.5 - Remains massively volume overloaded GDMT Continue carvedilol  25 mg twice daily  Spironolactone  50 mg daily  Continue Jardiance  10 mg daily Increasing hydralazine  to 100 mg 3 times daily for better blood pressure control  Continue Lasix  80 mg IV twice daily -Rx of CAP with antbx per TRH -follow daily weights, renal function and strict I&O's while diuresing -continue fluid restriction with 1500cc/24hr.  HTN -BP remains elevated at 161/89 mmHg -Increase hydralazine  to 100 mg 3 times daily -Continue carvedilol  25 mg twice daily -Continue spironolactone  50 mg daily  Hypokalemia - Replete K+ to keep greater than 4  I spent 35  minutes caring for this patient today face to face, ordering and reviewing labs, reviewing records from 2D echo this admission, seeing the patient, documenting in the record  For questions or updates, please contact [facility] Please consult [email or web address] for contact info under        Signed, Wilbert Bihari, MD  02/12/2024 11:37AM "

## 2024-02-15 NOTE — Progress Notes (Addendum)
 " PROGRESS NOTE    Jared Mccoy  FMW:[id number] DOB: 10-21-1990 DOA: 02/08/2024 PCP: Celestia Rosaline SQUIBB, NP  33/M with morbid obesity, OSA,  chronic edema, concern for lymphedema presented to the ED with shortness of breath and hypoxia.  In the ED no fever or leukocytosis, CT a chest with possible multifocal pneumonia and adenopathy, enlarged main pulmonary artery, correlate for pulmonary hypertension - Admitted, started on antibiotics for multifocal pneumonia - 12/31, no improvement with antibiotics, starting diuretics   Subjective: - Feels better overall, swelling continues to improve, weight trending down, wants to go home  Assessment and Plan:  Acute on chronic D CHF, ? RV failure Pulmonary hypertension -ECHO-9/25 w/ EF 65-70, ? Normal RV, indeterminate Diastolic fxn -pt reports 100lb weight gain since taken off lasix  3mos ago (470> 588lb now) - Diuresing well, 19.8 negative volume status is difficult to assess, weight down to 543 LB today, not appropriate for right heart cath with morbid obesity -Continue Lasix  80 mg twice daily, Aldactone  ,  remains significantly volume overloaded - Appreciate cards input, started on Jardiance  12/31 - Unna boots - Anxious to go home despite volume overloaded state, counseled  Acute hypoixic resp failure -multifactorial -PNA/CHF/OSA/OHS - Not on O2 at baseline - Continue CPAP nightly, his home machine reportedly broke, TOC consulted> assistance appreciated  Pneumonia - Did have a productive cough and concern for multifocal pneumonia on imaging, - Sputum culture is reintubated-noted few gram-positive cocci and rare gram-negative rods - Completed 5 days of ABX, antibiotics discontinued - Respiratory panel by RT-PCR got canceled for some reason, low yield at this point now without active symptoms  Hypertensive urgency - Now improving - Increase hydralazine  to 75 mg TID yesterday, continue carvedilol , diuretics as above  Chronic  lymphedema, chronic skin changes - Has wounds on the posterior aspect - Continue wound care, needs significant weight loss, lymphedema clinic referral - PT eval  Cocaine positive On admission  Morbid obesity BMI 79.7 -Needs follow-up in obesity clinic, eval for GLP-1 recommended   DVT prophylaxis: Heparin  subcutaneous Code Status: Full code Family Communication: None none present Disposition Plan: Home pending improvement in volume status  Consultants:    Procedures:   Antimicrobials:    Objective: Vitals:   02/15/24 0349 02/15/24 0354 02/15/24 0528 02/15/24 0812  BP: (!) 174/98 (!) 174/98 (!) 166/97 (!) 161/89  Pulse: 78   87  Resp: 18   19  Temp: 98.2 F (36.8 C)   97.9 F (36.6 C)  TempSrc: Oral   Oral  SpO2: 95%   100%  Weight:      Height:        Intake/Output Summary (Last 24 hours) at 02/15/2024 1039 Last data filed at 02/15/2024 0816 Gross per 24 hour  Intake 833 ml  Output 4075 ml  Net -3242 ml   Filed Weights   02/13/24 0500 02/14/24 0438 02/15/24 0348  Weight: (!) 260.5 kg (!) 259.9 kg (!) 246.6 kg    Examination:  General exam: Appears calm and comfortable, morbidly obese, AAO x 3, no distress Respiratory system: Distant breath sounds Cardiovascular system: S1 & S2 heard, RRR.  Abd: nondistended, soft and nontender.Normal bowel sounds heard. Central nervous system: Alert and oriented. No focal neurological deficits. Extremities: Chronic skin changes, lymphedema Skin: No rashes Psychiatry:  Mood & affect appropriate.     Data Reviewed:   CBC: Recent Labs  Lab 02/08/24 1410 02/08/24 2212 02/09/24 1302 02/11/24 0359 02/12/24 0428  WBC  --   --  9.2 9.7 7.2  NEUTROABS  --   --   --  6.6  --   HGB 12.2* 12.2* 10.3* 10.0* 10.0*  HCT 36.0* 36.0* 35.7* 33.9* 33.2*  MCV  --   --  79.7* 79.6* 77.2*  PLT  --   --  292 312 291   Basic Metabolic Panel: Recent Labs  Lab 02/08/24 1803 02/08/24 2212 02/09/24 1302 02/10/24 1028  02/11/24 0359 02/12/24 0428 02/13/24 0221 02/14/24 0241 02/15/24 0227  NA  --    < > 138 137 138 140 140 142 141  K  --    < > 3.7 3.3* 4.0 3.6 3.5 3.6 3.5  CL  --    < > 98 97* 98 97* 97* 99 99  CO2  --    < > 32 34* 36* 38* 37* 36* 35*  GLUCOSE  --    < > 88 114* 120* 92 86 87 90  BUN  --    < > 17 16 15 17  21* 20 22*  CREATININE  --    < > 1.53* 1.30* 1.26* 1.54* 1.71* 1.67* 1.77*  CALCIUM  --    < > 8.3* 8.5* 8.8* 8.8* 9.0 8.7* 8.8*  MG 2.0  --  2.0 2.0  --   --   --   --   --    < > = values in this interval not displayed.   GFR: Estimated Creatinine Clearance (by C-G formula based on SCr of 1.77 mg/dL (H)) Male: 00.1 mL/min (A) Male: 119.6 mL/min (A) Liver Function Tests: Recent Labs  Lab 02/09/24 1302  AST 17  ALT 11  ALKPHOS 76  BILITOT 0.5  PROT 7.4  ALBUMIN  3.4*   No results for input(s): LIPASE, AMYLASE in the last 168 hours. No results for input(s): AMMONIA in the last 168 hours. Coagulation Profile: No results for input(s): INR, PROTIME in the last 168 hours. Cardiac Enzymes: No results for input(s): CKTOTAL, CKMB, CKMBINDEX, TROPONINI in the last 168 hours. BNP (last 3 results) Recent Labs    02/08/24 0953  PROBNP 239.0   HbA1C: No results for input(s): HGBA1C in the last 72 hours.  CBG: Recent Labs  Lab 02/14/24 0914 02/14/24 1114 02/14/24 1524 02/14/24 2014 02/15/24 0600  GLUCAP 102* 100* 84 119* 94   Lipid Profile: No results for input(s): CHOL, HDL, LDLCALC, TRIG, CHOLHDL, LDLDIRECT in the last 72 hours. Thyroid  Function Tests: No results for input(s): TSH, T4TOTAL, FREET4, T3FREE, THYROIDAB in the last 72 hours. Anemia Panel: No results for input(s): VITAMINB12, FOLATE, FERRITIN, TIBC, IRON, RETICCTPCT in the last 72 hours.  Urine analysis:    Component Value Date/Time   COLORURINE YELLOW (A) 10/25/2023 2200   APPEARANCEUR HAZY (A) 10/25/2023 2200   LABSPEC 1.021 10/25/2023  2200   PHURINE 7.0 10/25/2023 2200   GLUCOSEU NEGATIVE 10/25/2023 2200   HGBUR NEGATIVE 10/25/2023 2200   BILIRUBINUR NEGATIVE 10/25/2023 2200   KETONESUR NEGATIVE 10/25/2023 2200   PROTEINUR 100 (A) 10/25/2023 2200   UROBILINOGEN 1.0 10/15/2008 1535   NITRITE NEGATIVE 10/25/2023 2200   LEUKOCYTESUR NEGATIVE 10/25/2023 2200   Sepsis Labs: @LABRCNTIP (procalcitonin:4,lacticidven:4)  ) Recent Results (from the past 240 hours)  MRSA Next Gen by PCR, Nasal     Status: None   Collection Time: 02/09/24  2:56 PM   Specimen: Nasal Mucosa; Nasal Swab  Result Value Ref Range Status   MRSA by PCR Next Gen NOT DETECTED NOT DETECTED Final    Comment: (NOTE) The  GeneXpert MRSA Assay (FDA approved for NASAL specimens only), is one component of a comprehensive MRSA colonization surveillance program. It is not intended to diagnose MRSA infection nor to guide or monitor treatment for MRSA infections. Test performance is not FDA approved in patients less than 40 years old. Performed at [facility], [address] [place], [place] [address]   Expectorated Sputum Assessment w Gram Stain, Rflx to Resp Cult     Status: None   Collection Time: 02/10/24  2:35 PM   Specimen: Expectorated Sputum  Result Value Ref Range Status   Specimen Description EXPECTORATED SPUTUM  Final   Special Requests NONE  Final   Sputum evaluation   Final    THIS SPECIMEN IS ACCEPTABLE FOR SPUTUM CULTURE Performed at [facility], [address], [place], [place] [address]    Report Status 02/10/2024 FINAL  Final  Culture, Respiratory w Gram Stain     Status: None   Collection Time: 02/10/24  2:35 PM  Result Value Ref Range Status   Specimen Description EXPECTORATED SPUTUM  Final   Special Requests NONE Reflexed from [id number]  Final   Gram Stain   Final    MODERATE WBC PRESENT,BOTH PMN AND MONONUCLEAR FEW GRAM POSITIVE COCCI RARE GRAM NEGATIVE RODS    Culture   Final    FEW Normal respiratory flora-no  Staph aureus or Pseudomonas seen Performed at [facility], [address], [place], [place] [address]    Report Status 02/13/2024 FINAL  Final     Radiology Studies: No results found.   Scheduled Meds:  carvedilol   25 mg Oral BID WC   empagliflozin   10 mg Oral Daily   ferrous sulfate   325 mg Oral Q breakfast   furosemide   80 mg Intravenous BID   heparin   5,000 Units Subcutaneous Q8H   hydrALAZINE   100 mg Oral Q8H   hydrALAZINE   25 mg Oral Once   nicotine   21 mg Transdermal Daily   potassium chloride   40 mEq Oral Once   sodium chloride  flush  3 mL Intravenous Q12H   spironolactone   50 mg Oral Daily   thiamine   100 mg Oral Daily   Continuous Infusions:  albumin  human        LOS: 7 days    Time spent:    Sigurd Pac, MD [facility]   02/15/2024, 10:39 AM    "

## 2024-02-15 NOTE — Progress Notes (Signed)
 Pt left AMA and signed the AMA form and Fairy, MD was notified and aware of it, Thanks, Garrel FALCON RN

## 2024-02-16 ENCOUNTER — Telehealth: Payer: Self-pay

## 2024-02-16 NOTE — Transitions of Care (Post Inpatient/ED Visit) (Signed)
 "  02/16/2024  Name: Jared Mccoy MRN: [id number] DOB: 17-Apr-1990  Today's TOC FU Call Status: Today's TOC FU Call Status:: Successful TOC FU Call Completed TOC FU Call Complete Date: 02/16/24  Patient's Name and Date of Birth confirmed. DOB, Name  Transition Care Management Follow-up Telephone Call Date of Discharge: 02/15/24 Discharge Facility: [facility]) Type of Discharge: Inpatient Admission Primary Inpatient Discharge Diagnosis:: hypoxia- left AMA How have you been since you were released from the hospital?: Same (He said he is feeling the same, trying to get his energy back.) Any questions or concerns?: Yes Patient Questions/Concerns:: He asked if he would be able to get a refill of the lasix  and told him that he should be seen by his provider for a hospitlal follow up. Patient Questions/Concerns Addressed: Other: (I scheduled him with Rosaline Bohr NP 02/26/2023.)  Items Reviewed: Did you receive and understand the discharge instructions provided?: No (He left AMA and did not receive an AVS) Medications obtained,verified, and reconciled?: Yes (Medications Reviewed) (He has all medications that are noted in his med profile.) Any new allergies since your discharge?: No Dietary orders reviewed?: Yes Type of Diet Ordered:: heart healthy,low sodium Do you have support at home?: Yes Name of Support/Comfort Primary Source: He did not specify who can assist and said he is independent  He has young children at home.  Medications Reviewed Today: Medications Reviewed Today     Reviewed by Marvis Bradley, RN (Case Manager) on 02/16/24 at 1801  Med List Status: <None>   Medication Order Taking? Sig Documenting Provider Last Dose Status Informant  acetaminophen  (TYLENOL ) 500 MG tablet [id number] No Take 1,000 mg by mouth 2 (two) times daily as needed for headache or fever (pain). [provider] Past Week Active Self, Pharmacy Records  amLODipine  (NORVASC ) 10 MG tablet  [id number] No Take 1 tablet (10 mg total) by mouth daily. Vannie Reche RAMAN, NP 02/07/2024 Active Self, Pharmacy Records           Med Note (COFFELL, JON HERO   Wed Feb 11, 2024 12:14 PM) LF sold and shipped by [facility] mail service on 11/30/23 #30, 30 DS.  carvedilol  (COREG ) 25 MG tablet [id number] No Take 1 tablet (25 mg total) by mouth 2 (two) times daily with a meal. Vannie Reche RAMAN, NP 02/07/2024 Active Self, Pharmacy Records           Med Note (COFFELL, JON HERO   Wed Feb 11, 2024 12:14 PM) LF sold and shipped by [facility] mail service on 11/30/23 #60, 30 DS.  furosemide  (LASIX ) 80 MG tablet [id number]  Take 1 tablet (80 mg total) by mouth 2 (two) times daily. Fairy Frames, MD  Active   hydrALAZINE  (APRESOLINE ) 100 MG tablet [id number] No Take 1 tablet (100 mg total) by mouth every 8 (eight) hours. Vannie Reche RAMAN, NP 02/07/2024 Active Self, Pharmacy Records           Med Note (COFFELL, JON HERO   Wed Feb 11, 2024 12:13 PM) LF sold and shipped by [facility] mail service on 12/01/23 #90, 30 DS.  irbesartan  (AVAPRO ) 150 MG tablet [id number] No Take 1 tablet (150 mg total) by mouth daily. Lonni Slain, MD 02/07/2024 Active Self, Pharmacy Records           Med Note (COFFELL, JON HERO   Wed Feb 11, 2024 12:13 PM) LF  sold and shipped by [facility] mail service on 12/05/23 #90, 90 DS.  spironolactone  (ALDACTONE ) 50 MG tablet [id number]  Take  1 tablet (50 mg total) by mouth daily. Fairy Frames, MD  Active             Home Care and Equipment/Supplies: Were Home Health Services Ordered?: No (He left AMA before home health was ordered but he said he has been receiving home health RN visits 2x/week from [facility] [employer] dressing changes.  He said his nurse called today to see if he was home but she didn't schedule a visit yet) Any new equipment or medical supplies ordered?: No (He stated his CPAP machine is not working and he knows that he needs to call [facility] to schedule an appointment to have  it checked. He stated he has the phone number for [facility].  He said he just received the machine a couple of months ago.)  Functional Questionnaire: Do you need assistance with bathing/showering or dressing?: No Do you need assistance with meal preparation?: No Do you need assistance with eating?: No Do you have difficulty maintaining continence: No Do you need assistance with getting out of bed/getting out of a chair/moving?: No Do you have difficulty managing or taking your medications?: No  Follow up appointments reviewed: PCP Follow-up appointment confirmed?: Yes Date of PCP follow-up appointment?: 02/26/24 Follow-up Provider: Rosaline Bohr, NP Specialist Hospital Follow-up appointment confirmed?: No Reason Specialist Follow-Up Not Confirmed:  (He will need to discuss follow up referrals with PCP) Do you need transportation to your follow-up appointment?: No Do you understand care options if your condition(s) worsen?: Yes-patient verbalized understanding    SIGNATURE Slater Diesel, RN   "

## 2024-02-17 ENCOUNTER — Telehealth: Payer: Self-pay

## 2024-02-17 NOTE — Telephone Encounter (Addendum)
 MIchelle- FYI  He left AMA.  He said he is feeling the same as when he left the hospital and he is trying to get his energy back.  He reports that he has all medications that are noted on his medication profile. He asked if he would be able to get a refill of the lasix  that was just prescribed and tI old him that he should be seen by his provider for a hospital follow up. He has an appointment at [facility]  02/26/2024.   He left AMA before home health was ordered but he said he has been receiving home health RN visits 2x/week from [facility] [employer] dressing changes. He said his nurse called to see if he was home but she didn't schedule a visit yet  He stated his CPAP machine is not working and he knows that he needs to call [facility] to schedule an appointment to have it checked. He stated he has the phone number for [facility]. He said he just received the machine a couple of months ago.

## 2024-02-19 ENCOUNTER — Other Ambulatory Visit (INDEPENDENT_AMBULATORY_CARE_PROVIDER_SITE_OTHER): Payer: Self-pay | Admitting: Primary Care

## 2024-02-19 DIAGNOSIS — T8130XD Disruption of wound, unspecified, subsequent encounter: Secondary | ICD-10-CM

## 2024-02-19 DIAGNOSIS — L03116 Cellulitis of left lower limb: Secondary | ICD-10-CM

## 2024-02-25 ENCOUNTER — Telehealth (INDEPENDENT_AMBULATORY_CARE_PROVIDER_SITE_OTHER): Payer: Self-pay | Admitting: Primary Care

## 2024-02-25 NOTE — Telephone Encounter (Signed)
 Left VM with pt about their upcoming appt. Pt did not answer

## 2024-02-26 ENCOUNTER — Inpatient Hospital Stay (INDEPENDENT_AMBULATORY_CARE_PROVIDER_SITE_OTHER): Payer: Self-pay | Admitting: Primary Care

## 2024-02-26 ENCOUNTER — Ambulatory Visit: Admitting: Orthopedic Surgery

## 2024-02-26 DIAGNOSIS — I890 Lymphedema, not elsewhere classified: Secondary | ICD-10-CM | POA: Diagnosis not present

## 2024-02-26 DIAGNOSIS — L97211 Non-pressure chronic ulcer of right calf limited to breakdown of skin: Secondary | ICD-10-CM

## 2024-02-26 DIAGNOSIS — I872 Venous insufficiency (chronic) (peripheral): Secondary | ICD-10-CM | POA: Diagnosis not present

## 2024-03-01 ENCOUNTER — Encounter: Payer: Self-pay | Admitting: Orthopedic Surgery

## 2024-03-01 NOTE — Progress Notes (Signed)
 "  Office Visit Note   Patient: Jared Mccoy           Date of Birth: [birth year]           MRN: [id number] Visit Date: 02/26/2024              Requested by: Celestia Rosaline SQUIBB, NP [address] [place],  [place] [address] PCP: Celestia Rosaline SQUIBB, NP  Chief Complaint  Patient presents with   Left Leg - Pain      HPI: Discussed the use of AI scribe software for clinical note transcription with the patient, who gave verbal consent to proceed.  History of Present Illness Jared Mccoy is a 34 year old male with massive bilateral lower extremity lymphedema and onychomycosis who presents for routine follow-up and nail care.  He has persistent, severe lymphedema of both lower extremities and receives home health compression wraps twice weekly. During the most recent wrapping, the foot was not included, resulting in a sensation of tightness in the foot. He states that the wraps should extend to the ball of the foot for optimal fit. He denies pain, open ulcers, or wounds.     Assessment & Plan: Visit Diagnoses:  1. Lymphedema   2. Venous stasis ulcer of right calf limited to breakdown of skin without varicose veins (HCC)     Plan: Assessment and Plan Assessment & Plan Massive lymphedema of both lower extremities Chronic lymphedema with reduced swelling. No ulcers. Compression therapy essential. - Continued home health compression wraps twice weekly. - Provided feedback on wrap technique for optimal coverage. - Follow-up in four weeks.  Onychomycosis of toenails Onychomycosis with thickening and discoloration. Unable to self-trim nails. - Trimmed all ten toenails during the visit.      Follow-Up Instructions: Return in about 4 weeks (around 03/25/2024).   Ortho Exam  Patient is alert, oriented, no adenopathy, well-dressed, normal affect, normal respiratory effort. Physical Exam EXTREMITIES: Decreased swelling in both lower extremities with massive lymphedema. No  open ulcers. Thickened discolored onychomycotic nails on all toes.  Nails were trimmed x 10 without complication.      Imaging: No results found. No images are attached to the encounter.  Labs: Lab Results  Component Value Date   HGBA1C 5.7 (H) 02/08/2024   HGBA1C 5.5 07/12/2023   HGBA1C 5.8 05/05/2023   REPTSTATUS 02/10/2024 FINAL 02/10/2024   REPTSTATUS 02/13/2024 FINAL 02/10/2024   GRAMSTAIN  02/10/2024    MODERATE WBC PRESENT,BOTH PMN AND MONONUCLEAR FEW GRAM POSITIVE COCCI RARE GRAM NEGATIVE RODS    CULT  02/10/2024    FEW Normal respiratory flora-no Staph aureus or Pseudomonas seen Performed at [facility], [address], [place], [place] [address]    [facility] STAPHYLOCOCCUS SIMULANS 10/28/2023   LABORGA METHICILLIN RESISTANT STAPHYLOCOCCUS AUREUS 10/28/2023     Lab Results  Component Value Date   ALBUMIN  3.4 (L) 02/09/2024   ALBUMIN  3.4 (L) 02/08/2024   ALBUMIN  3.2 (L) 10/25/2023    Lab Results  Component Value Date   MG 2.0 02/10/2024   MG 2.0 02/09/2024   MG 2.0 02/08/2024   Lab Results  Component Value Date   VD25OH 9.30 (L) 10/26/2023    No results found for: PREALBUMIN    Latest Ref Rng & Units 02/12/2024    4:28 AM 02/11/2024    3:59 AM 02/09/2024    1:02 PM  CBC EXTENDED  WBC 4.0 - 10.5 K/uL 7.2  9.7  9.2   RBC 4.22 -  5.81 MIL/uL 4.30  4.26  4.48   Hemoglobin 13.0 - 17.0 g/dL 89.9  89.9  89.6   HCT 39.0 - 52.0 % 33.2  33.9  35.7   Platelets 150 - 400 K/uL 291  312  292   NEUT# 1.7 - 7.7 K/uL  6.6    Lymph# 0.7 - 4.0 K/uL  1.7       There is no height or weight on file to calculate BMI.  Orders:  No orders of the defined types were placed in this encounter.  No orders of the defined types were placed in this encounter.    Procedures: No procedures performed  Clinical Data: No additional findings.  ROS:  All other systems negative, except as noted in the HPI. Review of Systems  Objective: Vital Signs: There were  no vitals taken for this visit.  Specialty Comments:  No specialty comments available.  PMFS History: Patient Active Problem List   Diagnosis Date Noted   Acute on chronic respiratory failure with hypoxia and hypercapnia (HCC) 02/14/2024   Acute on chronic diastolic CHF (congestive heart failure) (HCC) 02/11/2024   Acute hypoxemic respiratory failure (HCC) 02/11/2024   Class 3 severe obesity due to excess calories with serious comorbidity and body mass index (BMI) greater than or equal to 70 in adult [facility]) 02/11/2024   Community acquired pneumonia 02/11/2024   SOB (shortness of breath) 02/08/2024   Bacteremia due to Streptococcus 10/29/2023   Cellulitis of left lower extremity 10/25/2023   Morbid obesity with BMI of 60.0-69.9, adult (HCC) 10/25/2023   Hypertensive urgency 10/25/2023   Leg wound, left, initial encounter 10/25/2023   Lymphedema of lower extremity, unspecified laterality 09/03/2023   Resistant hypertension 09/03/2023   Morbid obesity (HCC) 09/03/2023   Peripheral edema 09/03/2023   Paroxysmal atrial fibrillation (HCC) 07/13/2023   New onset a-fib (HCC) 07/12/2023   Excessive daytime sleepiness 07/09/2023   Pickwickian syndrome (HCC) 07/09/2023   Sepsis (HCC) 04/06/2023   Lymphedema 04/06/2023   Influenza A 04/06/2023   Leukocytosis 04/06/2023   AKI (acute kidney injury) 04/06/2023   Primary hypertension 04/06/2023   Hypokalemia 08/11/2022   Wound infection 08/09/2022   Multiple trauma 11/10/2016   Past Medical History:  Diagnosis Date   Hypertension    not on medication   Obese    Peripheral vascular disease    edema in legs     History reviewed. No pertinent family history.  Past Surgical History:  Procedure Laterality Date   MANDIBULAR HARDWARE REMOVAL N/A 12/27/2016   Procedure: MANDIBULAR HARDWARE REMOVAL;  Surgeon: Karis Clunes, MD;  Location: [facility] OR;  Service: ENT;  Laterality: N/A;   OPEN REDUCTION INTERNAL FIXATION (ORIF) DISTAL RADIAL FRACTURE Left  11/10/2016   Procedure: OPEN REDUCTION INTERNAL FIXATION (ORIF) DISTAL RADIAL FRACTURE;  Surgeon: Sebastian Lenis, MD;  Location: [facility] OR;  Service: Orthopedics;  Laterality: Left;   ORIF MANDIBULAR FRACTURE Right 11/10/2016   Procedure: OPEN REDUCTION INTERNAL FIXATION (ORIF) MANDIBULAR FRACTURE, Ear Laceration Repair;  Surgeon: Karis Clunes, MD;  Location: [facility] OR;  Service: ENT;  Laterality: Right;   Social History   Occupational History   Not on file  Tobacco Use   Smoking status: Every Day    Current packs/day: 0.50    Average packs/day: 0.5 packs/day for 20.0 years (10.0 ttl pk-yrs)    Types: Cigarettes   Smokeless tobacco: Never   Tobacco comments:    I pack of cigarettes a day. 07/09/2023  Vaping Use   Vaping status: Never  Used  Substance and Sexual Activity   Alcohol use: Yes    Comment: 12/26/16- 1 fifth a week   Drug use: No   Sexual activity: Yes         "

## 2024-03-02 ENCOUNTER — Telehealth: Payer: Self-pay

## 2024-03-02 NOTE — Discharge Summary (Signed)
 Physician Discharge Summary  Jared Mccoy FMW:[id number] DOB: May 27, 1990 DOA: 02/08/2024  PCP: Celestia Rosaline SQUIBB, NP  Admit date: 02/08/2024 Discharge date: 03/02/2024  Time spent:45 minutes  Recommendations for Outpatient Follow-up:  Left AMA   Discharge Diagnoses:  Acute on chronic diastolic CHF, RV failure Pulmonary hypertension Acute on chronic hypoxic and hypercarbic respiratory failure   Primary hypertension   Pickwickian syndrome (HCC)   Lymphedema of lower extremity, unspecified laterality   Acute on chronic diastolic CHF (congestive heart failure) (HCC)   Class 3 severe obesity due to excess calories with serious comorbidity and body mass index (BMI) greater than or equal to 70 in adult [facility])   Community acquired pneumonia   Acute on chronic respiratory failure with hypoxia and hypercapnia (HCC)    Filed Weights   02/13/24 0500 02/14/24 0438 02/15/24 0348  Weight: (!) 260.5 kg (!) 259.9 kg (!) 246.6 kg    History of present illness:  33/M with morbid obesity, OSA,  chronic edema, concern for lymphedema presented to the ED with shortness of breath and hypoxia.  In the ED no fever or leukocytosis, CT a chest with possible multifocal pneumonia and adenopathy, enlarged main pulmonary artery, correlate for pulmonary hypertension - Admitted, started on antibiotics for multifocal pneumonia - 12/31, no improvement with antibiotics, starting diuretics  Hospital Course:   Acute on chronic D CHF, ? RV failure Pulmonary hypertension -ECHO-9/25 w/ EF 65-70, ? Normal RV, indeterminate Diastolic fxn -pt reports 100lb weight gain since taken off lasix  3mos ago (470> 588lb now) - Diuresing well, 19.8 negative volume status is difficult to assess, weight down to 543 LB today, not appropriate for right heart cath with morbid obesity -Continue Lasix  80 mg twice daily, Aldactone  ,  remains significantly volume overloaded - Appreciate cards input, started on Jardiance  12/31 - Unna  boots - Unfortunately left AMA prior to adequate diuresis   Acute hypoixic resp failure -multifactorial -PNA/CHF/OSA/OHS - Not on O2 at baseline - Continue CPAP nightly, his home machine reportedly broke, TOC consulted> assistance appreciated   Pneumonia - Did have a productive cough and concern for multifocal pneumonia on imaging, - Sputum culture is reintubated-noted few gram-positive cocci and rare gram-negative rods - Completed 5 days of ABX, antibiotics discontinued   Hypertensive urgency - Now improving - Increase hydralazine  to 75 mg TID yesterday, continue carvedilol , diuretics as above   Chronic lymphedema, chronic skin changes - Has wounds on the posterior aspect - Continue wound care, needs significant weight loss, lymphedema clinic referral - PT eval   Cocaine positive On admission   Morbid obesity BMI 79.7 -Needs follow-up in obesity clinic, eval for GLP-1 recommended   Discharge Exam: Vitals:   02/15/24 0528 02/15/24 0812  BP: (!) 166/97 (!) 161/89  Pulse:  87  Resp:  19  Temp:  97.9 F (36.6 C)  SpO2:  100%     Allergies[1]    The results of significant diagnostics from this hospitalization (including imaging, microbiology, ancillary and laboratory) are listed below for reference.    Significant Diagnostic Studies: CT Angio Chest PE W and/or Wo Contrast Result Date: 02/08/2024 EXAM: CTA CHEST 02/08/2024 02:47:02 PM TECHNIQUE: CTA of the chest was performed without and with the administration of 110 mL of iohexol  (OMNIPAQUE ) 350 MG/ML injection. Multiplanar reformatted images are provided for review. MIP images are provided for review. Automated exposure control, iterative reconstruction, and/or weight based adjustment of the mA/kV was utilized to reduce the radiation dose to as low as reasonably achievable.  COMPARISON: None available. CLINICAL HISTORY: Pulmonary embolism (PE) suspected, high prob; sob. FINDINGS: PULMONARY ARTERIES: Pulmonary arteries  are adequately opacified for evaluation. No central or segmental pulmonary embolus. Limited evaluation of the subsegmental level due to timing of contrast. The main pulmonary artery is enlarged, measuring up to 3.6 cm. MEDIASTINUM: The heart and pericardium demonstrate no acute abnormality. The thoracic aorta is normal in caliber. LYMPH NODES: Enlarged right hilar lymph node measuring up to 1.7 cm. No mediastinal or axillary lymphadenopathy. LUNGS AND PLEURA: Right lower lobe or bronchovascular and basilar hypodense consolidation. Patchy right upper lobe and right lower lobe airspace opacities. No evidence of pleural effusion or pneumothorax. UPPER ABDOMEN: Limited images of the upper abdomen are unremarkable. SOFT TISSUES AND BONES: No acute bone or soft tissue abnormality. IMPRESSION: 1. No central or segmental pulmonary embolus. Limited evaluation at the subsegmental level due to contrast timing. 2. Multifocal pneumonia of the right lower and upper lobes. Recommend follow-up CT in 3 months to evaluate for complete resolution. 3. Right hilar lymphadenopathy that may be reactive in etiology - recommend attention on follow-up. 4. Enlarged main pulmonary artery - correlate for pulmonary hypertension. Electronically signed by: Morgane Naveau MD 02/08/2024 04:37 PM EST RP Workstation: [id number]   DG Chest Portable 1 View Result Date: 02/08/2024 CLINICAL DATA:  Chest tightness and shortness of breath. EXAM: PORTABLE CHEST 1 VIEW COMPARISON:  October 25, 2023 FINDINGS: The cardiac silhouette is mildly enlarged which may be, in part, technical in origin. There is moderate severity prominence of the perihilar and hilar pulmonary vasculature. This is increased in severity when compared to the prior study. No acute infiltrate, pleural effusion or pneumothorax is identified. The visualized skeletal structures are unremarkable. IMPRESSION: Mild cardiomegaly with moderate severity pulmonary vascular congestion.  Electronically Signed   By: Suzen Dials M.D.   On: 02/08/2024 11:31    Microbiology: No results found for this or any previous visit (from the past 240 hours).   Labs: Basic Metabolic Panel: No results for input(s): NA, K, CL, CO2, GLUCOSE, BUN, CREATININE, CALCIUM, MG, PHOS in the last 168 hours. Liver Function Tests: No results for input(s): AST, ALT, ALKPHOS, BILITOT, PROT, ALBUMIN  in the last 168 hours. No results for input(s): LIPASE, AMYLASE in the last 168 hours. No results for input(s): AMMONIA in the last 168 hours. CBC: No results for input(s): WBC, NEUTROABS, HGB, HCT, MCV, PLT in the last 168 hours. Cardiac Enzymes: No results for input(s): CKTOTAL, CKMB, CKMBINDEX, TROPONINI in the last 168 hours. BNP: BNP (last 3 results) Recent Labs    06/29/23 0154 07/12/23 0816 09/03/23 0104  BNP 37.7 43.7 29.4    ProBNP (last 3 results) Recent Labs    02/08/24 0953  PROBNP 239.0    CBG: No results for input(s): GLUCAP in the last 168 hours.     Signed:  Sigurd Pac MD.  [facility] 03/02/2024, 2:19 PM       [1]  Allergies Allergen Reactions   Crab (Diagnostic) Diarrhea and Nausea And Vomiting    No issues with other shellfish - ONLY crab legs

## 2024-03-02 NOTE — Telephone Encounter (Addendum)
 I received a message that the patient's CPAP machine is broken.  I called the patient to inquire which DME company provides his CPAP and I had to leave a message requesting a call back.   Per Epic, it appears the machine was ordered from [employer]. I then called [employer]: [phone] and spoke to Tia in the [facility] office who confirmed that he received a CPAP machine in 08/2023.  I explained that the patient has reported that his CPAP machine is broken and Tia said that she would ask their CPAP department to reach out to the patient to see what is going on with the machine.

## 2024-03-03 NOTE — Telephone Encounter (Signed)
 I spoke to the patient and he said that his CPAP machine broke about 2 months ago and he called [employer]. He said they first told him to bring the machine into their office and then they told him not to come to the office because no one would be there.   I explained to him that I contacted [employer] yesterday and they said they would have a technician reach out to him about this issue.  He said he has the number for [employer] and can call them again.   I told him to please call me back with any questions and with an update on how they resolve the problem with the CPAP machine

## 2024-03-04 ENCOUNTER — Telehealth: Payer: Self-pay | Admitting: *Deleted

## 2024-03-05 ENCOUNTER — Emergency Department (HOSPITAL_COMMUNITY)

## 2024-03-05 ENCOUNTER — Inpatient Hospital Stay (HOSPITAL_COMMUNITY)
Admission: EM | Admit: 2024-03-05 | Discharge: 2024-03-09 | DRG: 291 | Disposition: A | Attending: Internal Medicine | Admitting: Internal Medicine

## 2024-03-05 ENCOUNTER — Encounter (HOSPITAL_COMMUNITY): Payer: Self-pay | Admitting: *Deleted

## 2024-03-05 ENCOUNTER — Other Ambulatory Visit: Payer: Self-pay

## 2024-03-05 DIAGNOSIS — I160 Hypertensive urgency: Secondary | ICD-10-CM | POA: Diagnosis present

## 2024-03-05 DIAGNOSIS — E876 Hypokalemia: Secondary | ICD-10-CM | POA: Diagnosis present

## 2024-03-05 DIAGNOSIS — I77810 Thoracic aortic ectasia: Secondary | ICD-10-CM | POA: Diagnosis present

## 2024-03-05 DIAGNOSIS — Z8619 Personal history of other infectious and parasitic diseases: Secondary | ICD-10-CM | POA: Diagnosis not present

## 2024-03-05 DIAGNOSIS — I10 Essential (primary) hypertension: Secondary | ICD-10-CM

## 2024-03-05 DIAGNOSIS — N1832 Chronic kidney disease, stage 3b: Secondary | ICD-10-CM | POA: Diagnosis present

## 2024-03-05 DIAGNOSIS — Z8701 Personal history of pneumonia (recurrent): Secondary | ICD-10-CM

## 2024-03-05 DIAGNOSIS — E1151 Type 2 diabetes mellitus with diabetic peripheral angiopathy without gangrene: Secondary | ICD-10-CM | POA: Diagnosis present

## 2024-03-05 DIAGNOSIS — Z59819 Housing instability, housed unspecified: Secondary | ICD-10-CM

## 2024-03-05 DIAGNOSIS — I480 Paroxysmal atrial fibrillation: Secondary | ICD-10-CM | POA: Diagnosis present

## 2024-03-05 DIAGNOSIS — F32A Depression, unspecified: Secondary | ICD-10-CM | POA: Diagnosis present

## 2024-03-05 DIAGNOSIS — I890 Lymphedema, not elsewhere classified: Secondary | ICD-10-CM | POA: Diagnosis present

## 2024-03-05 DIAGNOSIS — F17210 Nicotine dependence, cigarettes, uncomplicated: Secondary | ICD-10-CM | POA: Diagnosis present

## 2024-03-05 DIAGNOSIS — E1122 Type 2 diabetes mellitus with diabetic chronic kidney disease: Secondary | ICD-10-CM | POA: Diagnosis present

## 2024-03-05 DIAGNOSIS — Z6841 Body Mass Index (BMI) 40.0 and over, adult: Secondary | ICD-10-CM | POA: Diagnosis not present

## 2024-03-05 DIAGNOSIS — J9621 Acute and chronic respiratory failure with hypoxia: Secondary | ICD-10-CM | POA: Diagnosis present

## 2024-03-05 DIAGNOSIS — I5033 Acute on chronic diastolic (congestive) heart failure: Secondary | ICD-10-CM | POA: Diagnosis present

## 2024-03-05 DIAGNOSIS — J9622 Acute and chronic respiratory failure with hypercapnia: Secondary | ICD-10-CM | POA: Diagnosis present

## 2024-03-05 DIAGNOSIS — I130 Hypertensive heart and chronic kidney disease with heart failure and stage 1 through stage 4 chronic kidney disease, or unspecified chronic kidney disease: Principal | ICD-10-CM | POA: Diagnosis present

## 2024-03-05 DIAGNOSIS — Z91199 Patient's noncompliance with other medical treatment and regimen due to unspecified reason: Secondary | ICD-10-CM

## 2024-03-05 DIAGNOSIS — Z79899 Other long term (current) drug therapy: Secondary | ICD-10-CM | POA: Diagnosis not present

## 2024-03-05 DIAGNOSIS — Z59868 Other specified financial insecurity: Secondary | ICD-10-CM

## 2024-03-05 DIAGNOSIS — E662 Morbid (severe) obesity with alveolar hypoventilation: Secondary | ICD-10-CM | POA: Diagnosis present

## 2024-03-05 DIAGNOSIS — R0602 Shortness of breath: Secondary | ICD-10-CM | POA: Diagnosis present

## 2024-03-05 DIAGNOSIS — R739 Hyperglycemia, unspecified: Secondary | ICD-10-CM | POA: Diagnosis not present

## 2024-03-05 DIAGNOSIS — I272 Pulmonary hypertension, unspecified: Secondary | ICD-10-CM | POA: Diagnosis present

## 2024-03-05 DIAGNOSIS — F1490 Cocaine use, unspecified, uncomplicated: Secondary | ICD-10-CM | POA: Diagnosis present

## 2024-03-05 DIAGNOSIS — Z91013 Allergy to seafood: Secondary | ICD-10-CM

## 2024-03-05 DIAGNOSIS — I509 Heart failure, unspecified: Secondary | ICD-10-CM

## 2024-03-05 DIAGNOSIS — G9349 Other encephalopathy: Secondary | ICD-10-CM | POA: Diagnosis present

## 2024-03-05 LAB — COMPREHENSIVE METABOLIC PANEL WITH GFR
ALT: 15 U/L (ref 0–44)
AST: 20 U/L (ref 15–41)
Albumin: 3.7 g/dL (ref 3.5–5.0)
Alkaline Phosphatase: 78 U/L (ref 38–126)
Anion gap: 8 (ref 5–15)
BUN: 14 mg/dL (ref 6–20)
CO2: 33 mmol/L — ABNORMAL HIGH (ref 22–32)
Calcium: 8.7 mg/dL — ABNORMAL LOW (ref 8.9–10.3)
Chloride: 99 mmol/L (ref 98–111)
Creatinine, Ser: 1.16 mg/dL (ref 0.61–1.24)
GFR, Estimated: >60 mL/min
Glucose, Bld: 115 mg/dL — ABNORMAL HIGH (ref 70–99)
Potassium: 3.2 mmol/L — ABNORMAL LOW (ref 3.5–5.1)
Sodium: 140 mmol/L (ref 135–145)
Total Bilirubin: 0.4 mg/dL (ref 0.0–1.2)
Total Protein: 7.5 g/dL (ref 6.5–8.1)

## 2024-03-05 LAB — URINE DRUG SCREEN
Amphetamines: NEGATIVE
Barbiturates: NEGATIVE
Benzodiazepines: NEGATIVE
Cocaine: POSITIVE — AB
Fentanyl: NEGATIVE
Methadone Scn, Ur: NEGATIVE
Opiates: NEGATIVE
Tetrahydrocannabinol: NEGATIVE

## 2024-03-05 LAB — BASIC METABOLIC PANEL WITH GFR
Anion gap: 7 (ref 5–15)
BUN: 12 mg/dL (ref 6–20)
CO2: 36 mmol/L — ABNORMAL HIGH (ref 22–32)
Calcium: 8.8 mg/dL — ABNORMAL LOW (ref 8.9–10.3)
Chloride: 98 mmol/L (ref 98–111)
Creatinine, Ser: 1.22 mg/dL (ref 0.61–1.24)
GFR, Estimated: >60 mL/min
Glucose, Bld: 104 mg/dL — ABNORMAL HIGH (ref 70–99)
Potassium: 3.3 mmol/L — ABNORMAL LOW (ref 3.5–5.1)
Sodium: 141 mmol/L (ref 135–145)

## 2024-03-05 LAB — CBC WITH DIFFERENTIAL/PLATELET
Abs Immature Granulocytes: 0.02 K/uL (ref 0.00–0.07)
Basophils Absolute: 0 K/uL (ref 0.0–0.1)
Basophils Relative: 0 %
Eosinophils Absolute: 0.4 K/uL (ref 0.0–0.5)
Eosinophils Relative: 5 %
HCT: 35.5 % — ABNORMAL LOW (ref 39.0–52.0)
Hemoglobin: 10.5 g/dL — ABNORMAL LOW (ref 13.0–17.0)
Immature Granulocytes: 0 %
Lymphocytes Relative: 23 %
Lymphs Abs: 1.5 K/uL (ref 0.7–4.0)
MCH: 23.4 pg — ABNORMAL LOW (ref 26.0–34.0)
MCHC: 29.6 g/dL — ABNORMAL LOW (ref 30.0–36.0)
MCV: 79.1 fL — ABNORMAL LOW (ref 80.0–100.0)
Monocytes Absolute: 0.8 K/uL (ref 0.1–1.0)
Monocytes Relative: 12 %
Neutro Abs: 3.9 K/uL (ref 1.7–7.7)
Neutrophils Relative %: 60 %
Platelets: 227 K/uL (ref 150–400)
RBC: 4.49 MIL/uL (ref 4.22–5.81)
RDW: 19.2 % — ABNORMAL HIGH (ref 11.5–15.5)
WBC: 6.6 K/uL (ref 4.0–10.5)
nRBC: 0 % (ref 0.0–0.2)

## 2024-03-05 LAB — I-STAT VENOUS BLOOD GAS, ED
Acid-Base Excess: 6 mmol/L — ABNORMAL HIGH (ref 0.0–2.0)
Bicarbonate: 33.5 mmol/L — ABNORMAL HIGH (ref 20.0–28.0)
Calcium, Ion: 1.1 mmol/L — ABNORMAL LOW (ref 1.15–1.40)
HCT: 36 % — ABNORMAL LOW (ref 39.0–52.0)
Hemoglobin: 12.2 g/dL — ABNORMAL LOW (ref 13.0–17.0)
O2 Saturation: 87 %
Potassium: 3.3 mmol/L — ABNORMAL LOW (ref 3.5–5.1)
Sodium: 141 mmol/L (ref 135–145)
TCO2: 35 mmol/L — ABNORMAL HIGH (ref 22–32)
pCO2, Ven: 62.4 mmHg — ABNORMAL HIGH (ref 44–60)
pH, Ven: 7.338 (ref 7.25–7.43)
pO2, Ven: 58 mmHg — ABNORMAL HIGH (ref 32–45)

## 2024-03-05 LAB — I-STAT CHEM 8, ED
BUN: 14 mg/dL (ref 6–20)
Calcium, Ion: 1.09 mmol/L — ABNORMAL LOW (ref 1.15–1.40)
Chloride: 96 mmol/L — ABNORMAL LOW (ref 98–111)
Creatinine, Ser: 1.2 mg/dL (ref 0.61–1.24)
Glucose, Bld: 118 mg/dL — ABNORMAL HIGH (ref 70–99)
HCT: 35 % — ABNORMAL LOW (ref 39.0–52.0)
Hemoglobin: 11.9 g/dL — ABNORMAL LOW (ref 13.0–17.0)
Potassium: 3.1 mmol/L — ABNORMAL LOW (ref 3.5–5.1)
Sodium: 141 mmol/L (ref 135–145)
TCO2: 31 mmol/L (ref 22–32)

## 2024-03-05 LAB — I-STAT CG4 LACTIC ACID, ED: Lactic Acid, Venous: 0.6 mmol/L (ref 0.5–1.9)

## 2024-03-05 LAB — PRO BRAIN NATRIURETIC PEPTIDE: Pro Brain Natriuretic Peptide: 313 pg/mL — ABNORMAL HIGH

## 2024-03-05 LAB — TROPONIN T, HIGH SENSITIVITY
Troponin T High Sensitivity: 15 ng/L (ref 0–19)
Troponin T High Sensitivity: 13 ng/L (ref 0–19)

## 2024-03-05 MED ORDER — LABETALOL HCL 5 MG/ML IV SOLN
20.0000 mg | Freq: Once | INTRAVENOUS | Status: AC
  Administered 2024-03-05: 20 mg via INTRAVENOUS
  Filled 2024-03-05: qty 4

## 2024-03-05 MED ORDER — POTASSIUM CHLORIDE CRYS ER 20 MEQ PO TBCR: 40.0000 meq | EXTENDED_RELEASE_TABLET | ORAL | Status: DC

## 2024-03-05 MED ORDER — EMPAGLIFLOZIN 10 MG PO TABS
10.0000 mg | ORAL_TABLET | Freq: Every day | ORAL | Status: DC
  Administered 2024-03-05: 10 mg via ORAL
  Filled 2024-03-05: qty 1

## 2024-03-05 MED ORDER — HYDRALAZINE HCL 20 MG/ML IJ SOLN
10.0000 mg | INTRAMUSCULAR | Status: DC | PRN
  Filled 2024-03-05: qty 1

## 2024-03-05 MED ORDER — ACETAMINOPHEN 325 MG PO TABS: 650.0000 mg | ORAL_TABLET | Freq: Four times a day (QID) | ORAL | Status: DC | PRN

## 2024-03-05 MED ORDER — ACETAMINOPHEN 650 MG RE SUPP: 650.0000 mg | Freq: Four times a day (QID) | RECTAL | Status: DC | PRN

## 2024-03-05 MED ORDER — CARVEDILOL 25 MG PO TABS
25.0000 mg | ORAL_TABLET | Freq: Two times a day (BID) | ORAL | Status: DC
  Administered 2024-03-05 – 2024-03-09 (×8): 25 mg via ORAL
  Filled 2024-03-05 (×8): qty 1

## 2024-03-05 MED ORDER — POTASSIUM CHLORIDE CRYS ER 20 MEQ PO TBCR
40.0000 meq | EXTENDED_RELEASE_TABLET | ORAL | Status: AC
  Administered 2024-03-05 – 2024-03-06 (×2): 40 meq via ORAL
  Filled 2024-03-05 (×2): qty 2

## 2024-03-05 MED ORDER — HYDRALAZINE HCL 50 MG PO TABS
75.0000 mg | ORAL_TABLET | Freq: Three times a day (TID) | ORAL | Status: DC
  Administered 2024-03-05 – 2024-03-09 (×13): 75 mg via ORAL
  Filled 2024-03-05 (×13): qty 1

## 2024-03-05 MED ORDER — IPRATROPIUM-ALBUTEROL 0.5-2.5 (3) MG/3ML IN SOLN
3.0000 mL | Freq: Once | RESPIRATORY_TRACT | Status: AC
  Administered 2024-03-05: 3 mL via RESPIRATORY_TRACT
  Filled 2024-03-05: qty 3

## 2024-03-05 MED ORDER — POTASSIUM CHLORIDE CRYS ER 20 MEQ PO TBCR
40.0000 meq | EXTENDED_RELEASE_TABLET | Freq: Once | ORAL | Status: AC
  Administered 2024-03-05: 40 meq via ORAL
  Filled 2024-03-05: qty 2

## 2024-03-05 MED ORDER — FUROSEMIDE 10 MG/ML IJ SOLN
40.0000 mg | Freq: Once | INTRAMUSCULAR | Status: AC
  Administered 2024-03-05: 40 mg via INTRAVENOUS
  Filled 2024-03-05: qty 4

## 2024-03-05 MED ORDER — FUROSEMIDE 10 MG/ML IJ SOLN
60.0000 mg | Freq: Two times a day (BID) | INTRAMUSCULAR | Status: DC
  Administered 2024-03-05 – 2024-03-06 (×3): 60 mg via INTRAVENOUS
  Filled 2024-03-05 (×3): qty 6

## 2024-03-05 MED ORDER — SPIRONOLACTONE 25 MG PO TABS
50.0000 mg | ORAL_TABLET | Freq: Every day | ORAL | Status: DC
  Administered 2024-03-05 – 2024-03-09 (×5): 50 mg via ORAL
  Filled 2024-03-05 (×5): qty 2

## 2024-03-05 MED ORDER — ENOXAPARIN SODIUM 40 MG/0.4ML IJ SOSY
40.0000 mg | PREFILLED_SYRINGE | INTRAMUSCULAR | Status: DC
  Administered 2024-03-05 – 2024-03-06 (×2): 40 mg via SUBCUTANEOUS
  Filled 2024-03-05 (×2): qty 0.4

## 2024-03-05 NOTE — ED Notes (Signed)
 Sats 100 % on 3 liter [place] post breathing treatment

## 2024-03-05 NOTE — H&P (Addendum)
 " History and Physical    Patient: Jared Mccoy DOB: [birth year] DOA: 03/05/2024 DOS: the patient was seen and examined on 03/05/2024 PCP: Celestia Rosaline SQUIBB, NP  Patient coming from: Home  Chief Complaint: No chief complaint on file.  HPI: Jared Mccoy is a 34 y.o. adult with medical history significant of diastolic CHF, HTN, super morbid obesity who presented to ED with complaints of increased SOB. Pt recently left AMA on 1/4 following admission for acute heart failure exacerbation with concerns that pt was not fully diuresed at that time.  Pt is currently very drowsy and quickly falls back asleep. Difficult to arouse, but pt does wake up with stimulation. Additional detailed history is difficult given lethargy  In the ED, pt was noted to have improved PNA on CXR with evidence of pulm vascular congestion. Pt was noted to be very drowsy. ABG revealed normal pH with pCO2 of 62. Pt was given trial of IV lasix  x 1 and neb tx. Pt required 3LNC.   Review of Systems: As mentioned in the history of present illness. All other systems reviewed and are negative. Past Medical History:  Diagnosis Date   Hypertension    not on medication   Obese    Peripheral vascular disease    edema in legs    Past Surgical History:  Procedure Laterality Date   MANDIBULAR HARDWARE REMOVAL N/A 12/27/2016   Procedure: MANDIBULAR HARDWARE REMOVAL;  Surgeon: Karis Clunes, MD;  Location: [facility] OR;  Service: ENT;  Laterality: N/A;   OPEN REDUCTION INTERNAL FIXATION (ORIF) DISTAL RADIAL FRACTURE Left 11/10/2016   Procedure: OPEN REDUCTION INTERNAL FIXATION (ORIF) DISTAL RADIAL FRACTURE;  Surgeon: Sebastian Lenis, MD;  Location: [facility] OR;  Service: Orthopedics;  Laterality: Left;   ORIF MANDIBULAR FRACTURE Right 11/10/2016   Procedure: OPEN REDUCTION INTERNAL FIXATION (ORIF) MANDIBULAR FRACTURE, Ear Laceration Repair;  Surgeon: Karis Clunes, MD;  Location: [facility] OR;  Service: ENT;  Laterality: Right;   Social History:   reports that he has been smoking cigarettes. He has a 10 pack-year smoking history. He has never used smokeless tobacco. He reports current alcohol use. He reports that he does not use drugs.  Allergies[1]  History reviewed. No pertinent family history.  Prior to Admission medications  Medication Sig Start Date End Date Taking? Authorizing Provider  amLODipine  (NORVASC ) 10 MG tablet Take 1 tablet (10 mg total) by mouth daily. 11/28/23  Yes Vannie Reche RAMAN, NP  carvedilol  (COREG ) 25 MG tablet Take 1 tablet (25 mg total) by mouth 2 (two) times daily with a meal. 11/28/23  Yes Vannie Reche RAMAN, NP  furosemide  (LASIX ) 80 MG tablet Take 1 tablet (80 mg total) by mouth 2 (two) times daily. 02/15/24  Yes Fairy Frames, MD  hydrALAZINE  (APRESOLINE ) 100 MG tablet Take 1 tablet (100 mg total) by mouth every 8 (eight) hours. 11/28/23  Yes Vannie Reche RAMAN, NP  spironolactone  (ALDACTONE ) 50 MG tablet Take 1 tablet (50 mg total) by mouth daily. 02/15/24  Yes Fairy Frames, MD  acetaminophen  (TYLENOL ) 500 MG tablet Take 1,000 mg by mouth 2 (two) times daily as needed for headache or fever (pain). Patient not taking: Reported on 03/05/2024    [provider]  irbesartan  (AVAPRO ) 150 MG tablet Take 1 tablet (150 mg total) by mouth daily. Patient not taking: Reported on 03/05/2024 12/04/23   Lonni Slain, MD    Physical Exam: Vitals:   03/05/24 0342 03/05/24 0537 03/05/24 0630 03/05/24 0645  BP:  (!) 216/118 ROLLEN)  191/117 (!) 193/111  Pulse:  67 80 74  Resp:  20    Temp:      SpO2:  97% 96% 100%  Weight: (!) 244.9 kg     Height: 6' (1.829 m)      General exam: asleep, laying in bed, in nad, super morbidly obese Respiratory system: Normal respiratory effort, no wheezing Cardiovascular system: regular rate, s1, s2 Gastrointestinal system: Soft, nondistended, positive BS Central nervous system: CN2-12 grossly intact, strength intact Extremities: Perfused, no clubbing, chronic  lymphedema in BLE Skin: Normal skin turgor, no notable skin lesions seen Psychiatry: unable to assess given level of alertness  Data Reviewed:  Labs reviewed: Na 141, K 3.3, Cr 1.20, WBC 6.6, Hgb 12.2, Plts 227   Assessment and Plan: Acute on chronic D CHF Pulmonary hypertension -Recent ECHO-9/25 w/ EF 65-70,  -Pt recently left AMA for tx of heart failure on 1/4. At that time, pt had been responding well to IV diuresis, but noted to still be overloaded at the time of leaving [facility] -Cardiology had been assisting with diuresis, will re-engage again -Of note, pt was previously deemed not appropriate for right heart cath with morbid obesity -Follow I/O and daily wts   Acute hypoixic resp failure -multifactorial -PNA/CHF/OSA/OHS - Not on O2 at baseline, on 3LNC currently - Continue CPAP nightly   Recent Pneumonia - Completed abx during last stay -PNA is regressing on CXR, reviewed   Hypertensive urgency - Will cont hydralazine  75 mg TID yesterday, continue carvedilol , diuretics as per last admit -continue with PRN hydralazine    Chronic lymphedema, chronic skin changes - Has wounds on the posterior aspect - Continue wound care, needs significant weight loss, lymphedema clinic referral - PT/OT consulted  Hx cocaine abuse -every UDS done since 05/21/23 has been pos for cocaine -Will check UDS   Advance Care Planning:   Code Status: Prior Full  Consults:   Family Communication: Pt in room  Severity of Illness: The appropriate patient status for this patient is INPATIENT. Inpatient status is judged to be reasonable and necessary in order to provide the required intensity of service to ensure the patient's safety. The patient's presenting symptoms, physical exam findings, and initial radiographic and laboratory data in the context of their chronic comorbidities is felt to place them at high risk for further clinical deterioration. Furthermore, it is not anticipated that the patient  will be medically stable for discharge from the hospital within 2 midnights of admission.   * I certify that at the point of admission it is my clinical judgment that the patient will require inpatient hospital care spanning beyond 2 midnights from the point of admission due to high intensity of service, high risk for further deterioration and high frequency of surveillance required.*  Author: Garnette Pelt, MD 03/05/2024 8:35 AM  For on call review [email or web address].      [1]  Allergies Allergen Reactions   Crab (Diagnostic) Diarrhea and Nausea And Vomiting    No issues with other shellfish - ONLY crab legs   "

## 2024-03-05 NOTE — Plan of Care (Signed)
" °  Problem: Education: Goal: Knowledge of General Education information will improve Description: Including pain rating scale, medication(s)/side effects and non-pharmacologic comfort measures Outcome: Progressing   Problem: Clinical Measurements: Goal: Will remain free from infection Outcome: Progressing   Problem: Clinical Measurements: Goal: Diagnostic test results will improve Outcome: Progressing   Problem: Clinical Measurements: Goal: Respiratory complications will improve Outcome: Progressing   Problem: Clinical Measurements: Goal: Cardiovascular complication will be avoided Outcome: Progressing   Problem: Activity: Goal: Risk for activity intolerance will decrease Outcome: Progressing   Problem: Pain Managment: Goal: General experience of comfort will improve and/or be controlled Outcome: Progressing   Problem: Safety: Goal: Ability to remain free from injury will improve Outcome: Progressing   Problem: Skin Integrity: Goal: Risk for impaired skin integrity will decrease Outcome: Progressing   "

## 2024-03-05 NOTE — Progress Notes (Addendum)
 K 3.3. Will supplement another 40meq KCl q4hr x 2 doses with f/u labs ordered for AM. Need to review K/Cr trajectory in AM to help guide additional dosing tomorrow.

## 2024-03-05 NOTE — Consult Note (Addendum)
 "  Cardiology Consultation   Patient ID: Jared Mccoy MRN: [id number]; DOB: July 26, 1990  Admit date: 03/05/2024 Date of Consult: 03/05/2024  PCP:  Celestia Rosaline SQUIBB, NP   [facility] Providers Cardiologist:  Gordy Bergamo, MD        Patient Profile: Jared Mccoy is a 34 y.o. adult with a hx of HTN, chronic HFpEF, bilateral lymphedema with super morbid obesity (BMI>70), OSA, tobacco use, PAF, ongoing cocaine use, prior sepsis due to cellulitis, prior wound infections, suspected CKD 3b who is being seen 03/05/2024 for the evaluation of CHF at the request of Dr. Cindy.  History of Present Illness:  Mr. [name] has had prior admissions for infection issues with sepsis and cellulitis including maggot infestation in 08/2022. He was diagnosed with AF RVR in 07/2023. He converted to NSR on diltiazem  drip and home carvedilol  was resumed. UDS was positive for cocaine. He was not started on anticoagulation with low CHADSVASC score. He was seen by Afib clinic who referred him to general cardiology. At last OV 08/2023 he was started on Wegov[place]  with plan for 2-3 month follow-up, not completed. He has since had recurrent admissions for volume overload and multifactorial hypoxic respiratory failure with + UDS for cocaine. Last 2D echo 10/2023 was challenging due to body habitus, EF 65-70%, mildly dilated LV, moderate LVH, moderate LAE, mild RAE, mild dilation of aortic root. He was last admitted 12/28-02/15/24 for shortness of breath and hypoxia. He was treated for acute on chronic hypoxemic and hypercarbic respiratory failure, possible multifocal PNA, HFpEF, hypertensive urgency. CTA was negative for PE, study recommended f/u CT 3 months to evaluate for resolution of multifocal PNA and R hilar LAD. He ultimately left AMA prior to adequate diuresis. 15 day supply of Lasix  and 30 day supply of spironolactone  prescribed. During most of his hospitalizations he develops a rise in creatinine with diuresis so suspect  underlying CKD. He missed f/u with PCP on 1/15.    He returned to the ER with several week history of shortness of breath, noted to be lethargic and hypoxic not arriving on O2, placed on [place] O2. Presenting BP 215/107. Reported unchanged massive chronic edema. Venous ABG showed pCO2 62.4. pBNP 313, hsTrop neg x 2, lactate wnl, hypoakelmia in low 3's, Cr 1.16, Hgb 10.5-12.2 (microcytic), UDS + cocaine. CXR showed RLL consolidation probably regressed since December but not fully resolved, increased pulmonary vascular congestion, cannot exclude pulmonary interstitial edema. He was given 40mg  then 60mg  IV Lasix , 20mg  IV labetalol , nebulizer, Jardiance , hydralazine  75mg  q8hr, potassium 40meq, spironolactone  50mg , written to also resume carvedilol . He is still somewhat sleepy on exam. Can recall the names of a few of his medicines, and might have been taking some of them (? Carvedilol , amlodipine ), may have missed a dose (?hydralazine ), and otherwise ran out of Lasix  and does not recall spironolactone . He denies any CP or syncope. Per nursing he keeps taking BiPAP off. Having good UOP per patient, current jug 2/3 full at bedside. He states he doesn't have a CPAP at home. Currently a poor historian. Reports smoking tobacco, denies ETOH or illicit drug use.   Past Medical History:  Diagnosis Date   Cellulitis    Chronic heart failure with preserved ejection fraction (HFpEF) (HCC)    CKD stage 3b, GFR 30-44 ml/min (HCC)    Cocaine use    Hypertension    not on medication   Lower extremity edema    Lymphedema    Obese    OSA (  obstructive sleep apnea)    PAF (paroxysmal atrial fibrillation) (HCC)    Peripheral vascular disease    edema in legs     Past Surgical History:  Procedure Laterality Date   MANDIBULAR HARDWARE REMOVAL N/A 12/27/2016   Procedure: MANDIBULAR HARDWARE REMOVAL;  Surgeon: Karis Clunes, MD;  Location: [facility] OR;  Service: ENT;  Laterality: N/A;   OPEN REDUCTION INTERNAL FIXATION (ORIF) DISTAL  RADIAL FRACTURE Left 11/10/2016   Procedure: OPEN REDUCTION INTERNAL FIXATION (ORIF) DISTAL RADIAL FRACTURE;  Surgeon: Sebastian Lenis, MD;  Location: [facility] OR;  Service: Orthopedics;  Laterality: Left;   ORIF MANDIBULAR FRACTURE Right 11/10/2016   Procedure: OPEN REDUCTION INTERNAL FIXATION (ORIF) MANDIBULAR FRACTURE, Ear Laceration Repair;  Surgeon: Karis Clunes, MD;  Location: [facility] OR;  Service: ENT;  Laterality: Right;     Home Medications:  Prior to Admission medications  Medication Sig Start Date End Date Taking? Authorizing Provider  amLODipine  (NORVASC ) 10 MG tablet Take 1 tablet (10 mg total) by mouth daily. 11/28/23  Yes Vannie Reche RAMAN, NP  carvedilol  (COREG ) 25 MG tablet Take 1 tablet (25 mg total) by mouth 2 (two) times daily with a meal. 11/28/23  Yes Vannie Reche RAMAN, NP  furosemide  (LASIX ) 80 MG tablet Take 1 tablet (80 mg total) by mouth 2 (two) times daily. 02/15/24  Yes Fairy Frames, MD  hydrALAZINE  (APRESOLINE ) 100 MG tablet Take 1 tablet (100 mg total) by mouth every 8 (eight) hours. 11/28/23  Yes Vannie Reche RAMAN, NP  spironolactone  (ALDACTONE ) 50 MG tablet Take 1 tablet (50 mg total) by mouth daily. 02/15/24  Yes Fairy Frames, MD  acetaminophen  (TYLENOL ) 500 MG tablet Take 1,000 mg by mouth 2 (two) times daily as needed for headache or fever (pain). Patient not taking: Reported on 03/05/2024    [provider]  irbesartan  (AVAPRO ) 150 MG tablet Take 1 tablet (150 mg total) by mouth daily. Patient not taking: Reported on 03/05/2024 12/04/23   Lonni Slain, MD    Scheduled Meds:  carvedilol   25 mg Oral BID WC   empagliflozin   10 mg Oral Daily   enoxaparin  (LOVENOX ) injection  40 mg Subcutaneous Q24H   furosemide   60 mg Intravenous BID   hydrALAZINE   75 mg Oral Q8H   spironolactone   50 mg Oral Daily   Continuous Infusions:  PRN Meds: acetaminophen  **OR** acetaminophen , hydrALAZINE   Allergies:   Allergies[1]  Social History:   Social History    Socioeconomic History   Marital status: Single    Spouse name: Not on file   Number of children: Not on file   Years of education: Not on file   Highest education level: Not on file  Occupational History   Not on file  Tobacco Use   Smoking status: Every Day    Current packs/day: 0.50    Average packs/day: 0.5 packs/day for 20.0 years (10.0 ttl pk-yrs)    Types: Cigarettes   Smokeless tobacco: Never   Tobacco comments:    I pack of cigarettes a day. 07/09/2023  Vaping Use   Vaping status: Never Used  Substance and Sexual Activity   Alcohol use: Not Currently    Comment: 12/26/16- 1 fifth a week   Drug use: No   Sexual activity: Yes  Other Topics Concern   Not on file  Social History Narrative   ** Merged History Encounter **       Social Drivers of Health   Tobacco Use: High Risk (03/05/2024)   Patient History  Smoking Tobacco Use: Every Day    Smokeless Tobacco Use: Never    Passive Exposure: Not on file  Financial Resource Strain: Medium Risk (11/07/2023)   Overall Financial Resource Strain (CARDIA)    Difficulty of Paying Living Expenses: Somewhat hard  Food Insecurity: No Food Insecurity (02/09/2024)   Epic    Worried About Programme Researcher, Broadcasting/film/video in the Last Year: Never true    Ran Out of Food in the Last Year: Never true  Transportation Needs: No Transportation Needs (02/09/2024)   Epic    Lack of Transportation (Medical): No    Lack of Transportation (Non-Medical): No  Physical Activity: Not on file  Stress: Not on file  Social Connections: Socially Isolated (02/09/2024)   Social Connection and Isolation Panel    Frequency of Communication with Friends and Family: More than three times a week    Frequency of Social Gatherings with Friends and Family: More than three times a week    Attends Religious Services: Never    Database Administrator or Organizations: No    Attends Banker Meetings: Never    Marital Status: Never married  Intimate  Partner Violence: Not At Risk (02/09/2024)   Epic    Fear of Current or Ex-Partner: No    Emotionally Abused: No    Physically Abused: No    Sexually Abused: No  Depression (PHQ2-9): Low Risk (01/13/2024)   Depression (PHQ2-9)    PHQ-2 Score: 0  Alcohol Screen: Not on file  Housing: High Risk (02/09/2024)   Epic    Unable to Pay for Housing in the Last Year: Yes    Number of Times Moved in the Last Year: 2    Homeless in the Last Year: Yes  Utilities: Not At Risk (02/09/2024)   Epic    Threatened with loss of utilities: No  Health Literacy: Not on file    Family History:    Family History  Problem Relation Age of Onset   CAD Neg Hx      ROS:  Please see the history of present illness.   All other ROS reviewed and negative.     Physical Exam/Data: Vitals:   03/05/24 0900 03/05/24 1205 03/05/24 1430 03/05/24 1506  BP: (!) 196/104  (!) 175/96 (!) 118/103  Pulse: 69 68 73 75  Resp: 16 (!) 21 19 20   Temp: 98.1 F (36.7 C)   98.2 F (36.8 C)  TempSrc: Oral   Oral  SpO2: 98% 100% 100% 96%  Weight:      Height:        Intake/Output Summary (Last 24 hours) at 03/05/2024 1626 Last data filed at 03/05/2024 1430 Gross per 24 hour  Intake --  Output 3150 ml  Net -3150 ml      03/05/2024    3:42 AM 03/05/2024    3:27 AM 02/15/2024    3:48 AM  Last 3 Weights  Weight (lbs) 540 lb 150 lb 543 lb 10.5 oz  Weight (kg) 244.942 kg 68.04 kg 246.6 kg     Body mass index is 73.24 kg/m.  General: Super morbidly obese AAM in no acute distress. Head: Normocephalic, atraumatic, sclera non-icteric, no xanthomas, nares are without discharge. Neck: Negative for carotid bruits. JVP difficult to assess. Lungs: Diminished BS bilaterally without wheezes, rales, or rhonchi. Breathing is unlabored. Heart: RRR S1 S2 without murmurs, rubs, or gallops.  Abdomen: Soft, non-tender, non-distended with normoactive bowel sounds. No rebound/guarding. Extremities: No clubbing or cyanosis. Massive  BLE  edema with chronic venous stasis changes.  Neuro: Sleepy but easily arousable. Alert and oriented X 3. Moves all extremities spontaneously. Psych:  When answering, responds to questions appropriately with a normal affect.   EKG:  The EKG was personally reviewed and demonstrates:  SR 76bpm, mild nonspecific STTW changes, nonacute   Telemetry:  Telemetry was personally reviewed and demonstrates:  NSR  Relevant CV Studies: 2d echo 10/2023   1. Left ventricular ejection fraction, by estimation, is 65 to 70%. The  left ventricle has normal function. The left ventricle has no regional  wall motion abnormalities. The left ventricular internal cavity size was  mildly dilated. There is moderate  left ventricular hypertrophy. Left ventricular diastolic parameters are  indeterminate.   2. Right ventricular systolic function is normal. The right ventricular  size is normal.   3. Left atrial size was moderately dilated.   4. Right atrial size was mildly dilated.   5. The mitral valve is normal in structure. No evidence of mitral valve  regurgitation. No evidence of mitral stenosis.   6. The aortic valve is normal in structure. Aortic valve regurgitation is  not visualized. No aortic stenosis is present.   7. Aortic dilatation noted. There is mild dilatation of the aortic root,  measuring 40 mm.   8. The inferior vena cava is dilated in size with >50% respiratory  variability, suggesting right atrial pressure of 8 mmHg.   9. No clear vegetations but most valves were not well visualized. Poor  image quality.   Laboratory Data: High Sensitivity Troponin:  No results for input(s): TROPONINIHS in the last 720 hours.  Recent Labs  Lab 02/08/24 0953 03/05/24 0351 03/05/24 0738  TRNPT 15 15 13       Chemistry Recent Labs  Lab 03/05/24 0351 03/05/24 0356 03/05/24 0746  NA 140 141 141  K 3.2* 3.1* 3.3*  CL 99 96*  --   CO2 33*  --   --   GLUCOSE 115* 118*  --   BUN 14 14  --    CREATININE 1.16 1.20  --   CALCIUM 8.7*  --   --   GFRNONAA >60  --   --   ANIONGAP 8  --   --     Recent Labs  Lab 03/05/24 0351  PROT 7.5  ALBUMIN  3.7  AST 20  ALT 15  ALKPHOS 78  BILITOT 0.4   Lipids No results for input(s): CHOL, TRIG, HDL, LABVLDL, LDLCALC, CHOLHDL in the last 168 hours.  Hematology Recent Labs  Lab 03/05/24 0351 03/05/24 0356 03/05/24 0746  WBC 6.6  --   --   RBC 4.49  --   --   HGB 10.5* 11.9* 12.2*  HCT 35.5* 35.0* 36.0*  MCV 79.1*  --   --   MCH 23.4*  --   --   MCHC 29.6*  --   --   RDW 19.2*  --   --   PLT 227  --   --    Thyroid  No results for input(s): TSH, FREET4 in the last 168 hours.  BNP Recent Labs  Lab 03/05/24 0351  PROBNP 313.0*    DDimer No results for input(s): DDIMER in the last 168 hours.  Radiology/Studies:  DG Chest Port 1 View Result Date: 03/05/2024 EXAM: 1 VIEW XRAY OF THE CHEST 03/05/2024 03:46:07 AM COMPARISON: 02/08/2024 CLINICAL HISTORY: 34 year old male with shortness of breath. FINDINGS: LUNGS AND PLEURA: Low lung volumes. Increased interstitial prominence. Lower lobe  consolidation appears regressed since last month, but probably not fully resolved. No new confluent lung opacity. No pleural effusion. No pneumothorax. HEART AND MEDIASTINUM: Unchanged cardiomegaly. No acute abnormality of the mediastinal silhouette. BONES AND SOFT TISSUES: No osseous abnormality. IMPRESSION: 1. Right lower lobe consolidation probably regressed since December but not fully resolved. No new confluent lung opacity. Ongoing low lung volumes. 2. Increased pulmonary vascular congestion. Pulmonary interstitial edema not excluded. Electronically signed by: Helayne Hurst MD 03/05/2024 03:59 AM EST RP Workstation: [id number]     Assessment and Plan:   1. Acute on chronic hypercapneic/hypoxic respiratory failure in setting of super mobid obesity, obesity hypoventilation syndrome, untreated obstructive sleep apnea, and acute on  chronic HFpEF - diuresing per last admission but left AMA prior to full optimization; patient felt not appropriate for RHC given severe morbid obesity - high risk for continued rehospitalization given adherence concerns, cocaine positivity, severe morbid obesity - agree with IV Lasix  as planned - recheck BMET this afternoon to follow up potassium - OK for continuation of GDMT as outlined with carvedilol  25mg  BID, hydralazine  75mg  TID, spironolactone  50mg  and follow BP - regarding SGLT2i, need to proceed with caution given h/o chronic LE wounds, morbid obesity, risk of fungal infection with low threshold for discontinuation - needs long term follow-up with weight center  2. Encephalopathy - suspect in setting of hypercarbia, encouraged to use BiPAP - otherwise per primary team  3. Cocaine abuse - patient denies substance use aside from tobacco - UDS persistently positive - avoid selective beta blockers (carvedilol  OK per d/w MD)  4. Lone atrial fibrillation in 07/2023 - in setting of cocaine intoxication - no clear recurrence - CHADSVASC technically 2 for HTN and clinical heart failure but in setting of compliance issues and maintenance of sinus rhythm, reasonable to defer and revisit once patient more lucid  5. Mildly dilated aortic root  - seen on prior echo 10/2023 - echo probably not helpful to follow this. In setting of recent pnuemonia, a 3 month f/u CT was recommended - would defer to outpatient setting, but can review at the time this is completed to see if we can assess aortic dimension    6. CKD 3b with hypokalemia - Cr likely falsely better in setting of volume overload and being out of diuretic - anticipate some rise again this admission as his volume is treated - recheck BMET this afternoon to reassess potassium repletion needs  Risk Assessment/Risk Scores:       New [place]  Heart Association (NYHA) Functional Class NYHA Class IV  CHA2DS2-VASc Score = 2  This indicates a  0.6% annual risk of stroke. The patient's score is based upon: CHF History: 1 HTN History: 1 Diabetes History: 0 Stroke History: 0 Vascular Disease History: 0 Age Score: 0 Gender Score: 0         For questions or updates, please contact [facility] Please consult [email or web address] for contact info under      Signed, Raphael LOISE Bring, PA-C  03/05/2024 4:26 PM  ATTENDING ATTESTATION:  After conducting a review of all available clinical information with the care team, interviewing the patient, and performing a physical exam, I agree with the findings and plan described in this note with adjustments as indicated below which were discussed and enacted by staff above.   GEN: No acute distress, AO x 3 HEENT:  MMM, unable to assess JVD, no scleral icterus Cardiac: RRR, no murmurs, rubs, or gallops.  Respiratory: Decreased breath sounds GI: Soft,  nontender, non-distended  MS: Anasarca  Neuro:  Nonfocal  Vasc:  +2 radial pulses  The patient is a 34 year old male with a history of hypertension, chronic diastolic heart failure, bilateral lymphedema, BMI greater than 70, cocaine use, here with acute on chronic diastolic heart failure.  Patient has been noncompliant with medical therapy and ran out of of the Lasix  he tells me several months ago.  Agree with continuing IV Lasix  for aggressive diuresis, Coreg  25 twice daily, hydralazine  75 mg 3 times daily, spironolactone  50 mg.  Would defer SGLT2 inhibitor.  Has a history of lone atrial fibrillation but currently in normal sinus rhythm.  Patient is a poor candidate for anticoagulation.   Nayali Talerico, MD Pager [phone]      [1]  Allergies Allergen Reactions   Crab (Diagnostic) Diarrhea and Nausea And Vomiting    No issues with other shellfish - ONLY crab legs   "

## 2024-03-05 NOTE — Progress Notes (Signed)
 Pt placed on resmed vauto w/ 4 lpm o2 bleed in.

## 2024-03-05 NOTE — Progress Notes (Signed)
RT attempted ABG x2 without success

## 2024-03-05 NOTE — ED Provider Triage Note (Signed)
 Emergency Medicine Provider Triage Evaluation Note  Jared Mccoy , a 33 y.o. adult  was evaluated in triage.  Pt complains of SHOB, admitted a few weeks ago with PNA x 1 week, had to go due to child care reasons, hasn't felt right since. Did not require any further abx, has been taking lasix  x 15 days, now gone.  HTN on arrival, hypoxic, placed on [place] with improvement to 99%, BP 215/107, reports compliance with BP meds prior to tonight.  Review of Systems  Positive: Cough (productive green/yellow), SHOB, lower ext edema Negative: Fever, CP  Physical Exam  BP (!) 215/107 (BP Location: Right Arm)   Pulse 82   Temp 98.5 F (36.9 C)   Resp 14   SpO2 94%  Gen:   Awake   Resp:  Tachypenic, able to speak in complete sentences, lung sounds diminished and limited by habitus    MSK:   Moves extremities without difficulty, lymphedema bl lower ext Other:    Medical Decision Making  Medically screening exam initiated at 3:22 AM.  Appropriate orders placed.  Jared Mccoy was informed that the remainder of the evaluation will be completed by another provider, this initial triage assessment does not replace that evaluation, and the importance of remaining in the ED until their evaluation is complete.     Jared Leita LABOR, PA-C 03/05/24 [phone]

## 2024-03-05 NOTE — ED Triage Notes (Addendum)
 Patient presents to ed c/o sob onset several weeks ago , states he recently had pneumonia and today had increased sob with not being able to stay focused. . Patient is falling asleep while trying to talk to him

## 2024-03-05 NOTE — ED Notes (Signed)
 Pt o2 read 85 on RA PA and NT made aware in triage. Pt place 3L

## 2024-03-05 NOTE — ED Provider Notes (Addendum)
 " [facility] Provider Note   CSN: [id number] Arrival date & time: 03/05/24  9684     Patient presents with: No chief complaint on file.   Jared Mccoy is a 34 y.o. adult.   The history is provided by the patient.   He has history of hypertension, peripheral vascular disease, morbid obesity and comes in because of shortness of breath.  He states he has been short of breath since leaving the hospital several days ago.  This it is not worsening, he cannot tell me why he chose to come in tonight other than he is short of breath.  He does have peripheral edema and thinks his legs might be a little bit more swollen than normal.  He has had a cough productive of some clear to yellowish sputum.  He denies fever, chills, sweats.  Denies nausea or vomiting.  Of note, patient is falling asleep as I am trying to get a history from him.    Prior to Admission medications  Medication Sig Start Date End Date Taking? Authorizing Provider  acetaminophen  (TYLENOL ) 500 MG tablet Take 1,000 mg by mouth 2 (two) times daily as needed for headache or fever (pain).    [provider]  amLODipine  (NORVASC ) 10 MG tablet Take 1 tablet (10 mg total) by mouth daily. 11/28/23   Vannie Reche RAMAN, NP  carvedilol  (COREG ) 25 MG tablet Take 1 tablet (25 mg total) by mouth 2 (two) times daily with a meal. 11/28/23   Vannie Reche RAMAN, NP  furosemide  (LASIX ) 80 MG tablet Take 1 tablet (80 mg total) by mouth 2 (two) times daily. 02/15/24   Fairy Frames, MD  hydrALAZINE  (APRESOLINE ) 100 MG tablet Take 1 tablet (100 mg total) by mouth every 8 (eight) hours. 11/28/23   Vannie Reche RAMAN, NP  irbesartan  (AVAPRO ) 150 MG tablet Take 1 tablet (150 mg total) by mouth daily. 12/04/23   Lonni Slain, MD  spironolactone  (ALDACTONE ) 50 MG tablet Take 1 tablet (50 mg total) by mouth daily. 02/15/24   Fairy Frames, MD    Allergies: Crab (diagnostic)    Review of  Systems  All other systems reviewed and are negative.   Updated Vital Signs BP (!) 193/111   Pulse 74   Temp 98.5 F (36.9 C)   Resp 20   Ht 6' (1.829 m)   Wt (!) 244.9 kg   SpO2 100%   BMI 73.24 kg/m   Physical Exam Vitals and nursing note reviewed.   34 year old male, resting comfortably and in no acute distress. Vital signs are significant for elevated blood pressure. Oxygen saturation is 100%, which is normal. Head is normocephalic and atraumatic. PERRLA, EOMI. Neck is nontender and supple without adenopathy . Back is nontender.  There is no presacral edema. Lungs are clear without rales, wheezes, or rhonchi. Chest is nontender. Heart has regular rate and rhythm without murmur. Abdomen is obese, soft, flat, nontender. Extremities have 3+ edema, full range of motion is present. Skin is warm and dry without rash. Neurologic: Somnolent but arousable, moves all extremities equally.  (all labs ordered are listed, but only abnormal results are displayed) Labs Reviewed  CBC WITH DIFFERENTIAL/PLATELET - Abnormal; Notable for the following components:      Result Value   Hemoglobin 10.5 (*)    HCT 35.5 (*)    MCV 79.1 (*)    MCH 23.4 (*)    MCHC 29.6 (*)    RDW  19.2 (*)    All other components within normal limits  COMPREHENSIVE METABOLIC PANEL WITH GFR - Abnormal; Notable for the following components:   Potassium 3.2 (*)    CO2 33 (*)    Glucose, Bld 115 (*)    Calcium 8.7 (*)    All other components within normal limits  PRO BRAIN NATRIURETIC PEPTIDE - Abnormal; Notable for the following components:   Pro Brain Natriuretic Peptide 313.0 (*)    All other components within normal limits  I-STAT CHEM 8, ED - Abnormal; Notable for the following components:   Potassium 3.1 (*)    Chloride 96 (*)    Glucose, Bld 118 (*)    Calcium, Ion 1.09 (*)    Hemoglobin 11.9 (*)    HCT 35.0 (*)    All other components within normal limits  I-STAT CG4 LACTIC ACID, ED  I-STAT  ARTERIAL BLOOD GAS, ED  TROPONIN T, HIGH SENSITIVITY  TROPONIN T, HIGH SENSITIVITY    EKG: EKG Interpretation Date/Time:  Friday March 05 2024 03:32:55 EST Ventricular Rate:  76 PR Interval:  154 QRS Duration:  92 QT Interval:  392 QTC Calculation: 441 R Axis:   89  Text Interpretation: Normal sinus rhythm Normal ECG No significant change since last tracing Confirmed by Emil Share [id number]) on 03/05/2024 6:56:13 AM  Radiology: [facility] Chest Port 1 View Result Date: 03/05/2024 EXAM: 1 VIEW XRAY OF THE CHEST 03/05/2024 03:46:07 AM COMPARISON: 02/08/2024 CLINICAL HISTORY: 34 year old male with shortness of breath. FINDINGS: LUNGS AND PLEURA: Low lung volumes. Increased interstitial prominence. Lower lobe consolidation appears regressed since last month, but probably not fully resolved. No new confluent lung opacity. No pleural effusion. No pneumothorax. HEART AND MEDIASTINUM: Unchanged cardiomegaly. No acute abnormality of the mediastinal silhouette. BONES AND SOFT TISSUES: No osseous abnormality. IMPRESSION: 1. Right lower lobe consolidation probably regressed since December but not fully resolved. No new confluent lung opacity. Ongoing low lung volumes. 2. Increased pulmonary vascular congestion. Pulmonary interstitial edema not excluded. Electronically signed by: Helayne Hurst MD 03/05/2024 03:59 AM EST RP Workstation: [id number]     Procedures   Medications Ordered in the ED  potassium chloride  SA (KLOR-CON  M) CR tablet 40 mEq (has no administration in time range)  furosemide  (LASIX ) injection 40 mg (has no administration in time range)  labetalol  (NORMODYNE ) injection 20 mg (has no administration in time range)  ipratropium-albuterol  (DUONEB) 0.5-2.5 (3) MG/3ML nebulizer solution 3 mL (3 mLs Nebulization Given 03/05/24 0346)                                    Medical Decision Making Risk Prescription drug management. Decision regarding hospitalization.   Shortness of breath and  patient who is clearly fluid overloaded strongly suggestive of heart failure.  Somnolence is concerning for possible CO2 narcosis.  I reviewed his past records, and note that he left the hospital AGAINST MEDICAL ADVICE on 03/02/2024.  Diagnosis at that time was acute on chronic hypoxic and hypercarbic respiratory failure, primary hypertension, pickwickian syndrome, lymphedema, community-acquired pneumonia.  Per blood gases on record, it seems that his baseline pCO2 is about 50.  Chest x-ray shows right lower lobe consolidation which seems to improved compared with last month but not fully resolved, increased pulmonary vascular congestion and edema.  Have independently viewed the image, and agree with the radiologist's interpretation.  I have reviewed his laboratory tests, and my interpretation is hypokalemia, mildly  elevated random glucose level, elevated CO2 at about his baseline and presumably secondary to chronic respiratory acidosis, stable anemia, normal lactic acid level, mildly elevated BNP, normal troponin.  I have ordered an arterial blood gas to evaluate for possible respiratory acidosis, I have ordered intravenous furosemide  and labetalol , and oral potassium.  He will need to be admitted, but will need to get arterial blood gas results before arranging for admission.    Respiratory therapy was unable to obtain an ABG, they will try to get a venous blood gas.  I have paged the hospitalist for admission.  Case is discussed with Dr. Georgina of [facility], who agrees to admit the patient.  CRITICAL CARE Performed by: Alm Lias Total critical care time: 40 minutes Critical care time was exclusive of separately billable procedures and treating other patients. Critical care was necessary to treat or prevent imminent or life-threatening deterioration. Critical care was time spent personally by me on the following activities: development of treatment plan with patient and/or surrogate as well as  nursing, discussions with consultants, evaluation of patient's response to treatment, examination of patient, obtaining history from patient or surrogate, ordering and performing treatments and interventions, ordering and review of laboratory studies, ordering and review of radiographic studies, pulse oximetry and re-evaluation of patient's condition.     Final diagnoses:  Acute on chronic diastolic heart failure (HCC)  Poorly-controlled hypertension  Hypokalemia  Elevated random blood glucose level    ED Discharge Orders     None          Lias Alm, MD 03/05/24 9286    Lias Alm, MD 03/05/24 9244    Lias Alm, MD 03/05/24 [id number]  "

## 2024-03-06 DIAGNOSIS — R739 Hyperglycemia, unspecified: Secondary | ICD-10-CM | POA: Diagnosis not present

## 2024-03-06 DIAGNOSIS — I5033 Acute on chronic diastolic (congestive) heart failure: Secondary | ICD-10-CM | POA: Diagnosis not present

## 2024-03-06 LAB — COMPREHENSIVE METABOLIC PANEL WITH GFR
ALT: 13 U/L (ref 0–44)
AST: 18 U/L (ref 15–41)
Albumin: 3.5 g/dL (ref 3.5–5.0)
Alkaline Phosphatase: 75 U/L (ref 38–126)
Anion gap: 10 (ref 5–15)
BUN: 13 mg/dL (ref 6–20)
CO2: 32 mmol/L (ref 22–32)
Calcium: 8.7 mg/dL — ABNORMAL LOW (ref 8.9–10.3)
Chloride: 98 mmol/L (ref 98–111)
Creatinine, Ser: 1.29 mg/dL — ABNORMAL HIGH (ref 0.61–1.24)
GFR, Estimated: >60 mL/min
Glucose, Bld: 97 mg/dL (ref 70–99)
Potassium: 3.4 mmol/L — ABNORMAL LOW (ref 3.5–5.1)
Sodium: 141 mmol/L (ref 135–145)
Total Bilirubin: 0.4 mg/dL (ref 0.0–1.2)
Total Protein: 7.4 g/dL (ref 6.5–8.1)

## 2024-03-06 LAB — CBC
HCT: 36.6 % — ABNORMAL LOW (ref 39.0–52.0)
Hemoglobin: 10.7 g/dL — ABNORMAL LOW (ref 13.0–17.0)
MCH: 23.2 pg — ABNORMAL LOW (ref 26.0–34.0)
MCHC: 29.2 g/dL — ABNORMAL LOW (ref 30.0–36.0)
MCV: 79.2 fL — ABNORMAL LOW (ref 80.0–100.0)
Platelets: 233 10*3/uL (ref 150–400)
RBC: 4.62 MIL/uL (ref 4.22–5.81)
RDW: 19.2 % — ABNORMAL HIGH (ref 11.5–15.5)
WBC: 6.6 10*3/uL (ref 4.0–10.5)
nRBC: 0 % (ref 0.0–0.2)

## 2024-03-06 MED ORDER — FUROSEMIDE 10 MG/ML IJ SOLN
80.0000 mg | Freq: Two times a day (BID) | INTRAMUSCULAR | Status: DC
  Administered 2024-03-07 – 2024-03-08 (×3): 80 mg via INTRAVENOUS
  Filled 2024-03-06 (×3): qty 8

## 2024-03-06 MED ORDER — POTASSIUM CHLORIDE CRYS ER 20 MEQ PO TBCR
40.0000 meq | EXTENDED_RELEASE_TABLET | ORAL | Status: AC
  Administered 2024-03-06 – 2024-03-07 (×3): 40 meq via ORAL
  Filled 2024-03-06 (×3): qty 2

## 2024-03-06 NOTE — Evaluation (Signed)
 Physical Therapy Evaluation Patient Details Name: Jared Mccoy MRN: [id number] DOB: 1990/02/19 Today's Date: 03/06/2024  History of Present Illness  Pt is a 34 y.o. M presenting to [facility] with SOB. Pt admitted with acute on chronic CHF and pulmonary hypertension. PMH is significant for Morbid obesity, OSA, a fib, HTN, chronic LE edema due to lymphedema, +smoker, +cocaine   Clinical Impression  Prior to admittance, pt was independent with mobility and independent with ADLs. Pt presents to evaluation with deficits in mobility, power, activity tolerance, and pain. Pt was able to ambulate community level distances without AD and no physical assistance. Pt would benefit from further gait and stair training. Therapist provided education on the importance of frequent mobilization to reduce risk of bed sores and losing strength while admitted with pt verbalizing understanding and desire to continue to progress his mobility to be able to better support his family. Of note, pt has 2 sores on the posterior aspect of each calf. PT will continue to treat pt while he is admitted. Recommending OPPT at discharge to address remaining mobility deficits and optimize return to PLOF.         If plan is discharge home, recommend the following: Help with stairs or ramp for entrance;Assist for transportation   Can travel by private vehicle        Equipment Recommendations None recommended by PT  Recommendations for Other Services       Functional Status Assessment Patient has had a recent decline in their functional status and demonstrates the ability to make significant improvements in function in a reasonable and predictable amount of time.     Precautions / Restrictions Precautions Precautions: Fall Recall of Precautions/Restrictions: Intact Restrictions Weight Bearing Restrictions Per Provider Order: No      Mobility  Bed Mobility Overal bed mobility: Modified Independent             General  bed mobility comments: supine to sit on L side of bed with HOB elevated, advancing BLEs towards EOB and then using momentum to assist with supine to sit. Pt able to scoot hips forward for foot flat position without physical assistance. At end of session, pt completed sit to supine on L side of bed with HOB flat and no physical assistance. Increased time to complete    Transfers Overall transfer level: Modified independent Equipment used: None               General transfer comment: STS from EOB without AD and no physical assistance. Pt uses rocking motion and forward momentum to assist with completing stand. Increased time to complete.    Ambulation/Gait Ambulation/Gait assistance: Modified independent (Device/Increase time) Gait Distance (Feet): 225 Feet Assistive device: None Gait Pattern/deviations: Step-through pattern, Wide base of support, Decreased stride length Gait velocity: reduced Gait velocity interpretation: <1.8 ft/sec, indicate of risk for recurrent falls   General Gait Details: Pt demonstrates reciprocal gait pattern with equal WB through BLEs and reduced stride length.  Stairs            Wheelchair Mobility     Tilt Bed    Modified [name]       Balance Overall balance assessment: Needs assistance Sitting-balance support: No upper extremity supported, Feet supported Sitting balance-Leahy Scale: Good Sitting balance - Comments: seated EOB for MMT with no LOB or signs of instability   Standing balance support: No upper extremity supported, During functional activity Standing balance-Leahy Scale: Good Standing balance comment: able to shift  weight throughout gait and progressively increase gait speed with no LOB                             Pertinent Vitals/Pain Pain Assessment Pain Assessment: 0-10 Pain Score: 4  Pain Location: BLEs Pain Descriptors / Indicators: Sore, Discomfort, Grimacing Pain  Intervention(s): Limited activity within patient's tolerance, Monitored during session    Home Living Family/patient expects to be discharged to:: Private residence Living Arrangements: Parent;Children (3 daughters ages (72, 30, and 7)) Available Help at Discharge: Family;Friend(s);Available PRN/intermittently Type of Home: House Home Access: Level entry     Alternate Level Stairs-Number of Steps: flight (13 steps) Home Layout: Two level;Bed/bath upstairs Home Equipment: None      Prior Function Prior Level of Function : Independent/Modified Independent             Mobility Comments: no AD at baseline ADLs Comments: indep. takes care of his 3 daughters     Extremity/Trunk Assessment   Upper Extremity Assessment Upper Extremity Assessment: Overall WFL for tasks assessed    Lower Extremity Assessment Lower Extremity Assessment: Overall WFL for tasks assessed    Cervical / Trunk Assessment Cervical / Trunk Assessment: Other exceptions Cervical / Trunk Exceptions: body habitus  Communication   Communication Communication: No apparent difficulties    Cognition Arousal: Alert Behavior During Therapy: WFL for tasks assessed/performed   PT - Cognitive impairments: No apparent impairments                         Following commands: Intact       Cueing Cueing Techniques: Verbal cues     General Comments General comments (skin integrity, edema, etc.): pt with sores on posterior aspect of each calf. SpO2 90-94% throughout session    Exercises     Assessment/Plan    PT Assessment Patient needs continued PT services  PT Problem List Decreased activity tolerance;Decreased balance;Decreased mobility       PT Treatment Interventions Gait training;Stair training;Functional mobility training;Therapeutic activities;Therapeutic exercise;Balance training;Patient/family education;Manual techniques;Modalities    PT Goals (Current goals can be found in the Care  Plan section)  Acute Rehab PT Goals Patient Stated Goal: to get stronger to be able to take better care of his children PT Goal Formulation: With patient Time For Goal Achievement: 03/20/24 Potential to Achieve Goals: Good    Frequency Min 1X/week     Co-evaluation               AM-PAC PT 6 Clicks Mobility  Outcome Measure Help needed turning from your back to your side while in a flat bed without using bedrails?: None Help needed moving from lying on your back to sitting on the side of a flat bed without using bedrails?: None Help needed moving to and from a bed to a chair (including a wheelchair)?: None Help needed standing up from a chair using your arms (e.g., wheelchair or bedside chair)?: None Help needed to walk in hospital room?: None Help needed climbing 3-5 steps with a railing? : A Little 6 Click Score: 23    End of Session   Activity Tolerance: Patient tolerated treatment well Patient left: in bed;with call bell/phone within reach Nurse Communication: Mobility status PT Visit Diagnosis: Other abnormalities of gait and mobility (R26.89)    Time: 1349-1410 PT Time Calculation (min) (ACUTE ONLY): 21 min   Charges:   PT Evaluation $PT Eval Low Complexity:  1 Low   PT General Charges $$ ACUTE PT VISIT: 1 Visit         Leontine Hilt DPT Acute Rehab Services [phone] Prefer contact via chat   Leontine NOVAK Terrell Ostrand 03/06/2024, 3:19 PM

## 2024-03-06 NOTE — Hospital Course (Signed)
 34 y.o. adult with medical history significant of diastolic CHF, HTN, super morbid obesity who presented to ED with complaints of increased SOB. Pt recently left AMA on 1/4 following admission for acute heart failure exacerbation with concerns that pt was not fully diuresed at that time.  Pt is currently very drowsy and quickly falls back asleep. Difficult to arouse, but pt does wake up with stimulation. Additional detailed history is difficult given lethargy   In the ED, pt was noted to have improved PNA on CXR with evidence of pulm vascular congestion. Pt was noted to be very drowsy. ABG revealed normal pH with pCO2 of 62. Pt was given trial of IV lasix  x 1 and neb tx. Pt required 3LNC.

## 2024-03-06 NOTE — Progress Notes (Signed)
" °  Progress Note   Patient: Jared Mccoy DOB: 12-01-1990 DOA: 03/05/2024     1 DOS: the patient was seen and examined on 03/06/2024   Brief hospital course: 34 y.o. adult with medical history significant of diastolic CHF, HTN, super morbid obesity who presented to ED with complaints of increased SOB. Pt recently left AMA on 1/4 following admission for acute heart failure exacerbation with concerns that pt was not fully diuresed at that time.  Pt is currently very drowsy and quickly falls back asleep. Difficult to arouse, but pt does wake up with stimulation. Additional detailed history is difficult given lethargy   In the ED, pt was noted to have improved PNA on CXR with evidence of pulm vascular congestion. Pt was noted to be very drowsy. ABG revealed normal pH with pCO2 of 62. Pt was given trial of IV lasix  x 1 and neb tx. Pt required 3LNC.  Assessment and Plan: Acute on chronic D CHF Pulmonary hypertension -Recent ECHO-9/25 w/ EF 65-70,  -Pt recently left AMA for tx of heart failure on 1/4. At that time, pt had been responding well to IV diuresis, but noted to still be overloaded at the time of leaving [facility] -Cardiology following -Continuing IV lasix . Voiding well. Still remains vol overloaded -Of note, pt was previously deemed not appropriate for right heart cath with morbid obesity -Follow I/O and daily wts   Acute hypoixic resp failure -multifactorial -CHF/OSA/OHS - Not on O2 at baseline, now weaned to RA - Continue CPAP nightly   Recent Pneumonia - Completed abx during last stay -PNA is regressing on CXR   Hypertensive urgency - Will cont hydralazine  75 mg TID yesterday, continue carvedilol , diuretics as per last admit -continue with PRN hydralazine    Chronic lymphedema, chronic skin changes - Has wounds on the posterior aspect - Continue wound care, needs significant weight loss, lymphedema clinic referral - PT/OT consulted   Hx cocaine abuse -every UDS done  since 05/21/23 has been pos for cocaine -UDS remains pos for Cocaine       Subjective: Reports feeling better this AM  Physical Exam: Vitals:   03/05/24 2002 03/05/24 2325 03/06/24 0352 03/06/24 0741  BP: (!) 141/69 130/62 (!) 147/93 (!) 145/78  Pulse: 82 82 79 75  Resp: 20 19 19 20   Temp: 98.8 F (37.1 C) 98.6 F (37 C) 97.8 F (36.6 C) 97.8 F (36.6 C)  TempSrc: Oral Oral Oral Oral  SpO2: 97% 100% 97% 100%  Weight:   (!) 249.6 kg   Height:       General exam: Sitting at edge of bed, awake, in nad Respiratory system: Normal respiratory effort, no wheezing Cardiovascular system: regular rate, s1, s2 Gastrointestinal system: Soft, nondistended, positive BS Central nervous system: CN2-12 grossly intact, strength intact Extremities: Perfused, no clubbing Skin: Normal skin turgor, no notable skin lesions seen Psychiatry: Mood normal // affect seems normal  Data Reviewed:  Labs reviewed: Na 141, K 3.4, Cr 1.29, WBC 6.6, Hgb 10.7, Plts 233  Family Communication: Pt in room, family not at bedside  Disposition: Status is: Inpatient Remains inpatient appropriate because: severity of illness  Planned Discharge Destination: Home     Author: Garnette Pelt, MD 03/06/2024 12:48 PM  For on call review [email or web address].  "

## 2024-03-06 NOTE — Progress Notes (Deleted)
 OT Cancellation Note  Patient Details Name: Jared Mccoy MRN: [id number] DOB: [birth year]   Cancelled Treatment:    Reason Eval/Treat Not Completed: OT screened, no needs identified, will sign off. Per PT, pt ind. No OT needs. OT is signing off on this pt.   Mahmoud Blazejewski C, OT  Acute Rehabilitation Services Office [phone] Secure chat preferred   Adrianne GORMAN Savers 03/06/2024, 8:08 AM

## 2024-03-06 NOTE — Progress Notes (Signed)
" °   03/06/24 2200  BiPAP/CPAP/SIPAP  BiPAP/CPAP/SIPAP Pt Type Adult  BiPAP/CPAP/SIPAP Resmed  Mask Type Full face mask  Mask Size Large  Patient Home Machine No  Patient Home Mask No  Patient Home Tubing No  Auto Titrate Yes  Minimum cmH2O 5 cmH2O  Maximum cmH2O 25 cmH2O    Pt placed on CPAP with 4l bled in. VSS. "

## 2024-03-06 NOTE — Progress Notes (Signed)
 OT Cancellation Note  Patient Details Name: ANIRUDH BAIZ MRN: [id number] DOB: [birth year]   Cancelled Treatment:    Reason Eval/Treat Not Completed: OT screened, no needs identified, will sign off. Per PT, pt mod I. No OT needs. OT is signing off on this pt.   Giulianna Rocha C, OT  Acute Rehabilitation Services Office [phone] Secure chat preferred   Adrianne GORMAN Savers 03/06/2024, 2:28 PM

## 2024-03-06 NOTE — Progress Notes (Addendum)
 " Cardiology Progress Note:   Patient ID: Jared Mccoy MRN: [id number]; DOB: 1990-09-26  Admit date: 03/05/2024 Date of Consult: 03/06/2024  Primary Care Provider: Celestia Rosaline SQUIBB, NP [facility] Cardiologist: Gordy Bergamo, MD  [facility] Electrophysiologist:  None   Patient Profile:   Jared Mccoy is a 34 y.o. adult with HFpEF, HTN, super morbid obesity, PH, acute on chronic hypoxic respiratory failure, recent PNA, HTN urgency, chronic lymphedema and cocaine abuse who presents with ADHF.   Interval Events:   Lasix  60 mg IV bid yesterday with 3150 output although ins likely not representative (240 in). Hard to tell if he is much better or not as he said a lot of his symptoms were with minimal exertion he really has not milligram oxygen hospital laying in bed.  He does feel that his shortness of breath is somewhat improved. sCr 1.29 from 1.22. UDS positive for cocaine again. K 3.4. Kcl 40 mEq x3 doses ordered for this afternoon. Had 2 doses yesterday and remains hypoK with ongoing diuresis.   Past Medical History:  Diagnosis Date   Cellulitis    Chronic heart failure with preserved ejection fraction (HFpEF) (HCC)    CKD stage 3b, GFR 30-44 ml/min (HCC)    Cocaine use    Hypertension    not on medication   Lower extremity edema    Lymphedema    Obese    OSA (obstructive sleep apnea)    PAF (paroxysmal atrial fibrillation) (HCC)    Peripheral vascular disease    edema in legs    Past Surgical History:  Procedure Laterality Date   MANDIBULAR HARDWARE REMOVAL N/A 12/27/2016   Procedure: MANDIBULAR HARDWARE REMOVAL;  Surgeon: Karis Clunes, MD;  Location: [facility] OR;  Service: ENT;  Laterality: N/A;   OPEN REDUCTION INTERNAL FIXATION (ORIF) DISTAL RADIAL FRACTURE Left 11/10/2016   Procedure: OPEN REDUCTION INTERNAL FIXATION (ORIF) DISTAL RADIAL FRACTURE;  Surgeon: Sebastian Lenis, MD;  Location: [facility] OR;  Service: Orthopedics;  Laterality: Left;   ORIF MANDIBULAR FRACTURE Right  11/10/2016   Procedure: OPEN REDUCTION INTERNAL FIXATION (ORIF) MANDIBULAR FRACTURE, Ear Laceration Repair;  Surgeon: Karis Clunes, MD;  Location: [facility] OR;  Service: ENT;  Laterality: Right;    Inpatient Medications: Scheduled Meds:  carvedilol   25 mg Oral BID WC   enoxaparin  (LOVENOX ) injection  40 mg Subcutaneous Q24H   furosemide   60 mg Intravenous BID   hydrALAZINE   75 mg Oral Q8H   spironolactone   50 mg Oral Daily   Continuous Infusions:  PRN Meds: acetaminophen  **OR** acetaminophen , hydrALAZINE   Allergies:   Allergies[1]  Social History:   Social History   Socioeconomic History   Marital status: Single    Spouse name: Not on file   Number of children: Not on file   Years of education: Not on file   Highest education level: Not on file  Occupational History   Not on file  Tobacco Use   Smoking status: Every Day    Current packs/day: 0.50    Average packs/day: 0.5 packs/day for 20.0 years (10.0 ttl pk-yrs)    Types: Cigarettes   Smokeless tobacco: Never   Tobacco comments:    I pack of cigarettes a day. 07/09/2023  Vaping Use   Vaping status: Never Used  Substance and Sexual Activity   Alcohol use: Not Currently    Comment: 12/26/16- 1 fifth a week   Drug use: No   Sexual activity: Yes  Other Topics Concern   Not on file  Social History Narrative   ** Merged History Encounter **       Social Drivers of Health   Tobacco Use: High Risk (03/05/2024)   Patient History    Smoking Tobacco Use: Every Day    Smokeless Tobacco Use: Never    Passive Exposure: Not on file  Financial Resource Strain: Medium Risk (11/07/2023)   Overall Financial Resource Strain (CARDIA)    Difficulty of Paying Living Expenses: Somewhat hard  Food Insecurity: No Food Insecurity (03/05/2024)   Epic    Worried About Programme Researcher, Broadcasting/film/video in the Last Year: Never true    Ran Out of Food in the Last Year: Never true  Transportation Needs: No Transportation Needs (03/05/2024)   Epic    Lack of  Transportation (Medical): No    Lack of Transportation (Non-Medical): No  Physical Activity: Not on file  Stress: Not on file  Social Connections: Socially Isolated (02/09/2024)   Social Connection and Isolation Panel    Frequency of Communication with Friends and Family: More than three times a week    Frequency of Social Gatherings with Friends and Family: More than three times a week    Attends Religious Services: Never    Database Administrator or Organizations: No    Attends Banker Meetings: Never    Marital Status: Never married  Intimate Partner Violence: Not At Risk (03/05/2024)   Epic    Fear of Current or Ex-Partner: No    Emotionally Abused: No    Physically Abused: No    Sexually Abused: No  Depression (PHQ2-9): Low Risk (01/13/2024)   Depression (PHQ2-9)    PHQ-2 Score: 0  Alcohol Screen: Not on file  Housing: High Risk (03/05/2024)   Epic    Unable to Pay for Housing in the Last Year: No    Number of Times Moved in the Last Year: 2    Homeless in the Last Year: No  Utilities: Not At Risk (03/05/2024)   Epic    Threatened with loss of utilities: No  Health Literacy: Not on file    Family History:    Family History  Problem Relation Age of Onset   CAD Neg Hx     ROS:  Review of Systems: [y] = yes, [ ]  = no      General: Weight gain [ ] ; Weight loss [ ] ; Anorexia [ ] ; Fatigue [ ] ; Fever [ ] ; Chills [ ] ; Weakness [ ]    Cardiac: Chest pain/pressure [ ] ; Resting SOB [ ] ; Exertional SOB [y]; Orthopnea [ ] ; Pedal Edema [ ] ; Palpitations [ ] ; Syncope [ ] ; Presyncope [ ] ; Paroxysmal nocturnal dyspnea [ ]    Pulmonary: Cough [ ] ; Wheezing [ ] ; Hemoptysis [ ] ; Sputum [ ] ; Snoring [ ]    GI: Vomiting [ ] ; Dysphagia [ ] ; Melena [ ] ; Hematochezia [ ] ; Heartburn [ ] ; Abdominal pain [ ] ; Constipation [ ] ; Diarrhea [ ] ; BRBPR [ ]    GU: Hematuria [ ] ; Dysuria [ ] ; Nocturia [ ]  Vascular: Pain in legs with walking [ ] ; Pain in feet with lying flat [ ] ; Non-healing sores  [ ] ; Stroke [ ] ; TIA [ ] ; Slurred speech [ ] ;   Neuro: Headaches [ ] ; Vertigo [ ] ; Seizures [ ] ; Paresthesias [ ] ;Blurred vision [ ] ; Diplopia [ ] ; Vision changes [ ]    Ortho/Skin: Arthritis [ ] ; Joint pain [ ] ; Muscle pain [ ] ; Joint swelling [ ] ; Back Pain [ ] ; Rash [ ]   Psych: Depression [ ] ; Anxiety [ ]    Heme: Bleeding problems [ ] ; Clotting disorders [ ] ; Anemia [ ]    Endocrine: Diabetes [ ] ; Thyroid  dysfunction [ ]    Physical Exam/Data:   Vitals:   03/05/24 2325 03/06/24 0352 03/06/24 0741 03/06/24 1300  BP: 130/62 (!) 147/93 (!) 145/78 108/80  Pulse: 82 79 75 82  Resp: 19 19 20 20   Temp: 98.6 F (37 C) 97.8 F (36.6 C) 97.8 F (36.6 C) 97.7 F (36.5 C)  TempSrc: Oral Oral Oral Oral  SpO2: 100% 97% 100% 99%  Weight:  (!) 249.6 kg    Height:       Intake/Output Summary (Last 24 hours) at 03/06/2024 1426 Last data filed at 03/06/2024 0936 Gross per 24 hour  Intake 600 ml  Output 2000 ml  Net -1400 ml      03/06/2024    3:52 AM 03/05/2024    4:47 PM 03/05/2024    3:42 AM  Last 3 Weights  Weight (lbs) 550 lb 4.3 oz 554 lb 0.3 oz 540 lb  Weight (kg) 249.6 kg 251.3 kg 244.942 kg     Body mass index is 74.63 kg/m.  General: morbidly obese, conversant HEENT: normal Neck: unable to assess with morbid obesity  Cardiac:  normal S1, S2; RRR; no murmur  Lungs: clear to auscultation bilaterally, no wheezing, rhonchi or rales  Ext: chronic woody lymphedema with concomitant b/l LE edema  Musculoskeletal:  No deformities, BUE and BLE strength normal and equal Skin: warm and dry  Neuro:  CNs 2-12 intact, no focal abnormalities noted Psych:  Normal affect   ECG (03/05/24, 03:32:55) was personally reviewed and demonstrates: NSR 76, PR 154, QRS 92, QT/c 392/441 Telemetry: Telemetry was personally reviewed and demonstrates: NSR 70-90s  Relevant CV Studies:  TTE Result date: 10/27/23  1. Left ventricular ejection fraction, by estimation, is 65 to 70%. The  left ventricle has  normal function. The left ventricle has no regional  wall motion abnormalities. The left ventricular internal cavity size was  mildly dilated. There is moderate  left ventricular hypertrophy. Left ventricular diastolic parameters are  indeterminate.   2. Right ventricular systolic function is normal. The right ventricular  size is normal.   3. Left atrial size was moderately dilated.   4. Right atrial size was mildly dilated.   5. The mitral valve is normal in structure. No evidence of mitral valve  regurgitation. No evidence of mitral stenosis.   6. The aortic valve is normal in structure. Aortic valve regurgitation is  not visualized. No aortic stenosis is present.   7. Aortic dilatation noted. There is mild dilatation of the aortic root,  measuring 40 mm.   8. The inferior vena cava is dilated in size with >50% respiratory  variability, suggesting right atrial pressure of 8 mmHg.   9. No clear vegetations but most valves were not well visualized. Poor  image quality.   Laboratory Data:  Chemistry Recent Labs  Lab 03/05/24 0351 03/05/24 0356 03/05/24 0746 03/05/24 1657 03/06/24 0225  NA 140 141 141 141 141  K 3.2* 3.1* 3.3* 3.3* 3.4*  CL 99 96*  --  98 98  CO2 33*  --   --  36* 32  GLUCOSE 115* 118*  --  104* 97  BUN 14 14  --  12 13  CREATININE 1.16 1.20  --  1.22 1.29*  CALCIUM 8.7*  --   --  8.8* 8.7*  GFRNONAA >60  --   --  >  60 >60  ANIONGAP 8  --   --  7 10    Recent Labs  Lab 03/05/24 0351 03/06/24 0225  PROT 7.5 7.4  ALBUMIN  3.7 3.5  AST 20 18  ALT 15 13  ALKPHOS 78 75  BILITOT 0.4 0.4   Hematology Recent Labs  Lab 03/05/24 0351 03/05/24 0356 03/05/24 0746 03/06/24 0225  WBC 6.6  --   --  6.6  RBC 4.49  --   --  4.62  HGB 10.5* 11.9* 12.2* 10.7*  HCT 35.5* 35.0* 36.0* 36.6*  MCV 79.1*  --   --  79.2*  MCH 23.4*  --   --  23.2*  MCHC 29.6*  --   --  29.2*  RDW 19.2*  --   --  19.2*  PLT 227  --   --  233   BNP Recent Labs  Lab  03/05/24 0351  PROBNP 313.0*    Radiology/Studies:  DG Chest Port 1 View Result Date: 03/05/2024 EXAM: 1 VIEW XRAY OF THE CHEST 03/05/2024 03:46:07 AM COMPARISON: 02/08/2024 CLINICAL HISTORY: 34 year old male with shortness of breath. FINDINGS: LUNGS AND PLEURA: Low lung volumes. Increased interstitial prominence. Lower lobe consolidation appears regressed since last month, but probably not fully resolved. No new confluent lung opacity. No pleural effusion. No pneumothorax. HEART AND MEDIASTINUM: Unchanged cardiomegaly. No acute abnormality of the mediastinal silhouette. BONES AND SOFT TISSUES: No osseous abnormality. IMPRESSION: 1. Right lower lobe consolidation probably regressed since December but not fully resolved. No new confluent lung opacity. Ongoing low lung volumes. 2. Increased pulmonary vascular congestion. Pulmonary interstitial edema not excluded. Electronically signed by: Helayne Hurst MD 03/05/2024 03:59 AM EST RP Workstation: [id number]   Assessment and Plan:  Jared Mccoy is a 34 y.o. adult with HFpEF, HTN, super morbid obesity, PH, acute on chronic hypoxic respiratory failure, recent PNA, HTN urgency, chronic lymphedema and cocaine abuse who presents with ADHF.   ADHF (acute on chronic HFpEF)  HFpEF Morbid obesity CKD3b Medication noncompliance Adequate diuresis yesterday with lasix  60 mg IV bid. Will increase to lasix  80 mg IV bid. Ordered 40 mEq x3 (120 total) this afternoon. Had 40 mEq yesterday at 1811 and this morning at 0030 but remains low. Will have repeat labs in the morning.  I suspect that he likely will leave AMA again as he does not seem to want to stay for ongoing diuresis.  He said that he will stay tonight but really wants to go home on oral medications.  He feels that his issue is only related to running out of his oral Lasix  over a year ago.  Dry weight is difficult to quantify obesity of morbid obesity and physical exam also extremely difficult to assess with  chronic lymphedema and morbid obesity.  When he gets closer to discharge I would plan for Lasix  80 mg PO daily with close f/u.   For questions or updates, please contact [facility] Please consult [email or web address] for contact info under   Signed, Donnice DELENA Primus, MD  03/06/2024 2:26 PM     [1]  Allergies Allergen Reactions   Crab (Diagnostic) Diarrhea and Nausea And Vomiting    No issues with other shellfish - ONLY crab legs   "

## 2024-03-07 DIAGNOSIS — E876 Hypokalemia: Secondary | ICD-10-CM | POA: Diagnosis not present

## 2024-03-07 DIAGNOSIS — R739 Hyperglycemia, unspecified: Secondary | ICD-10-CM | POA: Diagnosis not present

## 2024-03-07 DIAGNOSIS — I5033 Acute on chronic diastolic (congestive) heart failure: Secondary | ICD-10-CM | POA: Diagnosis not present

## 2024-03-07 LAB — BASIC METABOLIC PANEL WITH GFR
Anion gap: 7 (ref 5–15)
BUN: 17 mg/dL (ref 6–20)
CO2: 34 mmol/L — ABNORMAL HIGH (ref 22–32)
Calcium: 8.9 mg/dL (ref 8.9–10.3)
Chloride: 100 mmol/L (ref 98–111)
Creatinine, Ser: 1.34 mg/dL — ABNORMAL HIGH (ref 0.61–1.24)
GFR, Estimated: >60 mL/min
Glucose, Bld: 93 mg/dL (ref 70–99)
Potassium: 3.6 mmol/L (ref 3.5–5.1)
Sodium: 141 mmol/L (ref 135–145)

## 2024-03-07 LAB — MAGNESIUM: Magnesium: 2 mg/dL (ref 1.7–2.4)

## 2024-03-07 MED ORDER — POTASSIUM CHLORIDE CRYS ER 20 MEQ PO TBCR
40.0000 meq | EXTENDED_RELEASE_TABLET | Freq: Two times a day (BID) | ORAL | Status: DC
  Administered 2024-03-07 – 2024-03-08 (×3): 40 meq via ORAL
  Filled 2024-03-07 (×3): qty 2

## 2024-03-07 MED ORDER — ENOXAPARIN SODIUM 150 MG/ML IJ SOSY
0.5000 mg/kg | PREFILLED_SYRINGE | INTRAMUSCULAR | Status: DC
  Administered 2024-03-07: 126 mg via SUBCUTANEOUS
  Filled 2024-03-07 (×2): qty 0.84

## 2024-03-07 MED ORDER — POTASSIUM CHLORIDE CRYS ER 20 MEQ PO TBCR: 40.0000 meq | EXTENDED_RELEASE_TABLET | Freq: Two times a day (BID) | ORAL | Status: DC

## 2024-03-07 NOTE — Progress Notes (Signed)
 " Cardiology Consultation:   Patient ID: Jared Mccoy MRN: [id number]; DOB: 09-23-1990  Admit date: 03/05/2024 Date of Consult: 03/07/2024  Primary Care Provider: Celestia Rosaline SQUIBB, NP [facility] Cardiologist: Gordy Bergamo, MD  [facility] Electrophysiologist:  None   Patient Profile:   Jared Mccoy is a 34 y.o. adult with HFpEF, HTN, super morbid obesity, PH, acute on chronic hypoxic respiratory failure, recent PNA, HTN urgency, chronic lymphedema and cocaine abuse who presents with ADHF.   Interval Events:  Lasix  60 mg IV once yesterday with inadequate UOP, increased to lasix  80 mg IV bid today. sCr stable yesterday. K low, started on standing 40 mEq bid. Walked down the hall yesterday with minimal dyspnea. On phone this morning watching videos.   Past Medical History:  Diagnosis Date   Cellulitis    Chronic heart failure with preserved ejection fraction (HFpEF) (HCC)    CKD stage 3b, GFR 30-44 ml/min (HCC)    Cocaine use    Hypertension    not on medication   Lower extremity edema    Lymphedema    Obese    OSA (obstructive sleep apnea)    PAF (paroxysmal atrial fibrillation) (HCC)    Peripheral vascular disease    edema in legs    Past Surgical History:  Procedure Laterality Date   MANDIBULAR HARDWARE REMOVAL N/A 12/27/2016   Procedure: MANDIBULAR HARDWARE REMOVAL;  Surgeon: Karis Clunes, MD;  Location: [facility] OR;  Service: ENT;  Laterality: N/A;   OPEN REDUCTION INTERNAL FIXATION (ORIF) DISTAL RADIAL FRACTURE Left 11/10/2016   Procedure: OPEN REDUCTION INTERNAL FIXATION (ORIF) DISTAL RADIAL FRACTURE;  Surgeon: Sebastian Lenis, MD;  Location: [facility] OR;  Service: Orthopedics;  Laterality: Left;   ORIF MANDIBULAR FRACTURE Right 11/10/2016   Procedure: OPEN REDUCTION INTERNAL FIXATION (ORIF) MANDIBULAR FRACTURE, Ear Laceration Repair;  Surgeon: Karis Clunes, MD;  Location: [facility] OR;  Service: ENT;  Laterality: Right;    Inpatient Medications: Scheduled Meds:  carvedilol   25 mg Oral  BID WC   enoxaparin  (LOVENOX ) injection  40 mg Subcutaneous Q24H   furosemide   80 mg Intravenous BID   hydrALAZINE   75 mg Oral Q8H   potassium chloride   40 mEq Oral BID   spironolactone   50 mg Oral Daily   Continuous Infusions:  PRN Meds: acetaminophen  **OR** acetaminophen , hydrALAZINE   Allergies:   Allergies[1]  Social History:   Social History   Socioeconomic History   Marital status: Single    Spouse name: Not on file   Number of children: Not on file   Years of education: Not on file   Highest education level: Not on file  Occupational History   Not on file  Tobacco Use   Smoking status: Every Day    Current packs/day: 0.50    Average packs/day: 0.5 packs/day for 20.0 years (10.0 ttl pk-yrs)    Types: Cigarettes   Smokeless tobacco: Never   Tobacco comments:    I pack of cigarettes a day. 07/09/2023  Vaping Use   Vaping status: Never Used  Substance and Sexual Activity   Alcohol use: Not Currently    Comment: 12/26/16- 1 fifth a week   Drug use: No   Sexual activity: Yes  Other Topics Concern   Not on file  Social History Narrative   ** Merged History Encounter **       Social Drivers of Health   Tobacco Use: High Risk (03/05/2024)   Patient History    Smoking Tobacco Use: Every Day  Smokeless Tobacco Use: Never    Passive Exposure: Not on file  Financial Resource Strain: Medium Risk (11/07/2023)   Overall Financial Resource Strain (CARDIA)    Difficulty of Paying Living Expenses: Somewhat hard  Food Insecurity: No Food Insecurity (03/05/2024)   Epic    Worried About Programme Researcher, Broadcasting/film/video in the Last Year: Never true    Ran Out of Food in the Last Year: Never true  Transportation Needs: No Transportation Needs (03/05/2024)   Epic    Lack of Transportation (Medical): No    Lack of Transportation (Non-Medical): No  Physical Activity: Not on file  Stress: Not on file  Social Connections: Socially Isolated (02/09/2024)   Social Connection and Isolation  Panel    Frequency of Communication with Friends and Family: More than three times a week    Frequency of Social Gatherings with Friends and Family: More than three times a week    Attends Religious Services: Never    Database Administrator or Organizations: No    Attends Banker Meetings: Never    Marital Status: Never married  Intimate Partner Violence: Not At Risk (03/05/2024)   Epic    Fear of Current or Ex-Partner: No    Emotionally Abused: No    Physically Abused: No    Sexually Abused: No  Depression (PHQ2-9): Low Risk (01/13/2024)   Depression (PHQ2-9)    PHQ-2 Score: 0  Alcohol Screen: Not on file  Housing: High Risk (03/05/2024)   Epic    Unable to Pay for Housing in the Last Year: No    Number of Times Moved in the Last Year: 2    Homeless in the Last Year: No  Utilities: Not At Risk (03/05/2024)   Epic    Threatened with loss of utilities: No  Health Literacy: Not on file    Family History:    Family History  Problem Relation Age of Onset   CAD Neg Hx     ROS:  Review of Systems: [y] = yes, [ ]  = no      General: Weight gain [ ] ; Weight loss [ ] ; Anorexia [ ] ; Fatigue [ ] ; Fever [ ] ; Chills [ ] ; Weakness [ ]    Cardiac: Chest pain/pressure [ ] ; Resting SOB [ ] ; Exertional SOB [ ] ; Orthopnea [ ] ; Pedal Edema [ ] ; Palpitations [ ] ; Syncope [ ] ; Presyncope [ ] ; Paroxysmal nocturnal dyspnea [ ]    Pulmonary: Cough [ ] ; Wheezing [ ] ; Hemoptysis [ ] ; Sputum [ ] ; Snoring [ ]    GI: Vomiting [ ] ; Dysphagia [ ] ; Melena [ ] ; Hematochezia [ ] ; Heartburn [ ] ; Abdominal pain [ ] ; Constipation [ ] ; Diarrhea [ ] ; BRBPR [ ]    GU: Hematuria [ ] ; Dysuria [ ] ; Nocturia [ ]  Vascular: Pain in legs with walking [ ] ; Pain in feet with lying flat [ ] ; Non-healing sores [ ] ; Stroke [ ] ; TIA [ ] ; Slurred speech [ ] ;   Neuro: Headaches [ ] ; Vertigo [ ] ; Seizures [ ] ; Paresthesias [ ] ;Blurred vision [ ] ; Diplopia [ ] ; Vision changes [ ]    Ortho/Skin: Arthritis [ ] ; Joint pain [ ] ;  Muscle pain [ ] ; Joint swelling [ ] ; Back Pain [ ] ; Rash [ ]    Psych: Depression [ ] ; Anxiety [ ]    Heme: Bleeding problems [ ] ; Clotting disorders [ ] ; Anemia [ ]    Endocrine: Diabetes [ ] ; Thyroid  dysfunction [ ]    Physical Exam/Data:  Vitals:   03/06/24 2200 03/06/24 2300 03/07/24 0416 03/07/24 0756  BP:  129/74 (!) 145/77 (!) 155/99  Pulse: 78 79 75 77  Resp: (!) 22 20 20 19   Temp:  98.4 F (36.9 C) 98.2 F (36.8 C) 97.9 F (36.6 C)  TempSrc:  Oral Oral Oral  SpO2: 99% 97% 93% 93%  Weight:   (!) 249.5 kg   Height:        Intake/Output Summary (Last 24 hours) at 03/07/2024 0803 Last data filed at 03/07/2024 0415 Gross per 24 hour  Intake 1350 ml  Output 750 ml  Net 600 ml      03/07/2024    4:16 AM 03/06/2024    3:52 AM 03/05/2024    4:47 PM  Last 3 Weights  Weight (lbs) 550 lb 0.8 oz 550 lb 4.3 oz 554 lb 0.3 oz  Weight (kg) 249.5 kg 249.6 kg 251.3 kg     Body mass index is 74.6 kg/m.  General: morbidly obese, conversant HEENT: normal Neck: unable to assess with morbid obesity  Cardiac:  normal S1, S2; RRR; no murmur  Lungs: clear to auscultation bilaterally, no wheezing, rhonchi or rales  Ext: chronic woody lymphedema with concomitant b/l LE edema  Musculoskeletal:  No deformities, BUE and BLE strength normal and equal Skin: warm and dry  Neuro:  CNs 2-12 intact, no focal abnormalities noted Psych:  Normal affect   ECG (03/05/24, 03:32:55) was personally reviewed and demonstrates: NSR 76, PR 154, QRS 92, QT/c 392/441 Telemetry: Telemetry was personally reviewed and demonstrates: NSR 70-90s   Relevant CV Studies: TTE Result date: 10/27/23  1. Left ventricular ejection fraction, by estimation, is 65 to 70%. The  left ventricle has normal function. The left ventricle has no regional  wall motion abnormalities. The left ventricular internal cavity size was  mildly dilated. There is moderate  left ventricular hypertrophy. Left ventricular diastolic parameters  are  indeterminate.   2. Right ventricular systolic function is normal. The right ventricular  size is normal.   3. Left atrial size was moderately dilated.   4. Right atrial size was mildly dilated.   5. The mitral valve is normal in structure. No evidence of mitral valve  regurgitation. No evidence of mitral stenosis.   6. The aortic valve is normal in structure. Aortic valve regurgitation is  not visualized. No aortic stenosis is present.   7. Aortic dilatation noted. There is mild dilatation of the aortic root,  measuring 40 mm.   8. The inferior vena cava is dilated in size with >50% respiratory  variability, suggesting right atrial pressure of 8 mmHg.   9. No clear vegetations but most valves were not well visualized. Poor  image quality.   Laboratory Data:  Chemistry Recent Labs  Lab 03/05/24 1657 03/06/24 0225 03/07/24 0215  NA 141 141 141  K 3.3* 3.4* 3.6  CL 98 98 100  CO2 36* 32 34*  GLUCOSE 104* 97 93  BUN 12 13 17   CREATININE 1.22 1.29* 1.34*  CALCIUM 8.8* 8.7* 8.9  GFRNONAA >60 >60 >60  ANIONGAP 7 10 7     Recent Labs  Lab 03/05/24 0351 03/06/24 0225  PROT 7.5 7.4  ALBUMIN  3.7 3.5  AST 20 18  ALT 15 13  ALKPHOS 78 75  BILITOT 0.4 0.4   Hematology Recent Labs  Lab 03/05/24 0351 03/05/24 0356 03/05/24 0746 03/06/24 0225  WBC 6.6  --   --  6.6  RBC 4.49  --   --  4.62  HGB 10.5* 11.9* 12.2* 10.7*  HCT 35.5* 35.0* 36.0* 36.6*  MCV 79.1*  --   --  79.2*  MCH 23.4*  --   --  23.2*  MCHC 29.6*  --   --  29.2*  RDW 19.2*  --   --  19.2*  PLT 227  --   --  233   BNP Recent Labs  Lab 03/05/24 0351  PROBNP 313.0*    Radiology/Studies:  DG Chest Port 1 View Result Date: 03/05/2024 EXAM: 1 VIEW XRAY OF THE CHEST 03/05/2024 03:46:07 AM COMPARISON: 02/08/2024 CLINICAL HISTORY: 34 year old male with shortness of breath. FINDINGS: LUNGS AND PLEURA: Low lung volumes. Increased interstitial prominence. Lower lobe consolidation appears regressed since  last month, but probably not fully resolved. No new confluent lung opacity. No pleural effusion. No pneumothorax. HEART AND MEDIASTINUM: Unchanged cardiomegaly. No acute abnormality of the mediastinal silhouette. BONES AND SOFT TISSUES: No osseous abnormality. IMPRESSION: 1. Right lower lobe consolidation probably regressed since December but not fully resolved. No new confluent lung opacity. Ongoing low lung volumes. 2. Increased pulmonary vascular congestion. Pulmonary interstitial edema not excluded. Electronically signed by: Helayne Hurst MD 03/05/2024 03:59 AM EST RP Workstation: [id number]   Assessment and Plan:  ELEUTERIO DOLLAR is a 34 y.o. adult with HFpEF, HTN, super morbid obesity, PH, acute on chronic hypoxic respiratory failure, recent PNA, HTN urgency, chronic lymphedema and cocaine abuse who presents with ADHF.    ADHF (acute on chronic HFpEF)  HFpEF Morbid obesity CKD3b Medication noncompliance Inadequate diuresis yesterday with lasix  60 mg IV x1, increased to lasix  80 mg IV bid today. Goal net negative 2 L. Ordered K 40 mEq bid standing for supplementation. Was able to walk down the hall today without significant dyspnea. Continue diuresis. Dry weight is difficult to quantify obesity of morbid obesity and physical exam also extremely difficult to assess with chronic lymphedema and morbid obesity.  When he gets closer to discharge I would plan for Lasix  80 mg PO daily (vs bid) with close f/u.   Signed, Donnice DELENA Primus, MD  03/07/2024 8:03 AM     [1]  Allergies Allergen Reactions   Crab (Diagnostic) Diarrhea and Nausea And Vomiting    No issues with other shellfish - ONLY crab legs   "

## 2024-03-07 NOTE — Progress Notes (Signed)
 Pt is alert and fully oriented x 4, able to ambulate independently in his room. He is afebrile, stable hemodynamically, NSR on the monitor, on CPAP at night time, normal respiratory effort, no acute distress. Pt is able to rest and sleep well with no major complaints overnight. Plan of care is reviewed. Pt has been progressing. We will continue monitor.  Wendi Dash, RN

## 2024-03-07 NOTE — Care Management (Signed)
 SDOH resources added to AVS

## 2024-03-07 NOTE — Progress Notes (Signed)
" °  Progress Note   Patient: Jared Mccoy FMW:[id number] DOB: 02-29-1990 DOA: 03/05/2024     2 DOS: the patient was seen and examined on 03/07/2024   Brief hospital course: 34 y.o. adult with medical history significant of diastolic CHF, HTN, super morbid obesity who presented to ED with complaints of increased SOB. Pt recently left AMA on 1/4 following admission for acute heart failure exacerbation with concerns that pt was not fully diuresed at that time.  Pt is currently very drowsy and quickly falls back asleep. Difficult to arouse, but pt does wake up with stimulation. Additional detailed history is difficult given lethargy   In the ED, pt was noted to have improved PNA on CXR with evidence of pulm vascular congestion. Pt was noted to be very drowsy. ABG revealed normal pH with pCO2 of 62. Pt was given trial of IV lasix  x 1 and neb tx. Pt required 3LNC.  Assessment and Plan: Acute on chronic D CHF Pulmonary hypertension -Recent ECHO-9/25 w/ EF 65-70,  -Pt recently left AMA for tx of heart failure on 1/4. At that time, pt had been responding well to IV diuresis, but noted to still be overloaded at the time of leaving [facility] -Cardiology following -Continuing IV lasix . Lasix  dose increased to 80mg  bid per Cardiology. When fully diuresed, plan to transition to PO lasix  80mg  either bid or daily -Of note, pt was previously deemed not appropriate for right heart cath with morbid obesity -Follow I/O and daily wts   Acute hypoixic resp failure -multifactorial -CHF/OSA/OHS - Not on O2 at baseline, now weaned to RA - Continue CPAP nightly   Recent Pneumonia - Completed abx during last stay -PNA is regressing on admission  CXR   Hypertensive urgency - Will cont hydralazine  75 mg TID yesterday, continue carvedilol , diuretics as per last admit -continue with PRN hydralazine    Chronic lymphedema, chronic skin changes - Has wounds on the posterior aspect - Continue wound care, needs significant  weight loss, lymphedema clinic referral - PT/OT consulted   Hx cocaine abuse -every UDS done since 05/21/23 has been pos for cocaine -UDS remains pos for Cocaine -TOC was consulted       Subjective: No complaints this AM. Still voiding well  Physical Exam: Vitals:   03/06/24 2300 03/07/24 0416 03/07/24 0756 03/07/24 1159  BP: 129/74 (!) 145/77 (!) 155/99 138/77  Pulse: 79 75 77 68  Resp: 20 20 19 19   Temp: 98.4 F (36.9 C) 98.2 F (36.8 C) 97.9 F (36.6 C) 97.9 F (36.6 C)  TempSrc: Oral Oral Oral Oral  SpO2: 97% 93% 93% 95%  Weight:  (!) 249.5 kg    Height:       General exam: Conversant, in no acute distress Respiratory system: normal chest rise, clear, no audible wheezing Cardiovascular system: regular rhythm, s1-s2 Gastrointestinal system: Nondistended, nontender, pos BS Central nervous system: No seizures, no tremors Extremities: No cyanosis, no joint deformities Skin: No rashes, no pallor Psychiatry: Affect normal // no auditory hallucinations   Data Reviewed:  Labs reviewed: Na 141, K 3.6, Cr 1.34, Mg 2.0  Family Communication: Pt in room, family not at bedside  Disposition: Status is: Inpatient Remains inpatient appropriate because: severity of illness  Planned Discharge Destination: Home     Author: Garnette Pelt, MD 03/07/2024 12:52 PM  For on call review [email or web address].  "

## 2024-03-07 NOTE — Discharge Instructions (Signed)
 [place] [employer] Crisis Assistance Programs -[facility] Homelessness Coordinated Entry Program. If you are experiencing homelessness in [place], [place] , your first point of contact should be Pensions Consultant. You can reach Coordinated Entry by calling [phone]) [phone] or by emailing [email or web address].  Community access points: [employer] [phone], [place]) every Tuesday from 9am-10am. [facility] ([address]. [address] [place]) every Wednesday from 8am-9am.   -The [employer] 641-305-2838) offers several services to local families, as funding allows. The Emergency Assistance Program (EAP), which they administer, provides household goods, free food, clothing, and financial aid to people in need in the [place]  area. The EAP program does have some qualification, and counselors will interview clients for financial assistance by written referral only. Referrals need to be made by the Department of Social Services or by other EAP approved human services agencies or charities in the area.  -[facility] of [employer], which can be reached at ([phone] [phone], offers emergency assistance programs for those in need of help, such as food, rent assistance, a soup kitchen, shelter, and clothing. They are based in [facility]  but provide a number of services to those that qualify for assistance.   [facility] Department of Social Services may be able to offer temporary financial assistance and cash grants for paying rent and utilities, Help may be provided for local county residents who may be experiencing personal crisis when other resources, including government programs, are not available. Call [phone]  -High [employer] is a [employer] agency, The organization can offer emergency assistance for paying rent, caremark rx, utilities, food,  household products and furniture. They offer extensive emergency and transitional housing for families, children and single women, and also run a Boy's and [employer]. Thrift Shops, Secondary School Teacher, and other aid offered too. [address] [place], [place]  [address], [phone]  -[place] Low Income Energy Assistance Program -- This is offered for [facility] families. The federal government created [employer] Program provides a one-time cash grant payment to help eligible low-income families pay their electric and heating bills. [address], [place], [place]  [address], [phone]  -[place] Emergency Assistance -- A program offers emergency utility and rent funds for greater [place] area residents. The program can also provide counseling and referrals to charities and government programs. Also provides food and a free meal program that serves lunch Mondays - Saturdays and dinner seven days per week to individuals in the community. [address], [place], [place]  [address], [phone]  -[employer] - Offers affordable apartment and housing communities across      [place] and [place]. The low income and seniors can access public housing, rental assistance to qualified applicants, and apply for the section 8 rent subsidy program. Other programs include Chiropractor and Engineer, Maintenance. [address], [place], [place]  [address], dial [phone].  -The [facility] provides transitional housing to veterans and the disabled. Clients will also access other services too, including assistance in applying for Disability, life skills classes, case management, and assistance in finding permanent housing. [address], [place], [place]  [address], call [phone]  -[place] Transitional Housing through [employer] is for  people who were just evicted or that are formerly homeless. The non-profit will also help then gain self-sufficiency, find a home or apartment to live  in, and also provides information on rent assistance when needed. Phone [phone]  -The [place] [facility] helps low income, elderly, or disabled residents in seven counties in the [place] ([place], [place], [place], [place], [place], [place], [place], and [place]) save energy and reduce their utility bills by improving energy efficiency. Phone [phone].  -[employer] is located in the [place] [facility] in the [employer], [address], [place], [place] [address]. Parking is in the rear of the building. Phone: [phone]   General Email: info@gsohc .org  GHC provides free housing counseling assistance in locating affordable rental housing or housing with support services for families and individuals in crisis and the chronically homeless. We provide potential resources for other housing needs like utilities. Our trained counselors also work with clients on budgeting and financial literacy in effort to empower them to take control of their financial situations. [employer] collaborates with homeless service providers and other stakeholders as part of the [employer] COC (Continuum of Care). The (COC) is a regional/local planning body that coordinates housing and services funding for homeless families and individuals. The role of [facility] in the COC is through housing counseling to work with people we serve on diversion strategies for those that are at imminent risk of becoming homeless. We also work with the Coordinated Assessment/Entry Specialist who attempts to find temporary solutions and/or connects the people to Housing First, Rapid Re-housing or transitional housing programs. Our Homelessness Prevention Housing Counselors meet  with clients on business days (Monday-Fridays, except scheduled holidays) from 8:30 am to 4:30 pm.  Legal assistance for evictions, foreclosure, and more -If you need free legal advice on civil issues, such as foreclosures, evictions, electronics engineer, government programs, domestic issues and more, Armed Forces Operational Officer Aid of [place]  [facility]) is a associate professor firm that provides free legal services and counsel to lower income people, seniors, disabled, and others, The goal is to ensure everyone has access to justice and fair representation. Call them at [phone].  [facility] for Housing and Community Studies can provide info about obtaining legal assistance with evictions. Phone [phone]. Data Processing Manager  The [employer], [employer]. offers job and dispensing optician. Resources are focused on helping students obtain the skills and experiences that are necessary to compete in today's challenging and tight job market. The non-profit faith-based community action agency offers internship trainings as well as classroom instruction. Classes are tailored to meet the needs of people in the [facility] region. [place], [place] [address], [phone] Foreclosure Prevention/Debt Services Family Services of the [employer] Credit Counseling Service inludes debt and foreclosure prevention programs for local families. This includes money management, financial advice, budget review and development of a written action plan with a pensions consultant to help solve specific individual financial problems. In addition, housing and mortgage counselors can also provide pre- and post-purchase homeownership counseling, default resolution counseling (to prevent foreclosure) and reverse mortgage counseling. A Debt Management Program allows people and families with a high level of credit card or medical debt to consolidate and repay consumer debt and loans to  creditors and rebuild positive credit ratings and scores. Contact (336) D7650557. Community Clinics in [place] -Health Department [facility]: [address] [place], [place], [address]. [phone].  -Health Department [place] [facility]: [address], [place], [address]. [phone].  -[facility] offers medical care through a group of doctors, pharmacies and other  healthcare related agencies that offer services for low income, uninsured adults in [place]. Also offers adult Dental care and assistance with applying for an [employer]. Call [phone].   [facility]. This center provides low-cost health care to those without health insurance. Services offered include an onsite pharmacy. Phone ([phone]. [address], [address], [place].  -Medication Assistance Program serves as a link between pharmaceutical companies and patients to provide low cost or free prescription medications. This service is available for residents who meet certain income restrictions and have no insurance coverage. PLEASE CALL [phone] [place]) OR [phone] ([place])  -One Step Further: Materials Engineer, The [employer] Support & Nutrition Program, [place]. Call [phone]/ [phone].  Food Emergency Planning/management Officer -Urban Ministry-Food Bank: [address], [place] [address]. Phone [phone]  -[facility] Food Pantry: [address], [place], [place] [address]. [phone].  -[place] Food Finder: https://findfood.bargaincontractor.si  FLEEING VIOLENCE  -Family Services of the [place]- 24/7 Crisis line ([phone]) -[facility] Family Justice Centers: (907) [phone]) 641-SAFE [phone])  Transportation  -[facility] for an application. No fee for people over the age of 16. P: 6085077171. Address: [address], [place], [place] [address]  -Senior Wheels Transportation-Age 12 and over. Limit of 1 ride per week for ambulatory participants. Limit 1 ride per month for non-ambulatory participants needing wheelchair transportation. Contact: (336) [phone] ([place]), [phone] [facility]).  -Access [place]: Available for people with disabilities who are unable to use fixed-route bus service. Fee applies. Contact: [phone] (For application requests)/(336) [phone] (Customer service)/(336) [phone] (I-ride reservation line)/(336) [phone] (Access [place] reservation line)   Building Services Engineer.org  Homeless Day Center  -Interactive Resource Center [facility])   Day Center: M-F 8a-3p [address]  [place], [place] [address] [phone] Services include: laundry, barbering, support groups, case management, phone & computer access, showers, AA/NA mtgs, mental health/substance abuse nurse, job skills class, disability information, VA assistance, spiritual classes, etc.        HOMELESS SHELTERS [employer] Shelter at [employer]- Call [phone] ext. [phone] or ext. 336. Located at [address] [place], [place] [address]  [facility] Mens Shelter- Call [phone]. Located at [address] [place] [address].  [facility]- [employer]. Call [phone]. Office located at [address] [place] [address].  [facility] Family Housing through [place] [phone].  [facility] Family Shelter- Call [phone]. Located at [address] [place], [place], [place] [address].  Room at the Inn-For Pregnant mothers. Call [phone]. Located at [address] [place], [address].  [facility]-For men in [place]. Call [phone].  Home of [employer] for [employer] [phone]. Office located at [address], [place], [address].  [employer] be agreeable to help with chores. Call [phone] ext. [phone].  Men's: [address] [place], [place] [address]. Women's: [facility]  [address] [place], [place] [address]   Crisis Services [facility] Mobile Crisis Management- [phone]  [facility] [address] [place], [place] [address]. Phone: [phone]   *[place] 2-1-1 is another useful way to locate resources in the community. Visit shedsizes.ch to find service information online. If you need additional assistance, 2-1-1 Referral Specialists are available 24 hours a day, every day by dialing 2-1-1 or [phone] from  any phone. The call is free, confidential, and available in any language. Social Connections Resources   Active Adults Program https://www.[place].gov/departments/parks-recreation/active-adults-50  -Adding Health to Our Years Courses  -Aquatics  -Exercise Classes: Yoga, Dance, [place], etc.  -Book Club, Games  Locations vary by activity - Main Contact # [phone]) - see calendars on website listed above.  Senior Centers -[facility] [address] [place], [place] [address] / [phone] ext [phone] -[facility] [address], [place] [address] / [phone] Angelene B. [facility]) [address], [place], [place] [address] / [phone] [facility] locations in [place], [place], and [place] / [phone] -[facility] Adult Center [address], [place] [address] / [phone]  Program of All Inclusive Care for the Elderly (PACE) [address] [place], [place] [address] - Office #: [phone] - Enrollment Phone #: [phone]  Institute of Aging Senior Friendship Line  Call toll free, available 24 hours a day, [phone]   [place] [address]  [place] 2-1-1 is another useful way to locate resources in the community. Visit  shedsizes.ch to find service information online. If you need additional assistance, 2-1-1 Referral Specialists are available 24 hours a day, every day by dialing 2-1-1 or [phone] from any phone. The call is free, confidential, and available in any language.

## 2024-03-08 LAB — CBC
HCT: 34.7 % — ABNORMAL LOW (ref 39.0–52.0)
Hemoglobin: 10.4 g/dL — ABNORMAL LOW (ref 13.0–17.0)
MCH: 23.1 pg — ABNORMAL LOW (ref 26.0–34.0)
MCHC: 30 g/dL (ref 30.0–36.0)
MCV: 77.1 fL — ABNORMAL LOW (ref 80.0–100.0)
Platelets: 249 10*3/uL (ref 150–400)
RBC: 4.5 MIL/uL (ref 4.22–5.81)
RDW: 19.4 % — ABNORMAL HIGH (ref 11.5–15.5)
WBC: 6.1 10*3/uL (ref 4.0–10.5)
nRBC: 0 % (ref 0.0–0.2)

## 2024-03-08 LAB — COMPREHENSIVE METABOLIC PANEL WITH GFR
ALT: 12 U/L (ref 0–44)
AST: 17 U/L (ref 15–41)
Albumin: 3.4 g/dL — ABNORMAL LOW (ref 3.5–5.0)
Alkaline Phosphatase: 69 U/L (ref 38–126)
Anion gap: 9 (ref 5–15)
BUN: 17 mg/dL (ref 6–20)
CO2: 32 mmol/L (ref 22–32)
Calcium: 8.8 mg/dL — ABNORMAL LOW (ref 8.9–10.3)
Chloride: 98 mmol/L (ref 98–111)
Creatinine, Ser: 1.41 mg/dL — ABNORMAL HIGH (ref 0.61–1.24)
GFR, Estimated: >60 mL/min
Glucose, Bld: 100 mg/dL — ABNORMAL HIGH (ref 70–99)
Potassium: 3.6 mmol/L (ref 3.5–5.1)
Sodium: 139 mmol/L (ref 135–145)
Total Bilirubin: 0.4 mg/dL (ref 0.0–1.2)
Total Protein: 7.2 g/dL (ref 6.5–8.1)

## 2024-03-08 MED ORDER — FUROSEMIDE 40 MG PO TABS
80.0000 mg | ORAL_TABLET | Freq: Two times a day (BID) | ORAL | Status: DC
  Administered 2024-03-08 – 2024-03-09 (×2): 80 mg via ORAL
  Filled 2024-03-08 (×2): qty 2

## 2024-03-08 MED ORDER — ENOXAPARIN SODIUM 120 MG/0.8ML IJ SOSY
120.0000 mg | PREFILLED_SYRINGE | INTRAMUSCULAR | Status: DC
  Filled 2024-03-08 (×2): qty 0.8

## 2024-03-08 MED ORDER — POTASSIUM CHLORIDE CRYS ER 20 MEQ PO TBCR
40.0000 meq | EXTENDED_RELEASE_TABLET | Freq: Two times a day (BID) | ORAL | Status: AC
  Administered 2024-03-08: 40 meq via ORAL
  Filled 2024-03-08: qty 2

## 2024-03-08 NOTE — Progress Notes (Signed)
" °  Progress Note   Patient: Jared Mccoy FMW:[id number] DOB: 1990-11-12 DOA: 03/05/2024     3 DOS: the patient was seen and examined on 03/08/2024   Brief hospital course: 34 y.o. adult with medical history significant of diastolic CHF, HTN, super morbid obesity who presented to ED with complaints of increased SOB. Pt recently left AMA on 1/4 following admission for acute heart failure exacerbation with concerns that pt was not fully diuresed at that time.  Pt is currently very drowsy and quickly falls back asleep. Difficult to arouse, but pt does wake up with stimulation. Additional detailed history is difficult given lethargy   In the ED, pt was noted to have improved PNA on CXR with evidence of pulm vascular congestion. Pt was noted to be very drowsy. ABG revealed normal pH with pCO2 of 62. Pt was given trial of IV lasix  x 1 and neb tx. Pt required 3LNC.  Assessment and Plan: Acute on chronic D CHF Pulmonary hypertension -Recent ECHO-9/25 w/ EF 65-70,  -Pt recently left AMA for tx of heart failure on 1/4. At that time, pt had been responding well to IV diuresis, but noted to still be overloaded at the time of leaving [facility] -Cardiology following -Of note, pt was previously deemed not appropriate for right heart cath with morbid obesity -Per Cardiology, pt is close to dry wt. Plan to switch from IV to 80mg  bid po lasix    Acute hypoixic resp failure -multifactorial -CHF/OSA/OHS - Not on O2 at baseline, now weaned to RA - Continue CPAP nightly   Recent Pneumonia - Completed abx during last stay -PNA is regressing on CXR   Hypertensive urgency - Will cont hydralazine  75 mg TID yesterday, continue carvedilol , diuretics as per last admit -continue with PRN hydralazine    Chronic lymphedema, chronic skin changes - Has wounds on the posterior aspect - Continue wound care, needs significant weight loss, lymphedema clinic referral - PT/OT consulted   Hx cocaine abuse -every UDS done since  05/21/23 has been pos for cocaine -UDS remains pos for Cocaine       Subjective: no complaints. Feeling better  Physical Exam: Vitals:   03/07/24 2331 03/08/24 0430 03/08/24 0433 03/08/24 0745  BP: (!) 161/61 (!) 144/73 (!) 144/73 (!) 157/86  Pulse: 84 65 71 72  Resp: 18 20  17   Temp: 98.6 F (37 C) 98.4 F (36.9 C)  98.4 F (36.9 C)  TempSrc: Oral Axillary  Oral  SpO2: 95% 92% 95% 96%  Weight:   (!) 246.1 kg   Height:       General exam: Awake, laying in bed, in nad Respiratory system: Normal respiratory effort, no wheezing Cardiovascular system: regular rate, s1, s2 Gastrointestinal system: Soft, nondistended, positive BS Central nervous system: CN2-12 grossly intact, strength intact Extremities: Perfused, no clubbing Skin: Normal skin turgor, no notable skin lesions seen Psychiatry: Mood normal // no visual hallucinations   Data Reviewed:  Labs reviewed: Na 139, K 3.6, Cr 1.41, WBC 6.1, Hgb 10.4, Plts 249  Family Communication: Pt in room, family not at bedside  Disposition: Status is: Inpatient Remains inpatient appropriate because: severity of illness  Planned Discharge Destination: Home     Author: Garnette Pelt, MD 03/08/2024 1:59 PM  For on call review [email or web address]  "

## 2024-03-08 NOTE — Plan of Care (Signed)
   Problem: Education: Goal: Knowledge of General Education information will improve Description: Including pain rating scale, medication(s)/side effects and non-pharmacologic comfort measures Outcome: Progressing   Problem: Clinical Measurements: Goal: Diagnostic test results will improve Outcome: Progressing

## 2024-03-08 NOTE — Progress Notes (Signed)
 "  Progress Note  Patient Name: Jared Mccoy Date of Encounter: 03/08/2024 [facility] Cardiologist: Gordy Bergamo, MD   Interval Summary   Doing well this morning.  No shortness of breath, orthopnea or chest pain.  Diuresing well.  Vital Signs Vitals:   03/07/24 2331 03/08/24 0430 03/08/24 0433 03/08/24 0745  BP: (!) 161/61 (!) 144/73 (!) 144/73 (!) 157/86  Pulse: 84 65 71 72  Resp: 18 20  17   Temp: 98.6 F (37 C) 98.4 F (36.9 C)  98.4 F (36.9 C)  TempSrc: Oral Axillary  Oral  SpO2: 95% 92% 95% 96%  Weight:   (!) 246.1 kg   Height:        Intake/Output Summary (Last 24 hours) at 03/08/2024 0932 Last data filed at 03/07/2024 2200 Gross per 24 hour  Intake 575 ml  Output 2725 ml  Net -2150 ml      03/08/2024    4:33 AM 03/07/2024    4:16 AM 03/06/2024    3:52 AM  Last 3 Weights  Weight (lbs) 542 lb 9.6 oz 550 lb 0.8 oz 550 lb 4.3 oz  Weight (kg) 246.122 kg 249.5 kg 249.6 kg      Telemetry/ECG  Sinus rhythm- Personally Reviewed  Physical Exam  GEN: No acute distress.   Neck: No JVD Cardiac: RRR, no murmurs, rubs, or gallops.  Respiratory: Distant lung sounds but sounds clear to auscultation bilaterally. GI: Soft, nontender, non-distended  MS: Lymphedema, legs are wrapped secondary to wounds, no pitting edema.   Patient Profile Patient with past medical history significant for atrial fibrillation, chronic HFpEF, bilateral lymphedema, BMI greater than 70, OSA, tobacco use, ongoing cocaine use, leg wounds, sepsis, cellulitis, CKD.  -02/2024 admitted for HFpEF exacerbation, left AMA.  Reports he had to get back to his children.  Early readmission for HFpEF exacerbation.  Reports hospital did not give him enough medications and ran out of his Lasix .  Continues to have issues with cocaine abuse.  Assessment & Plan   Acute on chronic HFpEF -10/2023 echocardiogram preserved biventricular function.  Moderate LVH.  No significant valve disease. Seems to be  diuresing well, -3 L.  Volume status very difficult to assess with a BMI greater than 70.  He reports no complaints today though.  I actually suspect that he is close to euvolemia based off of his discal exam and prior weights.  Today's weight 242 pounds.  Also suspect his chronic lymphedema is a big contributor as well. Continue with IV Lasix  80 mg twice daily for today, suspect we could probably transition to p.o. dosing either tonight or tomorrow.  Would like to get NP's input. Right heart catheterization would be very difficult and weight probably prohibitive. Supplementing with potassium 40 mEq twice daily.  Continue spironolactone  50 mg daily. Continue carvedilol  25 mg twice daily and hydralazine  75 mg 3 times daily.  Avoid SGLT2 inhibitors.  Cocaine abuse Persistent issue.  UDS since at least 05/2023 have demonstrated cocaine.  Reported use over 1 month ago but UDS this admission again positive for cocaine.  Encouraged cessation.  Atrial fibrillation One-time episode 07/2023 in the setting of acute cocaine intoxication.  No clear recurrences.  CHA2DS2-VASc has been low so has not been on anticoagulation. NSR here.   CKD Some rise in Cr is expected and appropriate to sufficiently diurese.  GFR is normal.  BMI greater than 70 Strongly encourage weight loss.  Aortic dilatation 40 mm.  Monitor annually.  For questions or updates,  please contact [facility] Please consult [email or web address] for contact info under       Signed, Thom LITTIE Sluder, PA-C    "

## 2024-03-08 NOTE — Plan of Care (Signed)
" °  Pt is alert and fully oriented x 4, able to ambulate independently in his room. He is afebrile, stable hemodynamically, NSR on the monitor, on CPAP at night time, normal respiratory effort, no acute distress. Pt is able to rest and sleep well with no major complaints overnight.   Pt has lymphedema with stage III pressure ulcer wounds posterior tibial bilaterally. Wounds have foul smell with  moderate serous drainage. The wounds cleaned with soap, and new dressing applied with Xerofoam, ABD pad and wrapped with Kirlax.     Right leg:    Left leg:  Plan of care is reviewed. Pt has been progressing. We will continue monitor.       Problem: Clinical Measurements: Goal: Ability to maintain clinical measurements within normal limits will improve Outcome: Progressing Goal: Will remain free from infection Outcome: Progressing Goal: Diagnostic test results will improve Outcome: Progressing Goal: Respiratory complications will improve Outcome: Progressing Goal: Cardiovascular complication will be avoided Outcome: Progressing   Problem: Activity: Goal: Risk for activity intolerance will decrease Outcome: Progressing   Problem: Skin Integrity: Goal: Risk for impaired skin integrity will decrease Outcome: Progressing   Kaiyla Stahly. RN  "

## 2024-03-08 NOTE — TOC CM/SW Note (Signed)
 Transition of Care [facility]) - Inpatient Brief Assessment   Patient Details  Name: Jared Mccoy MRN: [id number] Date of Birth: November 16, 1990  Transition of Care [facility]) CM/SW Contact:    Waddell Barnie Rama, RN Phone Number: 03/08/2024, 4:56 PM   Clinical Narrative: From home with mother, has PCP and insurance on file, states has no HH services in place at this time  but he had [facility] recently for leg wraps once a week and he is also seeing Dr. Harden for wound care, has no DME at home.  States family member (mom)  will transport them home at [employer] and family is support system, states gets medications from [facility].  Pta self ambulatory.   NCM sent referral to [facility] they have accepted.  Soc will begin 24 to 48 hrs post dc.     Transition of Care Asessment: Insurance and Status: Insurance coverage has been reviewed Patient has primary care physician: Yes Home environment has been reviewed: home with mom and kids Prior level of function:: indep Prior/Current Home Services: No current home services Social Drivers of Health Review: SDOH reviewed no interventions necessary Readmission risk has been reviewed: Yes Transition of care needs: transition of care needs identified, TOC will continue to follow

## 2024-03-08 NOTE — Consult Note (Signed)
" °  CLINICAL SUPPORT TEAM - WOUND OSTOMY AND CONTINENCE TEAM  CONSULTATION SERVICES   WOC Nurse-Inpatient Note  WOC Nurse Consult Note: Reason for Consult:Consult requested for bilat legs.  Performed remotely after review of progress notes and photos in the EMR.   We just saw this patient 02/10/24 for same.  Wound type: Bilat legs with generalized lymphadema and scattered partial and fill thickness wounds, red and moist. Pressure Injury POA: NA Measurement: see nursing flowsheets Wound bed: LLE: yellow/100% fibrinous  RLE: 100% pink, clean Drainage (amount, consistency, odor)  Periwound: Dressing procedure/placement/frequency: Topical treatment orders provided for bedside nurses to perform as follows to absorb drainage and promote healing and reduce edema as follows:  Cleanse bilateral LE wounds with saline.  Cut piece of Aquacel Soila # [phone]) and place in/over bilat leg wounds Q day, then cover with foam dressings.  Wrap legs from toes to knees with kerlix followed by ACE wraps. Change daily.   Re consult if needed, will not follow at this time. Thanks  Emilea Goga [employer], RN,CWOCN, CNS, MAINE [phone]     "

## 2024-03-09 ENCOUNTER — Other Ambulatory Visit (HOSPITAL_COMMUNITY): Payer: Self-pay

## 2024-03-09 DIAGNOSIS — I10 Essential (primary) hypertension: Secondary | ICD-10-CM

## 2024-03-09 LAB — COMPREHENSIVE METABOLIC PANEL WITH GFR
ALT: 12 U/L (ref 0–44)
AST: 17 U/L (ref 15–41)
Albumin: 3.5 g/dL (ref 3.5–5.0)
Alkaline Phosphatase: 71 U/L (ref 38–126)
Anion gap: 9 (ref 5–15)
BUN: 19 mg/dL (ref 6–20)
CO2: 31 mmol/L (ref 22–32)
Calcium: 9 mg/dL (ref 8.9–10.3)
Chloride: 100 mmol/L (ref 98–111)
Creatinine, Ser: 1.47 mg/dL — ABNORMAL HIGH (ref 0.61–1.24)
GFR, Estimated: >60 mL/min
Glucose, Bld: 87 mg/dL (ref 70–99)
Potassium: 3.8 mmol/L (ref 3.5–5.1)
Sodium: 140 mmol/L (ref 135–145)
Total Bilirubin: 0.5 mg/dL (ref 0.0–1.2)
Total Protein: 7.4 g/dL (ref 6.5–8.1)

## 2024-03-09 MED ORDER — CARVEDILOL 25 MG PO TABS
25.0000 mg | ORAL_TABLET | Freq: Two times a day (BID) | ORAL | 0 refills | Status: AC
  Filled 2024-03-09: qty 60, 30d supply, fill #0

## 2024-03-09 MED ORDER — FUROSEMIDE 80 MG PO TABS
80.0000 mg | ORAL_TABLET | Freq: Two times a day (BID) | ORAL | 0 refills | Status: AC
  Filled 2024-03-09: qty 60, 30d supply, fill #0

## 2024-03-09 MED ORDER — POTASSIUM CHLORIDE CRYS ER 20 MEQ PO TBCR
40.0000 meq | EXTENDED_RELEASE_TABLET | Freq: Once | ORAL | Status: AC
  Administered 2024-03-09: 40 meq via ORAL
  Filled 2024-03-09: qty 2

## 2024-03-09 MED ORDER — HYDRALAZINE HCL 25 MG PO TABS
75.0000 mg | ORAL_TABLET | Freq: Three times a day (TID) | ORAL | 0 refills | Status: AC
  Filled 2024-03-09: qty 270, 30d supply, fill #0

## 2024-03-09 NOTE — Discharge Summary (Signed)
 " Physician Discharge Summary   Patient: Jared Mccoy MRN: [id number] DOB: Oct 12, 1990  Admit date:     03/05/2024  Discharge date: 03/09/24  Discharge Physician: Jared Mccoy   PCP: Jared Rosaline SQUIBB, NP   Recommendations at discharge:    Follow up with PCP in 1-2 weeks Follow up with Cardiology as scheduled  Discharge Diagnoses: Principal Problem:   CHF (congestive heart failure) (HCC) Active Problems:   Acute on chronic diastolic heart failure (HCC)  Resolved Problems:   * No resolved hospital problems. *  Hospital Course: 34 y.o. adult with medical history significant of diastolic CHF, HTN, super morbid obesity who presented to ED with complaints of increased SOB. Pt recently left AMA on 1/4 following admission for acute heart failure exacerbation with concerns that pt was not fully diuresed at that time.  Pt is currently very drowsy and quickly falls back asleep. Difficult to arouse, but pt does wake up with stimulation. Additional detailed history is difficult given lethargy   In the ED, pt was noted to have improved PNA on CXR with evidence of pulm vascular congestion. Pt was noted to be very drowsy. ABG revealed normal pH with pCO2 of 62. Pt was given trial of IV lasix  x 1 and neb tx. Pt required 3LNC.  Assessment and Plan: Acute on chronic D CHF Pulmonary hypertension -Recent ECHO-9/25 w/ EF 65-70,  -Pt recently left AMA for tx of heart failure on 1/4. At that time, pt had been responding well to IV diuresis, but noted to still be overloaded at the time of leaving [facility] -Cardiology following -Of note, pt was previously deemed not appropriate for right heart cath with morbid obesity -Per Cardiology, pt is close to dry wt. Cardiology has since signed off -Pt to continue lasix  80mg  po bid, coreg  25mg , and spironolactone  50mg  on d/c   Acute hypoixic resp failure -multifactorial -CHF/OSA/OHS - Not on O2 at baseline, now weaned to RA - Continue CPAP nightly   Recent  Pneumonia - Completed abx during last stay -PNA is regressing on CXR   Hypertensive urgency - continued hydralazine  75 mg TID, carvedilol , diuretics as per last admit    Chronic lymphedema, chronic skin changes - Has wounds on the posterior aspect - Continue wound care, needs significant weight loss, lymphedema clinic referral - PT/OT consulted   Hx cocaine abuse -every UDS done since 05/21/23 has been pos for cocaine -UDS remains pos for Cocaine  Subjective Eager to go home       Consultants: Cardiology Procedures performed:   Disposition: Home Diet recommendation:  Cardiac diet DISCHARGE MEDICATION: Allergies as of 03/09/2024       Reactions   Crab (diagnostic) Diarrhea, Nausea And Vomiting   No issues with other shellfish - ONLY crab legs        Medication List     STOP taking these medications    amLODipine  10 MG tablet Commonly known as: NORVASC    irbesartan  150 MG tablet Commonly known as: AVAPRO        TAKE these medications    acetaminophen  500 MG tablet Commonly known as: TYLENOL  Take 1,000 mg by mouth 2 (two) times daily as needed for headache or fever (pain).   carvedilol  25 MG tablet Commonly known as: COREG  Take 1 tablet (25 mg total) by mouth 2 (two) times daily with a meal.   furosemide  80 MG tablet Commonly known as: Lasix  Take 1 tablet (80 mg total) by mouth 2 (two) times daily.   hydrALAZINE   25 MG tablet Commonly known as: APRESOLINE  Take 3 tablets (75 mg total) by mouth every 8 (eight) hours. What changed:  medication strength how much to take   spironolactone  50 MG tablet Commonly known as: ALDACTONE  Take 1 tablet (50 mg total) by mouth daily.        Contact information for follow-up providers     [facility] Follow up on 03/15/2024.   Specialty: Family Medicine Why: 10:10 please arrive 15 minutes early Contact information: [address] [place]   [address] [phone] Additional information: [address]  [address]  [place], [place] [address]             Contact information for after-discharge care     Home Medical Care     [facility]) .   Service: Home Health Services Why: Agency will call you to set up apt times Contact information: [address] [place] [address] [facility] [place]  [address] [phone]                    Discharge Exam: Jared Mccoy   03/07/24 0416 03/08/24 0433 03/09/24 0326  Weight: (!) 249.5 kg (!) 246.1 kg (!) 245.1 kg   General exam: Awake, laying in bed, in nad Respiratory system: Normal respiratory effort, no wheezing Cardiovascular system: regular rate, s1, s2 Gastrointestinal system: Soft, nondistended, positive BS Central nervous system: CN2-12 grossly intact, strength intact Extremities: Perfused, no clubbing Skin: Normal skin turgor, no notable skin lesions seen Psychiatry: Mood normal // no visual hallucinations   Condition at discharge: fair  The results of significant diagnostics from this hospitalization (including imaging, microbiology, ancillary and laboratory) are listed below for reference.   Imaging Studies: DG Chest Port 1 View Result Date: 03/05/2024 EXAM: 1 VIEW XRAY OF THE CHEST 03/05/2024 03:46:07 AM COMPARISON: 02/08/2024 CLINICAL HISTORY: 34 year old male with shortness of breath. FINDINGS: LUNGS AND PLEURA: Low lung volumes. Increased interstitial prominence. Lower lobe consolidation appears regressed since last month, but probably not fully resolved. No new confluent lung opacity. No pleural effusion. No pneumothorax. HEART AND MEDIASTINUM: Unchanged cardiomegaly. No acute abnormality of the mediastinal silhouette. BONES AND SOFT TISSUES: No osseous abnormality. IMPRESSION: 1. Right lower lobe consolidation probably regressed since December but not fully resolved. No new confluent lung opacity. Ongoing low lung  volumes. 2. Increased pulmonary vascular congestion. Pulmonary interstitial edema not excluded. Electronically signed by: Helayne Hurst MD 03/05/2024 03:59 AM EST RP Workstation: [id number]   CT Angio Chest PE W and/or Wo Contrast Result Date: 02/08/2024 EXAM: CTA CHEST 02/08/2024 02:47:02 PM TECHNIQUE: CTA of the chest was performed without and with the administration of 110 mL of iohexol  (OMNIPAQUE ) 350 MG/ML injection. Multiplanar reformatted images are provided for review. MIP images are provided for review. Automated exposure control, iterative reconstruction, and/or weight based adjustment of the mA/kV was utilized to reduce the radiation dose to as low as reasonably achievable. COMPARISON: None available. CLINICAL HISTORY: Pulmonary embolism (PE) suspected, high prob; sob. FINDINGS: PULMONARY ARTERIES: Pulmonary arteries are adequately opacified for evaluation. No central or segmental pulmonary embolus. Limited evaluation of the subsegmental level due to timing of contrast. The main pulmonary artery is enlarged, measuring up to 3.6 cm. MEDIASTINUM: The heart and pericardium demonstrate no acute abnormality. The thoracic aorta is normal in caliber. LYMPH NODES: Enlarged right hilar lymph node measuring up to 1.7 cm. No mediastinal or axillary lymphadenopathy. LUNGS AND PLEURA: Right lower lobe or  bronchovascular and basilar hypodense consolidation. Patchy right upper lobe and right lower lobe airspace opacities. No evidence of pleural effusion or pneumothorax. UPPER ABDOMEN: Limited images of the upper abdomen are unremarkable. SOFT TISSUES AND BONES: No acute bone or soft tissue abnormality. IMPRESSION: 1. No central or segmental pulmonary embolus. Limited evaluation at the subsegmental level due to contrast timing. 2. Multifocal pneumonia of the right lower and upper lobes. Recommend follow-up CT in 3 months to evaluate for complete resolution. 3. Right hilar lymphadenopathy that may be reactive in  etiology - recommend attention on follow-up. 4. Enlarged main pulmonary artery - correlate for pulmonary hypertension. Electronically signed by: Morgane Naveau MD 02/08/2024 04:37 PM EST RP Workstation: [id number]    Microbiology: Results for orders placed or performed during the hospital encounter of 02/08/24  MRSA Next Gen by PCR, Nasal     Status: None   Collection Time: 02/09/24  2:56 PM   Specimen: Nasal Mucosa; Nasal Swab  Result Value Ref Range Status   MRSA by PCR Next Gen NOT DETECTED NOT DETECTED Final    Comment: (NOTE) The GeneXpert MRSA Assay (FDA approved for NASAL specimens only), is one component of a comprehensive MRSA colonization surveillance program. It is not intended to diagnose MRSA infection nor to guide or monitor treatment for MRSA infections. Test performance is not FDA approved in patients less than 64 years old. Performed at [facility], [address] [place], [place] [address]   Expectorated Sputum Assessment w Gram Stain, Rflx to Resp Cult     Status: None   Collection Time: 02/10/24  2:35 PM   Specimen: Expectorated Sputum  Result Value Ref Range Status   Specimen Description EXPECTORATED SPUTUM  Final   Special Requests NONE  Final   Sputum evaluation   Final    THIS SPECIMEN IS ACCEPTABLE FOR SPUTUM CULTURE Performed at [facility], [address] [place], [place] [address]    Report Status 02/10/2024 FINAL  Final  Culture, Respiratory w Gram Stain     Status: None   Collection Time: 02/10/24  2:35 PM  Result Value Ref Range Status   Specimen Description EXPECTORATED SPUTUM  Final   Special Requests NONE Reflexed from [id number]  Final   Gram Stain   Final    MODERATE WBC PRESENT,BOTH PMN AND MONONUCLEAR FEW GRAM POSITIVE COCCI RARE GRAM NEGATIVE RODS    Culture   Final    FEW Normal respiratory flora-no Staph aureus or Pseudomonas seen Performed at [facility], [address], [place], [place] [address]    Report  Status 02/13/2024 FINAL  Final    Labs: CBC: Recent Labs  Lab 03/05/24 0351 03/05/24 0356 03/05/24 0746 03/06/24 0225 03/08/24 0004  WBC 6.6  --   --  6.6 6.1  NEUTROABS 3.9  --   --   --   --   HGB 10.5* 11.9* 12.2* 10.7* 10.4*  HCT 35.5* 35.0* 36.0* 36.6* 34.7*  MCV 79.1*  --   --  79.2* 77.1*  PLT 227  --   --  233 249   Basic Metabolic Panel: Recent Labs  Lab 03/05/24 1657 03/06/24 0225 03/07/24 0215 03/08/24 0004 03/09/24 0224  NA 141 141 141 139 140  K 3.3* 3.4* 3.6 3.6 3.8  CL 98 98 100 98 100  CO2 36* 32 34* 32 31  GLUCOSE 104* 97 93 100* 87  BUN 12 13 17 17 19   CREATININE 1.22 1.29* 1.34* 1.41*  1.47*  CALCIUM 8.8* 8.7* 8.9 8.8* 9.0  MG  --   --  2.0  --   --    Liver Function Tests: Recent Labs  Lab 03/05/24 0351 03/06/24 0225 03/08/24 0004 03/09/24 0224  AST 20 18 17 17   ALT 15 13 12 12   ALKPHOS 78 75 69 71  BILITOT 0.4 0.4 0.4 0.5  PROT 7.5 7.4 7.2 7.4  ALBUMIN  3.7 3.5 3.4* 3.5   CBG: No results for input(s): GLUCAP in the last 168 hours.  Discharge time spent: less than 30 minutes.  Signed: Garnette Pelt, MD [facility] 03/09/2024 "

## 2024-03-09 NOTE — Progress Notes (Signed)
 Heart Failure Navigator Progress Note  Assessed for Heart & Vascular TOC clinic readiness.  Patient does not meet criteria due to EF 65-70%, has a scheduled [facility] appointment on 03/23/2024. No HF TOC . SABRA   Navigator available for reassessment of patient.   Stephane Haddock, BSN, Scientist, Clinical (histocompatibility And Immunogenetics) Only

## 2024-03-09 NOTE — TOC Transition Note (Signed)
 Transition of Care [facility]) - Discharge Note   Patient Details  Name: Jared Mccoy MRN: [id number] Date of Birth: Oct 31, 1990  Transition of Care [facility]) CM/SW Contact:  Waddell Barnie Rama, RN Phone Number: 03/09/2024, 1:33 PM   Clinical Narrative:    For dc today, he is set up with [facility] for [facility] for leg wraps and he also goes to see Dr. Harden for wound care per patient.  His mom will transport him home today.         Patient Goals and CMS Choice            Discharge Placement                       Discharge Plan and Services Additional resources added to the After Visit Summary for                                       Social Drivers of Health (SDOH) Interventions SDOH Screenings   Food Insecurity: No Food Insecurity (03/05/2024)  Housing: High Risk (03/05/2024)  Transportation Needs: No Transportation Needs (03/05/2024)  Utilities: Not At Risk (03/05/2024)  Depression (PHQ2-9): Low Risk (01/13/2024)  Financial Resource Strain: Medium Risk (11/07/2023)  Social Connections: Socially Isolated (02/09/2024)  Tobacco Use: High Risk (03/05/2024)     Readmission Risk Interventions    03/08/2024    4:54 PM 10/29/2023   12:27 PM  Readmission Risk Prevention Plan  Transportation Screening Complete   PCP or Specialist Appt within 5-7 Days Complete   PCP or Specialist Appt within 3-5 Days  Complete  Home Care Screening Complete   Medication Review (RN CM) Complete   HRI or Home Care Consult  Complete  Social Work Consult for Recovery Care Planning/Counseling  Complete  Palliative Care Screening  Not Applicable

## 2024-03-09 NOTE — Plan of Care (Signed)
  Problem: Education: Goal: Knowledge of General Education information will improve Description: Including pain rating scale, medication(s)/side effects and non-pharmacologic comfort measures Outcome: Progressing   Problem: Health Behavior/Discharge Planning: Goal: Ability to manage health-related needs will improve Outcome: Progressing   Problem: Clinical Measurements: Goal: Respiratory complications will improve Outcome: Progressing Goal: Cardiovascular complication will be avoided Outcome: Progressing   Problem: Skin Integrity: Goal: Risk for impaired skin integrity will decrease Outcome: Progressing

## 2024-03-09 NOTE — Progress Notes (Signed)
 "  Progress Note  Patient Name: Jared Mccoy Date of Encounter: 03/09/2024 [facility] Cardiologist: Gordy Bergamo, MD   Interval Summary    Doing well on p.o. diuretics.  No acute complaints.  He is wanting to discharge.  Vital Signs Vitals:   03/09/24 0005 03/09/24 0326 03/09/24 0600 03/09/24 0735  BP: (!) 141/78 (!) 149/82 (!) 162/105 (!) 151/85  Pulse:  77  86  Resp: 18 18  16   Temp: 98.5 F (36.9 C) 98.2 F (36.8 C)  98 F (36.7 C)  TempSrc: Oral Oral  Oral  SpO2:  100%  92%  Weight:  (!) 245.1 kg    Height:        Intake/Output Summary (Last 24 hours) at 03/09/2024 1122 Last data filed at 03/09/2024 0817 Gross per 24 hour  Intake 720 ml  Output --  Net 720 ml      03/09/2024    3:26 AM 03/08/2024    4:33 AM 03/07/2024    4:16 AM  Last 3 Weights  Weight (lbs) 540 lb 5.6 oz 542 lb 9.6 oz 550 lb 0.8 oz  Weight (kg) 245.1 kg 246.122 kg 249.5 kg      Telemetry/ECG  Sinus rhythm- Personally Reviewed  Physical Exam  GEN: No acute distress.   Neck: No JVD Cardiac: RRR, no murmurs, rubs, or gallops.  Respiratory: Distant lung sounds but sounds clear to auscultation bilaterally. GI: Soft, nontender, non-distended  MS: Lymphedema, legs are wrapped secondary to wounds, no pitting edema.   Patient Profile Patient with past medical history significant for atrial fibrillation, chronic HFpEF, bilateral lymphedema, BMI greater than 70, OSA, tobacco use, ongoing cocaine use, leg wounds, sepsis, cellulitis, CKD.  -02/2024 admitted for HFpEF exacerbation, left AMA.  Reports he had to get back to his children.  Early readmission for HFpEF exacerbation.  Reports hospital did not give him enough medications and ran out of his Lasix .  Continues to have issues with cocaine abuse.  Assessment & Plan   Acute on chronic HFpEF -10/2023 echocardiogram preserved biventricular function.  Moderate LVH.  No significant valve disease.  Output does not look like it has been  documented accurately today however, weight has continue to show decline.  Volume status very difficult to assess with a BMI greater than 70.  He reports well overall, would like to discharge which I think is reasonable.  Weight today is 540 pounds.  Continue p.o. Lasix  80 mg twice daily.  Take extra dose for added weight gain/congestion symptoms. Right heart catheterization would be very difficult and weight probably prohibitive. Continue spironolactone  50 mg daily. Continue carvedilol  25 mg twice daily and hydralazine  75 mg 3 times daily.  Avoid SGLT2 inhibitors.  Titrate hydralazine  as needed for blood pressure.  Cocaine abuse Persistent issue.  UDS since at least 05/2023 have demonstrated cocaine.  Reported use over 1 month ago but UDS this admission again positive for cocaine.  Encouraged cessation.  Atrial fibrillation One-time episode 07/2023 in the setting of acute cocaine intoxication.  No clear recurrences.  CHA2DS2-VASc has been low so has not been on anticoagulation. NSR here.   CKD Some rise in Cr is expected and appropriate to sufficiently diurese.  GFR is normal.    BMI greater than 70 Strongly encourage weight loss.  Aortic dilatation 40 mm.  Monitor annually.  Will arrange follow-up.  For questions or updates, please contact [facility] Please consult [email or web address] for contact info under  Signed, Thom LITTIE Sluder, PA-C    "

## 2024-03-10 ENCOUNTER — Telehealth: Payer: Self-pay | Admitting: *Deleted

## 2024-03-10 NOTE — Transitions of Care (Post Inpatient/ED Visit) (Signed)
" ° °  03/10/2024  Name: Jared Mccoy MRN: [id number] DOB: 04-27-1990  Today's TOC FU Call Status: Today's TOC FU Call Status:: Successful TOC FU Call Completed TOC FU Call Complete Date: 03/10/24  Patient's Name and Date of Birth confirmed. Name, DOB  Transition Care Management Follow-up Telephone Call Date of Discharge: 03/09/24 Discharge Facility: [facility]) Type of Discharge: Inpatient Admission Primary Inpatient Discharge Diagnosis:: CHF How have you been since you were released from the hospital?: Better Any questions or concerns?: No  Items Reviewed: Did you receive and understand the discharge instructions provided?: Yes Medications obtained,verified, and reconciled?: Yes (Medications Reviewed) Any new allergies since your discharge?: No Dietary orders reviewed?: Yes Type of Diet Ordered:: Heart Healthy Do you have support at home?: Yes People in Home [RPT]: sibling(s) Name of Support/Comfort Primary Source: Sister/Jamesha  Medications Reviewed Today: Medications Reviewed Today     Reviewed by Lucky Andrea LABOR, RN (Registered Nurse) on 03/10/24 at [phone]  Med List Status: <None>   Medication Order Taking? Sig Documenting Provider Last Dose Status Informant  acetaminophen  (TYLENOL ) 500 MG tablet [id number]  Take 1,000 mg by mouth 2 (two) times daily as needed for headache or fever (pain).  Patient not taking: Reported on 03/10/2024   [provider]  Active Self, Pharmacy Records  carvedilol  (COREG ) 25 MG tablet [id number] Yes Take 1 tablet (25 mg total) by mouth 2 (two) times daily with a meal. Cindy Garnette POUR, MD  Active   furosemide  (LASIX ) 80 MG tablet [id number] Yes Take 1 tablet (80 mg total) by mouth 2 (two) times daily. Cindy Garnette POUR, MD  Active   hydrALAZINE  (APRESOLINE ) 25 MG tablet [id number] Yes Take 3 tablets (75 mg total) by mouth every 8 (eight) hours. Cindy Garnette POUR, MD  Active   spironolactone  (ALDACTONE ) 50 MG tablet [id number] Yes Take 1 tablet  (50 mg total) by mouth daily. Fairy Frames, MD  Active Self, Pharmacy Records            Home Care and Equipment/Supplies: Were Home Health Services Ordered?: Yes Name of Home Health Agency:: [facility] RN for leg wraps Has Agency set up a time to come to your home?: No EMR reviewed for Home Health Orders: Orders present/patient has not received call (refer to CM for follow-up) (RNCM contacted [facility], agency will contact patient for [facility] Any new equipment or medical supplies ordered?: No  Functional Questionnaire: Do you need assistance with bathing/showering or dressing?: No Do you need assistance with meal preparation?: No Do you need assistance with eating?: No Do you have difficulty maintaining continence: No Do you need assistance with getting out of bed/getting out of a chair/moving?: No Do you have difficulty managing or taking your medications?: No  Follow up appointments reviewed: PCP Follow-up appointment confirmed?: Yes Date of PCP follow-up appointment?: 03/15/24 Follow-up Provider: EMERSON Bohr, NP Specialist Hospital Follow-up appointment confirmed?: Yes Date of Specialist follow-up appointment?: 03/23/24 Follow-Up Specialty Provider:: Cardiology Do you need transportation to your follow-up appointment?: No Do you understand care options if your condition(s) worsen?: Yes-patient verbalized understanding  SDOH Interventions Today    Flowsheet Row Most Recent Value  SDOH Interventions   Food Insecurity Interventions Intervention Not Indicated  Housing Interventions Intervention Not Indicated  Transportation Interventions Intervention Not Indicated  Utilities Interventions Intervention Not Indicated    Andrea Lucky RN, BSN [place]  [facility] RN Care Manager [phone]  "

## 2024-03-11 NOTE — Telephone Encounter (Signed)
 I called the patient.  He came home from the hospital 2 days ago and he said he is feeling better.  He has not contacted [employer] about have his CPAP checked and he said he will call them today.  He stated he has their phone number and did not need me to give it to him, I urged him to call to get the machine either repaired or replaced and he sais he would call.

## 2024-03-12 ENCOUNTER — Telehealth: Payer: Self-pay | Admitting: Primary Care

## 2024-03-12 NOTE — Telephone Encounter (Signed)
 Called pt to remind them about appt. Pt did not answer and VM was left. Please advise

## 2024-03-15 ENCOUNTER — Telehealth: Payer: Self-pay

## 2024-03-15 ENCOUNTER — Inpatient Hospital Stay: Admitting: Primary Care

## 2024-03-15 ENCOUNTER — Ambulatory Visit (INDEPENDENT_AMBULATORY_CARE_PROVIDER_SITE_OTHER): Payer: Self-pay | Admitting: Primary Care

## 2024-03-15 NOTE — Telephone Encounter (Signed)
 Contacted pt to cancel and reschedule appt due to the weather pt didn't answer lvm  If pt calls back please reschedule

## 2024-03-23 ENCOUNTER — Ambulatory Visit: Admitting: Cardiology

## 2024-03-25 ENCOUNTER — Ambulatory Visit: Admitting: Orthopedic Surgery

## 2024-04-13 ENCOUNTER — Telehealth: Payer: Self-pay | Admitting: *Deleted
